# Patient Record
Sex: Female | Born: 1965 | Race: Black or African American | Hispanic: No | Marital: Single | Smoking: Former smoker
Health system: Southern US, Community
[De-identification: ages and names within clinical notes are randomized; demographics above are authoritative.]

## PROBLEM LIST (undated history)

## (undated) ENCOUNTER — Emergency Department (HOSPITAL_BASED_OUTPATIENT_CLINIC_OR_DEPARTMENT_OTHER): Admission: EM | Payer: Medicaid Other | Source: Home / Self Care

## (undated) DIAGNOSIS — K5909 Other constipation: Secondary | ICD-10-CM

## (undated) DIAGNOSIS — G5603 Carpal tunnel syndrome, bilateral upper limbs: Secondary | ICD-10-CM

## (undated) DIAGNOSIS — I10 Essential (primary) hypertension: Secondary | ICD-10-CM

## (undated) DIAGNOSIS — Z87891 Personal history of nicotine dependence: Secondary | ICD-10-CM

## (undated) DIAGNOSIS — L709 Acne, unspecified: Secondary | ICD-10-CM

## (undated) DIAGNOSIS — Z7251 High risk heterosexual behavior: Secondary | ICD-10-CM

## (undated) DIAGNOSIS — L0292 Furuncle, unspecified: Secondary | ICD-10-CM

## (undated) DIAGNOSIS — K219 Gastro-esophageal reflux disease without esophagitis: Secondary | ICD-10-CM

## (undated) DIAGNOSIS — N760 Acute vaginitis: Secondary | ICD-10-CM

## (undated) DIAGNOSIS — N390 Urinary tract infection, site not specified: Secondary | ICD-10-CM

## (undated) DIAGNOSIS — L0293 Carbuncle, unspecified: Secondary | ICD-10-CM

## (undated) DIAGNOSIS — F32A Depression, unspecified: Secondary | ICD-10-CM

## (undated) DIAGNOSIS — B9689 Other specified bacterial agents as the cause of diseases classified elsewhere: Secondary | ICD-10-CM

## (undated) DIAGNOSIS — E785 Hyperlipidemia, unspecified: Secondary | ICD-10-CM

## (undated) DIAGNOSIS — K644 Residual hemorrhoidal skin tags: Secondary | ICD-10-CM

## (undated) DIAGNOSIS — F1411 Cocaine abuse, in remission: Secondary | ICD-10-CM

## (undated) HISTORY — PX: TUBAL LIGATION: SHX77

## (undated) HISTORY — DX: Urinary tract infection, site not specified: N39.0

## (undated) HISTORY — DX: Depression, unspecified: F32.A

## (undated) HISTORY — DX: Cocaine abuse, in remission: F14.11

## (undated) HISTORY — DX: Residual hemorrhoidal skin tags: K64.4

## (undated) HISTORY — DX: Furuncle, unspecified: L02.92

## (undated) HISTORY — DX: Hyperlipidemia, unspecified: E78.5

## (undated) HISTORY — DX: Personal history of nicotine dependence: Z87.891

## (undated) HISTORY — DX: Carbuncle, unspecified: L02.93

## (undated) HISTORY — DX: Other specified bacterial agents as the cause of diseases classified elsewhere: B96.89

## (undated) HISTORY — DX: Carpal tunnel syndrome, bilateral upper limbs: G56.03

## (undated) HISTORY — PX: KNEE ARTHROSCOPY: SHX127

## (undated) HISTORY — PX: COLONOSCOPY: SHX174

## (undated) HISTORY — DX: Other specified bacterial agents as the cause of diseases classified elsewhere: N76.0

## (undated) HISTORY — DX: Other constipation: K59.09

## (undated) HISTORY — DX: High risk heterosexual behavior: Z72.51

## (undated) HISTORY — DX: Essential (primary) hypertension: I10

## (undated) HISTORY — DX: Acne, unspecified: L70.9

---

## 1990-01-15 DIAGNOSIS — F1411 Cocaine abuse, in remission: Secondary | ICD-10-CM

## 1990-01-15 HISTORY — DX: Cocaine abuse, in remission: F14.11

## 1997-01-15 DIAGNOSIS — Z87891 Personal history of nicotine dependence: Secondary | ICD-10-CM

## 1997-01-15 HISTORY — DX: Personal history of nicotine dependence: Z87.891

## 1997-06-01 ENCOUNTER — Ambulatory Visit (HOSPITAL_COMMUNITY): Admission: RE | Admit: 1997-06-01 | Discharge: 1997-06-01 | Payer: Self-pay | Admitting: Family Medicine

## 1997-09-06 ENCOUNTER — Ambulatory Visit (HOSPITAL_COMMUNITY): Admission: RE | Admit: 1997-09-06 | Discharge: 1997-09-06 | Payer: Self-pay | Admitting: Gynecology

## 1997-09-17 ENCOUNTER — Ambulatory Visit (HOSPITAL_COMMUNITY): Admission: RE | Admit: 1997-09-17 | Discharge: 1997-09-17 | Payer: Self-pay | Admitting: Gynecology

## 1997-10-14 ENCOUNTER — Ambulatory Visit (HOSPITAL_COMMUNITY): Admission: RE | Admit: 1997-10-14 | Discharge: 1997-10-14 | Payer: Self-pay | Admitting: Family Medicine

## 1998-01-05 ENCOUNTER — Ambulatory Visit (HOSPITAL_COMMUNITY): Admission: RE | Admit: 1998-01-05 | Discharge: 1998-01-05 | Payer: Self-pay | Admitting: Family Medicine

## 1998-01-10 ENCOUNTER — Emergency Department (HOSPITAL_COMMUNITY): Admission: EM | Admit: 1998-01-10 | Discharge: 1998-01-10 | Payer: Self-pay | Admitting: Emergency Medicine

## 1998-02-10 ENCOUNTER — Encounter: Payer: Self-pay | Admitting: Family Medicine

## 1998-02-10 ENCOUNTER — Ambulatory Visit (HOSPITAL_COMMUNITY): Admission: RE | Admit: 1998-02-10 | Discharge: 1998-02-10 | Payer: Self-pay | Admitting: Family Medicine

## 1998-02-24 ENCOUNTER — Ambulatory Visit (HOSPITAL_BASED_OUTPATIENT_CLINIC_OR_DEPARTMENT_OTHER): Admission: RE | Admit: 1998-02-24 | Discharge: 1998-02-24 | Payer: Self-pay | Admitting: *Deleted

## 1999-01-25 ENCOUNTER — Encounter: Payer: Self-pay | Admitting: Emergency Medicine

## 1999-01-25 ENCOUNTER — Emergency Department (HOSPITAL_COMMUNITY): Admission: EM | Admit: 1999-01-25 | Discharge: 1999-01-25 | Payer: Self-pay | Admitting: Emergency Medicine

## 1999-05-15 ENCOUNTER — Other Ambulatory Visit: Admission: RE | Admit: 1999-05-15 | Discharge: 1999-05-15 | Payer: Self-pay | Admitting: Family Medicine

## 2001-04-07 ENCOUNTER — Emergency Department (HOSPITAL_COMMUNITY): Admission: EM | Admit: 2001-04-07 | Discharge: 2001-04-08 | Payer: Self-pay | Admitting: Emergency Medicine

## 2001-04-08 ENCOUNTER — Emergency Department (HOSPITAL_COMMUNITY): Admission: EM | Admit: 2001-04-08 | Discharge: 2001-04-09 | Payer: Self-pay | Admitting: *Deleted

## 2001-07-14 ENCOUNTER — Other Ambulatory Visit: Admission: RE | Admit: 2001-07-14 | Discharge: 2001-07-14 | Payer: Self-pay | Admitting: Family Medicine

## 2002-02-21 ENCOUNTER — Emergency Department (HOSPITAL_COMMUNITY): Admission: EM | Admit: 2002-02-21 | Discharge: 2002-02-21 | Payer: Self-pay | Admitting: Emergency Medicine

## 2002-02-22 ENCOUNTER — Emergency Department (HOSPITAL_COMMUNITY): Admission: EM | Admit: 2002-02-22 | Discharge: 2002-02-22 | Payer: Self-pay | Admitting: Emergency Medicine

## 2003-02-24 ENCOUNTER — Ambulatory Visit (HOSPITAL_COMMUNITY): Admission: RE | Admit: 2003-02-24 | Discharge: 2003-02-24 | Payer: Self-pay | Admitting: Family Medicine

## 2003-10-13 ENCOUNTER — Ambulatory Visit: Payer: Self-pay | Admitting: Family Medicine

## 2003-10-20 ENCOUNTER — Ambulatory Visit (HOSPITAL_COMMUNITY): Admission: RE | Admit: 2003-10-20 | Discharge: 2003-10-20 | Payer: Self-pay | Admitting: Family Medicine

## 2003-11-01 ENCOUNTER — Ambulatory Visit: Payer: Self-pay | Admitting: Family Medicine

## 2004-01-19 ENCOUNTER — Ambulatory Visit: Payer: Self-pay | Admitting: Internal Medicine

## 2004-01-21 ENCOUNTER — Ambulatory Visit: Payer: Self-pay | Admitting: Internal Medicine

## 2004-01-29 ENCOUNTER — Emergency Department (HOSPITAL_COMMUNITY): Admission: EM | Admit: 2004-01-29 | Discharge: 2004-01-29 | Payer: Self-pay | Admitting: Emergency Medicine

## 2004-02-16 ENCOUNTER — Emergency Department (HOSPITAL_COMMUNITY): Admission: EM | Admit: 2004-02-16 | Discharge: 2004-02-16 | Payer: Self-pay | Admitting: Family Medicine

## 2004-03-30 ENCOUNTER — Emergency Department (HOSPITAL_COMMUNITY): Admission: EM | Admit: 2004-03-30 | Discharge: 2004-03-30 | Payer: Self-pay | Admitting: Family Medicine

## 2004-04-11 ENCOUNTER — Ambulatory Visit: Payer: Self-pay | Admitting: Internal Medicine

## 2004-05-05 ENCOUNTER — Emergency Department (HOSPITAL_COMMUNITY): Admission: EM | Admit: 2004-05-05 | Discharge: 2004-05-05 | Payer: Self-pay | Admitting: Family Medicine

## 2004-06-06 ENCOUNTER — Ambulatory Visit: Payer: Self-pay | Admitting: Internal Medicine

## 2004-07-03 ENCOUNTER — Ambulatory Visit: Payer: Self-pay | Admitting: Internal Medicine

## 2004-09-25 ENCOUNTER — Ambulatory Visit: Payer: Self-pay | Admitting: Internal Medicine

## 2004-10-02 ENCOUNTER — Ambulatory Visit: Payer: Self-pay | Admitting: Internal Medicine

## 2004-12-18 ENCOUNTER — Ambulatory Visit: Payer: Self-pay | Admitting: Internal Medicine

## 2005-02-15 ENCOUNTER — Emergency Department (HOSPITAL_COMMUNITY): Admission: EM | Admit: 2005-02-15 | Discharge: 2005-02-15 | Payer: Self-pay | Admitting: Family Medicine

## 2005-02-26 ENCOUNTER — Ambulatory Visit: Payer: Self-pay | Admitting: Internal Medicine

## 2005-03-06 ENCOUNTER — Ambulatory Visit: Payer: Self-pay | Admitting: Internal Medicine

## 2005-03-19 ENCOUNTER — Ambulatory Visit: Payer: Self-pay | Admitting: Internal Medicine

## 2005-03-27 ENCOUNTER — Ambulatory Visit (HOSPITAL_COMMUNITY): Admission: RE | Admit: 2005-03-27 | Discharge: 2005-03-27 | Payer: Self-pay | Admitting: Internal Medicine

## 2005-04-04 ENCOUNTER — Ambulatory Visit: Payer: Self-pay | Admitting: Hospitalist

## 2005-05-16 ENCOUNTER — Encounter: Payer: Self-pay | Admitting: Gynecology

## 2005-05-17 ENCOUNTER — Ambulatory Visit: Payer: Self-pay | Admitting: Internal Medicine

## 2005-06-19 ENCOUNTER — Ambulatory Visit: Payer: Self-pay | Admitting: Internal Medicine

## 2005-07-16 ENCOUNTER — Ambulatory Visit: Payer: Self-pay | Admitting: Internal Medicine

## 2005-07-27 ENCOUNTER — Ambulatory Visit: Payer: Self-pay | Admitting: Hospitalist

## 2005-07-27 ENCOUNTER — Ambulatory Visit (HOSPITAL_COMMUNITY): Admission: RE | Admit: 2005-07-27 | Discharge: 2005-07-27 | Payer: Self-pay | Admitting: Hospitalist

## 2005-07-30 ENCOUNTER — Ambulatory Visit: Payer: Self-pay

## 2005-08-06 ENCOUNTER — Ambulatory Visit: Payer: Self-pay | Admitting: Internal Medicine

## 2005-08-17 ENCOUNTER — Ambulatory Visit: Payer: Self-pay | Admitting: Internal Medicine

## 2005-08-31 ENCOUNTER — Ambulatory Visit: Payer: Self-pay | Admitting: Internal Medicine

## 2005-09-13 ENCOUNTER — Ambulatory Visit: Payer: Self-pay | Admitting: Internal Medicine

## 2005-10-18 ENCOUNTER — Ambulatory Visit: Payer: Self-pay | Admitting: Internal Medicine

## 2005-12-18 ENCOUNTER — Ambulatory Visit: Payer: Self-pay | Admitting: Internal Medicine

## 2005-12-26 ENCOUNTER — Ambulatory Visit: Payer: Self-pay | Admitting: *Deleted

## 2006-01-02 DIAGNOSIS — N390 Urinary tract infection, site not specified: Secondary | ICD-10-CM | POA: Insufficient documentation

## 2006-01-02 DIAGNOSIS — J309 Allergic rhinitis, unspecified: Secondary | ICD-10-CM | POA: Insufficient documentation

## 2006-01-02 DIAGNOSIS — Z8619 Personal history of other infectious and parasitic diseases: Secondary | ICD-10-CM

## 2006-01-02 HISTORY — DX: Allergic rhinitis, unspecified: J30.9

## 2006-01-02 HISTORY — DX: Personal history of other infectious and parasitic diseases: Z86.19

## 2006-01-11 ENCOUNTER — Ambulatory Visit: Payer: Self-pay | Admitting: Specialist

## 2006-01-16 ENCOUNTER — Encounter (INDEPENDENT_AMBULATORY_CARE_PROVIDER_SITE_OTHER): Payer: Self-pay | Admitting: *Deleted

## 2006-03-27 ENCOUNTER — Ambulatory Visit: Payer: Self-pay | Admitting: *Deleted

## 2006-03-27 ENCOUNTER — Encounter (INDEPENDENT_AMBULATORY_CARE_PROVIDER_SITE_OTHER): Payer: Self-pay | Admitting: *Deleted

## 2006-04-10 ENCOUNTER — Ambulatory Visit: Payer: Self-pay | Admitting: Internal Medicine

## 2006-04-10 ENCOUNTER — Encounter (INDEPENDENT_AMBULATORY_CARE_PROVIDER_SITE_OTHER): Payer: Self-pay | Admitting: *Deleted

## 2006-04-10 LAB — CONVERTED CEMR LAB: Total CHOL/HDL Ratio: 3.2

## 2006-04-23 ENCOUNTER — Ambulatory Visit (HOSPITAL_COMMUNITY): Admission: RE | Admit: 2006-04-23 | Discharge: 2006-04-23 | Payer: Self-pay | Admitting: Internal Medicine

## 2006-05-08 ENCOUNTER — Encounter (INDEPENDENT_AMBULATORY_CARE_PROVIDER_SITE_OTHER): Payer: Self-pay | Admitting: *Deleted

## 2006-05-08 ENCOUNTER — Ambulatory Visit: Payer: Self-pay | Admitting: Internal Medicine

## 2006-05-08 LAB — CONVERTED CEMR LAB
Chlamydia, DNA Probe: NEGATIVE
GC Probe Amp, Genital: NEGATIVE

## 2006-05-09 ENCOUNTER — Encounter (INDEPENDENT_AMBULATORY_CARE_PROVIDER_SITE_OTHER): Payer: Self-pay | Admitting: *Deleted

## 2006-05-10 LAB — CONVERTED CEMR LAB
Candida species: POSITIVE — AB
Gardnerella vaginalis: NEGATIVE

## 2006-05-15 ENCOUNTER — Encounter (INDEPENDENT_AMBULATORY_CARE_PROVIDER_SITE_OTHER): Payer: Self-pay | Admitting: Internal Medicine

## 2006-05-15 ENCOUNTER — Ambulatory Visit: Payer: Self-pay | Admitting: *Deleted

## 2006-05-15 LAB — CONVERTED CEMR LAB
AST: 16 units/L (ref 0–37)
Albumin: 4.1 g/dL (ref 3.5–5.2)
Alkaline Phosphatase: 65 units/L (ref 39–117)
Total Protein: 7.1 g/dL (ref 6.0–8.3)

## 2006-06-06 ENCOUNTER — Telehealth (INDEPENDENT_AMBULATORY_CARE_PROVIDER_SITE_OTHER): Payer: Self-pay | Admitting: *Deleted

## 2006-06-21 ENCOUNTER — Encounter (INDEPENDENT_AMBULATORY_CARE_PROVIDER_SITE_OTHER): Payer: Self-pay | Admitting: *Deleted

## 2006-06-21 ENCOUNTER — Ambulatory Visit: Payer: Self-pay | Admitting: Internal Medicine

## 2006-07-10 ENCOUNTER — Encounter (INDEPENDENT_AMBULATORY_CARE_PROVIDER_SITE_OTHER): Payer: Self-pay | Admitting: *Deleted

## 2006-07-10 ENCOUNTER — Ambulatory Visit: Payer: Self-pay | Admitting: Internal Medicine

## 2006-07-11 LAB — CONVERTED CEMR LAB
Ketones, ur: NEGATIVE mg/dL
Nitrite: NEGATIVE
Specific Gravity, Urine: 1.017 (ref 1.005–1.03)
Urobilinogen, UA: 0.2 (ref 0.0–1.0)

## 2006-09-10 ENCOUNTER — Ambulatory Visit: Payer: Self-pay | Admitting: Internal Medicine

## 2006-09-10 LAB — CONVERTED CEMR LAB
GC Probe Amp, Genital: NEGATIVE
Gardnerella vaginalis: NEGATIVE
Trichomonal Vaginitis: POSITIVE — AB

## 2006-09-24 ENCOUNTER — Ambulatory Visit: Payer: Self-pay | Admitting: Internal Medicine

## 2006-09-24 ENCOUNTER — Encounter (INDEPENDENT_AMBULATORY_CARE_PROVIDER_SITE_OTHER): Payer: Self-pay | Admitting: *Deleted

## 2006-09-24 LAB — CONVERTED CEMR LAB: Candida species: NEGATIVE

## 2006-12-03 ENCOUNTER — Encounter (INDEPENDENT_AMBULATORY_CARE_PROVIDER_SITE_OTHER): Payer: Self-pay | Admitting: Internal Medicine

## 2006-12-03 ENCOUNTER — Ambulatory Visit: Payer: Self-pay | Admitting: Hospitalist

## 2006-12-03 LAB — CONVERTED CEMR LAB
Blood in Urine, dipstick: NEGATIVE
Chlamydia, Swab/Urine, PCR: NEGATIVE
GC Probe Amp, Urine: NEGATIVE
Ketones, urine, test strip: NEGATIVE
Nitrite: NEGATIVE
Urobilinogen, UA: 0.2

## 2006-12-30 ENCOUNTER — Ambulatory Visit: Payer: Self-pay | Admitting: Internal Medicine

## 2006-12-30 ENCOUNTER — Encounter (INDEPENDENT_AMBULATORY_CARE_PROVIDER_SITE_OTHER): Payer: Self-pay | Admitting: *Deleted

## 2006-12-31 ENCOUNTER — Encounter: Payer: Self-pay | Admitting: Internal Medicine

## 2006-12-31 ENCOUNTER — Encounter: Payer: Self-pay | Admitting: Infectious Diseases

## 2006-12-31 ENCOUNTER — Encounter (INDEPENDENT_AMBULATORY_CARE_PROVIDER_SITE_OTHER): Payer: Self-pay | Admitting: *Deleted

## 2006-12-31 LAB — CONVERTED CEMR LAB
ALT: <8 units/L (ref 0–35)
CO2: 22 meq/L (ref 19–32)
Creatinine, Ser: 0.63 mg/dL (ref 0.40–1.20)
GC Probe Amp, Genital: NEGATIVE
Gardnerella vaginalis: NEGATIVE
HCV Ab: NEGATIVE
Hep B S Ab: NEGATIVE
Hepatitis B Surface Ag: NEGATIVE
Total Bilirubin: 0.3 mg/dL (ref 0.3–1.2)
Trichomonal Vaginitis: NEGATIVE

## 2007-01-03 ENCOUNTER — Ambulatory Visit: Payer: Self-pay | Admitting: Internal Medicine

## 2007-01-13 ENCOUNTER — Ambulatory Visit: Payer: Self-pay | Admitting: Internal Medicine

## 2007-01-13 DIAGNOSIS — I10 Essential (primary) hypertension: Secondary | ICD-10-CM | POA: Insufficient documentation

## 2007-01-21 ENCOUNTER — Ambulatory Visit: Payer: Self-pay | Admitting: Internal Medicine

## 2007-01-21 ENCOUNTER — Encounter (INDEPENDENT_AMBULATORY_CARE_PROVIDER_SITE_OTHER): Payer: Self-pay | Admitting: *Deleted

## 2007-01-21 DIAGNOSIS — K5909 Other constipation: Secondary | ICD-10-CM | POA: Insufficient documentation

## 2007-01-21 LAB — CONVERTED CEMR LAB
BUN: 14 mg/dL (ref 6–23)
CO2: 21 meq/L (ref 19–32)
Calcium: 9.9 mg/dL (ref 8.4–10.5)
Creatinine, Ser: 0.73 mg/dL (ref 0.40–1.20)
Glucose, Bld: 100 mg/dL — ABNORMAL HIGH (ref 70–99)

## 2007-02-28 ENCOUNTER — Ambulatory Visit: Payer: Self-pay | Admitting: Internal Medicine

## 2007-02-28 ENCOUNTER — Encounter (INDEPENDENT_AMBULATORY_CARE_PROVIDER_SITE_OTHER): Payer: Self-pay | Admitting: *Deleted

## 2007-02-28 DIAGNOSIS — L253 Unspecified contact dermatitis due to other chemical products: Secondary | ICD-10-CM | POA: Insufficient documentation

## 2007-03-01 ENCOUNTER — Encounter (INDEPENDENT_AMBULATORY_CARE_PROVIDER_SITE_OTHER): Payer: Self-pay | Admitting: *Deleted

## 2007-03-11 ENCOUNTER — Telehealth (INDEPENDENT_AMBULATORY_CARE_PROVIDER_SITE_OTHER): Payer: Self-pay | Admitting: *Deleted

## 2007-03-14 ENCOUNTER — Ambulatory Visit: Payer: Self-pay | Admitting: Internal Medicine

## 2007-03-14 ENCOUNTER — Encounter (INDEPENDENT_AMBULATORY_CARE_PROVIDER_SITE_OTHER): Payer: Self-pay | Admitting: *Deleted

## 2007-03-14 LAB — CONVERTED CEMR LAB
Bilirubin Urine: NEGATIVE
Glucose, Urine, Semiquant: NEGATIVE
Specific Gravity, Urine: 1.015
Urobilinogen, UA: 0.2
pH: 5.5

## 2007-03-31 ENCOUNTER — Encounter (INDEPENDENT_AMBULATORY_CARE_PROVIDER_SITE_OTHER): Payer: Self-pay | Admitting: *Deleted

## 2007-03-31 ENCOUNTER — Ambulatory Visit: Payer: Self-pay | Admitting: Hospitalist

## 2007-04-01 ENCOUNTER — Encounter (INDEPENDENT_AMBULATORY_CARE_PROVIDER_SITE_OTHER): Payer: Self-pay | Admitting: *Deleted

## 2007-04-01 LAB — CONVERTED CEMR LAB
Bilirubin Urine: NEGATIVE
Hemoglobin, Urine: NEGATIVE
Ketones, ur: NEGATIVE mg/dL
Leukocytes, UA: NEGATIVE
Nitrite: NEGATIVE
Protein, ur: NEGATIVE mg/dL
Urine Glucose: NEGATIVE mg/dL

## 2007-04-02 ENCOUNTER — Encounter (INDEPENDENT_AMBULATORY_CARE_PROVIDER_SITE_OTHER): Payer: Self-pay | Admitting: *Deleted

## 2007-04-07 ENCOUNTER — Encounter: Admission: RE | Admit: 2007-04-07 | Discharge: 2007-04-07 | Payer: Self-pay | Admitting: Gastroenterology

## 2007-05-07 ENCOUNTER — Ambulatory Visit: Payer: Self-pay | Admitting: Unknown Physician Specialty

## 2007-05-19 ENCOUNTER — Ambulatory Visit: Payer: Self-pay | Admitting: Internal Medicine

## 2007-05-19 ENCOUNTER — Encounter (INDEPENDENT_AMBULATORY_CARE_PROVIDER_SITE_OTHER): Payer: Self-pay | Admitting: Internal Medicine

## 2007-05-19 LAB — CONVERTED CEMR LAB
Ketones, ur: NEGATIVE mg/dL
Leukocytes, UA: NEGATIVE
Nitrite: POSITIVE — AB
RBC / HPF: NONE SEEN (ref <3)
Specific Gravity, Urine: 1.026 (ref 1.005–1.03)
Urobilinogen, UA: 1 (ref 0.0–1.0)
WBC, UA: NONE SEEN cells/hpf (ref <3)

## 2007-05-29 ENCOUNTER — Ambulatory Visit (HOSPITAL_COMMUNITY): Admission: RE | Admit: 2007-05-29 | Discharge: 2007-05-29 | Payer: Self-pay | Admitting: *Deleted

## 2007-05-29 ENCOUNTER — Ambulatory Visit: Payer: Self-pay | Admitting: Vascular Surgery

## 2007-05-29 ENCOUNTER — Encounter (INDEPENDENT_AMBULATORY_CARE_PROVIDER_SITE_OTHER): Payer: Self-pay | Admitting: *Deleted

## 2007-05-29 ENCOUNTER — Encounter (INDEPENDENT_AMBULATORY_CARE_PROVIDER_SITE_OTHER): Payer: Self-pay | Admitting: Internal Medicine

## 2007-05-29 ENCOUNTER — Ambulatory Visit: Payer: Self-pay | Admitting: *Deleted

## 2007-05-29 LAB — CONVERTED CEMR LAB
BUN: 15 mg/dL (ref 6–23)
Chloride: 105 meq/L (ref 96–112)
Creatinine, Ser: 0.74 mg/dL (ref 0.40–1.20)

## 2007-06-24 ENCOUNTER — Encounter (INDEPENDENT_AMBULATORY_CARE_PROVIDER_SITE_OTHER): Payer: Self-pay | Admitting: *Deleted

## 2007-06-24 ENCOUNTER — Ambulatory Visit: Payer: Self-pay | Admitting: *Deleted

## 2007-06-24 LAB — CONVERTED CEMR LAB
AST: 17 units/L (ref 0–37)
Albumin: 4.1 g/dL (ref 3.5–5.2)
Alkaline Phosphatase: 56 units/L (ref 39–117)
BUN: 13 mg/dL (ref 6–23)
Creatinine, Ser: 0.6 mg/dL (ref 0.40–1.20)
Potassium: 3.4 meq/L — ABNORMAL LOW (ref 3.5–5.3)

## 2007-06-25 ENCOUNTER — Encounter (INDEPENDENT_AMBULATORY_CARE_PROVIDER_SITE_OTHER): Payer: Self-pay | Admitting: *Deleted

## 2007-07-01 ENCOUNTER — Telehealth: Payer: Self-pay | Admitting: *Deleted

## 2007-07-01 ENCOUNTER — Emergency Department (HOSPITAL_COMMUNITY): Admission: EM | Admit: 2007-07-01 | Discharge: 2007-07-01 | Payer: Self-pay | Admitting: Emergency Medicine

## 2007-07-01 ENCOUNTER — Encounter (INDEPENDENT_AMBULATORY_CARE_PROVIDER_SITE_OTHER): Payer: Self-pay | Admitting: *Deleted

## 2007-07-30 ENCOUNTER — Telehealth: Payer: Self-pay | Admitting: *Deleted

## 2007-07-30 ENCOUNTER — Ambulatory Visit: Payer: Self-pay | Admitting: Infectious Diseases

## 2007-07-30 ENCOUNTER — Encounter (INDEPENDENT_AMBULATORY_CARE_PROVIDER_SITE_OTHER): Payer: Self-pay | Admitting: Internal Medicine

## 2007-07-30 LAB — CONVERTED CEMR LAB
Beta hcg, urine, semiquantitative: NEGATIVE
Bilirubin Urine: NEGATIVE
Blood in Urine, dipstick: NEGATIVE
GC Probe Amp, Genital: NEGATIVE
Gardnerella vaginalis: NEGATIVE
Glucose, Urine, Semiquant: NEGATIVE
Specific Gravity, Urine: 1.015
Trichomonal Vaginitis: NEGATIVE

## 2007-09-08 ENCOUNTER — Ambulatory Visit: Payer: Self-pay | Admitting: Internal Medicine

## 2007-09-08 ENCOUNTER — Encounter: Payer: Self-pay | Admitting: Internal Medicine

## 2007-09-08 ENCOUNTER — Encounter: Payer: Self-pay | Admitting: *Deleted

## 2007-09-08 LAB — CONVERTED CEMR LAB
Hemoglobin, Urine: NEGATIVE
Ketones, ur: NEGATIVE mg/dL
Nitrite: POSITIVE — AB
Urobilinogen, UA: 1 (ref 0.0–1.0)

## 2007-10-03 ENCOUNTER — Telehealth (INDEPENDENT_AMBULATORY_CARE_PROVIDER_SITE_OTHER): Payer: Self-pay | Admitting: Internal Medicine

## 2007-10-04 ENCOUNTER — Inpatient Hospital Stay (HOSPITAL_COMMUNITY): Admission: AD | Admit: 2007-10-04 | Discharge: 2007-10-04 | Payer: Self-pay | Admitting: Obstetrics & Gynecology

## 2007-10-16 ENCOUNTER — Telehealth: Payer: Self-pay | Admitting: *Deleted

## 2007-10-23 ENCOUNTER — Ambulatory Visit: Payer: Self-pay | Admitting: Infectious Disease

## 2007-10-23 DIAGNOSIS — K644 Residual hemorrhoidal skin tags: Secondary | ICD-10-CM | POA: Insufficient documentation

## 2007-10-24 ENCOUNTER — Ambulatory Visit (HOSPITAL_COMMUNITY): Admission: RE | Admit: 2007-10-24 | Discharge: 2007-10-24 | Payer: Self-pay | Admitting: Internal Medicine

## 2007-10-24 ENCOUNTER — Ambulatory Visit: Payer: Self-pay | Admitting: Vascular Surgery

## 2007-11-06 ENCOUNTER — Ambulatory Visit: Payer: Self-pay | Admitting: Internal Medicine

## 2007-11-06 ENCOUNTER — Encounter: Payer: Self-pay | Admitting: Internal Medicine

## 2007-11-07 LAB — CONVERTED CEMR LAB
Gardnerella vaginalis: NEGATIVE
Trichomonal Vaginitis: NEGATIVE

## 2007-11-09 LAB — CONVERTED CEMR LAB
Ketones, ur: NEGATIVE mg/dL
Nitrite: POSITIVE — AB
Specific Gravity, Urine: 1.025 (ref 1.005–1.03)
Urobilinogen, UA: 1 (ref 0.0–1.0)

## 2007-12-09 ENCOUNTER — Ambulatory Visit: Payer: Self-pay | Admitting: *Deleted

## 2007-12-10 LAB — CONVERTED CEMR LAB
BUN: 11 mg/dL (ref 6–23)
CO2: 24 meq/L (ref 19–32)
Cholesterol: 230 mg/dL — ABNORMAL HIGH (ref 0–200)
Glucose, Bld: 84 mg/dL (ref 70–99)
Potassium: 3.5 meq/L (ref 3.5–5.3)
VLDL: 28 mg/dL (ref 0–40)

## 2008-01-07 ENCOUNTER — Telehealth: Payer: Self-pay | Admitting: Infectious Diseases

## 2008-01-13 ENCOUNTER — Ambulatory Visit (HOSPITAL_COMMUNITY): Admission: RE | Admit: 2008-01-13 | Discharge: 2008-01-13 | Payer: Self-pay | Admitting: Internal Medicine

## 2008-01-21 ENCOUNTER — Ambulatory Visit: Payer: Self-pay | Admitting: Internal Medicine

## 2008-01-21 ENCOUNTER — Encounter: Payer: Self-pay | Admitting: Internal Medicine

## 2008-01-21 LAB — CONVERTED CEMR LAB
BUN: 12 mg/dL (ref 6–23)
Chloride: 100 meq/L (ref 96–112)
Potassium: 3.6 meq/L (ref 3.5–5.3)
Sodium: 138 meq/L (ref 135–145)

## 2008-02-04 ENCOUNTER — Ambulatory Visit: Payer: Self-pay | Admitting: Internal Medicine

## 2008-03-09 ENCOUNTER — Telehealth: Payer: Self-pay | Admitting: *Deleted

## 2008-04-13 ENCOUNTER — Encounter (INDEPENDENT_AMBULATORY_CARE_PROVIDER_SITE_OTHER): Payer: Self-pay | Admitting: Internal Medicine

## 2008-04-13 ENCOUNTER — Ambulatory Visit: Payer: Self-pay | Admitting: Internal Medicine

## 2008-04-13 ENCOUNTER — Encounter (INDEPENDENT_AMBULATORY_CARE_PROVIDER_SITE_OTHER): Payer: Self-pay | Admitting: *Deleted

## 2008-04-13 DIAGNOSIS — L658 Other specified nonscarring hair loss: Secondary | ICD-10-CM | POA: Insufficient documentation

## 2008-04-13 LAB — CONVERTED CEMR LAB
Albumin: 4.7 g/dL (ref 3.5–5.2)
BUN: 14 mg/dL (ref 6–23)
Basophils Absolute: 0 10*3/uL (ref 0.0–0.1)
CO2: 24 meq/L (ref 19–32)
Calcium: 10 mg/dL (ref 8.4–10.5)
Chloride: 102 meq/L (ref 96–112)
Free T4: 0.84 ng/dL — ABNORMAL LOW (ref 0.89–1.80)
GFR calc Af Amer: >60 mL/min (ref >60)
GFR calc non Af Amer: >60 mL/min (ref >60)
Glucose, Bld: 78 mg/dL (ref 70–99)
Lymphocytes Relative: 27 % (ref 12–46)
Lymphs Abs: 1.8 10*3/uL (ref 0.7–4.0)
Neutrophils Relative %: 67 % (ref 43–77)
Platelets: 259 10*3/uL (ref 150–400)
Potassium: 4 meq/L (ref 3.5–5.3)
RDW: 12.9 % (ref 11.5–15.5)
TSH: 0.553 microintl units/mL (ref 0.350–4.500)
Total Protein: 7.8 g/dL (ref 6.0–8.3)
WBC: 6.8 10*3/uL (ref 4.0–10.5)

## 2008-05-16 DIAGNOSIS — L0292 Furuncle, unspecified: Secondary | ICD-10-CM | POA: Insufficient documentation

## 2008-05-16 DIAGNOSIS — L0293 Carbuncle, unspecified: Secondary | ICD-10-CM

## 2008-05-20 ENCOUNTER — Encounter (INDEPENDENT_AMBULATORY_CARE_PROVIDER_SITE_OTHER): Payer: Self-pay | Admitting: *Deleted

## 2008-05-20 ENCOUNTER — Ambulatory Visit: Payer: Self-pay | Admitting: Internal Medicine

## 2008-05-21 ENCOUNTER — Encounter: Payer: Self-pay | Admitting: Internal Medicine

## 2008-06-02 ENCOUNTER — Ambulatory Visit: Payer: Self-pay | Admitting: Infectious Disease

## 2008-06-07 ENCOUNTER — Telehealth: Payer: Self-pay | Admitting: Internal Medicine

## 2008-06-11 ENCOUNTER — Ambulatory Visit: Payer: Self-pay | Admitting: *Deleted

## 2008-06-15 ENCOUNTER — Telehealth (INDEPENDENT_AMBULATORY_CARE_PROVIDER_SITE_OTHER): Payer: Self-pay | Admitting: *Deleted

## 2008-06-18 ENCOUNTER — Ambulatory Visit: Payer: Self-pay | Admitting: Internal Medicine

## 2008-07-20 ENCOUNTER — Ambulatory Visit: Payer: Self-pay | Admitting: Infectious Diseases

## 2008-07-20 ENCOUNTER — Encounter: Payer: Self-pay | Admitting: Internal Medicine

## 2008-07-20 LAB — CONVERTED CEMR LAB
Albumin: 4.2 g/dL (ref 3.5–5.2)
Alkaline Phosphatase: 65 units/L (ref 39–117)
BUN: 12 mg/dL (ref 6–23)
CO2: 23 meq/L (ref 19–32)
Glucose, Bld: 99 mg/dL (ref 70–99)
Potassium: 4.4 meq/L (ref 3.5–5.3)
Sodium: 138 meq/L (ref 135–145)
Total Bilirubin: 0.3 mg/dL (ref 0.3–1.2)
Total Protein: 7.2 g/dL (ref 6.0–8.3)

## 2008-08-05 ENCOUNTER — Ambulatory Visit: Payer: Self-pay | Admitting: Infectious Diseases

## 2008-08-30 ENCOUNTER — Telehealth (INDEPENDENT_AMBULATORY_CARE_PROVIDER_SITE_OTHER): Payer: Self-pay | Admitting: Internal Medicine

## 2008-08-31 ENCOUNTER — Ambulatory Visit: Payer: Self-pay | Admitting: Internal Medicine

## 2008-09-09 ENCOUNTER — Telehealth: Payer: Self-pay | Admitting: Internal Medicine

## 2008-09-10 ENCOUNTER — Ambulatory Visit: Payer: Self-pay | Admitting: Internal Medicine

## 2008-09-10 LAB — CONVERTED CEMR LAB
Bilirubin Urine: NEGATIVE
Leukocytes, UA: NEGATIVE
Protein, ur: NEGATIVE mg/dL
Specific Gravity, Urine: 1.021 (ref 1.005–1.0)
Urobilinogen, UA: 1 (ref 0.0–1.0)

## 2008-09-21 ENCOUNTER — Telehealth: Payer: Self-pay | Admitting: Internal Medicine

## 2008-10-07 ENCOUNTER — Telehealth: Payer: Self-pay | Admitting: Internal Medicine

## 2008-10-18 ENCOUNTER — Telehealth: Payer: Self-pay | Admitting: *Deleted

## 2008-10-18 ENCOUNTER — Telehealth: Payer: Self-pay | Admitting: Internal Medicine

## 2008-10-20 ENCOUNTER — Ambulatory Visit (HOSPITAL_COMMUNITY): Admission: RE | Admit: 2008-10-20 | Discharge: 2008-10-20 | Payer: Self-pay | Admitting: Internal Medicine

## 2008-10-20 ENCOUNTER — Ambulatory Visit: Payer: Self-pay | Admitting: Internal Medicine

## 2008-10-20 ENCOUNTER — Encounter (INDEPENDENT_AMBULATORY_CARE_PROVIDER_SITE_OTHER): Payer: Self-pay | Admitting: Internal Medicine

## 2008-10-21 ENCOUNTER — Telehealth (INDEPENDENT_AMBULATORY_CARE_PROVIDER_SITE_OTHER): Payer: Self-pay | Admitting: Internal Medicine

## 2008-10-21 LAB — CONVERTED CEMR LAB
Chlamydia, DNA Probe: NEGATIVE
GC Probe Amp, Genital: NEGATIVE

## 2008-11-08 ENCOUNTER — Ambulatory Visit: Payer: Self-pay | Admitting: Internal Medicine

## 2008-12-28 ENCOUNTER — Telehealth: Payer: Self-pay | Admitting: Internal Medicine

## 2009-01-18 ENCOUNTER — Ambulatory Visit: Payer: Self-pay | Admitting: Internal Medicine

## 2009-01-18 LAB — CONVERTED CEMR LAB: Gardnerella vaginalis: POSITIVE — AB

## 2009-02-10 ENCOUNTER — Ambulatory Visit: Payer: Self-pay

## 2009-02-10 ENCOUNTER — Telehealth: Payer: Self-pay | Admitting: Internal Medicine

## 2009-02-10 LAB — CONVERTED CEMR LAB
Bilirubin Urine: NEGATIVE
Blood in Urine, dipstick: NEGATIVE
Glucose, Urine, Semiquant: NEGATIVE
Ketones, urine, test strip: NEGATIVE
Protein, U semiquant: NEGATIVE
pH: 6

## 2009-02-11 ENCOUNTER — Ambulatory Visit (HOSPITAL_COMMUNITY): Admission: RE | Admit: 2009-02-11 | Discharge: 2009-02-11 | Payer: Self-pay | Admitting: Internal Medicine

## 2009-02-11 LAB — HM MAMMOGRAPHY: HM Mammogram: NEGATIVE

## 2009-02-22 ENCOUNTER — Ambulatory Visit: Payer: Self-pay | Admitting: Internal Medicine

## 2009-02-22 LAB — CONVERTED CEMR LAB
Bilirubin Urine: NEGATIVE
Glucose, Urine, Semiquant: NEGATIVE
Ketones, urine, test strip: NEGATIVE
Protein, U semiquant: NEGATIVE
Specific Gravity, Urine: 1.02

## 2009-02-23 LAB — CONVERTED CEMR LAB: Candida species: NEGATIVE

## 2009-03-29 ENCOUNTER — Telehealth: Payer: Self-pay | Admitting: Internal Medicine

## 2009-04-06 ENCOUNTER — Encounter: Payer: Self-pay | Admitting: Internal Medicine

## 2009-04-06 ENCOUNTER — Ambulatory Visit: Payer: Self-pay | Admitting: Internal Medicine

## 2009-04-11 ENCOUNTER — Telehealth (INDEPENDENT_AMBULATORY_CARE_PROVIDER_SITE_OTHER): Payer: Self-pay | Admitting: Internal Medicine

## 2009-04-29 ENCOUNTER — Telehealth: Payer: Self-pay | Admitting: Internal Medicine

## 2009-05-15 ENCOUNTER — Emergency Department (HOSPITAL_COMMUNITY): Admission: EM | Admit: 2009-05-15 | Discharge: 2009-05-15 | Payer: Self-pay | Admitting: Emergency Medicine

## 2009-05-16 ENCOUNTER — Telehealth (INDEPENDENT_AMBULATORY_CARE_PROVIDER_SITE_OTHER): Payer: Self-pay | Admitting: *Deleted

## 2009-05-16 ENCOUNTER — Ambulatory Visit: Payer: Self-pay | Admitting: Internal Medicine

## 2009-05-26 ENCOUNTER — Telehealth: Payer: Self-pay | Admitting: Internal Medicine

## 2009-07-21 ENCOUNTER — Emergency Department (HOSPITAL_COMMUNITY): Admission: EM | Admit: 2009-07-21 | Discharge: 2009-07-21 | Payer: Self-pay | Admitting: Family Medicine

## 2009-07-23 ENCOUNTER — Ambulatory Visit: Payer: Self-pay | Admitting: Nurse Practitioner

## 2009-07-23 ENCOUNTER — Inpatient Hospital Stay (HOSPITAL_COMMUNITY): Admission: AD | Admit: 2009-07-23 | Discharge: 2009-07-23 | Payer: Self-pay | Admitting: Obstetrics and Gynecology

## 2009-07-29 ENCOUNTER — Ambulatory Visit: Payer: Self-pay | Admitting: Internal Medicine

## 2009-08-08 ENCOUNTER — Telehealth: Payer: Self-pay | Admitting: *Deleted

## 2009-08-09 ENCOUNTER — Emergency Department (HOSPITAL_COMMUNITY): Admission: EM | Admit: 2009-08-09 | Discharge: 2009-08-09 | Payer: Self-pay | Admitting: Emergency Medicine

## 2009-08-18 ENCOUNTER — Ambulatory Visit: Payer: Self-pay | Admitting: Internal Medicine

## 2009-08-18 LAB — CONVERTED CEMR LAB: Microalb, Ur: 0.89 mg/dL (ref 0.00–1.89)

## 2009-09-14 ENCOUNTER — Telehealth: Payer: Self-pay | Admitting: Internal Medicine

## 2009-09-16 ENCOUNTER — Ambulatory Visit: Payer: Self-pay | Admitting: Internal Medicine

## 2009-09-16 ENCOUNTER — Encounter: Payer: Self-pay | Admitting: Internal Medicine

## 2009-09-16 ENCOUNTER — Telehealth: Payer: Self-pay | Admitting: Internal Medicine

## 2009-09-16 LAB — CONVERTED CEMR LAB
BUN: 8 mg/dL (ref 6–23)
Creatinine, Ser: 0.73 mg/dL (ref 0.40–1.20)
Glucose, Bld: 86 mg/dL (ref 70–99)
Potassium: 3.5 meq/L (ref 3.5–5.3)

## 2009-09-20 ENCOUNTER — Telehealth: Payer: Self-pay | Admitting: Internal Medicine

## 2009-10-06 ENCOUNTER — Ambulatory Visit: Payer: Self-pay | Admitting: Internal Medicine

## 2009-10-06 LAB — CONVERTED CEMR LAB
Bilirubin Urine: NEGATIVE
Bilirubin Urine: NEGATIVE
Blood in Urine, dipstick: NEGATIVE
Glucose, Urine, Semiquant: NEGATIVE
Ketones, ur: NEGATIVE mg/dL
Ketones, urine, test strip: NEGATIVE
Nitrite: NEGATIVE
Protein, U semiquant: NEGATIVE
Specific Gravity, Urine: 1.015
Specific Gravity, Urine: 1.015 (ref 1.005–1.0)
Urine Glucose: NEGATIVE mg/dL
Urobilinogen, UA: 0.2
WBC Urine, dipstick: NEGATIVE
pH: 7

## 2009-10-13 ENCOUNTER — Encounter: Payer: Self-pay | Admitting: *Deleted

## 2009-11-22 ENCOUNTER — Telehealth: Payer: Self-pay | Admitting: Internal Medicine

## 2009-12-23 ENCOUNTER — Telehealth: Payer: Self-pay | Admitting: Internal Medicine

## 2009-12-23 ENCOUNTER — Ambulatory Visit: Payer: Self-pay

## 2009-12-23 ENCOUNTER — Encounter: Payer: Self-pay | Admitting: Internal Medicine

## 2009-12-23 ENCOUNTER — Ambulatory Visit: Payer: Self-pay | Admitting: Internal Medicine

## 2009-12-23 LAB — CONVERTED CEMR LAB
Glucose, Urine, Semiquant: NEGATIVE
Ketones, urine, test strip: NEGATIVE
Nitrite: POSITIVE
Urobilinogen, UA: 0.2
WBC Urine, dipstick: NEGATIVE
pH: 7

## 2009-12-24 LAB — CONVERTED CEMR LAB
Candida species: NEGATIVE
Gardnerella vaginalis: NEGATIVE

## 2010-02-05 ENCOUNTER — Encounter: Payer: Self-pay | Admitting: *Deleted

## 2010-02-06 ENCOUNTER — Encounter: Payer: Self-pay | Admitting: *Deleted

## 2010-02-16 NOTE — Progress Notes (Signed)
Summary: Refill/gh  Phone Note Refill Request Message from:  Patient on April 29, 2009 10:44 AM  Refills Requested: Medication #1:  FUROSEMIDE 20 MG TABS Take 1 tablet by mouth once a day   Dosage confirmed as above?Dosage Confirmed Initial call taken by: Angelina Ok RN,  April 29, 2009 10:44 AM  Follow-up for Phone Call        Rx completed in Dr. Tiajuana Amass Follow-up by: Jackson Latino MD,  April 29, 2009 1:38 PM    Prescriptions: FUROSEMIDE 20 MG TABS (FUROSEMIDE) Take 1 tablet by mouth once a day  #31 x 12   Entered and Authorized by:   Jackson Latino MD   Signed by:   Jackson Latino MD on 04/29/2009   Method used:   Electronically to        [facility] (retail)       [address]       [place], [place]  [address]       Ph: [phone]       Fax: [phone]   RxID:   [id number]

## 2010-02-16 NOTE — Assessment & Plan Note (Signed)
Summary: ACUTE-FOUL SMELLING DISCHARGE/CFB(YANG)   Vital Signs:  Patient profile:   45 year old female Height:      66.0 inches (167.64 cm) Weight:      192.3 pounds (87.41 kg) BMI:     31.15 Temp:     96.4 degrees F (35.78 degrees C) oral Pulse rate:   84 / minute BP sitting:   118 / 79  (right arm)  Vitals Entered By: Stanton Kidney Ditzler RN (January 18, 2009 9:58 AM) Is Patient Diabetic? No Pain Assessment Patient in pain? no      Nutritional Status BMI of > 30 = obese Nutritional Status Detail appetite good  Have you ever been in a relationship where you felt threatened, hurt or afraid?denies   Does patient need assistance? Functional Status Self care Ambulation Normal Comments Problems again with vag discharge with odor.   Primary Care Provider:  Manning Charity MD   History of Present Illness: This is a  year old woman with past medical history outlined in chart.  She is here today with complaint of vaginal discharge and odor.  She has had this complaint multiple times in the past.  She says that symptoms occur at least every other month.  She has been taking one tablet of bactrim every time she has sex, this has given her frequent yeast infections so she also takes diflucan weekly.  She has one partner and he his not circumcized, his doctor has recently prescribed him a wash to use under his foreskin to help prevent her infections.  She does not douch, uses only unscented tampons and pads, uses only unsented soap- no body washes etc, wears cotton underwear and no underwear at No dysuria, no abd pain, no fevers/chills, no n/v/d.  Depression History:      The patient denies a depressed mood most of the day and a diminished interest in her usual daily activities.         Preventive Screening-Counseling & Management  Alcohol-Tobacco     Alcohol drinks/day: 0     Smoking Status: quit     Year Quit: 1999  Caffeine-Diet-Exercise     Does Patient Exercise: no  Medications  Prior to Update: 1)  Albuterol 90 Mcg/act Aers (Albuterol) .... Inhale 2 Puffs Every 4 - 6 Hours As Needed Shortness of Breatlh 2)  Flexeril 5 Mg Tabs (Cyclobenzaprine Hcl) .... Take One Tablet By Mouth Every 12 Hours As Needed For Pain and Muscle Spasm 3)  Furosemide 20 Mg Tabs (Furosemide) .... Take 1 Tablet By Mouth Once A Day 4)  Klor-Con 20 Meq Pack (Potassium Chloride) .... Take 1 Tablet By Mouth Once A Day 5)  Bacitracin 500 Unit/gm Oint (Bacitracin) .... Apply Three Times Per Day To Your Left Armpit.  Do Not Wash Off. 6)  Diclofenac Sodium 75 Mg Tbec (Diclofenac Sodium) .... Take 1 Tablet By Mouth Twice A Day 7)  Acyclovir 200 Mg Caps (Acyclovir) .... Take 1 Tablet By Mouth Two Times A Day 8)  Fluconazole 150 Mg Tabs (Fluconazole) .... Take 1 Tablet By Mouth Once A Day 9)  Bactrim Ds 800-160 Mg Tabs (Sulfamethoxazole-Trimethoprim) .... One Tab With Sex  Allergies: 1)  ! Pcn 2)  ! Epinephrine 3)  ! * Doxycline 4)  ! * Shellfish  Review of Systems       per hpi  Physical Exam  General:  alert and well-developed.   Genitalia:  normal introitus and no external lesions.  thin white (milky) vaginal discharge.  no cervial lesions.  No pain with exam. Psych:  Oriented X3, memory intact for recent and remote, normally interactive, good eye contact, and not anxious appearing.     Impression & Recommendations:  Problem # 1:  VAGINITIS, BACTERIAL, RECURRENT (ICD-616.10) Most likely BV again.  Will treat with oral flagyl for 10 days and also with flagyl gel before intercourse each time.  Also rec that she and her partner pay attention to hygeine before and after sex.  She will STOP taking bactrim with sex and will use diflucan only when having symptoms of yeast.  The following medications were removed from the medication list:    Bactrim Ds 800-160 Mg Tabs (Sulfamethoxazole-trimethoprim) ..... One tab with sex Her updated medication list for this problem includes:    Metronidazole 500  Mg Tabs (Metronidazole) ..... One tablet two times a day for 10 days.  Orders: T-Wet Prep by [facility] [id number]) T-Chlamydia & GC Probe, Genital ([id number])  Complete Medication List: 1)  Albuterol 90 Mcg/act Aers (Albuterol) .... Inhale 2 puffs every 4 - 6 hours as needed shortness of breatlh 2)  Flexeril 5 Mg Tabs (Cyclobenzaprine hcl) .... Take one tablet by mouth every 12 hours as needed for pain and muscle spasm 3)  Furosemide 20 Mg Tabs (Furosemide) .... Take 1 tablet by mouth once a day 4)  Klor-con 20 Meq Pack (Potassium chloride) .... Take 1 tablet by mouth once a day 5)  Acyclovir 200 Mg Caps (Acyclovir) .... Take 1 tablet by mouth two times a day 6)  Fluconazole 150 Mg Tabs (Fluconazole) .... Take 1 tablet by mouth once a day 7)  Metronidazole 0.75 % Gel (Metronidazole) .... Use one applicator full (37.5mg ) every time you have sex. 8)  Metronidazole 500 Mg Tabs (Metronidazole) .... One tablet two times a day for 10 days.  Patient Instructions: 1)  You had lab work today, we will call you if there is anything that needs to be addressed. 2)  You have a new prescription for metronidazole tablets to take two times a day for 10 days and also a cream to apply before intercourse. 3)  Stop taking bactrim. Prescriptions: METRONIDAZOLE 500 MG TABS (METRONIDAZOLE) One tablet two times a day for 10 days.  #20 x 0   Entered and Authorized by:   Elby Showers MD   Signed by:   Elby Showers MD on 01/18/2009   Method used:   Electronically to        [facility] (retail)       [address]       [place], [place]  [address]       Ph: [phone]       Fax: [phone]   RxID:   [id number] METRONIDAZOLE 0.75 % GEL (METRONIDAZOLE) Use one applicator full (37.5mg ) every time you have sex.  #45g x 3   Entered and Authorized by:   Elby Showers MD   Signed by:   Elby Showers MD on 01/18/2009   Method used:   Electronically to         [facility] (retail)       [address]       [place], [place]  [address]       Ph: [phone]       Fax: [phone]   RxID:   [id number]  Process Orders Check Orders Results:  [facility]: ABN not required for this insurance Tests Sent for requisitioning (January 18, 2009 11:05 AM):     01/18/2009: [facility] -- T-Wet Prep by [facility] [phone] (signed)     01/18/2009: [facility] -- T-Chlamydia & GC Probe, Genital [[id number]] (signed)    Prevention & Chronic Care Immunizations   Influenza vaccine: Pt. refused  (10/20/2008)   Influenza vaccine deferral: Deferred  (07/20/2008)   Influenza vaccine due: 09/15/2008    Tetanus booster: Not documented   Td booster deferral: Deferred  (07/20/2008)    Pneumococcal vaccine: Not documented   Pneumococcal vaccine deferral: Deferred  (07/20/2008)  Other Screening   Pap smear: NEGATIVE FOR INTRAEPITHELIAL LESIONS OR MALIGNANCY.  (10/20/2008)   Pap smear action/deferral: Ordered  (10/20/2008)   Pap smear due: 10/20/2009    Mammogram: ASSESSMENT: Negative - BI-RADS 1^MS DIGITAL SCREENING  (01/13/2008)   Mammogram due: 01/12/2009   Smoking status: quit  (01/18/2009)  Lipids   Total Cholesterol: 230  (12/09/2007)   Lipid panel action/deferral: Deferred   LDL: 142  (12/09/2007)   LDL Direct: Not documented   HDL: 60  (12/09/2007)   Triglycerides: 139  (12/09/2007)  Hypertension   Last Blood Pressure: 118 / 79  (01/18/2009)   Serum creatinine: 0.78  (07/20/2008)   BMP action: Ordered   Serum potassium 4.4  (07/20/2008)  Self-Management Support :   Personal Goals (by the next clinic visit) :      Personal blood pressure goal: 140/90  (10/20/2008)   Patient will work on the following items until the next clinic visit to reach self-care goals:     Medications and monitoring: take my medicines every day, check my blood  pressure, weigh myself weekly  (01/18/2009)     Eating: drink diet soda or water instead of juice or soda, eat more vegetables, use fresh or frozen vegetables, eat baked foods instead of fried foods, eat fruit for snacks and desserts, limit or avoid alcohol  (01/18/2009)     Activity: take a 30 minute walk every day  (01/18/2009)    Hypertension self-management support: Written self-care plan  (10/20/2008)

## 2010-02-16 NOTE — Miscellaneous (Signed)
Summary: med, uti/ hla  Clinical Lists Changes  Medications: Added new medication of NITROFURANTOIN MONOHYD MACRO 100 MG CAPS (NITROFURANTOIN MONOHYD MACRO) take one two times a day for 7 days - Signed Rx of NITROFURANTOIN MONOHYD MACRO 100 MG CAPS (NITROFURANTOIN MONOHYD MACRO) take one two times a day for 7 days;  #14 x 0;  Signed;  Entered by: Zoila Shutter MD;  Authorized by: Zoila Shutter MD;  Method used: Electronically to [facility] [address], [place], [place], [place]  [address], Ph: [phone], Fax: [phone]    Prescriptions: NITROFURANTOIN MONOHYD MACRO 100 MG CAPS (NITROFURANTOIN MONOHYD MACRO) take one two times a day for 7 days  #14 x 0   Entered and Authorized by:   Zoila Shutter MD   Signed by:   Zoila Shutter MD on 10/13/2009   Method used:   Electronically to        [facility] (retail)       [address]       [place], [place]  [address]       Ph: [phone]       Fax: [phone]   RxID:   [id number]  pt informed.Marin Roberts RN  October 13, 2009 5:28 PM

## 2010-02-16 NOTE — Progress Notes (Signed)
Summary: miscarriage/ hla  Phone Note Call from Patient   Caller: Patient Summary of Call: pt calls and ask signs of miscarriage. heavy bldg, cramping, instructed to go to [facility] if this happening, pt verb understanding Initial call taken by: Marin Roberts RN,  August 12, 2009 6:10 PM

## 2010-02-16 NOTE — Progress Notes (Signed)
  Phone Note Outgoing Call   Summary of Call: Called pt to f/u on her boil.  She feels like it is getting some better.  Hasn't gone away yet and still hurts.  Recommended she start the warm compresses b/c she hadn't done that yet.  Will f/u if it hasn't gotton markedly better in 1 week or if it gets worse. Initial call taken by: Joaquin Courts  MD,  April 11, 2009 8:57 AM

## 2010-02-16 NOTE — Assessment & Plan Note (Signed)
Summary: ACUTE-FOUL SMELLING URINE(YANG)/CFB   Vital Signs:  Patient profile:   45 year old female Height:      66.0 inches (167.64 cm) Weight:      189 pounds (85.91 kg) BMI:     30.62 Temp:     97.4 degrees F (36.33 degrees C) oral Pulse rate:   85 / minute BP sitting:   116 / 78  (left arm)  Vitals Entered By: Theotis Barrio NT II (July 29, 2009 11:36 AM) CC: FOLLOW UP FROM LAST OFFICE VISIT( FOWL SMELLING URINE) PATIET STATES SHE HAS TAKEN LAST PILL TODAY/ URINE FINE AT THIS TIME Is Patient Diabetic? No Pain Assessment Patient in pain? no      Nutritional Status BMI of > 30 = obese  Have you ever been in a relationship where you felt threatened, hurt or afraid?No   Does patient need assistance? Functional Status Self care Ambulation Normal Comments PATEINT STATES SHE REALLY DID NOT NEED TO SEE THE DOCTOR TODAY/ WANTS TO SCHEDULE WITH DR Threasa Beards.   Primary Care Provider:  Jackson Latino MD  CC:  FOLLOW UP FROM LAST OFFICE VISIT( FOWL SMELLING URINE) PATIET STATES SHE HAS TAKEN LAST PILL TODAY/ URINE FINE AT THIS TIME.  History of Present Illness: Patient did not want to see a doctor today and said that she will schedule a visit with Dr Threasa Beards if needed. Took her antibiotics and completed her regimen. No new complaints as per nurse.  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 Pcn 2)  ! Epinephrine 3)  ! * Doxycline 4)  ! * Shellfish  Social History: Does Patient Exercise:  yes   Complete Medication List: 1)  Albuterol 90 Mcg/act Aers (Albuterol) .... Inhale 2 puffs every 4 - 6 hours as needed shortness of breatlh 2)  Furosemide 20 Mg Tabs (Furosemide) .... Take 1 tablet by mouth once a day 3)  Klor-con 20 Meq Pack (Potassium chloride) .... Take 1 tablet by  mouth once a day 4)  Acyclovir 200 Mg Caps (Acyclovir) .... Take 1 tablet by mouth two times a day 5)  Fluconazole 150 Mg Tabs (Fluconazole) .... Take 1 tablet by mouth once a day 6)  Cipro 250 Mg Tabs (Ciprofloxacin hcl) .... Take 1/2 tab by  mouth within an hour after sexual relations.   Prevention & Chronic Care Immunizations   Influenza vaccine: Pt. refused  (10/20/2008)   Influenza vaccine deferral: Deferred  (07/20/2008)   Influenza vaccine due: 09/15/2008    Tetanus booster: Not documented   Td booster deferral: Deferred  (07/20/2008)    Pneumococcal vaccine: Not documented   Pneumococcal vaccine deferral: Deferred  (07/20/2008)  Other Screening   Pap smear: NEGATIVE FOR INTRAEPITHELIAL LESIONS OR MALIGNANCY.  (10/20/2008)   Pap smear action/deferral: Ordered  (10/20/2008)   Pap smear due: 10/20/2009    Mammogram: ASSESSMENT: Negative - BI-RADS 1^MS DIGITAL SCREENING  (02/11/2009)   Mammogram due: 01/12/2009   Smoking status: quit  (07/29/2009)  Lipids   Total Cholesterol: 230  (12/09/2007)   Lipid panel action/deferral: Deferred   LDL: 142  (12/09/2007)   LDL Direct: Not documented   HDL: 60  (12/09/2007)   Triglycerides: 139  (12/09/2007)  Hypertension   Last Blood Pressure:  116 / 78  (07/29/2009)   Serum creatinine: 0.78  (07/20/2008)   BMP action: Ordered   Serum potassium 4.4  (07/20/2008)  Self-Management Support :   Personal Goals (by the next clinic visit) :      Personal blood pressure goal: 140/90  (02/22/2009)   Patient will work on the following items until the next clinic visit to reach self-care goals:     Medications and monitoring: take my medicines every day, bring all of my medications to every visit  (07/29/2009)     Eating: drink diet soda or water instead of juice or soda, eat more vegetables, use fresh or frozen vegetables, eat foods that are low in salt, eat baked foods instead of fried foods, eat fruit for snacks and desserts, limit or  avoid alcohol  (07/29/2009)     Activity: take a 30 minute walk every day  (07/29/2009)    Hypertension self-management support: Resources for patients handout  (07/29/2009)      Resource handout printed.

## 2010-02-16 NOTE — Progress Notes (Signed)
Summary: med refill/gp  Phone Note Refill Request Message from:  Fax from Pharmacy on November 22, 2009 11:54 AM  Refills Requested: Medication #1:  CIPRO 250 MG TABS Take 1/2 tab by  mouth within an hour after sexual relations.   Last Refilled: 10/28/2009  Pharmacy requested Cipro 500mg   1 tablet not 250mg  1/2 tablet Thanks   Method Requested: Electronic Initial call taken by: Chinita Pester RN,  November 22, 2009 11:54 AM  Follow-up for Phone Call        Rx changed to 250 mg tabs; 1 full tab.  New rx submitted to pharmacy Follow-up by: Nelda Bucks DO,  November 23, 2009 3:49 PM    New/Updated Medications: CIPRO 250 MG TABS (CIPROFLOXACIN HCL) Take 1 tab by  mouth within an hour after sexual relations. Prescriptions: CIPRO 250 MG TABS (CIPROFLOXACIN HCL) Take 1 tab by  mouth within an hour after sexual relations.  #20 x 0   Entered and Authorized by:   Nelda Bucks DO   Signed by:   Nelda Bucks DO on 11/23/2009   Method used:   Electronically to        [facility] (retail)       [address]       [place], [place]  [address]       Ph: [phone]       Fax: [phone]   RxID:   [id number]

## 2010-02-16 NOTE — Medication Information (Signed)
Summary: NITROFRANTOIN MONOHYD MACRO  NITROFRANTOIN MONOHYD MACRO   Imported By: Margie Billet 12/27/2009 14:40:53  _____________________________________________________________________  External Attachment:    Type:   Image     Comment:   External Document

## 2010-02-16 NOTE — Progress Notes (Signed)
Summary: refill/gg  Phone Note Refill Request  on September 14, 2009 11:43 AM  Refills Requested: Medication #1:  KLOR-CON 20 MEQ PACK Take 1 tablet by mouth once a day   Last Refilled: 08/09/2009 last labs done    7/10   Method Requested: Electronic Initial call taken by: Merrie Roof RN,  September 14, 2009 11:44 AM  Follow-up for Phone Call        Need BMET before refill. I have put  the order of BMET. Follow-up by: Jackson Latino MD,  September 16, 2009 7:11 AM

## 2010-02-16 NOTE — Progress Notes (Signed)
Summary: UTI  Phone Note Call from Patient   Caller: Patient Call For: Jackson Latino MD Summary of Call: Call from pt says that she has foul smelling urine and vaginal discharge.   Pt given an appointment for this am.Gladys Herbin RN  February 10, 2009 9:39 AM   Initial call taken by: Angelina Ok RN,  February 10, 2009 9:39 AM  Follow-up for Phone Call        Agree with appt. Follow-up by: Blanch Media MD,  February 10, 2009 9:47 AM

## 2010-02-16 NOTE — Assessment & Plan Note (Signed)
Summary: acute-boil under arm again(yang)/cfb   Vital Signs:  Patient profile:   45 year old female Height:      66.0 inches (167.64 cm) Weight:      193.5 pounds (87.95 kg) BMI:     31.34 Temp:     97.3 degrees F (36.28 degrees C) oral Pulse rate:   85 / minute BP sitting:   113 / 84  (right arm)  Vitals Entered By: Stanton Kidney Ditzler RN (April 06, 2009 9:42 AM) Is Patient Diabetic? No Pain Assessment Patient in pain? yes     Location: left axillary Intensity: 10+ Type: sharp Onset of pain  past 5 days Nutritional Status BMI of > 30 = obese Nutritional Status Detail appetite good  Have you ever been in a relationship where you felt threatened, hurt or afraid?denies   Does patient need assistance? Functional Status Self care Ambulation Normal Comments Past 5 days - has boil left axillary area.   Primary Care Provider:  Jackson Latino MD   History of Present Illness: Pt is a 45 yo female w/ past med hx below here for boil under her L axilla.  She noteiced about 5 days ago and it has been getting worse.  She notes she shaved and she shouldn't have b/c this often happens after she shaves her underarm.  No fevers/chill, nausea, diarrhea, but the area is quite painful.  No drainage from it despite trying compresses and other OTC agents.    Depression History:      The patient denies a depressed mood most of the day and a diminished interest in her usual daily activities.         Preventive Screening-Counseling & Management  Alcohol-Tobacco     Alcohol drinks/day: 0     Smoking Status: quit     Year Quit: 1999  Caffeine-Diet-Exercise     Does Patient Exercise: no  Current Medications (verified): 1)  Albuterol 90 Mcg/act Aers (Albuterol) .... Inhale 2 Puffs Every 4 - 6 Hours As Needed Shortness of Breatlh 2)  Furosemide 20 Mg Tabs (Furosemide) .... Take 1 Tablet By Mouth Once A Day 3)  Klor-Con 20 Meq Pack (Potassium Chloride) .... Take 1 Tablet By Mouth Once A Day 4)   Acyclovir 200 Mg Caps (Acyclovir) .... Take 1 Tablet By Mouth Two Times A Day 5)  Fluconazole 150 Mg Tabs (Fluconazole) .... Take 1 Tablet By Mouth Once A Day 6)  Ciprofloxacin Hcl 250 Mg Tabs (Ciprofloxacin Hcl) .... Take 1 Tablet By Mouth Two Times A Day For 7 Days.  Allergies: 1)  ! Pcn 2)  ! Epinephrine 3)  ! * Doxycline 4)  ! * Shellfish  Past History:  Past Medical History: Last updated: 11/06/2007 Recurrent/Chronic Cystitis ---->Primary pathogen E. Coli with changing sensitivities- last Cx was 8/09=Ecol:sensitive to Nitrofurantoin, Cefazolin, Ceftriaxone, Gentamycin,Tobramycin only ------> CT Abd/pelvis(7/07):neg -------> Hx of W/U @[facility],[place]:Dr. Reuel Boom Allergic rhinitis Hyperlipidemia High Blood pressure:occasionally                                  Not on any meds Genital herpes:Restarted Valtrex 10/09 Acne H/o Bilateral carpal tunnel syndrome 1/06 Hx of tobacco abuse:Quit 1999 H/o Cocaine abuse: Quit 1992  Social History: Last updated: 12/03/2006 Occupation: has daycare center Single Former Smoker Alcohol use-no Drug use-no  Social History: Reviewed history from 12/03/2006 and no changes required. Occupation: has daycare center Single Former Smoker Alcohol use-no Drug use-no  Review of Systems       as per HPI.  Physical Exam  General:  alert, oriented, well groomed, no distress. Eyes:  anicteric. Lungs:  Normal respiratory effort, chest expands symmetrically. Lungs are clear to auscultation, no crackles or wheezes. Heart:  Normal rate and regular rhythm. S1 and S2 normal without gallop, murmur, click, rub or other extra sounds. Abdomen:  +BS's, soft, NT and ND. Extremities:  no edema. Skin:  2-3 cm abscess in L axilla w/ hair follile at the center that is red and warm and exquisitely TTP.   Cervical Nodes:  No lymphadenopathy noted Axillary Nodes:  No palpable lymphadenopathy Psych:  mood somewhat anxious appearing.     Impression & Recommendations:  Problem # 1:  BOILS, RECURRENT (ICD-680.9) Did I and D today in the office, time out performed, prepped w/ betadine x3, injected 2 cc of lidocaine, then used 11 blade and made 2-3 mm incision w/ drainage of frank purulent material and blood.  Total of about 1/2 cc purulent material expressed.  She tolerated the procedure well.  Cx sent.  Hx of prior MSSA in the past but will cover for MRSA w/ bactrim x 10 days.  Advised warm compresses and avoid shaving the area.  Orders: T-Culture, Wound ([id number]) I&D Abscess, Simple / Single ([id number])  Problem # 2:  UTI'S, RECURRENT (ICD-599.0) Has been doing well w/ cipro prophylaxis so will refill today.       Cipro 250 Mg Tabs (Ciprofloxacin hcl) .Marland Kitchen... Take 1/2 tab by  mouth within an hour after sexual relations.  Complete Medication List: 1)  Albuterol 90 Mcg/act Aers (Albuterol) .... Inhale 2 puffs every 4 - 6 hours as needed shortness of breatlh 2)  Furosemide 20 Mg Tabs (Furosemide) .... Take 1 tablet by mouth once a day 3)  Klor-con 20 Meq Pack (Potassium chloride) .... Take 1 tablet by mouth once a day 4)  Acyclovir 200 Mg Caps (Acyclovir) .... Take 1 tablet by mouth two times a day 5)  Fluconazole 150 Mg Tabs (Fluconazole) .... Take 1 tablet by mouth once a day 6)  Ciprofloxacin Hcl 250 Mg Tabs (Ciprofloxacin hcl) .... Take 1 tablet by mouth two times a day for 7 days. 7)  Bactrim Ds 800-160 Mg Tabs (Sulfamethoxazole-trimethoprim) .... Take two tabs by mouth twice a day until gone. 8)  Cipro 250 Mg Tabs (Ciprofloxacin hcl) .... Take 1/2 tab by  mouth within an hour after sexual relations.  Patient Instructions: 1)  Please make a followup appointment in 3 months. 2)  Call sooner if your boil does not go away. 3)  You can take tylenol for pain. 4)  Please apply warm compresses to the area 3 times a day for 1-2 weeks to help it drain.  Prescriptions: CIPRO 250 MG TABS (CIPROFLOXACIN HCL) Take 1/2 tab  by  mouth within an hour after sexual relations.  #10 x 3   Entered and Authorized by:   Joaquin Courts  MD   Signed by:   Joaquin Courts  MD on 04/06/2009   Method used:   Print then Give to Patient   RxID:   [id number] BACTRIM DS 800-160 MG TABS (SULFAMETHOXAZOLE-TRIMETHOPRIM) TAke two tabs by mouth twice a day until gone.  #40 x 0   Entered and Authorized by:   Joaquin Courts  MD   Signed by:   Joaquin Courts  MD on 04/06/2009   Method used:   Print then Give to Patient   RxID:  [id number]  Process Orders Check Orders Results:     [facility]: ABN not required for this insurance Tests Sent for requisitioning (April 06, 2009 2:21 PM):     04/06/2009: [facility] -- T-Culture, Wound [[id number]] (signed)    Prevention & Chronic Care Immunizations   Influenza vaccine: Pt. refused  (10/20/2008)   Influenza vaccine deferral: Deferred  (07/20/2008)   Influenza vaccine due: 09/15/2008    Tetanus booster: Not documented   Td booster deferral: Deferred  (07/20/2008)    Pneumococcal vaccine: Not documented   Pneumococcal vaccine deferral: Deferred  (07/20/2008)  Other Screening   Pap smear: NEGATIVE FOR INTRAEPITHELIAL LESIONS OR MALIGNANCY.  (10/20/2008)   Pap smear action/deferral: Ordered  (10/20/2008)   Pap smear due: 10/20/2009    Mammogram: ASSESSMENT: Negative - BI-RADS 1^MS DIGITAL SCREENING  (02/11/2009)   Mammogram due: 01/12/2009   Smoking status: quit  (04/06/2009)  Lipids   Total Cholesterol: 230  (12/09/2007)   Lipid panel action/deferral: Deferred   LDL: 142  (12/09/2007)   LDL Direct: Not documented   HDL: 60  (12/09/2007)   Triglycerides: 139  (12/09/2007)  Hypertension   Last Blood Pressure: 113 / 84  (04/06/2009)   Serum creatinine: 0.78  (07/20/2008)   BMP action: Ordered   Serum potassium 4.4  (07/20/2008)    Hypertension flowsheet reviewed?: Yes   Progress toward BP goal: At  goal  Self-Management Support :   Personal Goals (by the next clinic visit) :      Personal blood pressure goal: 140/90  (02/22/2009)   Patient will work on the following items until the next clinic visit to reach self-care goals:     Medications and monitoring: take my medicines every day, check my blood pressure, bring all of my medications to every visit, weigh myself weekly  (04/06/2009)     Eating: drink diet soda or water instead of juice or soda, eat more vegetables, use fresh or frozen vegetables, eat foods that are low in salt, eat fruit for snacks and desserts, limit or avoid alcohol  (04/06/2009)     Activity: take a 30 minute walk every day, park at the far end of the parking lot  (04/06/2009)    Hypertension self-management support: Written self-care plan, Education handout, Resources for patients handout  (04/06/2009)   Hypertension self-care plan printed.   Hypertension education handout printed      Resource handout printed.

## 2010-02-16 NOTE — Progress Notes (Signed)
Summary: jphone/gg  Phone Note Call from Patient   Caller: Patient Summary of Call: Pt called and was seen in Ed at [facility] for food poisioning.  She was told to f/u with PCP today for evaluation of gall Bladder and abd pain.  She has diarrhea, nausea and pain.  Rates pain 10/10. She was given Rx for pain med but it has not been filled. Pt # [id number]  appointment given for today Initial call taken by: Merrie Roof RN,  May 16, 2009 9:08 AM  Follow-up for Phone Call        Noted Follow-up by: Zoila Shutter MD,  May 16, 2009 9:16 AM

## 2010-02-16 NOTE — Progress Notes (Signed)
Summary: Refill/gh  Phone Note Refill Request Message from:  Fax from Pharmacy on May 26, 2009 10:51 AM  Refills Requested: Medication #1:  CIPRO 250 MG TABS Take 1/2 tab by  mouth within an hour after sexual relations..   Dosage confirmed as above?Dosage Confirmed   Last Refilled: 03/29/2009  Method Requested: Electronic Initial call taken by: Angelina Ok RN,  May 26, 2009 10:51 AM    Prescriptions: CIPRO 250 MG TABS (CIPROFLOXACIN HCL) Take 1/2 tab by  mouth within an hour after sexual relations.  #10 x 6   Entered and Authorized by:   Jackson Latino MD   Signed by:   Jackson Latino MD on 05/26/2009   Method used:   Electronically to        [facility] (retail)       [address]       [place], [place]  [address]       Ph: [phone]       Fax: [phone]   RxID:   [id number]

## 2010-02-16 NOTE — Progress Notes (Signed)
Summary: phone/gg  Phone Note From Pharmacy   Caller: [facility] Summary of Call: Pharmacy called and wants to change med from macrobid 100 mg take 1/2 tab ( only comes in capsules ) to macrodantin 50 mg tab. to use after sexual intercourse.   Pharmacy # [id number]  Initial call taken by: Merrie Roof RN,  December 23, 2009 2:49 PM  Follow-up for Phone Call        electronically sent. Follow-up by: Deatra Robinson MD,  December 23, 2009 3:43 PM  Additional Follow-up for Phone Call Additional follow up Details #1::        Phone call completed Additional Follow-up by: Merrie Roof RN,  December 27, 2009 4:31 PM    New/Updated Medications: MACRODANTIN 50 MG CAPS (NITROFURANTOIN MACROCRYSTAL) Take one tablet by mouth daily or as instructed after sexual activity Prescriptions: MACRODANTIN 50 MG CAPS (NITROFURANTOIN MACROCRYSTAL) Take one tablet by mouth daily or as instructed after sexual activity  #30 x 11   Entered and Authorized by:   Deatra Robinson MD   Signed by:   Deatra Robinson MD on 12/28/2009   Method used:   Electronically to        [facility] (retail)       [address]       [place], [place]  [address]       Ph: [phone]       Fax: [phone]   RxID:   [id number]

## 2010-02-16 NOTE — Assessment & Plan Note (Signed)
Summary: VAGINAL ORDOR/SB.   Vital Signs:  Patient profile:   45 year old female Height:      66.0 inches (167.64 cm) Weight:      191.0 pounds (86.82 kg) BMI:     30.94 Temp:     97.5 degrees F (36.39 degrees C) oral Pulse rate:   73 / minute BP sitting:   118 / 74  (right arm)  Vitals Entered By: Stanton Kidney Ditzler RN (October 06, 2009 11:46 AM) Is Patient Diabetic? No Pain Assessment Patient in pain? no      Nutritional Status BMI of > 30 = obese Nutritional Status Detail appetite good  Have you ever been in a relationship where you felt threatened, hurt or afraid?denies   Does patient need assistance? Functional Status Self care Ambulation Normal Comments Past week - urine has odor.   Primary Care Provider:  Jackson Latino MD   History of Present Illness: Pt is a 45 y/o F with PMH outined in the EMR presenting today for acute c/o urinary odor for the past 7 days.  See first noticed this after cessation of her period.  She reports a hx of recurrent BV and UTIs.  Denies fevers ,chills, flank pain..  She reports some dysuria and vaginal itching -  she feels her symptoms are more c/w UTI, but her BV has presented this way in the past.  She takes post-coital Ciprp prescribed by her gynecologist to prevent UTIs.  denies any abnormal vaginal discharge,abnormal vaginal bleeding, abdominal/pelvic pain or abnormal genital lesions.  She has a hx of genital HSV and take daily suppressicve acyclovir; she has not had an outbreak in years.  She has no other concerns or complaints.  States she was tested for HIV at the health dept a few months ago and reports the test was negative.    She has no other concerns/complaints today.  Depression History:      The patient denies a depressed mood most of the day and a diminished interest in her usual daily activities.         Preventive Screening-Counseling & Management  Alcohol-Tobacco     Alcohol drinks/day: 0     Smoking Status: quit > 6  months     Year Quit: 1999  Caffeine-Diet-Exercise     Caffeine use/day: 0     Does Patient Exercise: yes     Type of exercise: walking     Exercise (avg: min/session): <30     Times/week: 3  Current Medications (verified): 1)  Albuterol 90 Mcg/act Aers (Albuterol) .... Inhale 2 Puffs Every 4 - 6 Hours As Needed Shortness of Breatlh 2)  Furosemide 20 Mg Tabs (Furosemide) .... Take 1 Tablet By Mouth Once A Day 3)  Klor-Con 20 Meq Pack (Potassium Chloride) .... Take 1 Tablet By Mouth Once A Day 4)  Acyclovir 200 Mg Caps (Acyclovir) .... Take 1 Tablet By Mouth Two Times A Day 5)  Fluconazole 150 Mg Tabs (Fluconazole) .... Take 1 Tablet By Mouth Once A Day 6)  Cipro 250 Mg Tabs (Ciprofloxacin Hcl) .... Take 1/2 Tab By  Mouth Within An Hour After Sexual Relations. 7)  Metronidazole 500 Mg Tabs (Metronidazole) .... Take 1 Tablet By Mouth Two Times A Day  Allergies (verified): 1)  ! Pcn 2)  ! Epinephrine 3)  ! * Doxycline 4)  ! * Shellfish  Past History:  Past medical, surgical, family and social histories (including risk factors) reviewed for relevance to current acute and chronic  problems.  Past Medical History: Reviewed history from 11/06/2007 and no changes required. Recurrent/Chronic Cystitis ---->Primary pathogen E. Coli with changing sensitivities- last Cx was 8/09=Ecol:sensitive to Nitrofurantoin, Cefazolin, Ceftriaxone, Gentamycin,Tobramycin only ------> CT Abd/pelvis(7/07):neg -------> Hx of W/U @[facility]  center,[place]:Dr. Reuel Boom Allergic rhinitis Hyperlipidemia High Blood pressure:occasionally                                  Not on any meds Genital herpes:Restarted Valtrex 10/09 Acne H/o Bilateral carpal tunnel syndrome 1/06 Hx of tobacco abuse:Quit 1999 H/o Cocaine abuse: Quit 1992  Family History: Reviewed history from 08/18/2009 and no changes required. mother: lung Ca (smoker) and DM  Social History: Reviewed history from 08/18/2009 and no changes  required. 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  Physical Exam  General:  alert, oriented, , no distress. well-nourished and well-hydrated.   Head:  normocephalic and atraumatic.   Eyes:  anicteric vision grossly intact.  PERRLA.EOMI. Mouth:  MMM, appropriate dentition.  Abdomen:  Bowel sounds positive,abdomen soft and non-tender without masses, organomegaly or hernias noted.  No CVA tenderness. Extremities:  no peripheral edema.  Neurologic:  alert & oriented X3, no focal deficit. Skin:  turgor normal, color normal, and no rashes.   Psych:  Cognition and judgment appear intact. Alert and cooperative with normal attention span and concentration. No apparent delusions, illusions, hallucinations   Impression & Recommendations:  Problem # 1:  DYSURIA (ICD-788.1) Will check U/A and cx to eval for UTI.  WIll also check urine for GC/Chlamydia.  Will defer abx for empiric tx of UTI until results are back.  SHe is without hx,symptoms,or exam findings to suggest pyelonephritis or systemic infection.   Her updated medication list for this problem includes:    Cipro 250 Mg Tabs (Ciprofloxacin hcl) .Marland Kitchen... Take 1/2 tab by  mouth within an hour after sexual relations.    Metronidazole 500 Mg Tabs (Metronidazole) .Marland Kitchen... Take 1 tablet by mouth two times a day  Orders: T-Urinalysis ([id number]) T-Culture, Urine ([id number]) T-Chlamydia & GC Probe, Urine ([id number])  Problem # 2:  VAGINITIS, BACTERIAL, RECURRENT (ICD-616.10) Pt symptoms are c/w her typical presentation of BV.  WIll refill her rx for Flagyl.  Pt is due for annual pelvic exam and pap smear; will have her schedule this in the next few weeks.   Her updated medication list for this problem  includes:    Cipro 250 Mg Tabs (Ciprofloxacin hcl) .Marland Kitchen... Take 1/2 tab by  mouth within an hour after sexual relations.    Metronidazole 500 Mg Tabs (Metronidazole) .Marland Kitchen... Take 1 tablet by mouth two times a day  Problem # 3:  HERPES, GENITAL NOS (ICD-054.10) Pt not currently experiencing HSV outbreak.  WIll refill her rx for suppressive acyclovir.   Complete Medication List: 1)  Albuterol 90 Mcg/act Aers (Albuterol) .... Inhale 2 puffs every 4 - 6 hours as needed shortness of breatlh 2)  Furosemide 20 Mg Tabs (Furosemide) .... Take 1 tablet by mouth once a day 3)  Klor-con 20 Meq Pack (Potassium chloride) .... Take 1 tablet by mouth once a day 4)  Acyclovir 200 Mg Caps (Acyclovir) .... Take 1 tablet by mouth two times a day 5)  Fluconazole 150 Mg Tabs (Fluconazole) .... Take 1 tablet by  mouth once a day 6)  Cipro 250 Mg Tabs (Ciprofloxacin hcl) .... Take 1/2 tab by  mouth within an hour after sexual relations. 7)  Metronidazole 500 Mg Tabs (Metronidazole) .... Take 1 tablet by mouth two times a day  Patient Instructions: 1)  Please schedule a follow-up appointment on Tues.  Oct 18 at 4:00pm with Dr Denton Meek for your annual pelvic exam and pap smear. 2)  Try taking a probiotic supplement available at most pharmacies,or eating yogurt every day to help prevent yeast infections. 3)  If you develop fever, chills, abdominal pain, abnormal vaginal discharge, or abnormal vaginal lesions, call the clinic to schedule and appt or go to the emergency room. Prescriptions: ACYCLOVIR 200 MG CAPS (ACYCLOVIR) Take 1 tablet by mouth two times a day  #30 x 11   Entered and Authorized by:   Nelda Bucks DO   Signed by:   Nelda Bucks DO on 10/06/2009   Method used:   Print then Give to Patient   RxID:   [id number] FLUCONAZOLE 150 MG TABS (FLUCONAZOLE) Take 1 tablet by mouth once a day  #2 Tablet x 11   Entered and Authorized by:   Nelda Bucks DO   Signed by:   Nelda Bucks DO on 10/06/2009   Method  used:   Print then Give to Patient   RxID:   [id number] METRONIDAZOLE 500 MG TABS (METRONIDAZOLE) Take 1 tablet by mouth two times a day  #7 x 6   Entered and Authorized by:   Nelda Bucks DO   Signed by:   Nelda Bucks DO on 10/06/2009   Method used:   Print then Give to Patient   RxID:   [id number]  Process Orders Check Orders Results:     [facility]: ABN not required for this insurance Tests Sent for requisitioning (October 06, 2009 12:12 PM):     10/06/2009: [facility] -- T-Urinalysis [[id number]] (signed)     10/06/2009: [facility] -- T-Culture, Urine [[id number]] (signed)     10/06/2009: [facility] -- T-Chlamydia & GC Probe, Urine [[id number]] (signed)     Prevention & Chronic Care Immunizations   Influenza vaccine: Pt. refused  (10/20/2008)   Influenza vaccine deferral: Refused  (10/06/2009)   Influenza vaccine due: 09/15/2009    Tetanus booster: 08/18/2009: 08/2003   Td booster deferral: Deferred  (07/20/2008)    Pneumococcal vaccine: Not documented   Pneumococcal vaccine deferral: Deferred  (07/20/2008)  Other Screening   Pap smear: NEGATIVE FOR INTRAEPITHELIAL LESIONS OR MALIGNANCY.  (10/20/2008)   Pap smear action/deferral: Ordered  (10/20/2008)   Pap smear due: 08/19/2010    Mammogram: ASSESSMENT: Negative - BI-RADS 1^MS DIGITAL SCREENING  (02/11/2009)   Mammogram action/deferral: Deferred  (08/18/2009)   Mammogram due: 08/11/2009   Smoking status: quit > 6 months  (10/06/2009)  Lipids   Total Cholesterol: 230  (12/09/2007)   Lipid panel action/deferral: Lipid Panel ordered   LDL: 142  (12/09/2007)   LDL Direct: Not documented   HDL: 60  (12/09/2007)   Triglycerides: 139  (12/09/2007)   Lipid panel due: 01/08/2008  Hypertension   Last Blood Pressure: 118 / 74  (10/06/2009)   Serum creatinine: 0.73  (09/16/2009)   BMP action: Ordered   Serum potassium 3.5   (09/16/2009)   Basic metabolic panel due: 08/19/2010  Self-Management Support :   Personal Goals (by the next clinic visit) :      Personal blood pressure goal: 140/90  (02/22/2009)  Patient will work on the following items until the next clinic visit to reach self-care goals:     Medications and monitoring: take my medicines every day, bring all of my medications to every visit  (10/06/2009)     Eating: drink diet soda or water instead of juice or soda, eat more vegetables, use fresh or frozen vegetables, eat fruit for snacks and desserts, limit or avoid alcohol  (10/06/2009)     Activity: take a 30 minute walk every day, take the stairs instead of the elevator, park at the far end of the parking lot  (10/06/2009)    Hypertension self-management support: Written self-care plan, Education handout, Resources for patients handout  (10/06/2009)   Hypertension self-care plan printed.   Hypertension education handout printed      Resource handout printed.  Laboratory Results   Urine Tests  Date/Time Received: 10/06/09  10:52AM Date/Time Reported: same  Routine Urinalysis   Color: lt. yellow Appearance: Clear Glucose: negative   (Normal Range: Negative) Bilirubin: negative   (Normal Range: Negative) Ketone: negative   (Normal Range: Negative) Spec. Gravity: 1.015   (Normal Range: 1.003-1.035) Blood: negative   (Normal Range: Negative) pH: 7.0   (Normal Range: 5.0-8.0) Protein: negative   (Normal Range: Negative) Urobilinogen: 0.2   (Normal Range: 0-1) Nitrite: negative   (Normal Range: Negative) Leukocyte Esterace: negative   (Normal Range: Negative)

## 2010-02-16 NOTE — Progress Notes (Signed)
Summary: refill/gg  Phone Note Refill Request  on September 16, 2009 10:08 AM  Refills Requested: Medication #1:  KLOR-CON 20 MEQ PACK Take 1 tablet by mouth once a day Pt uses tablets not pack   Method Requested: Electronic Initial call taken by: Merrie Roof RN,  September 16, 2009 10:08 AM  Follow-up for Phone Call       Follow-up by: Blanch Media MD,  September 16, 2009 11:05 AM    Prescriptions: KLOR-CON 20 MEQ PACK (POTASSIUM CHLORIDE) Take 1 tablet by mouth once a day  #31 x 4   Entered and Authorized by:   Blanch Media MD   Signed by:   Blanch Media MD on 09/16/2009   Method used:   Electronically to        [facility] (retail)       [address]       [place], [place]  [address]       Ph: [phone]       Fax: [phone]   RxID:   [id number]

## 2010-02-16 NOTE — Miscellaneous (Signed)
Summary: Medical Surgical Procedures  Medical Surgical Procedures   Imported By: Florinda Marker 04/06/2009 15:33:12  _____________________________________________________________________  External Attachment:    Type:   Image     Comment:   External Document

## 2010-02-16 NOTE — Assessment & Plan Note (Signed)
Summary: acute-f/u with urine odor/cfb   Vital Signs:  Patient profile:   45 year old female Height:      66.0 inches (167.64 cm) Weight:      186.6 pounds (84.82 kg) BMI:     30.23 Temp:     98.3 degrees F (36.83 degrees C) oral Pulse rate:   77 / minute BP sitting:   121 / 82  (left arm) Cuff size:   large  Vitals Entered By: Cynda Familia Duncan Dull) (August 18, 2009 10:26 AM) CC: vaginal itching Is Patient Diabetic? No Pain Assessment Patient in pain? no      Nutritional Status BMI of 25 - 29 = overweight  Have you ever been in a relationship where you felt threatened, hurt or afraid?No   Does patient need assistance? Functional Status Self care Ambulation Normal   Primary Care Provider:  Jackson Latino MD  CC:  vaginal itching.  History of Present Illness: 1. c/o vaginal itching for 4 days; no discharge; dysuria, no hematuria, no fever, chills, or LBP. 2. Patient has a hx of chronic UTI (postcoital).  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  (ICD-692.4) 8)  Constipation, Chronic  (ICD-564.09) 9)  Hypertension  (ICD-401.9) 10)  High-risk Sexual Behavior  (ICD-V69.2) 11)  Uti's, Recurrent  (ICD-599.0) 12)  Allergic Rhinitis  (ICD-477.9) 13)  Herpes, Genital Nos  (ICD-054.10)  Current Problems (verified): 1)  Gastroenteritis Without Dehydration   (ICD-558.9) 2)  Boils, Recurrent  (ICD-680.9) 3)  Screening For Thyroid Disorder  (ICD-V77.0) 4)  Other Alopecia  (ICD-704.09) 5)  Hemorrhoids, External, With Complication  (ICD-455.5) 6)  Vaginitis, Bacterial, Recurrent  (ICD-616.10) 7)  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 Inhale 2 Puffs Every 4 - 6 Hours As Needed Shortness of Breatlh 2)  Furosemide 20 Mg Tabs (Furosemide) .... Take 1 Tablet By Mouth Once A Day 3)  Klor-Con 20 Meq Pack (Potassium Chloride) .... Take 1 Tablet By Mouth Once A Day 4)  Acyclovir 200 Mg Caps (Acyclovir) .... Take 1 Tablet By Mouth Two Times A Day 5)  Fluconazole 150 Mg Tabs (Fluconazole) .... Take 1 Tablet By Mouth Once A Day 6)  Cipro 250 Mg Tabs (Ciprofloxacin Hcl) .... Take 1/2 Tab By  Mouth Within An Hour After Sexual Relations.  Current Medications (verified): 1)  Albuterol 90 Mcg/act Aers (Albuterol) .... Inhale 2 Puffs Every 4 - 6 Hours As Needed Shortness of Breatlh 2)  Furosemide 20 Mg Tabs (Furosemide) .... Take 1 Tablet By Mouth Once A Day 3)  Klor-Con  20 Meq Pack (Potassium Chloride) .... Take 1 Tablet By Mouth Once A Day 4)  Acyclovir 200 Mg Caps (Acyclovir) .... Take 1 Tablet By Mouth Two Times A Day 5)  Fluconazole 150 Mg Tabs (Fluconazole) .... Take 1 Tablet By Mouth Once A Day 6)  Cipro 250 Mg Tabs (Ciprofloxacin Hcl) .... Take 1/2 Tab By  Mouth Within An Hour After Sexual Relations.  Allergies: 1)  ! Pcn 2)  ! Epinephrine 3)  ! * Doxycline 4)  ! * Shellfish  Directives (verified): 1)  Full Code   Past History:  Past Medical History: Last updated: 11/06/2007 Recurrent/Chronic Cystitis ---->Primary  pathogen E. Coli with changing sensitivities- last Cx was 8/09=[place]:sensitive to Nitrofurantoin, Cefazolin, Ceftriaxone, Gentamycin,Tobramycin only ------> CT Abd/pelvis(7/07):neg -------> Hx of W/U @[facility],[place]:Dr. Reuel Boom Allergic rhinitis Hyperlipidemia High Blood pressure:occasionally                                  Not on any meds Genital herpes:Restarted Valtrex 10/09 Acne H/o Bilateral carpal tunnel syndrome 1/06 Hx of tobacco abuse:Quit 1999 H/o Cocaine abuse: Quit 1992  Family History: Last updated: 08/18/2009 mother: lung Ca (smoker) and DM  Social History: Last updated: 08/18/2009 Occupation: has daycare center completed high school Single Former Smoker Alcohol use-no Drug use-no  Risk Factors: Alcohol Use: 0 (08/18/2009) Caffeine Use: 0 (08/18/2009) Exercise: yes (08/18/2009)  Family History: mother: lung Ca (smoker) and DM  Social History: Reviewed history from 12/03/2006 and no changes required. 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  Head:  normocephalic and atraumatic.   Eyes:  anicteric Ears:  pierced. Nose:  no external deformity.   Mouth:  MMM, appropriate dentition.  Neck:  No deformities, masses, or tenderness noted. Chest Wall:  No deformities, masses, or tenderness noted. Lungs:  Normal respiratory effort, chest expands symmetrically. Lungs are clear to auscultation, no crackles or wheezes. Heart:  Normal rate and regular rhythm. S1 and S2 normal without gallop, murmur, click, rub or other extra sounds. Abdomen:  Bowel sounds positive,abdomen soft and non-tender without masses, organomegaly or hernias noted. Genitalia:  no CMT or adnexal TTP. Whiff test positive. Msk:  very mild tenderness on  left flank below the CVA area that radiates toward the left hip Pulses:  R radial normal and L radial normal.   Extremities:  no peripheral edema.  Neurologic:  alert & oriented X3, cranial nerves II-XII intact, strength normal in all extremities, and sensation intact to light touch.   Skin:  Intact without suspicious lesions or rashes Cervical Nodes:  No lymphadenopathy noted Axillary Nodes:  No palpable lymphadenopathy Inguinal Nodes:  No significant adenopathy Psych:  Cognition and judgment appear intact. Alert and cooperative with normal attention span and concentration. No apparent delusions, illusions, hallucinations   Impression & Recommendations:  Problem # 1:  HYPERTENSION (ICD-401.9)  Her updated medication list for this problem includes:    Furosemide 20 Mg Tabs (Furosemide) .Marland Kitchen... Take 1 tablet by mouth once a day  Orders: T-Basic Metabolic Panel ([id number] Orders: T-Urine Microalbumin w/creat. ratio [id number]) ... 08/19/2009  BP today: 121/82 Prior BP: 116/78 (07/29/2009)  Labs Reviewed: K+: 4.4 (07/20/2008) Creat: : 0.78 (07/20/2008)   Chol: 230 (  12/09/2007)   HDL: 60 (12/09/2007)   LDL: 142 (12/09/2007)   TG: 139 (12/09/2007)  Problem # 2:  VAGINITIS, BACTERIAL, RECURRENT (ICD-616.10)  Her updated medication list for this problem includes:    Cipro 250 Mg Tabs (Ciprofloxacin hcl) .Marland Kitchen... Take 1/2 tab by  mouth within an hour after sexual relations.    Metronidazole 500 Mg Tabs (Metronidazole) .Marland Kitchen... Take 1 tablet by mouth two times a day  Discussed symptomatic relief and treatment options.   Problem # 3:  UTI'S, RECURRENT (ICD-599.0)  Her updated medication list for this problem includes:    Cipro 250 Mg Tabs (Ciprofloxacin hcl) .Marland Kitchen... Take 1/2 tab by  mouth within an hour after sexual relations.    Metronidazole 500 Mg Tabs (Metronidazole) .Marland Kitchen... Take 1 tablet by mouth two times a day  Encouraged to push clear liquids, get enough rest, and take  acetaminophen as needed. To be seen in 10 days if no improvement, sooner if worse.  Complete Medication List: 1)  Albuterol 90 Mcg/act Aers (Albuterol) .... Inhale 2 puffs every 4 - 6 hours as needed shortness of breatlh 2)  Furosemide 20 Mg Tabs (Furosemide) .... Take 1 tablet by mouth once a day 3)  Klor-con 20 Meq Pack (Potassium chloride) .... Take 1 tablet by mouth once a day 4)  Acyclovir 200 Mg Caps (Acyclovir) .... Take 1 tablet by mouth two times a day 5)  Fluconazole 150 Mg Tabs (Fluconazole) .... Take 1 tablet by mouth once a day 6)  Cipro 250 Mg Tabs (Ciprofloxacin hcl) .... Take 1/2 tab by  mouth within an hour after sexual relations. 7)  Metronidazole 500 Mg Tabs (Metronidazole) .... Take 1 tablet by mouth two times a day  Other Orders: T-Lipid Profile ([id number])  Patient Instructions: 1)  Please, return to clinic on an empty stomach for a blood work. 2)  Call with any questions. 3)  Follow up 6 months Prescriptions: METRONIDAZOLE 500 MG TABS (METRONIDAZOLE) Take 1 tablet by mouth two times a day  #7 x 6   Entered and Authorized by:   Deatra Robinson MD   Signed by:   Deatra Robinson MD on 08/18/2009   Method used:   Electronically to        [facility] (retail)       [address]       [place], [place]  [address]       Ph: [phone]       Fax: [phone]   RxID:   [id number] ALBUTEROL 90 MCG/ACT AERS (ALBUTEROL) Inhale 2 puffs every 4 - 6 hours as needed shortness of breatlh  #1 x 11   Entered and Authorized by:   Deatra Robinson MD   Signed by:   Deatra Robinson MD on 08/18/2009   Method used:   Electronically to        [facility] (retail)       [address]       [place], [place]  [address]       Ph: [phone]       Fax: [phone]   RxID:   [id number]  Process Orders Check Orders Results:     [facility]: ABN not required for this insurance Tests Sent for  requisitioning (August 18, 2009 11:36 AM):     08/18/2009: [facility] -- T-Lipid Profile [phone] (signed)  08/18/2009: [facility] -- T-Basic Metabolic Panel [id number] (signed)     08/19/2009: [facility] -- T-Urine Microalbumin w/creat. ratio [[id number]] (signed)     Prevention & Chronic Care Immunizations   Influenza vaccine: Pt. refused  (10/20/2008)   Influenza vaccine deferral: Deferred  (07/20/2008)   Influenza vaccine due: 09/15/2009    Tetanus booster: 08/18/2009: 08/2003   Td booster deferral: Deferred  (07/20/2008)    Pneumococcal vaccine: Not documented   Pneumococcal vaccine deferral: Deferred  (07/20/2008)  Other Screening   Pap smear: NEGATIVE FOR INTRAEPITHELIAL LESIONS OR MALIGNANCY.  (10/20/2008)   Pap smear action/deferral: Ordered  (10/20/2008)   Pap smear due: 08/19/2010    Mammogram: ASSESSMENT: Negative - BI-RADS 1^MS DIGITAL SCREENING  (02/11/2009)   Mammogram action/deferral: Deferred  (08/18/2009)   Mammogram due: 08/11/2009   Smoking status: quit > 6 months  (08/18/2009)  Lipids   Total Cholesterol: 230  (12/09/2007)   Lipid panel action/deferral: Lipid Panel ordered   LDL: 142  (12/09/2007)   LDL Direct: Not documented   HDL: 60  (12/09/2007)   Triglycerides: 139  (12/09/2007)   Lipid panel due: 01/08/2008  Hypertension   Last Blood Pressure: 121 / 82  (08/18/2009)   Serum creatinine: 0.78  (07/20/2008)   BMP action: Ordered   Serum potassium 4.4  (07/20/2008)   Basic metabolic panel due: 08/19/2010    Hypertension flowsheet reviewed?: Yes   Progress toward BP goal: At goal  Self-Management Support :   Personal Goals (by the next clinic visit) :      Personal blood pressure goal: 140/90  (02/22/2009)   Patient will work on the following items until the next clinic visit to reach self-care goals:     Medications and monitoring: take my medicines every day   (08/18/2009)     Eating: drink diet soda or water instead of juice or soda, eat more vegetables, use fresh or frozen vegetables, eat foods that are low in salt, eat baked foods instead of fried foods  (08/18/2009)     Activity: take a 30 minute walk every day  (08/18/2009)    Hypertension self-management support: Pre-printed educational material, Written self-care plan, Resources for patients handout  (08/18/2009)   Hypertension self-care plan printed.      Resource handout printed.    Immunization History:  Tetanus/Td Immunization History:    Tetanus/Td:  08/2003 (08/18/2009)  Influenza Immunization History:    Influenza:  Pt. refused (10/20/2008)  Appended Document: acute-f/u with urine odor/cfb correction on directions of Metronidazole 500 mg 1 tab two times a day for seven days give # 14 with 6 refills.  per Dr Denton Meek

## 2010-02-16 NOTE — Progress Notes (Signed)
Summary: refill/gg  Phone Note Refill Request  on September 20, 2009 11:03 AM  Refills Requested: Medication #1:  ACYCLOVIR 200 MG CAPS Take 1 tablet by mouth two times a day   Dosage confirmed as above?Dosage Confirmed  Follow-up for Phone Call        Rx completed in Dr. Tiajuana Amass Follow-up by: Jackson Latino MD,  September 20, 2009 12:02 PM    Prescriptions: ACYCLOVIR 200 MG CAPS (ACYCLOVIR) Take 1 tablet by mouth two times a day  #30 x 3   Entered and Authorized by:   Jackson Latino MD   Signed by:   Jackson Latino MD on 09/20/2009   Method used:   Electronically to        [facility] (retail)       [address]       [place], [place]  [address]       Ph: [phone]       Fax: [phone]   RxID:   [id number]

## 2010-02-16 NOTE — Assessment & Plan Note (Signed)
Summary: ACUTE-RECHECK - (YANG)/NO BETTER/CFB   Vital Signs:  Patient profile:   45 year old female Height:      66.0 inches Weight:      189.0 pounds BMI:     30.62 Temp:     98.6 degrees F oral Pulse rate:   90 / minute BP sitting:   119 / 78  (right arm)  Vitals Entered By: Filomena Jungling NT II (February 22, 2009 9:12 AM) CC:  DICHARGE AND ITCHING AND SOME BURNING UPON URINATION Is Patient Diabetic? No Pain Assessment Patient in pain? no       Does patient need assistance? Functional Status Self care Ambulation Normal   Primary Care Provider:  Jackson Latino MD  CC:   DICHARGE AND ITCHING AND SOME BURNING UPON URINATION.  History of Present Illness: This is a  45 year old woman with PMH significant for chronic UTIs, HLD, and Herpes who presents for   recurrent bacterial vaginosis urinary complaints.  Pt most recently seen 02-10-2009 by Dr. Otilio Carpen for vaginal fishy odor and discharge. Was treated with oral flagyl for 7 days and also given flagyl gel to use before intercourse each time, however patient cannot afford gel.  GC/Chlamydia/Candida/Trichomonas negative, positive for Gardnerella.Her partner is using a cleaning solution for his foreskin.Has not been using latex condoms due to allergy.   Today, pt is having fishy odor, itching and slight discharge.Also having back pain in her left flank X3 days. No fevers, shaking chills. No nausea, vomiting, diarrhea. Also having urinary complaints X3 days. She says that her urine stinks like ammonia. Dysuria, increased frequency.       Preventive Screening-Counseling & Management  Alcohol-Tobacco     Alcohol drinks/day: 0     Smoking Status: quit     Year Quit: 1999  Caffeine-Diet-Exercise     Does Patient Exercise: no  Current Medications (verified): 1)  Albuterol 90 Mcg/act Aers (Albuterol) .... Inhale 2 Puffs Every 4 - 6 Hours As Needed Shortness of Breatlh 2)  Flexeril 5 Mg Tabs (Cyclobenzaprine Hcl) .... Take One  Tablet By Mouth Every 12 Hours As Needed For Pain and Muscle Spasm 3)  Furosemide 20 Mg Tabs (Furosemide) .... Take 1 Tablet By Mouth Once A Day 4)  Klor-Con 20 Meq Pack (Potassium Chloride) .... Take 1 Tablet By Mouth Once A Day 5)  Acyclovir 200 Mg Caps (Acyclovir) .... Take 1 Tablet By Mouth Two Times A Day 6)  Fluconazole 150 Mg Tabs (Fluconazole) .... Take 1 Tablet By Mouth Once A Day 7)  Metronidazole 0.75 % Gel (Metronidazole) .... Use One Applicator Full (37.5mg ) Twice Weekly. 8)  Metronidazole 500 Mg Tabs (Metronidazole) .... One Tablet Two Times A Day For 7 Days. 9)  Ciprofloxacin Hcl 250 Mg Tabs (Ciprofloxacin Hcl) .... Take 1 Tablet By Mouth Two Times A Day For 7 Days. 10)  Cipro 250 Mg Tabs (Ciprofloxacin Hcl) .... Take 1 Tab After Intercourse.  Allergies (verified): 1)  ! Pcn 2)  ! Epinephrine 3)  ! * Doxycline 4)  ! * Shellfish  Review of Systems General:  Denies chills, loss of appetite, and weakness. GI:  Denies abdominal pain, constipation, diarrhea, nausea, and vomiting. GU:  Complains of discharge, dysuria, hematuria, nocturia, urinary frequency, and urinary hesitancy.  Physical Exam  General:  alert and well-developed.   Head:  normocephalic and atraumatic.   Eyes:  vision grossly intact, pupils equal, pupils round, and pupils reactive to light.   Neck:  supple and full  ROM.   Lungs:  normal respiratory effort and normal breath sounds.   Heart:  normal rate and regular rhythm.   Abdomen:  soft, non-tender, normal bowel sounds, no distention, and no masses.   Genitalia:  normal introitus, no external lesions, mucosa pink and moist, no vaginal or cervical lesions, no vaginal atrophy, no friaility or hemorrhage, and vaginal discharge.     Impression & Recommendations:  Problem # 1:  VAGINITIS, BACTERIAL, RECURRENT (ICD-616.10) Assessment Unchanged It is unclear the exact etiology for patient's recurrent BV. Pt is not diabetic and is not immunocompromised. There  is a correlation however between Bactrim therapy and onset of BV. Bactrim was recently stopped in setting of recurrent BV. On examination today, pt does have a slight odor with white creamy discharge. Pt was previously recommended to use metronidazole gel, however patient cannot afford medication just yet. As advised, patient is practicing good sexual hygene.  Plan: -One more course of Metronidazole  -GYN Referral   Her updated medication list for this problem includes:    Metronidazole 500 Mg Tabs (Metronidazole) ..... One tablet two times a day for 7 days.    Ciprofloxacin Hcl 250 Mg Tabs (Ciprofloxacin hcl) .Marland Kitchen... Take 1 tablet by mouth two times a day for 7 days.    Cipro 250 Mg Tabs (Ciprofloxacin hcl) .Marland Kitchen... Take 1 tab after intercourse.  Orders: Gynecologic Referral (Gyn) T-Wet Prep by [facility] [phone])  Problem # 2:  UTI'S, RECURRENT (ICD-599.0) Assessment: Comment Only Pt has had numerous UTIs and had a urology work-up in Jan. 2008 by Dr. Reuel Boom in [place]. The cystoscopy and CT abd/pelvis were unrevealing at the time and patient was instructed to take Ciprofloxacin after intercourse. Pt had been doing so accordingly, until she developed tenodonitis of thumb in 07/2008. 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 She was given anti-inflammatories and tendonitis had resolved. However, given her tendonitis patient's Cipro was d/ced as Cipro does have adverse effects of worsening tendonitis in 07/2008 and was switched to Bactrim and has been continued on Bactrim ever since. According to last urine culture, pt is not resistant to any antibiotic. UA today is positive for blood, leukocytes, and nitrates and patient has urinary symptoms consistent with UTI. Plan: -Cipro for 7 days for UTI  -Restart cipro to be taken after intercourse -Urine culture as last culture 2009 with sensitivities  -Urology f/u  with Dr. Reuel Boom   Her updated medication list for this problem includes:    Metronidazole 500 Mg Tabs (Metronidazole) ..... One tablet two times a day for 7 days.    Ciprofloxacin Hcl 250 Mg Tabs (Ciprofloxacin hcl) .Marland Kitchen... Take 1 tablet by mouth two times a day for 7 days.    Cipro 250 Mg Tabs (Ciprofloxacin hcl) .Marland Kitchen... Take 1 tab after intercourse.  Orders: Gynecologic Referral (Gyn) T-Culture, Urine [id number]) Urology Referral (Urology) T-Urinalysis Dipstick only ([id number])  Complete Medication List: 1)  Albuterol 90 Mcg/act Aers (Albuterol) .... Inhale 2 puffs every 4 - 6 hours as needed shortness of breatlh 2)  Flexeril 5 Mg Tabs (Cyclobenzaprine hcl) .... Take one tablet by mouth every 12 hours as needed for pain and muscle spasm 3)  Furosemide 20 Mg Tabs (Furosemide) .... Take 1 tablet by mouth once a day 4)  Klor-con 20 Meq Pack (Potassium chloride) .... Take 1 tablet by mouth once a day 5)  Acyclovir 200 Mg Caps (Acyclovir) .... Take  1 tablet by mouth two times a day 6)  Fluconazole 150 Mg Tabs (Fluconazole) .... Take 1 tablet by mouth once a day 7)  Metronidazole 0.75 % Gel (Metronidazole) .... Use one applicator full (37.5mg ) twice weekly. 8)  Metronidazole 500 Mg Tabs (Metronidazole) .... One tablet two times a day for 7 days. 9)  Ciprofloxacin Hcl 250 Mg Tabs (Ciprofloxacin hcl) .... Take 1 tablet by mouth two times a day for 7 days. 10)  Cipro 250 Mg Tabs (Ciprofloxacin hcl) .... Take 1 tab after intercourse.  Patient Instructions: 1)  Please take Cipro and Metronidazole as prescribed.  2)  Please follow up with GYN and Urology as scheduled. 3)  Please take Cipro after intercourse as previously instructed. 4)  Please contact clinic if symptoms persist or worsen.  Prescriptions: CIPRO 250 MG TABS (CIPROFLOXACIN HCL) Take 1 tab after intercourse.  #12 x 2   Entered and Authorized by:   Melida Quitter MD   Signed by:   Melida Quitter MD on 02/22/2009   Method used:   Print  then Give to Patient   RxID:   [id number] CIPROFLOXACIN HCL 250 MG TABS (CIPROFLOXACIN HCL) Take 1 tablet by mouth two times a day for 7 days.  #14 x 0   Entered and Authorized by:   Melida Quitter MD   Signed by:   Melida Quitter MD on 02/22/2009   Method used:   Print then Give to Patient   RxID:   [id number] METRONIDAZOLE 500 MG TABS (METRONIDAZOLE) One tablet two times a day for 7 days.  #14 x 0   Entered and Authorized by:   Melida Quitter MD   Signed by:   Melida Quitter MD on 02/22/2009   Method used:   Print then Give to Patient   RxID:   [id number] FUROSEMIDE 20 MG TABS (FUROSEMIDE) Take 1 tablet by mouth once a day  #31 x 6   Entered and Authorized by:   Melida Quitter MD   Signed by:   Melida Quitter MD on 02/22/2009   Method used:   Print then Give to Patient   RxID:   [id number]   Process Orders Check Orders Results:     [facility]: ABN not required for this insurance Tests Sent for requisitioning (February 22, 2009 12:23 PM):     02/22/2009: [facility] -- T-Culture, Urine [[id number]] (signed)     02/22/2009: [facility] -- T-Wet Prep by [facility] [phone] (signed)     New Orders: 1)  Gynecologic Referral (Gyn) 2)  T-Culture, Urine ([id number]) 3)  Urology Referral (Urology) 4)  T-Wet Prep by [facility] [phone]) 5)  T-Urinalysis Dipstick only ([id number]) 6)  Est. Patient Level IV (32202)  Laboratory Results   Urine Tests  Date/Time Received: .Krystal Eaton [facility])  February 22, 2009 9:28 AM  Date/Time Reported: .Krystal Eaton [facility])  February 22, 2009 9:29 AM   Routine Urinalysis   Color: yellow Appearance: Hazy Glucose: negative   (Normal Range: Negative) Bilirubin: negative   (Normal Range: Negative) Ketone: negative   (Normal Range: Negative) Spec. Gravity: 1.020   (Normal Range: 1.003-1.035) Blood: trace-intact   (Normal Range: Negative) pH: 5.0    (Normal Range: 5.0-8.0) Protein: negative   (Normal Range: Negative) Urobilinogen: 0.2   (Normal Range: 0-1) Nitrite: positive   (Normal Range: Negative) Leukocyte Esterace: large   (Normal Range: Negative)        Prevention & Chronic Care Immunizations   Influenza  vaccine: Pt. refused  (10/20/2008)   Influenza vaccine deferral: Deferred  (07/20/2008)   Influenza vaccine due: 09/15/2008    Tetanus booster: Not documented   Td booster deferral: Deferred  (07/20/2008)    Pneumococcal vaccine: Not documented   Pneumococcal vaccine deferral: Deferred  (07/20/2008)  Other Screening   Pap smear: NEGATIVE FOR INTRAEPITHELIAL LESIONS OR MALIGNANCY.  (10/20/2008)   Pap smear action/deferral: Ordered  (10/20/2008)   Pap smear due: 10/20/2009    Mammogram: ASSESSMENT: Negative - BI-RADS 1^MS DIGITAL SCREENING  (02/11/2009)   Mammogram due: 01/12/2009   Smoking status: quit  (02/22/2009)  Lipids   Total Cholesterol: 230  (12/09/2007)   Lipid panel action/deferral: Deferred   LDL: 142  (12/09/2007)   LDL Direct: Not documented   HDL: 60  (12/09/2007)   Triglycerides: 139  (12/09/2007)  Hypertension   Last Blood Pressure: 119 / 78  (02/22/2009)   Serum creatinine: 0.78  (07/20/2008)   BMP action: Ordered   Serum potassium 4.4  (07/20/2008)    Hypertension flowsheet reviewed?: Yes   Progress toward BP goal: At goal  Self-Management Support :   Personal Goals (by the next clinic visit) :      Personal blood pressure goal: 140/90  (02/22/2009)   Patient will work on the following items until the next clinic visit to reach self-care goals:     Medications and monitoring: take my medicines every day  (02/22/2009)     Eating: drink diet soda or water instead of juice or soda, eat more vegetables, use fresh or frozen vegetables, eat foods that are low in salt, eat baked foods instead of fried foods, eat fruit for snacks and desserts, limit or avoid alcohol  (02/22/2009)      Activity: take a 30 minute walk every day  (01/18/2009)    Hypertension self-management support: Written self-care plan  (02/22/2009)   Hypertension self-care plan printed.

## 2010-02-16 NOTE — Assessment & Plan Note (Signed)
Summary: abd pain/gg   Vital Signs:  Patient profile:   45 year old female Height:      66.0 inches (167.64 cm) Weight:      189.7 pounds (86.23 kg) BMI:     30.73 Temp:     96.9 degrees F (36.06 degrees C) oral Pulse rate:   78 / minute BP sitting:   116 / 75  (right arm)  Vitals Entered By: Stanton Kidney Ditzler RN (May 16, 2009 1:45 PM) Is Patient Diabetic? No Pain Assessment Patient in pain? yes     Location: abdomen Intensity: 7 Type: pain Onset of pain  past 3 days Nutritional Status BMI of > 30 = obese Nutritional Status Detail appetite down  Have you ever been in a relationship where you felt threatened, hurt or afraid?denies   Does patient need assistance? Functional Status Self care Ambulation Normal Comments FU [facility] 05/15/09 - food poison and refills Cipro.   Primary Care Provider:  Jackson Latino MD   History of Present Illness: Pt is a 45 yo female w/ past med hx below here for ER f/u of abdominal pain.  She was seen in the ER yesterday and dx'd w/ food poisoning.  Neg workup at that time included lipase, CBC, urine pregnancy test, UA, and CMET. 3 days ago she ate [employer] and had a cheeseburger, fries and slaw.  She denied etoh use.  Within a couple of hours she developed abdominal pain and vomiting, which helped the pain.  She has had multiple episodes of diarrhea with this and it has mucos, no blood.  She is getting better.  No fevers, chills, hematemesis, hematochezia.  She does feel weak but no orthostatic symptoms.  She was given reglan by the ER but hasn't started it yet.   She needs refills on her cipro.    Abscess drained at her last visit has resolved.   Depression History:      The patient denies a depressed mood most of the day and a diminished interest in her usual daily activities.         Preventive Screening-Counseling & Management  Alcohol-Tobacco     Alcohol drinks/day: 0     Smoking Status: quit     Year Quit:  1999  Caffeine-Diet-Exercise     Does Patient Exercise: no  Current Medications (verified): 1)  Albuterol 90 Mcg/act Aers (Albuterol) .... Inhale 2 Puffs Every 4 - 6 Hours As Needed Shortness of Breatlh 2)  Furosemide 20 Mg Tabs (Furosemide) .... Take 1 Tablet By Mouth Once A Day 3)  Klor-Con 20 Meq Pack (Potassium Chloride) .... Take 1 Tablet By Mouth Once A Day 4)  Acyclovir 200 Mg Caps (Acyclovir) .... Take 1 Tablet By Mouth Two Times A Day 5)  Fluconazole 150 Mg Tabs (Fluconazole) .... Take 1 Tablet By Mouth Once A Day 6)  Cipro 250 Mg Tabs (Ciprofloxacin Hcl) .... Take 1/2 Tab By  Mouth Within An Hour After Sexual Relations.  Allergies: 1)  ! Pcn 2)  ! Epinephrine 3)  ! * Doxycline 4)  ! * Shellfish  Past History:  Past Medical History: Last updated: 11/06/2007 Recurrent/Chronic Cystitis ---->Primary pathogen E. Coli with changing sensitivities- last Cx was 8/09=[facility]:sensitive to Nitrofurantoin, Cefazolin, Ceftriaxone, Gentamycin,Tobramycin only ------> CT Abd/pelvis(7/07):neg -------> Hx of W/U @Urology  center,[place]:Dr. Reuel Boom Allergic rhinitis Hyperlipidemia High Blood pressure:occasionally  Not on any meds Genital herpes:Restarted Valtrex 10/09 Acne H/o Bilateral carpal tunnel syndrome 1/06 Hx of tobacco abuse:Quit 1999 H/o Cocaine abuse: Quit 1992  Social History: Last updated: 12/03/2006 Occupation: has daycare center Single Former Smoker Alcohol use-no Drug use-no  Risk Factors: Smoking Status: quit (05/16/2009)  Social History: Reviewed history from 12/03/2006 and no changes required. Occupation: has daycare center Single Former Smoker Alcohol use-no Drug use-no  Review of Systems       as per hpi.  Physical Exam  General:  alert, oriented, appropriately groomed, no distress.  Eyes:  anicteric Ears:  pierced. Mouth:  MMM, appropriate dentition.  Lungs:  Normal respiratory effort, chest expands  symmetrically. Lungs are clear to auscultation, no crackles or wheezes. Heart:  Normal rate and regular rhythm. S1 and S2 normal without gallop, murmur, click, rub or other extra sounds. Abdomen:  +BS's, soft, TTP mildly in the epigastrium without rebound or gaurding, no HSM, neg murphy's.  Extremities:  no peripheral edema.  Skin:  L axillary abscess resolved.  Multiple tatoos.  Axillary Nodes:  no LAD. Psych:  mood euthymic.    Impression & Recommendations:  Problem # 1:  GASTROENTERITIS WITHOUT DEHYDRATION (ICD-558.9) Seen in ER yesterday w/ nl CBC, CMET, lipase, urine preg, and UA.  Clinically resolving.  Advised her to hold lasix for the next few days.  Encouraged clear liquids.  Will f/u if symptoms persist.   Problem # 2:  BOILS, RECURRENT (ICD-680.9) Axillary abscess resolved.  Problem # 3:  HYPERTENSION (ICD-401.9) Will hold lasix while she is having diarrhea and vomiting.  All her recent BP's have been good and she is on a low dose of diuretic.  Her updated medication list for this problem includes:    Furosemide 20 Mg Tabs (Furosemide) .Marland Kitchen... Take 1 tablet by mouth once a day  Problem # 4:  UTI'S, RECURRENT (ICD-599.0) On cipro prophylaxis.  Checked bottle and she had refills.  Her updated medication list for this problem includes:    Cipro 250 Mg Tabs (Ciprofloxacin hcl) .Marland Kitchen... Take 1/2 tab by  mouth within an hour after sexual relations.  Complete Medication List: 1)  Albuterol 90 Mcg/act Aers (Albuterol) .... Inhale 2 puffs every 4 - 6 hours as needed shortness of breatlh 2)  Furosemide 20 Mg Tabs (Furosemide) .... Take 1 tablet by mouth once a day 3)  Klor-con 20 Meq Pack (Potassium chloride) .... Take 1 tablet by mouth once a day 4)  Acyclovir 200 Mg Caps (Acyclovir) .... Take 1 tablet by mouth two times a day 5)  Fluconazole 150 Mg Tabs (Fluconazole) .... Take 1 tablet by mouth once a day 6)  Cipro 250 Mg Tabs (Ciprofloxacin hcl) .... Take 1/2 tab by  mouth within an  hour after sexual relations.  Patient Instructions: 1)  Please make a followup appointment in 6 months. 2)  Call sooner if you need anything. 3)  Hold your lasix for the next 4-5 days. 4)  Make sure to drink plenty of clear liquids.  Try to stay away from dairy.   Prevention & Chronic Care Immunizations   Influenza vaccine: Pt. refused  (10/20/2008)   Influenza vaccine deferral: Deferred  (07/20/2008)   Influenza vaccine due: 09/15/2008    Tetanus booster: Not documented   Td booster deferral: Deferred  (07/20/2008)    Pneumococcal vaccine: Not documented   Pneumococcal vaccine deferral: Deferred  (07/20/2008)  Other Screening   Pap smear: NEGATIVE FOR INTRAEPITHELIAL LESIONS OR MALIGNANCY.  (10/20/2008)  Pap smear action/deferral: Ordered  (10/20/2008)   Pap smear due: 10/20/2009    Mammogram: ASSESSMENT: Negative - BI-RADS 1^MS DIGITAL SCREENING  (02/11/2009)   Mammogram due: 01/12/2009   Smoking status: quit  (05/16/2009)  Lipids   Total Cholesterol: 230  (12/09/2007)   Lipid panel action/deferral: Deferred   LDL: 142  (12/09/2007)   LDL Direct: Not documented   HDL: 60  (12/09/2007)   Triglycerides: 139  (12/09/2007)  Hypertension   Last Blood Pressure: 116 / 75  (05/16/2009)   Serum creatinine: 0.78  (07/20/2008)   BMP action: Ordered   Serum potassium 4.4  (07/20/2008)    Hypertension flowsheet reviewed?: Yes   Progress toward BP goal: At goal  Self-Management Support :   Personal Goals (by the next clinic visit) :      Personal blood pressure goal: 140/90  (02/22/2009)   Patient will work on the following items until the next clinic visit to reach self-care goals:     Medications and monitoring: take my medicines every day, check my blood pressure, bring all of my medications to every visit, weigh myself weekly  (05/16/2009)     Eating: eat more vegetables, use fresh or frozen vegetables, eat fruit for snacks and desserts, limit or avoid alcohol   (05/16/2009)     Activity: take a 30 minute walk every day  (05/16/2009)    Hypertension self-management support: Written self-care plan, Education handout, Resources for patients handout  (05/16/2009)   Hypertension self-care plan printed.   Hypertension education handout printed      Resource handout printed.

## 2010-02-16 NOTE — Assessment & Plan Note (Signed)
Summary: acute-foul smelling urine okay to add per gladys/cfb   Vital Signs:  Patient profile:   45 year old female Height:      66.0 inches (167.64 cm) Weight:      189.6 pounds (86.18 kg) BMI:     30.71 Temp:     97.3 degrees F Pulse rate:   78 / minute BP sitting:   125 / 85  (right arm)  Vitals Entered By: Chinita Pester RN (February 10, 2009 10:00 AM) CC:  Foul smelling urine w/ some left side pain. Is Patient Diabetic? No Pain Assessment Patient in pain? no      Nutritional Status BMI of > 30 = obese  Have you ever been in a relationship where you felt threatened, hurt or afraid?No   Does patient need assistance? Functional Status Self care Ambulation Normal   Primary Care Provider:  Jackson Latino MD  CC:   Foul smelling urine w/ some left side pain.Marland Kitchen  History of Present Illness: This is a  year old woman with past medical history outlined in chart.  She has had recurrent bacterial vaginosis in the past, and was most recently seen 01-18-2009 by Dr. Clent Ridges for vaginal fishy odor and discharge.  At last visit labs showed GC/Chlamydia/Candida/Trichomonas negative. She was positive for Gardnerella.  At that visit it was felt that this was most likely BV again. Was treated with oral flagyl for 10 days and also given flagyl gel to use before intercourse each time.  Also rec that she and her partner pay attention to hygeine before and after sex.  She was told to stop taking bactrim with sex and will use diflucan only when having symptoms of yeast.    Took full 10 day course of flagyl. Has not been using gel because it was too expensive.  Her partner is using a cleaning solution for his foreskin. Has not been using condoms because they make her itch and she thinks she is allergic to latex. She was unaware that there are non-latex condoms.   Having fishy odor and slight discharge, but is not as bad as it was on the 01-18-2009 visit.   Also having back pain in her left flank X3  days. No fevers, shaking chills. No nausea, vomiting, diarrhea.  Also having urinary complaints X3 days. She says that her urine stinks like ammonia. No dysuria. Possible increased frequency.     She said she thinks she had had BV every other month for the past year.   The flagyl helped this time for about a week after she finished the course but now  latex allergy?  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 Pcn 2)  ! Epinephrine 3)  ! * Doxycline 4)  ! * Shellfish  Review of Systems  The patient denies anorexia, fever, weight loss, weight gain, vision loss, decreased hearing, hoarseness, chest pain, syncope, dyspnea on exertion, peripheral edema, prolonged cough, headaches, hemoptysis, abdominal pain, melena, hematochezia, severe indigestion/heartburn, hematuria, incontinence, genital sores, muscle weakness, suspicious skin lesions, transient blindness, difficulty walking, depression, unusual weight change, abnormal bleeding, and enlarged lymph nodes.         see HPI regarding vaginal discharge and fishy odor  Physical Exam  General:  alert,  well-developed, well-nourished, and well-hydrated.   Head:  normocephalic and atraumatic.   Eyes:  vision grossly intact, pupils equal, pupils round, and pupils reactive to light.   Ears:  no external deformities.   Nose:  no external deformity.   Lungs:  normal respiratory effort, no accessory muscle use, normal breath sounds, no crackles, and no wheezes.   Heart:  normal rate, regular rhythm, and no murmur.   Abdomen:  soft, non-tender, and normal bowel sounds.    no suprapubic tenderness Msk:  very mild tenderness on left flank below the CVA area that radiates toward the left hip Neurologic:  alert & oriented X3, cranial nerves II-XII intact, strength normal in all extremities, and sensation intact to light touch.   Psych:  Oriented X3, memory intact for recent and remote, normally interactive, good eye contact, not anxious appearing, and not depressed appearing.     Impression & Recommendations:  Problem # 1:  VAGINITIS, BACTERIAL, RECURRENT (ICD-616.10) This patient has recurrent BV based on history. I have elected to do no labs today to confirm this because the patient is so sure this is what is going on. I discussed this case with Dr. Rogelia Boga, and we reviewed the literature for treatment of recurrent BV. Current recommendations are clinda or flagyl followed by 3-6 month use of metronidazole gel. The patient was treated at last visit with flagyl and given Rx for metronidazole gel everytime she has sex. As per HPI, she did not use the metronidazole gel because of expense, but she tells me that she will do her best to fill it soon. The plan discussed today was to try a course of clindamycin instead of metronidazole followed by metronidazole gel use twice weekly for 3-6 months as well as non-latex condom use given the possible latex allergy in this patient. She tells me that the time she took Clindamycin before she had diarrhea and refuses to take it this time. As such, I will use a 7 day course  of oral metronidazole followed by the gel and non-latex condom use.   Her updated medication list for this problem includes:    Metronidazole 500 Mg Tabs (Metronidazole) ..... One tablet two times a day for 7 days.  Her updated medication list for this problem includes:    Metronidazole 500 Mg Tabs (Metronidazole) ..... One tablet two times a day for 10 days.  Problem # 2:  OTHER NONSPECIFIC FINDING EXAMINATION OF URINE (ICD-791.9) As per HPI she stated her urine has smelled funny for the last 3 days. Given her lack of dysuria or other urinary complaints and a completely normal urine dipstick performed in the office today, I am not concerned for UTI. She did have a few days of left flank discomfort, but on exam it is really lower than where the kidneys sit, and it radiated to the left side more toward the hip. This seems much  more consistent with a MSK complaints that can just be monitored. For now will not pursue any further diagnosis or treatment of her urinary complaint, but this can be followed on her next visit.  Orders: T-Urinalysis Dipstick only (13086VH)  Complete Medication List: 1)  Albuterol 90 Mcg/act Aers (Albuterol) .... Inhale 2 puffs every 4 - 6 hours as needed shortness of breatlh 2)  Flexeril 5 Mg Tabs (Cyclobenzaprine hcl) .... Take one tablet by mouth every 12 hours as needed for pain and muscle spasm 3)  Furosemide 20 Mg Tabs (Furosemide) .... Take 1 tablet by mouth once a day 4)  Klor-con 20 Meq Pack (Potassium chloride) .... Take 1 tablet by mouth once a day 5)  Acyclovir 200 Mg Caps (Acyclovir) .... Take 1 tablet by mouth two times a day 6)  Fluconazole 150 Mg Tabs (Fluconazole) .... Take 1 tablet by mouth once a day 7)  Metronidazole 0.75 % Gel (Metronidazole) .... Use one applicator full (37.5mg ) twice weekly. 8)  Metronidazole 500 Mg Tabs (Metronidazole) .... One tablet two times a day for 7 days.  Patient Instructions: 1)  Please take the 7 day course of  Metronidazole and start using the metronidazole gel twice weekly as we discussed. Also, please use Non-Latex condoms with sex.  Prescriptions: METRONIDAZOLE 500 MG TABS (METRONIDAZOLE) One tablet two times a day for 7 days.  #14 x 0   Entered and Authorized by:   Aris Lot MD   Signed by:   Aris Lot MD on 02/10/2009   Method used:   Print then Give to Patient   RxID:   [id number] METRONIDAZOLE 0.75 % GEL (METRONIDAZOLE) Use one applicator full (37.5mg ) twice weekly.  #45g x 3   Entered and Authorized by:   Aris Lot MD   Signed by:   Aris Lot MD on 02/10/2009   Method used:   Print then Give to Patient   RxID:   [id number] METRONIDAZOLE 500 MG TABS (METRONIDAZOLE) One tablet two times a day for 7 days.  #14 x 0   Entered and Authorized by:   Aris Lot MD   Signed by:   Aris Lot MD on 02/10/2009   Method used:   Print then Give to Patient   RxID:   [id number] METRONIDAZOLE 0.75 % GEL (METRONIDAZOLE) Use one applicator full (37.5mg ) every time you have sex.  #45g x 3   Entered and Authorized by:   Aris Lot MD   Signed by:   Aris Lot MD on 02/10/2009   Method used:   Print then Give to Patient   RxID:   [id number]    Prevention & Chronic Care Immunizations   Influenza vaccine: Pt. refused  (10/20/2008)   Influenza vaccine deferral: Deferred  (07/20/2008)   Influenza vaccine due: 09/15/2008    Tetanus booster: Not documented   Td booster deferral: Deferred  (07/20/2008)    Pneumococcal vaccine: Not documented   Pneumococcal vaccine deferral: Deferred  (07/20/2008)  Other Screening   Pap smear: NEGATIVE FOR INTRAEPITHELIAL LESIONS OR MALIGNANCY.  (10/20/2008)   Pap smear action/deferral: Ordered  (10/20/2008)   Pap smear due: 10/20/2009    Mammogram: ASSESSMENT: Negative - BI-RADS 1^MS DIGITAL SCREENING  (01/13/2008)   Mammogram due: 01/12/2009   Smoking status: quit  (02/10/2009)  Lipids    Total Cholesterol: 230  (12/09/2007)   Lipid panel action/deferral: Deferred   LDL: 142  (12/09/2007)   LDL Direct: Not documented   HDL: 60  (12/09/2007)   Triglycerides: 139  (  12/09/2007)  Hypertension   Last Blood Pressure: 125 / 85  (02/10/2009)   Serum creatinine: 0.78  (07/20/2008)   BMP action: Ordered   Serum potassium 4.4  (07/20/2008)  Self-Management Support :   Personal Goals (by the next clinic visit) :      Personal blood pressure goal: 140/90  (10/20/2008)   Hypertension self-management support: Written self-care plan  (10/20/2008)   Laboratory Results   Urine Tests  Date/Time Recieved: 02/10/09  1015am Date/Time Reported: 02/10/09  1015am  Routine Urinalysis   Color: yellow Glucose: negative   (Normal Range: Negative) Bilirubin: negative   (Normal Range: Negative) Ketone: negative   (Normal Range: Negative) Spec. Gravity: 1.015   (Normal Range: 1.003-1.035) Blood: negative   (Normal Range: Negative) pH: 6.0   (Normal Range: 5.0-8.0) Protein: negative   (Normal Range: Negative) Urobilinogen: 0.2   (Normal Range: 0-1) Nitrite: negative   (Normal Range: Negative) Leukocyte Esterace: negative   (Normal Range: Negative)

## 2010-02-16 NOTE — Progress Notes (Signed)
Summary: refill/ hla  Phone Note Refill Request Message from:  Fax from Pharmacy on March 29, 2009 2:29 PM  Refills Requested: Medication #1:  KLOR-CON 20 MEQ PACK Take 1 tablet by mouth once a day   Dosage confirmed as above?Dosage Confirmed   Last Refilled: 2/14 last visit 2/11, last cmet 07/2008  Initial call taken by: Marin Roberts RN,  March 29, 2009 2:29 PM    Prescriptions: KLOR-CON 20 MEQ PACK (POTASSIUM CHLORIDE) Take 1 tablet by mouth once a day  #31 x 4   Entered and Authorized by:   Jackson Latino MD   Signed by:   Jackson Latino MD on 03/29/2009   Method used:   Electronically to        [facility] (retail)       [address]       [place], [place]  [address]       Ph: [phone]       Fax: [phone]   RxID:   [id number]

## 2010-02-16 NOTE — Assessment & Plan Note (Signed)
Summary: ACUTE-FOUL SMELLING URINE/MILLS/CFB   Vital Signs:  Patient profile:   45 year old female Height:      66.0 inches (167.64 cm) Weight:      192.0 pounds (86.82 kg) BMI:     30.94 Temp:     96.8 degrees F (36 degrees C) oral Pulse rate:   71 / minute BP sitting:   138 / 90  (right arm) Cuff size:   large  Vitals Entered By: Theotis Barrio NT II (December 23, 2009 10:47 AM) CC: FOUL SMELLING URINE  - ON GOING FOR ABOUT 2 WEEKS /  MEDICATION REFILL /    Is Patient Diabetic? No Pain Assessment Patient in pain? no      Nutritional Status BMI of > 30 = obese  Have you ever been in a relationship where you felt threatened, hurt or afraid?No   Does patient need assistance? Functional Status Self care Ambulation Normal   Primary Care Andie Mortimer:  Jackson Latino MD  CC:  FOUL SMELLING URINE  - ON GOING FOR ABOUT 2 WEEKS /  MEDICATION REFILL /   .  History of Present Illness: c/o dysuria and malodorous urine, no dyspareunia, LBP, fever, or chills. Denies any vaginal bleeding. last MP 3 weeks ago. Sexually active with 2 female partners --no contraception. States that "somebody cancelled her PAP," and "that she was not aware of the change." last WWE was done in 2010 and no Hx of abnormal paps. Patient "demands" to have her Pap done today. Overall, patient is hostile, rude to an undersigned phsyician.  Preventive Screening-Counseling & Management  Alcohol-Tobacco     Alcohol drinks/day: 0     Smoking Status: quit > 6 months     Year Quit: 1999  Caffeine-Diet-Exercise     Caffeine use/day: 0     Does Patient Exercise: yes     Type of exercise: walking     Exercise (avg: min/session): <30     Times/week: 3  Current Problems (verified): 1)  High-risk Sexual Behavior  (ICD-V69.2) 2)  Hypertension  (ICD-401.9) 3)  Vaginitis, Bacterial, Recurrent  (ICD-616.10) 4)  Uti's, Recurrent  (ICD-599.0) 5)  Herpes, Genital Nos  (ICD-054.10) 6)  Boils, Recurrent  (ICD-680.9) 7)   Other Alopecia  (ICD-704.09) 8)  Hemorrhoids, External, With Complication  (ICD-455.5) 9)  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 Inhale 2 Puffs Every 4 - 6 Hours As Needed Shortness of Breatlh 2)  Furosemide 20 Mg Tabs (Furosemide) .... Take 1 Tablet By Mouth Once A Day 3)  Klor-Con 20 Meq Pack (Potassium Chloride) .... Take 1 Tablet By Mouth Once A Day 4)  Acyclovir 200 Mg Caps (Acyclovir) .... Take 1 Tablet By Mouth Two Times A Day 5)  Fluconazole 150 Mg Tabs (Fluconazole) .... Take 1 Tablet By Mouth Once A Day 6)  Nitrofurantoin Monohyd Macro 100 Mg Caps (Nitrofurantoin Monohyd Macro) .... Take On Half of A Tablet By Mouth Once After Sexual Intercourse 7)  Metronidazole 500 Mg Tabs (Metronidazole) .... Take 1 Tablet By Mouth Two Times A Day 8)  Ciprofloxacin Hcl 500 Mg Tabs (Ciprofloxacin Hcl) .... Take One Tablet By Mouth Two Times A Day For 7 Days, Then One Tablet As Needed After A Sexual Intercourse  Allergies (verified): 1)  ! Pcn 2)  ! Epinephrine 3)  ! * Doxycline 4)  ! *  Shellfish  Directives: 1)  Full Code   Past History:  Past Medical History: Last updated: 11/06/2007 Recurrent/Chronic Cystitis ---->Primary pathogen E. Coli with changing sensitivities- last Cx was 8/09=[facility]:sensitive to Nitrofurantoin, Cefazolin, Ceftriaxone, Gentamycin,Tobramycin only ------> CT Abd/pelvis(7/07):neg -------> Hx of W/U @[facility]  center,[place]:Dr. Reuel Boom Allergic rhinitis Hyperlipidemia High Blood pressure:occasionally                                  Not on any meds Genital herpes:Restarted Valtrex 10/09 Acne H/o Bilateral carpal tunnel syndrome 1/06 Hx of tobacco abuse:Quit 1999 H/o Cocaine abuse: Quit 1992  Family History: Last updated: 08/18/2009 mother: lung Ca (smoker) and DM  Social  History: Last updated: 08/18/2009 Occupation: has daycare center completed high school Single Former Smoker Alcohol use-no Drug use-no  Risk Factors: Alcohol Use: 0 (12/23/2009) Caffeine Use: 0 (12/23/2009) Exercise: yes (12/23/2009)  Risk Factors: Smoking Status: quit > 6 months (12/23/2009)  Physical Exam  General:  alert, oriented, , no distress. well-nourished and well-hydrated.   Eyes:  anicteric vision grossly intact.  PERRLA.EOMI. Ears:  pierced. Mouth:  MMM, appropriate dentition.  Neck:  No deformities, masses, or tenderness noted. Lungs:  Normal respiratory effort, chest expands symmetrically. Lungs are clear to auscultation, no crackles or wheezes. Heart:  Normal rate and regular rhythm. S1 and S2 normal without gallop, murmur, click, rub or other extra sounds. Abdomen:  Bowel sounds positive,abdomen soft and non-tender without masses, organomegaly or hernias noted.  No CVA tenderness. Rectal:  no suspicious lesions. Genitalia:  Nabothian cyst at 11 o'clock position noted.normal introitus, no external lesions, no vaginal discharge, mucosa pink and moist, no vaginal atrophy, no friaility or hemorrhage, normal uterus size and position, and no adnexal masses or tenderness.   Msk:  very mild tenderness on left flank below the CVA area that radiates toward the left hip Pulses:  R radial normal and L radial normal.   Extremities:  no peripheral edema.  Neurologic:  alert & oriented X3, no focal deficit. Skin:  turgor normal, color normal, and no rashes.   Psych:  Cognition and judgment appear intact. Alert and cooperative with normal attention span and concentration. No apparent delusions, illusions, hallucinations   Impression & Recommendations:  Problem # 1:  HYPERTENSION (ICD-401.9) Assessment Unchanged Low salt diet, weight managment discussed. Her updated medication list for this problem includes:    Furosemide 20 Mg Tabs (Furosemide) .Marland Kitchen... Take 1 tablet by  mouth once a day  BP today: 138/90 Prior BP: 118/74 (10/06/2009)  Labs Reviewed: K+: 3.5 (09/16/2009) Creat: : 0.73 (09/16/2009)   Chol: 230 (12/09/2007)   HDL: 60 (12/09/2007)   LDL: 142 (12/09/2007)   TG: 139 (12/09/2007)  Problem # 2:  UTI'S, RECURRENT (ICD-599.0) Assessment: Unchanged  patient has a Hx of chronic UTI and BV. Urine dipstick positive for nitrities; neg leukocytes. However, will treat since patient is symptomatic and states that her signs are similar to previous UTI that she had.  whiff test Positinve. patient is sexually active with 2 partners --does not use protection "b/c  condom latex irritates her skin." Declined  HIV test. Declined contraception counselling "because it is none of your bussiness." patient would like to try use of macrobid and if not effective -->will change back to Ciprofoxacin. Her updated medication list for this problem includes:    Nitrofurantoin Monohyd Macro 100 Mg Caps (Nitrofurantoin monohyd macro) .Marland Kitchen... Take on half of a tablet by mouth once after  sexual intercourse    Metronidazole 500 Mg Tabs (Metronidazole) .Marland Kitchen... Take 1 tablet by mouth two times a day    Ciprofloxacin Hcl 500 Mg Tabs (Ciprofloxacin hcl) .Marland Kitchen... Take one tablet by mouth two times a day for 7 days, then one tablet as needed after a sexual intercourse  Encouraged to push clear liquids, get enough rest, and take acetaminophen as needed. To be seen in 10 days if no improvement, sooner if worse.  Complete Medication List: 1)  Albuterol 90 Mcg/act Aers (Albuterol) .... Inhale 2 puffs every 4 - 6 hours as needed shortness of breatlh 2)  Furosemide 20 Mg Tabs (Furosemide) .... Take 1 tablet by mouth once a day 3)  Klor-con 20 Meq Pack (Potassium chloride) .... Take 1 tablet by mouth once a day 4)  Acyclovir 200 Mg Caps (Acyclovir) .... Take 1 tablet by mouth two times a day 5)  Fluconazole 150 Mg Tabs (Fluconazole) .... Take 1 tablet by mouth once a day 6)  Nitrofurantoin Monohyd  Macro 100 Mg Caps (Nitrofurantoin monohyd macro) .... Take on half of a tablet by mouth once after sexual intercourse 7)  Metronidazole 500 Mg Tabs (Metronidazole) .... Take 1 tablet by mouth two times a day 8)  Ciprofloxacin Hcl 500 Mg Tabs (Ciprofloxacin hcl) .... Take one tablet by mouth two times a day for 7 days, then one tablet as needed after a sexual intercourse  Other Orders: T-Lipid Profile ([id number]) T-Urine Pregnancy (in -house) [phone]) T-Wet Prep by [facility] [phone]) T-PAP [facility]) [phone])  Patient Instructions: 1)  PLEASE, RETURN TO CLINIC FASTING FOR A BLOOD WORK. 2)  pLEASE, CALL WITH ANY QUESTIONS Prescriptions: CIPROFLOXACIN HCL 500 MG TABS (CIPROFLOXACIN HCL) Take one tablet by mouth two times a day for 7 days, then one tablet as needed after a sexual intercourse  #30 x 6   Entered and Authorized by:   Deatra Robinson MD   Signed by:   Deatra Robinson MD on 12/23/2009   Method used:   Print then Give to Patient   RxID:   [id number] METRONIDAZOLE 500 MG TABS (METRONIDAZOLE) Take 1 tablet by mouth two times a day  #7 x 0   Entered and Authorized by:   Deatra Robinson MD   Signed by:   Deatra Robinson MD on 12/23/2009   Method used:   Print then Give to Patient   RxID:   [id number] KLOR-CON 20 MEQ PACK (POTASSIUM CHLORIDE) Take 1 tablet by mouth once a day  #31 x 11   Entered and Authorized by:   Deatra Robinson MD   Signed by:   Deatra Robinson MD on 12/23/2009   Method used:   Print then Give to Patient   RxID:   [id number] NITROFURANTOIN MONOHYD MACRO 100 MG CAPS (NITROFURANTOIN MONOHYD MACRO) Take On half of a tablet by mouth once after sexual intercourse  #30 x 11   Entered and Authorized by:   Deatra Robinson MD   Signed by:   Deatra Robinson MD on 12/23/2009   Method used:   Print then Give to Patient   RxID:   [id number] SULFAMETHOXAZOLE-TMP DS 800-160 MG TABS (SULFAMETHOXAZOLE-TRIMETHOPRIM) Take one tablet by mouth  after sexual intercourse  #30 x 11   Entered and Authorized by:   Deatra Robinson MD   Signed by:   Deatra Robinson MD on 12/23/2009   Method used:   Electronically to        [facility] (retail)  [address]       [place], [place]  [address]       Ph: [phone]       Fax: [phone]   RxID:   [id number] KLOR-CON 20 MEQ PACK (POTASSIUM CHLORIDE) Take 1 tablet by mouth once a day  #31 x 11   Entered and Authorized by:   Deatra Robinson MD   Signed by:   Deatra Robinson MD on 12/23/2009   Method used:   Electronically to        [facility] (retail)       [address]       [place], [place]  [address]       Ph: [phone]       Fax: [phone]   RxID:   [id number]    Orders Added: 1)  T-Lipid Profile [phone] 2)  Est. Patient Level III [32355] 3)  Est. Patient Level III [73220] 4)  T-Urine Pregnancy (in -house) [81025] 5)  T-Wet Prep by [facility] [[id number]] 6)  T-PAP [facility]) [[id number]]   Process Orders Check Orders Results:     [facility]: ABN not required for this insurance Tests Sent for requisitioning (December 23, 2009 12:37 PM):     12/23/2009: [facility] -- T-Lipid Profile [phone] (signed)     12/23/2009: [facility] -- T-Wet Prep by [facility] [phone] (signed)     Prevention & Chronic Care Immunizations   Influenza vaccine: Pt. refused  (10/20/2008)   Influenza vaccine deferral: Refused  (10/06/2009)   Influenza vaccine due: 09/15/2009    Tetanus booster: 08/18/2009: 08/2003   Td booster deferral: Not indicated  (12/23/2009)    Pneumococcal vaccine: Not documented   Pneumococcal vaccine deferral: Deferred  (07/20/2008)  Other Screening   Pap smear: NEGATIVE FOR INTRAEPITHELIAL LESIONS OR MALIGNANCY.  (10/20/2008)   Pap smear action/deferral: Ordered  (10/20/2008)   Pap smear due:  10/21/2011    Mammogram: ASSESSMENT: Negative - BI-RADS 1^MS DIGITAL SCREENING  (02/11/2009)   Mammogram action/deferral: Deferred  (08/18/2009)   Mammogram due: 08/11/2009   Smoking status: quit > 6 months  (12/23/2009)  Lipids   Total Cholesterol: 230  (12/09/2007)   Lipid panel action/deferral: Lipid Panel ordered   LDL: 142  (12/09/2007)   LDL Direct: Not documented   HDL: 60  (12/09/2007)   Triglycerides: 139  (12/09/2007)   Lipid panel due: 12/24/2010  Hypertension   Last Blood Pressure: 138 / 90  (12/23/2009)   Serum creatinine: 0.73  (09/16/2009)   BMP action: Ordered   Serum potassium 3.5  (09/16/2009)   Basic metabolic panel due: 06/24/2010    Hypertension flowsheet reviewed?: Yes   Progress toward BP goal: Unchanged    Stage of readiness to change (hypertension management): Maintenance  Self-Management Support :   Personal Goals (by the next clinic visit) :      Personal blood pressure goal: 140/90  (02/22/2009)   Patient will work on the following items until the next clinic visit to reach self-care goals:     Medications and monitoring: take my medicines every day, bring all of my medications to every visit  (12/23/2009)     Eating: drink diet soda or water instead of juice or soda, eat more vegetables, use fresh or frozen vegetables, eat foods that are low in salt, eat baked foods instead of fried foods, eat fruit  for snacks and desserts, limit or avoid alcohol  (12/23/2009)     Activity: take a 30 minute walk every day  (12/23/2009)    Hypertension self-management support: Resources for patients handout  (12/23/2009)      Resource handout printed.   Nursing Instructions: Schedule screening mammogram (see order)    Laboratory Results   Urine Tests  Date/Time Received: December 23, 2009 11:26 AM Date/Time Reported: Burke Keels  December 23, 2009 11:26 AM LELA STURDIVANT NTII  Routine Urinalysis   Color: yellow Appearance: Hazy Glucose:  negative   (Normal Range: Negative) Bilirubin: negative   (Normal Range: Negative) Ketone: negative   (Normal Range: Negative) Spec. Gravity: 1.015   (Normal Range: 1.003-1.035) Blood: negative   (Normal Range: Negative) pH: 7.0   (Normal Range: 5.0-8.0) Protein: negative   (Normal Range: Negative) Urobilinogen: 0.2   (Normal Range: 0-1) Nitrite: positive   (Normal Range: Negative) Leukocyte Esterace: negative   (Normal Range: Negative)    Urine HCG: negative

## 2010-02-23 ENCOUNTER — Ambulatory Visit (INDEPENDENT_AMBULATORY_CARE_PROVIDER_SITE_OTHER): Payer: BC Managed Care – PPO | Admitting: Ophthalmology

## 2010-02-23 ENCOUNTER — Encounter: Payer: Self-pay | Admitting: Ophthalmology

## 2010-02-23 VITALS — BP 134/88 | HR 75 | Temp 97.2°F | Ht 66.0 in | Wt 186.7 lb

## 2010-02-23 DIAGNOSIS — L253 Unspecified contact dermatitis due to other chemical products: Secondary | ICD-10-CM

## 2010-02-23 DIAGNOSIS — J45909 Unspecified asthma, uncomplicated: Secondary | ICD-10-CM

## 2010-02-23 DIAGNOSIS — L0292 Furuncle, unspecified: Secondary | ICD-10-CM

## 2010-02-23 DIAGNOSIS — Z0000 Encounter for general adult medical examination without abnormal findings: Secondary | ICD-10-CM | POA: Insufficient documentation

## 2010-02-23 DIAGNOSIS — M542 Cervicalgia: Secondary | ICD-10-CM

## 2010-02-23 DIAGNOSIS — N760 Acute vaginitis: Secondary | ICD-10-CM

## 2010-02-23 DIAGNOSIS — I10 Essential (primary) hypertension: Secondary | ICD-10-CM

## 2010-02-23 DIAGNOSIS — K644 Residual hemorrhoidal skin tags: Secondary | ICD-10-CM

## 2010-02-23 DIAGNOSIS — IMO0002 Reserved for concepts with insufficient information to code with codable children: Secondary | ICD-10-CM | POA: Insufficient documentation

## 2010-02-23 DIAGNOSIS — A6000 Herpesviral infection of urogenital system, unspecified: Secondary | ICD-10-CM

## 2010-02-23 DIAGNOSIS — J309 Allergic rhinitis, unspecified: Secondary | ICD-10-CM

## 2010-02-23 DIAGNOSIS — K5909 Other constipation: Secondary | ICD-10-CM

## 2010-02-23 DIAGNOSIS — L658 Other specified nonscarring hair loss: Secondary | ICD-10-CM

## 2010-02-23 DIAGNOSIS — L0293 Carbuncle, unspecified: Secondary | ICD-10-CM

## 2010-02-23 DIAGNOSIS — Z113 Encounter for screening for infections with a predominantly sexual mode of transmission: Secondary | ICD-10-CM | POA: Insufficient documentation

## 2010-02-23 DIAGNOSIS — N390 Urinary tract infection, site not specified: Secondary | ICD-10-CM

## 2010-02-23 MED ORDER — ALBUTEROL 90 MCG/ACT IN AERS: 2.0000 | INHALATION_SPRAY | Freq: Four times a day (QID) | RESPIRATORY_TRACT | Status: DC | PRN

## 2010-02-23 MED ORDER — CYCLOBENZAPRINE HCL 5 MG PO TABS: 5.0000 mg | ORAL_TABLET | Freq: Three times a day (TID) | ORAL | Status: DC | PRN

## 2010-02-23 MED ORDER — FLUCONAZOLE 150 MG PO TABS: 150.0000 mg | ORAL_TABLET | Freq: Once | ORAL | Status: AC

## 2010-02-23 NOTE — Assessment & Plan Note (Signed)
The patients blood pressure was within reasonable control today (BP: 134/88 mmHg ) and I will not make any adjustments to the patients anti-hypertensive regimen. I will continue to monitor and titrate the patients medications as needed at future visits.

## 2010-02-23 NOTE — Patient Instructions (Signed)
You may take flexeril for you pain for the short term.  If you continue to have pain or any other neurologic symptoms such as weakness or numbness, please call the clinic or go to the ED.  If you are still in pain in 3 weeks call and I will get you an appointment with sports medicine.

## 2010-02-23 NOTE — Progress Notes (Signed)
Subjective:    Patient ID: Carrie Franco, female    DOB: [birth year], 45 y.o.   MRN: [id number]  HPI  this is a 45 year old with a past medical history significant for hypertension  and recurrent UTIs,  Who presents because of a concern that she has a pinched nerve in her back.  This has been ongoing for about 2 weeks now, and the patient feels that it's because of increased  Working out that she has been doing lately. The patient had blood work done on January [facility] at the [facility] on [address]. At that time a fasting lipid panel was performed which demonstrated a total cholesterol of 231 and an LDL of 155. The patient was not started on a statin but was instructed to increase the amount of exercise and dieting she's been doing. Patient has taken this to heart , and has lost 8 pounds through diet and exercise. The patient states about 2 weeks ago she woke up in the morning and felt like she had a crick in her neck, this was on the left side. After several days, the pain moved over to the right side. The patient has had no radiation into her arms and the pain is only exacerbated by movement. The patient denies any red flags such as fevers or chills, and has had no difficulty  manipulating small objects.  Yesterday, the patient took a friend's Flexeril which brought the pain down from 10 out of 10-7/10. The patient's only other concern today, it is that she needs medication refill. Currently, the patient denies any symptoms of UTI, or vaginitis. Review of Systems  Constitutional: Negative for fever and chills.  Respiratory: Negative for cough and shortness of breath.   Cardiovascular: Negative for chest pain and palpitations.  Gastrointestinal: Negative for vomiting, diarrhea and constipation.       Objective:   Physical Exam  Constitutional: She appears well-developed and well-nourished.  HENT:  Head: Normocephalic and atraumatic.  Eyes: Pupils are equal, round, and reactive to  light.  Cardiovascular: Normal rate, regular rhythm and intact distal pulses.  Exam reveals no gallop and no friction rub.   No murmur heard. Pulmonary/Chest: Effort normal and breath sounds normal. She has no wheezes. She has no rales.  Abdominal: Soft. Bowel sounds are normal. She exhibits no distension. There is no tenderness.  Musculoskeletal:        The patient complains of pain with neck rotation, but has full range of movement.  The patient has diffuse tenderness to palpation over the paraspinal muscles , trapezius, and Muscles overlying the scapulae.  The patient has no bony point tenderness. Sensation is normal, muscle strength in the upper extremities is normal. The patient has normal rapid alternating movements. Reflexes are 2+ and equal.  Neurological: She is alert. No cranial nerve deficit.  Skin: No rash noted.    Current outpatient prescriptions:acyclovir (ZOVIRAX) 200 MG capsule, Take 200 mg by mouth 2 (two) times daily.  , Disp: , Rfl: ;  ciprofloxacin (CIPRO) 500 MG tablet, Take 500 mg by mouth once. Take within 2 hours of sex. , Disp: , Rfl: ;  cyclobenzaprine (FLEXERIL) 5 MG tablet, Take 1 tablet (5 mg total) by mouth 3 (three) times daily as needed., Disp: 90 tablet, Rfl: 0 fluconazole (DIFLUCAN) 150 MG tablet, Take 1 tablet (150 mg total) by mouth once. Take 1 tab once weekly., Disp: 5 tablet, Rfl: 5;  furosemide (LASIX) 20 MG tablet, Take 20 mg by mouth  daily.  , Disp: , Rfl: ;  potassium chloride (KLOR-CON) 20 MEQ packet, Take 20 mEq by mouth daily.  , Disp: , Rfl: ;  DISCONTD: cyclobenzaprine (FLEXERIL) 5 MG tablet, Take 5 mg by mouth 3 (three) times daily as needed.  , Disp: , Rfl:  DISCONTD: fluconazole (DIFLUCAN) 150 MG tablet, Take 150 mg by mouth once. Take 1 tab once weekly. , Disp: , Rfl: ;  albuterol (PROVENTIL,VENTOLIN) 90 MCG/ACT inhaler, Inhale 2 puffs into the lungs every 6 (six) hours as needed for Wheezing., Disp: 1 each, Rfl: 6;  metroNIDAZOLE (FLAGYL) 500 MG  tablet, Take 500 mg by mouth 2 (two) times daily.  , Disp: , Rfl:  DISCONTD: albuterol (PROVENTIL,VENTOLIN) 90 MCG/ACT inhaler, Inhale 2 puffs into the lungs every 6 (six) hours as needed for Wheezing., Disp: 17 g, Rfl: 12  Past Medical History  Diagnosis Date  . High risk sexual behavior   . Hypertension   . Bacterial vaginitis     recurrent  . Recurrent UTI   . Genital herpes   . Recurrent boils   . External hemorrhoids with complication   . Allergic rhinitis   . Chronic constipation     Assessment & Plan:

## 2010-02-23 NOTE — Assessment & Plan Note (Signed)
The patient currently denies any new lesions, and is taking her acyclovir regularly.

## 2010-02-23 NOTE — Assessment & Plan Note (Signed)
This is stable and does not appear to be worsening but has remained I do not feel that this is secondary to a radiculopathy, and given her diffuse muscle tenderness feel that is likely secondary 2 musculoskeletal pain. This is made more likley by the fact that the patient has had a recent drastic increase in her exercise regemin.  I would recommend conservative measures at this time , but given her improvement with Flexeril, I prescribed her 2 weeks of this medication. I instructed the patient that she should take it easy with regards to upper extremity exercise over the next few weeks, but I do recommend that she continue her cardiovascular exercise. I instructed the patient to call back she does not see any improvement in her symptoms over the next few weeks and I would recommend a sports medicine referral at that time if that is the case. There were no warning symptoms at this time as mentioned in the history of present illness.

## 2010-02-23 NOTE — Assessment & Plan Note (Signed)
The patient reports that she has asthma and this is the reason for her being on albuterol. The patient denies any significant recent shortness of breath but continues to use her albuterol on occasion and requires a refill today. Asthma was not noted in her past medical history however she was indeed on albuterol. I would recommend consideration of pulmonary function tests in the future for further evidence of her pulmonary status.

## 2010-02-23 NOTE — Assessment & Plan Note (Addendum)
This is stable, the patient currently denies any dysuria. The patient does take ciprofloxacin within 2 hours of sexual intercourse to avoid recurrent UTIs. The patient also requires weekly treatment with Diflucan in order to avoid yeast infections. I will continue both of these medications. The patient has  finish her last course of nitrofurantoin.

## 2010-03-10 ENCOUNTER — Inpatient Hospital Stay (INDEPENDENT_AMBULATORY_CARE_PROVIDER_SITE_OTHER)
Admission: RE | Admit: 2010-03-10 | Discharge: 2010-03-10 | Disposition: A | Discharge status: Home / Self Care | Payer: BC Managed Care – PPO | Source: Ambulatory Visit | Attending: Emergency Medicine | Admitting: Emergency Medicine

## 2010-03-10 ENCOUNTER — Other Ambulatory Visit: Payer: Self-pay | Admitting: Family Medicine

## 2010-03-10 DIAGNOSIS — N760 Acute vaginitis: Secondary | ICD-10-CM

## 2010-03-10 DIAGNOSIS — B373 Candidiasis of vulva and vagina: Secondary | ICD-10-CM

## 2010-03-10 DIAGNOSIS — Z1231 Encounter for screening mammogram for malignant neoplasm of breast: Secondary | ICD-10-CM

## 2010-03-10 DIAGNOSIS — A499 Bacterial infection, unspecified: Secondary | ICD-10-CM

## 2010-03-10 LAB — WET PREP, GENITAL
Trich, Wet Prep: NONE SEEN
Yeast Wet Prep HPF POC: NONE SEEN

## 2010-03-10 LAB — POCT URINALYSIS DIPSTICK
Specific Gravity, Urine: 1.025 (ref 1.005–1.030)
Urine Glucose, Fasting: NEGATIVE mg/dL
Urobilinogen, UA: 0.2 mg/dL (ref 0.0–1.0)
pH: 5 (ref 5.0–8.0)

## 2010-03-11 LAB — GC/CHLAMYDIA PROBE AMP, GENITAL: GC Probe Amp, Genital: NEGATIVE

## 2010-03-15 ENCOUNTER — Ambulatory Visit (HOSPITAL_COMMUNITY): Payer: BC Managed Care – PPO | Attending: Family Medicine

## 2010-03-16 HISTORY — PX: OTHER SURGICAL HISTORY: SHX169

## 2010-03-21 ENCOUNTER — Inpatient Hospital Stay (INDEPENDENT_AMBULATORY_CARE_PROVIDER_SITE_OTHER)
Admission: RE | Admit: 2010-03-21 | Discharge: 2010-03-21 | Disposition: A | Discharge status: Home / Self Care | Payer: BC Managed Care – PPO | Source: Ambulatory Visit | Attending: Family Medicine | Admitting: Family Medicine

## 2010-03-21 DIAGNOSIS — N760 Acute vaginitis: Secondary | ICD-10-CM

## 2010-03-21 DIAGNOSIS — N390 Urinary tract infection, site not specified: Secondary | ICD-10-CM

## 2010-03-21 LAB — POCT URINALYSIS DIPSTICK
Nitrite: POSITIVE — AB
Protein, ur: NEGATIVE mg/dL
pH: 7 (ref 5.0–8.0)

## 2010-04-01 LAB — POCT I-STAT, CHEM 8
BUN: 12 mg/dL (ref 6–23)
Chloride: 106 mEq/L (ref 96–112)
Creatinine, Ser: 0.9 mg/dL (ref 0.4–1.2)
Potassium: 3.9 mEq/L (ref 3.5–5.1)
Sodium: 137 mEq/L (ref 135–145)

## 2010-04-02 LAB — URINALYSIS, ROUTINE W REFLEX MICROSCOPIC
Ketones, ur: NEGATIVE mg/dL
Nitrite: NEGATIVE
Specific Gravity, Urine: 1.025 (ref 1.005–1.030)
pH: 7.5 (ref 5.0–8.0)

## 2010-04-02 LAB — POCT PREGNANCY, URINE
Preg Test, Ur: POSITIVE
Preg Test, Ur: NEGATIVE

## 2010-04-02 LAB — URINE MICROSCOPIC-ADD ON

## 2010-04-02 LAB — WET PREP, GENITAL

## 2010-04-04 LAB — DIFFERENTIAL
Lymphocytes Relative: 10 % — ABNORMAL LOW (ref 12–46)
Monocytes Absolute: 0.4 10*3/uL (ref 0.1–1.0)
Monocytes Relative: 7 % (ref 3–12)
Neutro Abs: 5.1 10*3/uL (ref 1.7–7.7)
Neutrophils Relative %: 82 % — ABNORMAL HIGH (ref 43–77)

## 2010-04-04 LAB — URINALYSIS, ROUTINE W REFLEX MICROSCOPIC
Ketones, ur: NEGATIVE mg/dL
Leukocytes, UA: NEGATIVE
Nitrite: NEGATIVE
Urobilinogen, UA: 1 mg/dL (ref 0.0–1.0)
pH: 7.5 (ref 5.0–8.0)

## 2010-04-04 LAB — CBC
HCT: 36.2 % (ref 36.0–46.0)
MCHC: 33.3 g/dL (ref 30.0–36.0)
MCV: 91.4 fL (ref 78.0–100.0)
Platelets: 235 10*3/uL (ref 150–400)
RDW: 13.2 % (ref 11.5–15.5)

## 2010-04-04 LAB — COMPREHENSIVE METABOLIC PANEL
Albumin: 4.1 g/dL (ref 3.5–5.2)
BUN: 15 mg/dL (ref 6–23)
Calcium: 8.8 mg/dL (ref 8.4–10.5)
Creatinine, Ser: 0.69 mg/dL (ref 0.4–1.2)
Potassium: 4 mEq/L (ref 3.5–5.1)
Total Protein: 7.3 g/dL (ref 6.0–8.3)

## 2010-04-04 LAB — URINE MICROSCOPIC-ADD ON

## 2010-04-04 LAB — POCT PREGNANCY, URINE: Preg Test, Ur: NEGATIVE

## 2010-04-26 ENCOUNTER — Ambulatory Visit: Payer: BC Managed Care – PPO | Admitting: Internal Medicine

## 2010-04-26 ENCOUNTER — Encounter: Payer: Self-pay | Admitting: Internal Medicine

## 2010-04-26 ENCOUNTER — Ambulatory Visit (INDEPENDENT_AMBULATORY_CARE_PROVIDER_SITE_OTHER): Payer: BC Managed Care – PPO | Admitting: Internal Medicine

## 2010-04-26 VITALS — BP 110/74 | HR 90 | Temp 97.7°F | Ht 63.0 in | Wt 178.8 lb

## 2010-04-26 DIAGNOSIS — N390 Urinary tract infection, site not specified: Secondary | ICD-10-CM

## 2010-04-26 MED ORDER — FLUCONAZOLE 150 MG PO TABS: 150.0000 mg | ORAL_TABLET | Freq: Once | ORAL | Status: AC

## 2010-04-26 MED ORDER — CIPROFLOXACIN HCL 100 MG PO TABS: ORAL_TABLET | ORAL | Status: DC

## 2010-04-26 NOTE — Progress Notes (Signed)
  Subjective:    Patient ID: Carrie Franco, female    DOB: [birth year], 45 y.o.   MRN: [id number]  HPI Patient is a 45 year old female with a past medical history of multiple UTIs, she was placed on suppressive therapy with ciprofloxacin 500 mg post intercourse, and with that she is still getting UTIs today she complains of dysuria. She denies any flank pain. Fever or chills or any other complaints.   Review of Systems  Genitourinary: Positive for dysuria and frequency.  [all other systems reviewed and are negative       Objective:   Physical Exam  [vitalsreviewed. Constitutional: She is oriented to person, place, and time. She appears well-developed and well-nourished.  HENT:  Head: Normocephalic and atraumatic.  Eyes: Pupils are equal, round, and reactive to light.  Neck: Normal range of motion. Neck supple. No JVD present. No thyromegaly present.  Cardiovascular: Normal rate, regular rhythm and normal heart sounds.   No murmur heard. Pulmonary/Chest: Effort normal and breath sounds normal. She has no wheezes. She has no rales.  Abdominal: Soft. Bowel sounds are normal.  Genitourinary: Vaginal discharge found.  Musculoskeletal: Normal range of motion. She exhibits no edema.  Neurological: She is alert and oriented to person, place, and time.  Skin: Skin is warm and dry.          Assessment & Plan:

## 2010-04-26 NOTE — Patient Instructions (Signed)
Urinary Tract Infection (UTI) Infections of the urinary tract can start in several places. A bladder infection (cystitis), a kidney infection (pyelonephritis), and a prostate infection (prostatitis) are different types of urinary tract infections. They usually get better if treated with medicines (antibiotics) that kill germs. Take all the medicine until it is gone. You or your child may feel better in a few days, but TAKE ALL MEDICINE or the infection may not respond and may become more difficult to treat. HOME CARE INSTRUCTIONS  Drink enough water and fluids to keep the urine clear or pale yellow. Cranberry juice is especially recommended, in addition to large amounts of water.   Avoid caffeine, tea, and carbonated beverages. They tend to irritate the bladder.   Alcohol may irritate the prostate.   Only take over-the-counter or prescription medicines for pain, discomfort, or fever as directed by your caregiver.  FINDING OUT THE RESULTS OF YOUR TEST Not all test results are available during your visit. If your or your child's test results are not back during the visit, make an appointment with your caregiver to find out the results. Do not assume everything is normal if you have not heard from your caregiver or the medical facility. It is important for you to follow up on all test results. TO PREVENT FURTHER INFECTIONS:  Empty the bladder often. Avoid holding urine for long periods of time.   After a bowel movement, women should cleanse from front to back. Use each tissue only once.   Empty the bladder before and after sexual intercourse.  SEEK MEDICAL CARE IF:  There is back pain.   You or your child has an oral temperature above 100.4.   Your baby is older than 3 months with a rectal temperature of 100.5 F (38.1 C) or higher for more than 1 day.   Your or your child's problems (symptoms) are no better in 3 days. Return sooner if you or your child is getting worse.  SEEK IMMEDIATE  MEDICAL CARE IF:  There is severe back pain or lower abdominal pain.   You or your child develops chills.   You or your child has an oral temperature above 101, not controlled by medicine.   Your baby is older than 3 months with a rectal temperature of 102 F (38.9 C) or higher.   Your baby is 35 months old or younger with a rectal temperature of 100.4 F (38 C) or higher.   There is nausea or vomiting.   There is continued burning or discomfort with urination.  MAKE SURE YOU:  Understand these instructions.   Will watch this condition.   Will get help right away if you or your child is not doing well or gets worse.  Document Released: 10/11/2004 Document Re-Released: 03/28/2009 [facility] Patient Information 2011 [place], [place].

## 2010-04-26 NOTE — Assessment & Plan Note (Addendum)
Inpatient history is of recurrent UTIs I will start her on suppressive dose of ciprofloxacin 100 mg for at least 6 months, and order a urinalysis and urine culture today. If patient has a UTI while on the Cipro we may need to change to nitrofurantoin 50-100 mg daily.   Addendum pharmacy did not have Cipro therefore I have given a prescription for nitrofurantoin 100 mg daily.

## 2010-04-27 ENCOUNTER — Telehealth: Payer: Self-pay | Admitting: *Deleted

## 2010-04-27 ENCOUNTER — Other Ambulatory Visit: Payer: Self-pay | Admitting: Internal Medicine

## 2010-04-27 DIAGNOSIS — N390 Urinary tract infection, site not specified: Secondary | ICD-10-CM

## 2010-04-27 LAB — URINALYSIS, ROUTINE W REFLEX MICROSCOPIC
Bilirubin Urine: NEGATIVE
Glucose, UA: NEGATIVE mg/dL
Leukocytes, UA: NEGATIVE
Specific Gravity, Urine: 1.024 (ref 1.005–1.030)
Urobilinogen, UA: 1 mg/dL (ref 0.0–1.0)

## 2010-04-27 LAB — URINALYSIS, MICROSCOPIC ONLY

## 2010-04-27 MED ORDER — NITROFURANTOIN MONOHYD MACRO 100 MG PO CAPS: 100.0000 mg | ORAL_CAPSULE | Freq: Every day | ORAL | Status: DC

## 2010-04-27 NOTE — Progress Notes (Signed)
Addended by: Darnelle Maffucci on: 04/27/2010 12:21 PM   Modules accepted: Orders

## 2010-04-27 NOTE — Telephone Encounter (Signed)
I spoke with the patient regarding this I have switched her medications nitrofurantoin 100 mg daily prescriptions already been sent

## 2010-04-27 NOTE — Telephone Encounter (Signed)
Pt calls and states the pharmacy cannot fill her cipro, it should be 250mg  per pharmacist. Could you please review and i will call in for you to [facility]

## 2010-04-29 LAB — URINE CULTURE: Colony Count: >=100000

## 2010-05-01 ENCOUNTER — Telehealth: Payer: Self-pay | Admitting: Internal Medicine

## 2010-05-01 DIAGNOSIS — N390 Urinary tract infection, site not specified: Secondary | ICD-10-CM

## 2010-05-01 MED ORDER — SULFAMETHOXAZOLE-TRIMETHOPRIM 400-80 MG PO TABS: ORAL_TABLET | ORAL | Status: DC

## 2010-05-01 MED ORDER — SULFAMETHOXAZOLE-TRIMETHOPRIM 400-80 MG PO TABS: 1.0000 | ORAL_TABLET | ORAL | Status: DC

## 2010-05-01 NOTE — Telephone Encounter (Signed)
Patient will be treated for 6 months with prophylactic treatment for recurrent UTIs, patient was initially given ciprofloxacin 100 mg daily for the pharmacy did not carry that dose, therefore she was prescribed nitrofurantoin 100 mg daily, today patient's urine culture came back and showed resistance to Cipro and nitrofurantoin and sensitivity to the TMP-SMX. I have called patient and informed her of this and sent a prescription for TMP-SMX 40/200 mg 3 times a week for 6 months. I informed patient of risk of getting pregnant on Bactrim. Patient voices understanding. 

## 2010-05-01 NOTE — Assessment & Plan Note (Addendum)
Patient will be treated for 6 months with prophylactic treatment for recurrent UTIs, patient was initially given ciprofloxacin 100 mg daily for the pharmacy did not carry that dose, therefore she was prescribed nitrofurantoin 100 mg daily, today patient's urine culture came back and showed resistance to Cipro and nitrofurantoin and sensitivity to the TMP-SMX. I have called patient and informed her of this and sent a prescription for TMP-SMX 40/200 mg 3 times a week for 6 months. I informed patient of risk of getting pregnant on Bactrim. Patient voices understanding.

## 2010-05-22 ENCOUNTER — Other Ambulatory Visit: Payer: Self-pay | Admitting: *Deleted

## 2010-05-22 DIAGNOSIS — I10 Essential (primary) hypertension: Secondary | ICD-10-CM

## 2010-05-22 MED ORDER — FUROSEMIDE 20 MG PO TABS: 20.0000 mg | ORAL_TABLET | Freq: Every day | ORAL | Status: DC

## 2010-06-09 ENCOUNTER — Other Ambulatory Visit: Payer: Self-pay | Admitting: Internal Medicine

## 2010-06-09 NOTE — Telephone Encounter (Signed)
I do not believe Carrie Franco needs to continue tx with Lasix.  WIll not submit a refill.

## 2010-06-13 ENCOUNTER — Inpatient Hospital Stay (INDEPENDENT_AMBULATORY_CARE_PROVIDER_SITE_OTHER)
Admission: RE | Admit: 2010-06-13 | Discharge: 2010-06-13 | Disposition: A | Discharge status: Home / Self Care | Payer: Self-pay | Source: Ambulatory Visit | Attending: Family Medicine | Admitting: Family Medicine

## 2010-06-13 DIAGNOSIS — N760 Acute vaginitis: Secondary | ICD-10-CM

## 2010-06-13 DIAGNOSIS — L259 Unspecified contact dermatitis, unspecified cause: Secondary | ICD-10-CM

## 2010-06-14 LAB — GC/CHLAMYDIA PROBE AMP, GENITAL: Chlamydia, DNA Probe: NEGATIVE

## 2010-06-20 ENCOUNTER — Encounter: Payer: Self-pay | Admitting: Internal Medicine

## 2010-06-22 ENCOUNTER — Other Ambulatory Visit: Payer: Self-pay | Admitting: Internal Medicine

## 2010-07-14 ENCOUNTER — Ambulatory Visit (HOSPITAL_COMMUNITY)
Admission: RE | Admit: 2010-07-14 | Discharge: 2010-07-14 | Disposition: A | Discharge status: Home / Self Care | Payer: Self-pay | Source: Ambulatory Visit | Attending: Family Medicine | Admitting: Family Medicine

## 2010-07-14 DIAGNOSIS — Z1231 Encounter for screening mammogram for malignant neoplasm of breast: Secondary | ICD-10-CM

## 2010-07-27 ENCOUNTER — Other Ambulatory Visit: Payer: Self-pay | Admitting: Internal Medicine

## 2010-09-14 ENCOUNTER — Inpatient Hospital Stay (INDEPENDENT_AMBULATORY_CARE_PROVIDER_SITE_OTHER)
Admission: RE | Admit: 2010-09-14 | Discharge: 2010-09-14 | Disposition: A | Discharge status: Home / Self Care | Payer: Self-pay | Source: Ambulatory Visit | Attending: Emergency Medicine | Admitting: Emergency Medicine

## 2010-09-14 DIAGNOSIS — N760 Acute vaginitis: Secondary | ICD-10-CM

## 2010-09-14 LAB — WET PREP, GENITAL
Clue Cells Wet Prep HPF POC: NONE SEEN
Trich, Wet Prep: NONE SEEN

## 2010-09-15 LAB — GC/CHLAMYDIA PROBE AMP, GENITAL: Chlamydia, DNA Probe: NEGATIVE

## 2010-09-21 ENCOUNTER — Encounter: Payer: Self-pay | Admitting: Internal Medicine

## 2010-09-21 ENCOUNTER — Ambulatory Visit (INDEPENDENT_AMBULATORY_CARE_PROVIDER_SITE_OTHER): Payer: Self-pay | Admitting: Internal Medicine

## 2010-09-21 DIAGNOSIS — A6000 Herpesviral infection of urogenital system, unspecified: Secondary | ICD-10-CM

## 2010-09-21 DIAGNOSIS — J069 Acute upper respiratory infection, unspecified: Secondary | ICD-10-CM

## 2010-09-21 DIAGNOSIS — I10 Essential (primary) hypertension: Secondary | ICD-10-CM

## 2010-09-21 DIAGNOSIS — N760 Acute vaginitis: Secondary | ICD-10-CM

## 2010-09-21 MED ORDER — ACYCLOVIR 200 MG PO CAPS: 200.0000 mg | ORAL_CAPSULE | Freq: Two times a day (BID) | ORAL | Status: DC

## 2010-09-21 MED ORDER — DESONIDE 0.05 % EX CREA: TOPICAL_CREAM | Freq: Two times a day (BID) | CUTANEOUS | Status: DC

## 2010-09-21 NOTE — Progress Notes (Signed)
Subjective:   Patient ID: Carrie Franco female   DOB: Mar 05, 1965 45 y.o.   MRN: [id number]  HPI: [name] is a 45 y.o. woman who presents to clinic today to follow up her urgent care visit and because she has been having pressure and drainage from her sinus since yesterday.    She states that when she made the appointment she was having vaginal discharge with itching for a few days prior.  She went to urgent care and they diagnosed her with recurrent BV and gave her doxycycline and metronidazole.  Since starting the medication she has been doing well.   She noted some sinus problems started the day prior to this visit and then this morning woke up with nasal congestion.  She has some clear drainage from the nose as well as down the back of the throat.  She has some pain over her cheeks and bridge of her nose today as well.  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   Past Medical History  Diagnosis Date  . High risk sexual behavior   . Hypertension   . Bacterial vaginitis     recurrent  . Recurrent UTI   . Genital herpes   . Recurrent boils   . External hemorrhoids with complication   . Allergic rhinitis   . Chronic constipation   . History of cocaine abuse 1992  . History of tobacco abuse 1999  . Bilateral carpal tunnel syndrome   . Acne   . Hyperlipidemia    Current Outpatient Prescriptions  Medication Sig Dispense Refill  . acyclovir (ZOVIRAX) 200 MG capsule Take by mouth 2 (two) times daily.        Marland Kitchen albuterol (PROVENTIL,VENTOLIN) 90 MCG/ACT inhaler Inhale 2 puffs into the lungs every 6 (six) hours as needed for Wheezing.  1 each  6  . ciprofloxacin (CIPRO) 500 MG tablet Take 500 mg by mouth 2 (two) times daily. Take one tablet by mouth times times a day for 7 days, then one tablet as needed after sexual intercourse       . fluconazole (DIFLUCAN) 150 MG tablet Take 150 mg by mouth daily.        . furosemide (LASIX) 20 MG tablet  TAKE ONE TABLET BY MOUTH EVERY DAY  90 tablet  2  . metroNIDAZOLE (FLAGYL) 500 MG tablet Take 500 mg by mouth 2 (two) times daily.        . nitrofurantoin (MACRODANTIN) 50 MG capsule Take 50 mg by mouth. Take one tablet by mouth daily or as instructed after sexual activity       . potassium chloride (KLOR-CON) 20 MEQ packet Take 20 mEq by mouth daily.        Marland Kitchen sulfamethoxazole-trimethoprim (BACTRIM,SEPTRA) 400-80 MG per tablet Take one tablet by mouth three times a week, on Monday, Wednesday and Friday for 6 months.  12 tablet  6   Family History  Problem Relation Age of Onset  . Cancer Mother   . Diabetes Mother    History   Social History  . Marital Status: Single    Spouse Name: N/A    Number of Children: N/A  . Years of Education: N/A   Social History Main Topics  . Smoking status: Former Smoker    Types: Cigarettes  . Smokeless tobacco: None   Comment: quit 58yrs ago  . Alcohol Use: No  . Drug Use: No  . Sexually Active:  None   Other Topics Concern  . None   Social History Narrative    The patient works at a daycare center, she completed high school, is single   Review of Systems: Constitutional: Denies fever, chills, diaphoresis, appetite change and fatigue.  HEENT: Positive for congestion, sore throat, rhinorrhea, sneezing Denies photophobia, eye pain, redness, hearing loss, ear pain,  mouth sores, trouble swallowing, neck pain, neck stiffness and tinnitus.   Respiratory: Denies SOB, DOE, cough, chest tightness,  and wheezing.   Cardiovascular: Denies chest pain, palpitations and leg swelling.  Gastrointestinal: Denies nausea, vomiting, abdominal pain, diarrhea, constipation, blood in stool and abdominal distention.  Genitourinary: Denies dysuria, urgency, frequency, hematuria, flank pain and difficulty urinating.  Musculoskeletal: Denies myalgias, back pain, joint swelling, arthralgias and gait problem.  Skin: Denies pallor, rash and wound.  Neurological: Denies  dizziness, seizures, syncope, weakness, light-headedness, numbness and headaches.  Hematological: Denies adenopathy. Easy bruising, personal or family bleeding history  Psychiatric/Behavioral: Denies suicidal ideation, mood changes, confusion, nervousness, sleep disturbance and agitation  Objective:  Physical Exam: Filed Vitals:   09/21/10 0828  BP: 149/88  Pulse: 80  Temp: 97.4 F (36.3 C)  TempSrc: Oral  Height: 5\' 3"  (1.6 m)  Weight: 172 lb 8 oz (78.245 kg)  SpO2: 100%   Constitutional: Vital signs reviewed.  Patient is a well-developed and well-nourished woman in no acute distress and cooperative with exam. Alert and oriented x3.  Head: Normocephalic and atraumatic Ear: TM are nonerythematous with mild swelling noted.  Mouth: There is mild posterior pharynx erythema but no cobble stoning or exudates, MMM Eyes: PERRL, EOMI, conjunctivae normal, No scleral icterus.  Neck: Supple, Trachea midline normal ROM, No JVD, mass, thyromegaly, or carotid bruit present.  Cardiovascular: RRR, S1 normal, S2 normal, no MRG, pulses symmetric and intact bilaterally Pulmonary/Chest: CTAB, no wheezes, rales, or rhonchi Abdominal: Soft. Non-tender, non-distended, bowel sounds are normal, no masses, organomegaly, or guarding present.  GU: no CVA tenderness Musculoskeletal: No joint deformities, erythema, or stiffness, ROM full and no nontender Hematology: no cervical, inginal, or axillary adenopathy.   Assessment & Plan:

## 2010-09-21 NOTE — Patient Instructions (Signed)
1. For the congestion you can use Mucinex-D (generic name is Guaifenesin).  Take it as the instructions on the back.  2. Continue all your other medications as prescribed.  3. Follow up in 6 months.

## 2010-09-25 NOTE — Assessment & Plan Note (Signed)
Her symptoms are consistent with a viral URI.  We will treat symptomatically with decongestant etc.

## 2010-09-25 NOTE — Assessment & Plan Note (Signed)
She has a history of recurrent BV.  She is responding well to treatment.  We will continue to monitor and support her.

## 2010-09-25 NOTE — Assessment & Plan Note (Signed)
She has approximately 1-2 genital outbreaks per year.  We will give her a refill for her.

## 2010-09-25 NOTE — Assessment & Plan Note (Signed)
BP Readings from Last 3 Encounters:  09/21/10 149/88  04/26/10 110/74  02/23/10 134/88   Her blood pressure is mildly increased today.  She states that she has been taking her medications .  She has been better controlled in the past and this likely indicates her acute illness.  We will continue to follow.

## 2010-10-12 ENCOUNTER — Other Ambulatory Visit: Payer: Self-pay | Admitting: *Deleted

## 2010-10-12 DIAGNOSIS — L309 Dermatitis, unspecified: Secondary | ICD-10-CM

## 2010-10-12 MED ORDER — DESONIDE 0.05 % EX OINT: TOPICAL_OINTMENT | Freq: Two times a day (BID) | CUTANEOUS | Status: DC

## 2010-10-12 NOTE — Telephone Encounter (Signed)
Done and sent  

## 2010-10-12 NOTE — Telephone Encounter (Signed)
*   Pt does not want the "Cream", she wants the ointment. Also wants Fluocinolone body oil 0.01%, she used to get from dermatologist but he is no longer in practice.

## 2010-10-12 NOTE — Telephone Encounter (Signed)
Dr Arvilla Market, [facility] came by clinic and is asking for a refill of Fluocinolone body oil 0.01%, she used to get from dermatologist but he is no longer in practice.    Do you want to put in for a dermatology referral so pt can get this oil or would you want to refill?

## 2010-10-15 MED ORDER — DESONIDE 0.05 % EX OINT: TOPICAL_OINTMENT | Freq: Two times a day (BID) | CUTANEOUS | Status: DC

## 2010-10-15 NOTE — Telephone Encounter (Signed)
Hey!  I sent in a script for one tube and also placed a derm referral so she can continue to follow with a dermatologist.  Please let her know the script was sent and we will get her in with a new derm doc.  Thanks!

## 2010-10-16 LAB — WET PREP, GENITAL: Yeast Wet Prep HPF POC: NONE SEEN

## 2010-10-16 LAB — URINALYSIS, ROUTINE W REFLEX MICROSCOPIC
Bilirubin Urine: NEGATIVE
Glucose, UA: NEGATIVE
Hgb urine dipstick: NEGATIVE
Ketones, ur: NEGATIVE
Nitrite: NEGATIVE
Specific Gravity, Urine: 1.01
pH: 6.5

## 2010-10-16 LAB — GC/CHLAMYDIA PROBE AMP, GENITAL
Chlamydia, DNA Probe: NEGATIVE
GC Probe Amp, Genital: NEGATIVE

## 2010-10-19 ENCOUNTER — Telehealth: Payer: Self-pay | Admitting: *Deleted

## 2010-10-19 NOTE — Telephone Encounter (Signed)
I called the pt to tell her the Rx you wrote was sent to pharmacy.  Dr Tonny Branch had already filled the desowen, she also wanted Fluocinolone body oil 0.01%, will you also fill this? [employer].

## 2010-10-20 NOTE — Telephone Encounter (Signed)
Pt informed and will wait for the dermatology referral for the oil

## 2010-10-20 NOTE — Telephone Encounter (Signed)
I don't see the body oil in her current or previous med list.  I'd prefer she just use the cream until she sees a dermatologist; she shouldn't need to use two different formulations for her dermatitis.  Thanks!

## 2010-10-27 ENCOUNTER — Inpatient Hospital Stay (INDEPENDENT_AMBULATORY_CARE_PROVIDER_SITE_OTHER)
Admission: RE | Admit: 2010-10-27 | Discharge: 2010-10-27 | Disposition: A | Discharge status: Home / Self Care | Payer: Self-pay | Source: Ambulatory Visit | Attending: Family Medicine | Admitting: Family Medicine

## 2010-10-27 DIAGNOSIS — K649 Unspecified hemorrhoids: Secondary | ICD-10-CM

## 2010-10-31 ENCOUNTER — Ambulatory Visit (INDEPENDENT_AMBULATORY_CARE_PROVIDER_SITE_OTHER): Payer: Self-pay | Admitting: Internal Medicine

## 2010-10-31 ENCOUNTER — Encounter: Payer: Self-pay | Admitting: Internal Medicine

## 2010-10-31 VITALS — BP 122/88 | HR 72 | Temp 97.7°F | Ht 66.0 in | Wt 178.0 lb

## 2010-10-31 DIAGNOSIS — D126 Benign neoplasm of colon, unspecified: Secondary | ICD-10-CM

## 2010-10-31 DIAGNOSIS — K635 Polyp of colon: Secondary | ICD-10-CM

## 2010-10-31 DIAGNOSIS — K5900 Constipation, unspecified: Secondary | ICD-10-CM

## 2010-10-31 DIAGNOSIS — K5909 Other constipation: Secondary | ICD-10-CM

## 2010-10-31 MED ORDER — DOCUSATE SODIUM 100 MG PO CAPS: 100.0000 mg | ORAL_CAPSULE | Freq: Every day | ORAL | Status: DC | PRN

## 2010-10-31 NOTE — Patient Instructions (Signed)
Please, call with any questions and follow up on as needed basis. Please, follow up with a colonoscopy.

## 2010-10-31 NOTE — Progress Notes (Signed)
  Subjective:    Patient ID: Carrie Franco, female    DOB: [birth year], 45 y.o.   MRN: [id number]  HPI    Review of Systems     Objective:   Physical Exam        Assessment & Plan:

## 2010-10-31 NOTE — Progress Notes (Signed)
Subjective:  Patient ID: Carrie Franco female DOB: [birth year] 45 y.o. MRN: [id number]  HPI: [name] is a 45 y.o. woman is here for a follow up from ED for an external thrombosed hemorrhoid status post thrombectomy. Patient reports feeling better however, still c/o itching. she denies any fever, chills.     Past Medical History  Diagnosis Date  . High risk sexual behavior   . Hypertension   . Bacterial vaginitis     recurrent  . Recurrent UTI   . Genital herpes   . Recurrent boils   . External hemorrhoids with complication   . Allergic rhinitis   . Chronic constipation   . History of cocaine abuse 1992  . History of tobacco abuse 1999  . Bilateral carpal tunnel syndrome   . Acne   . Hyperlipidemia    Current Outpatient Prescriptions  Medication Sig Dispense Refill  . acyclovir (ZOVIRAX) 200 MG capsule Take 1 capsule (200 mg total) by mouth 2 (two) times daily.  30 capsule  4  . albuterol (PROVENTIL,VENTOLIN) 90 MCG/ACT inhaler Inhale 2 puffs into the lungs every 6 (six) hours as needed for Wheezing.  1 each  6  . desonide (DESOWEN) 0.05 % ointment Apply topically 2 (two) times daily. To affected area  60 g  0  . furosemide (LASIX) 20 MG tablet TAKE ONE TABLET BY MOUTH EVERY DAY  90 tablet  2  . metroNIDAZOLE (FLAGYL) 500 MG tablet Take 500 mg by mouth 2 (two) times daily.        . potassium chloride (KLOR-CON) 20 MEQ packet Take 20 mEq by mouth daily.         Family History  Problem Relation Age of Onset  . Cancer Mother   . Diabetes Mother    History   Social History  . Marital Status: Single    Spouse Name: N/A    Number of Children: N/A  . Years of Education: N/A   Social History Main Topics  . Smoking status: Former Smoker    Types: Cigarettes  . Smokeless tobacco: None   Comment: quit 27yrs ago  . Alcohol Use: No  . Drug Use: No  . Sexually Active: None   Other Topics Concern  . None   Social History Narrative    The patient works at a daycare  center, she completed high school, is single    Review of Systems: Constitutional: Denies fever, chills, diaphoresis, appetite change and fatigue.  HEENT: Denies photophobia, eye pain, redness, hearing loss, ear pain, congestion, sore throat, rhinorrhea, sneezing, mouth sores, trouble swallowing, neck pain, neck stiffness and tinnitus.  Respiratory: Denies SOB, DOE, cough, chest tightness, and wheezing.  Cardiovascular: Denies chest pain, palpitations and leg swelling.  Gastrointestinal: Denies nausea, vomiting, abdominal pain, diarrhea, constipation, blood in stool and abdominal distention.  Genitourinary: Denies dysuria, urgency, frequency, hematuria, flank pain and difficulty urinating.  Musculoskeletal: Denies myalgias, back pain, joint swelling, arthralgias and gait problem.  Skin: Denies pallor, rash and wound.  Neurological: Denies dizziness, seizures, syncope, weakness, light-headedness, numbness and headaches.  Hematological: Denies adenopathy. Easy bruising, personal or family bleeding history  Psychiatric/Behavioral: Denies suicidal ideation, mood changes, confusion, nervousness, sleep disturbance and agitation   Vitals: reviewed General: alert, well-developed, and cooperative to examination.  Head: normocephalic and atraumatic.  Eyes: vision grossly intact, pupils equal, pupils round, pupils reactive to light, no injection and anicteric.  Mouth: pharynx pink and moist, no erythema, and no exudates.  Neck: supple, full ROM,  no thyromegaly, no JVD, and no carotid bruits.  Lungs: normal respiratory effort, no accessory muscle use, normal breath sounds, no crackles, and no wheezes. Heart: normal rate, regular rhythm, no murmur, no gallop, and no rub.  Abdomen: soft, non-tender, normal bowel sounds, no distention, no guarding, no rebound tenderness, no hepatomegaly, and no splenomegaly.  Msk: no joint swelling, no joint warmth, and no redness over joints.  Pulses: 2+ DP/PT pulses  bilaterally Extremities: No cyanosis, clubbing, edema Neurologic: alert & oriented X3, cranial nerves II-XII intact, strength normal in all extremities, sensation intact to light touch, and gait normal.  Skin: turgor normal and no rashes.  Psych: Oriented X3, memory intact for recent and remote, normally interactive, good eye contact, not anxious appearing, and not depressed appearing.    Assessment & Plan: 1. External Hemorrhoids sp thrombolectomy in [facility] ED on 10/27/10. -Continue with Sitz bath on PRN basis -continue with annusol PRN -diet high and fiber and fluids - colace PO PRN for constipation.  2. Hx of colon polyps and sp polypectomy in 2009 -refer for a repeat colonoscopy  3. Refused Flu shot.

## 2010-11-06 NOTE — Telephone Encounter (Signed)
Addended by: Neomia Dear on: 11/06/2010 07:07 PM   Modules accepted: Orders

## 2010-11-30 NOTE — Progress Notes (Signed)
Addended by: Neomia Dear on: 11/30/2010 06:29 PM   Modules accepted: Orders

## 2011-01-15 ENCOUNTER — Other Ambulatory Visit: Payer: Self-pay | Admitting: Internal Medicine

## 2011-02-26 ENCOUNTER — Encounter: Payer: Self-pay | Admitting: Internal Medicine

## 2011-03-01 ENCOUNTER — Ambulatory Visit (INDEPENDENT_AMBULATORY_CARE_PROVIDER_SITE_OTHER): Payer: Self-pay | Admitting: Internal Medicine

## 2011-03-01 ENCOUNTER — Encounter: Payer: Self-pay | Admitting: Internal Medicine

## 2011-03-01 VITALS — BP 142/88 | HR 82 | Temp 97.7°F | Wt 178.0 lb

## 2011-03-01 DIAGNOSIS — M549 Dorsalgia, unspecified: Secondary | ICD-10-CM

## 2011-03-01 DIAGNOSIS — M542 Cervicalgia: Secondary | ICD-10-CM

## 2011-03-01 MED ORDER — CYCLOBENZAPRINE HCL 5 MG PO TABS: 5.0000 mg | ORAL_TABLET | Freq: Three times a day (TID) | ORAL | Status: AC | PRN

## 2011-03-01 NOTE — Patient Instructions (Signed)
Follow up with pcp as needed  Back Pain, Adult Back pain is very common. The pain often gets better over time. The cause of back pain is usually not dangerous. Most people can learn to manage their back pain on their own.  HOME CARE   Stay active. Start with short walks on flat ground if you can. Try to walk farther each day.   Do not sit, drive, or stand in one place for more than 30 minutes. Do not stay in bed.   Do not avoid exercise or work. Activity can help your back heal faster.   Be careful when you bend or lift an object. Bend at your knees, keep the object close to you, and do not twist.   Sleep on a firm mattress. Lie on your side, and bend your knees. If you lie on your back, put a pillow under your knees.   Only take medicines as told by your doctor.   Put ice on the injured area.   Put ice in a plastic bag.   Place a towel between your skin and the bag.   Leave the ice on for 15 to 20 minutes, 3 to 4 times a day for the first 2 to 3 days. After that, you can switch between ice and heat packs.   Ask your doctor about back exercises or massage.   Avoid feeling anxious or stressed. Find good ways to deal with stress, such as exercise.  GET HELP RIGHT AWAY IF:   Your pain does not go away with rest or medicine.   Your pain does not go away in 1 week.   You have new problems.   You do not feel well.   The pain spreads into your legs.   You cannot control when you poop (bowel movement) or pee (urinate).   Your arms or legs feel weak or lose feeling (numbness).   You feel sick to your stomach (nauseous) or throw up (vomit).   You have belly (abdominal) pain.   You feel like you may pass out (faint).  MAKE SURE YOU:   Understand these instructions.   Will watch your condition.   Will get help right away if you are not doing well or get worse.  Document Released: 06/20/2007 Document Revised: 09/13/2010 Document Reviewed: 05/22/2010 [facility] Patient  Information 2012 [place], [place].

## 2011-03-01 NOTE — Progress Notes (Signed)
Patient ID: Carrie Franco, female   DOB: December 22, 1965, 46 y.o.   MRN: [id number]  46 y/o q with pmh of htn comes for back pain Going on for 1 week Right sided, under the scapula. Started while she was working. She works as Conservation officer, nature and feels like her working position in uncomfortable and put stress on that side of her body. She had similar complaints in past. She took one flexeril from her brother that helped her pain. She was prescribed flexeril  The last time she had this problems. She denies any radiating pain in her arms, muscles weakness  Or any other symptoms.   Physical exam   General Appearance:     Filed Vitals:   03/01/11 1454  BP: 142/88  Pulse: 82  Temp: 97.7 F (36.5 C)  TempSrc: Oral  Weight: 178 lb (80.74 kg)     Alert, cooperative, no distress, appears stated age  Head:    Normocephalic, without obvious abnormality, atraumatic  Eyes:    PERRL, conjunctiva/corneas clear, EOM's intact, fundi    benign, both eyes       Neck:   Supple, symmetrical, trachea midline, no adenopathy;       thyroid:  No enlargement/tenderness/nodules; no carotid   bruit or JVD  Lungs:     Clear to auscultation bilaterally, respirations unlabored  Chest wall:    No tenderness or deformity  Heart:    Regular rate and rhythm, S1 and S2 normal, no murmur, rub   or gallop  Abdomen:     Soft, non-tender, bowel sounds active all four quadrants,    no masses, no organomegaly  Extremities:   Extremities normal, atraumatic, no cyanosis or edema  Pulses:   2+ and symmetric all extremities  Skin:   Skin color, texture, turgor normal, no rashes or lesions  Neurologic:  nonfocal grossly   ROS  Constitutional: Denies fever, chills, diaphoresis, appetite change and fatigue.  Respiratory: Denies SOB, DOE, cough, chest tightness,  and wheezing.   Cardiovascular: Denies chest pain, palpitations and leg swelling.  Gastrointestinal: Denies nausea, vomiting, abdominal pain, diarrhea, constipation, blood in  stool and abdominal distention.  Skin: Denies pallor, rash and wound.  Neurological: Denies dizziness, light-headedness, numbness and headaches.

## 2011-03-01 NOTE — Assessment & Plan Note (Signed)
Under right scapular region.  Muscle sprain with spasm ibuprofen +flexeril for 1 week. Combine with heating pads.  Follow up in 1 week if not better.

## 2011-03-12 ENCOUNTER — Telehealth: Payer: Self-pay | Admitting: *Deleted

## 2011-03-12 NOTE — Telephone Encounter (Signed)
Pt calls and states she had not had an asthma attack in 5 yrs but Friday she had one and since then has chills, no additional breathing problems, she is using her inhalers as directed, appt thurs 2/28 at 0815, she is instructed to go to [facility] care or ED if she has immediate need of care, she is agreeable. appt per sharonb.

## 2011-03-15 ENCOUNTER — Encounter: Payer: Self-pay | Admitting: Internal Medicine

## 2011-03-16 ENCOUNTER — Encounter: Payer: Self-pay | Admitting: Internal Medicine

## 2011-03-18 ENCOUNTER — Other Ambulatory Visit: Payer: Self-pay

## 2011-03-18 ENCOUNTER — Emergency Department (HOSPITAL_COMMUNITY)
Admission: EM | Admit: 2011-03-18 | Discharge: 2011-03-18 | Disposition: A | Discharge status: Home Health | Payer: Self-pay | Attending: Emergency Medicine | Admitting: Emergency Medicine

## 2011-03-18 ENCOUNTER — Emergency Department (HOSPITAL_COMMUNITY): Payer: Self-pay

## 2011-03-18 ENCOUNTER — Encounter (HOSPITAL_COMMUNITY): Payer: Self-pay | Admitting: *Deleted

## 2011-03-18 DIAGNOSIS — Z79899 Other long term (current) drug therapy: Secondary | ICD-10-CM | POA: Insufficient documentation

## 2011-03-18 DIAGNOSIS — J3489 Other specified disorders of nose and nasal sinuses: Secondary | ICD-10-CM | POA: Insufficient documentation

## 2011-03-18 DIAGNOSIS — R079 Chest pain, unspecified: Secondary | ICD-10-CM | POA: Insufficient documentation

## 2011-03-18 DIAGNOSIS — R0602 Shortness of breath: Secondary | ICD-10-CM | POA: Insufficient documentation

## 2011-03-18 DIAGNOSIS — R070 Pain in throat: Secondary | ICD-10-CM | POA: Insufficient documentation

## 2011-03-18 DIAGNOSIS — E785 Hyperlipidemia, unspecified: Secondary | ICD-10-CM | POA: Insufficient documentation

## 2011-03-18 DIAGNOSIS — R05 Cough: Secondary | ICD-10-CM | POA: Insufficient documentation

## 2011-03-18 DIAGNOSIS — J45909 Unspecified asthma, uncomplicated: Secondary | ICD-10-CM | POA: Insufficient documentation

## 2011-03-18 DIAGNOSIS — R059 Cough, unspecified: Secondary | ICD-10-CM | POA: Insufficient documentation

## 2011-03-18 DIAGNOSIS — I10 Essential (primary) hypertension: Secondary | ICD-10-CM | POA: Insufficient documentation

## 2011-03-18 DIAGNOSIS — J069 Acute upper respiratory infection, unspecified: Secondary | ICD-10-CM

## 2011-03-18 DIAGNOSIS — M549 Dorsalgia, unspecified: Secondary | ICD-10-CM | POA: Insufficient documentation

## 2011-03-18 MED ORDER — PREDNISONE 10 MG PO TABS: ORAL_TABLET | ORAL | Status: DC

## 2011-03-18 MED ORDER — IPRATROPIUM BROMIDE 0.02 % IN SOLN
0.5000 mg | Freq: Once | RESPIRATORY_TRACT | Status: AC
  Administered 2011-03-18: 0.5 mg via RESPIRATORY_TRACT
  Filled 2011-03-18: qty 2.5

## 2011-03-18 MED ORDER — ALBUTEROL SULFATE (5 MG/ML) 0.5% IN NEBU
5.0000 mg | INHALATION_SOLUTION | Freq: Once | RESPIRATORY_TRACT | Status: AC
  Administered 2011-03-18: 5 mg via RESPIRATORY_TRACT
  Filled 2011-03-18: qty 1

## 2011-03-18 MED ORDER — MAGIC MOUTHWASH W/LIDOCAINE: 5.0000 mL | Freq: Four times a day (QID) | ORAL | Status: DC | PRN

## 2011-03-18 NOTE — ED Notes (Signed)
Patient is wanting to leave; patient wanting to know how long it will be until she is discharged.  Family know at bedside.  Will continue to monitor.

## 2011-03-18 NOTE — ED Notes (Signed)
Respiratory called to administer nebulizer treatment; respiratory tech states that she is unable to come at this time (with another patient); RN to administer.

## 2011-03-18 NOTE — ED Notes (Signed)
Patient given discharge paperwork; went over discharge instructions with patient.  Patient instructed to take prednisone and magic mouthwash as directed, to follow up with her primary care physician this week, to drink plenty of fluids/get plenty of rest, and to return to the ED for new, worsening, or concerning symptoms.

## 2011-03-18 NOTE — ED Notes (Signed)
She has had chest pain for 3 days that started off as an asthma attack.  Now she has a sorethroat and she has some difficulty breathing

## 2011-03-18 NOTE — ED Notes (Signed)
Patient complaining of an asthma exacerbation that started four days ago; patient reports back pain, chest tightness (especially on inspiration), shortness of breath, and a sore throat.  Patient rates pain 9/10 on the numerical pain scale (in back and chest).  Patient has a history of asthma since she was a kid.  Upon arrival to room, patient changed into gown.  Patient alert and oriented x4; PERRL present.  Will continue to monitor.

## 2011-03-18 NOTE — ED Provider Notes (Signed)
History     CSN: [id number]  Arrival date & time 03/18/11  2039   First MD Initiated Contact with Patient 03/18/11 2113      Chief Complaint  Patient presents with  . Chest Pain    HPI  History provided by the patient. Patient is a 46 year old African American female with history of hypertension, hyperlipidemia, asthma who presents with complaints of asthma symptoms, sore throat for the past 4-5 days. Symptoms began acutely with asthma attack. Patient states she ran out of her albuterol inhaler but was given a new prescription by PCP. She has had episodic asthma attacks with shortness of breath and wheezing since that time. Patient also reports persistent cough with sore throat symptoms and nasal congestion. Over the past 2-3 days patient reports having associated chest and back pains made worse with coughing and deep breathing. Patient denies any fever, sweats, nausea or vomiting. Patient has been using new albuterol inhaler with some improvement of wheezing symptoms. Patient denies any other aggravating or alleviating factors. Symptoms are described as moderate to severe at times.    Past Medical History  Diagnosis Date  . High risk sexual behavior   . Hypertension   . Bacterial vaginitis     recurrent  . Recurrent UTI   . Genital herpes   . Recurrent boils   . External hemorrhoids with complication   . Allergic rhinitis   . Chronic constipation   . History of cocaine abuse 1992  . History of tobacco abuse 1999  . Bilateral carpal tunnel syndrome   . Acne   . Hyperlipidemia   . Asthma     History reviewed. No pertinent past surgical history.  Family History  Problem Relation Age of Onset  . Cancer Mother   . Diabetes Mother     History  Substance Use Topics  . Smoking status: Former Smoker    Types: Cigarettes  . Smokeless tobacco: Not on file   Comment: quit 70yrs ago  . Alcohol Use: No    OB History    Grav Para Term Preterm Abortions TAB SAB Ect Mult  Living                  Review of Systems  Constitutional: Positive for chills. Negative for fever.  HENT: Positive for congestion, sore throat and voice change. Negative for rhinorrhea.   Respiratory: Positive for cough, shortness of breath and wheezing.   Cardiovascular: Positive for chest pain.  Gastrointestinal: Negative for nausea, vomiting, abdominal pain, diarrhea and constipation.  Musculoskeletal: Positive for back pain.  All other systems reviewed and are negative.    Allergies  Epinephrine and Penicillins  Home Medications   Current Outpatient Rx  Name Route Sig Dispense Refill  . DESONIDE 0.05 % EX OINT Topical Apply topically 2 (two) times daily. To affected area 60 g 0  . DOCUSATE SODIUM 100 MG PO CAPS Oral Take 1 capsule (100 mg total) by mouth daily as needed for constipation. 30 capsule 3  . FUROSEMIDE 20 MG PO TABS Oral Take 20 mg by mouth 2 (two) times daily.    Marland Kitchen POTASSIUM CHLORIDE 20 MEQ PO PACK Oral Take 20 mEq by mouth daily.        BP 122/81  Pulse 79  Temp(Src) 98.3 F (36.8 C) (Oral)  Resp 18  SpO2 100%  Physical Exam  Nursing note and vitals reviewed. Constitutional: She is oriented to person, place, and time. She appears well-developed and well-nourished. No distress.  HENT:  Head: Normocephalic and atraumatic.  Right Ear: External ear normal.  Left Ear: Tympanic membrane and external ear normal.       Hoarse voice.  Neck: Normal range of motion. Neck supple. No tracheal deviation present.  Cardiovascular: Normal rate and regular rhythm.   Pulmonary/Chest: Effort normal. No stridor. No respiratory distress. She has wheezes. She has no rales. She exhibits tenderness.  Abdominal: Soft. She exhibits no distension. There is no tenderness. There is no rebound.  Lymphadenopathy:    She has no cervical adenopathy.  Neurological: She is alert and oriented to person, place, and time.  Skin: Skin is warm and dry. No rash noted.  Psychiatric: She  has a normal mood and affect. Her behavior is normal.    ED Course  Procedures   Dg Chest 2 View  03/18/2011  *RADIOLOGY REPORT*  Clinical Data: Asthma attack, chest pain, sore throat  CHEST - 2 VIEW  Comparison: None  Findings: Normal heart size, mediastinal contours, and pulmonary vascularity. Mild peribronchial thickening. Minimal hyperaeration. No pulmonary infiltrate, pleural effusion, or pneumothorax. Bones unremarkable.  IMPRESSION: Mild peribronchial thickening, which could be related to bronchitis or asthma. No acute infiltrate.  Original Report Authenticated By: Lollie Marrow, M.D.     1. Asthma   2. URI (upper respiratory infection)       MDM  9:10 PM patient seen and evaluated. Patient in no acute distress.   Patient feeling much better after treatments. X-rays unremarkable. Patient with stable normal vital signs and afebrile. This time plan to discharge home albuterol inhaler. Patient was discussed with attending physician and he agrees with plan.     Angus Seller, [place] 03/19/11 [phone]

## 2011-03-19 NOTE — ED Provider Notes (Signed)
Medical screening examination/treatment/procedure(s) were performed by non-physician practitioner and as supervising physician I was immediately available for consultation/collaboration.   Glynn Octave, MD 03/19/11 1105

## 2011-03-30 ENCOUNTER — Ambulatory Visit (INDEPENDENT_AMBULATORY_CARE_PROVIDER_SITE_OTHER): Payer: Self-pay | Admitting: Internal Medicine

## 2011-03-30 ENCOUNTER — Encounter: Payer: Self-pay | Admitting: Internal Medicine

## 2011-03-30 VITALS — BP 134/88 | HR 106 | Temp 97.1°F | Ht 66.0 in | Wt 179.8 lb

## 2011-03-30 DIAGNOSIS — K5900 Constipation, unspecified: Secondary | ICD-10-CM

## 2011-03-30 DIAGNOSIS — N898 Other specified noninflammatory disorders of vagina: Secondary | ICD-10-CM

## 2011-03-30 DIAGNOSIS — K5909 Other constipation: Secondary | ICD-10-CM

## 2011-03-30 DIAGNOSIS — M545 Low back pain, unspecified: Secondary | ICD-10-CM

## 2011-03-30 MED ORDER — DOCUSATE SODIUM 100 MG PO CAPS: 100.0000 mg | ORAL_CAPSULE | Freq: Every day | ORAL | Status: DC | PRN

## 2011-03-30 MED ORDER — LORATADINE 10 MG PO CAPS: 1.0000 | ORAL_CAPSULE | Freq: Every day | ORAL | Status: DC

## 2011-03-30 MED ORDER — ALBUTEROL SULFATE HFA 108 (90 BASE) MCG/ACT IN AERS: 2.0000 | INHALATION_SPRAY | Freq: Four times a day (QID) | RESPIRATORY_TRACT | Status: DC | PRN

## 2011-03-30 MED ORDER — FLUCONAZOLE 150 MG PO TABS: 150.0000 mg | ORAL_TABLET | Freq: Once | ORAL | Status: AC

## 2011-03-30 MED ORDER — MELOXICAM 7.5 MG PO TABS: 7.5000 mg | ORAL_TABLET | Freq: Every day | ORAL | Status: DC

## 2011-03-30 NOTE — Progress Notes (Signed)
Subjective:     Patient ID: Carrie Franco, female   DOB: March 01, 1965, 46 y.o.   MRN: [id number]  HPI  Pt here for f/u on ER visit where she was seen for asthma exacerbation  1. Asthma exacerbation: pt has completed her course of prednisone with resolution of all of her symptoms.  2. Back pain: pt reports pain not relieved with flexeril.  Patient has had a good response previously to the "blue pill" of flexeril.  She took one pill at night for three days and then stopped because she did not appreciate significant improvement of her symptms. She describes her pain as a "muscle pain."  She denies fever, chills,   3. Vaginal discharge: she describes the discharge as white.  She denies a very strong odor.  She reports mild vaginal itching.  She has been with the same partner for 34mo.  Dens dysuria, hematuria, or hesitancy.  Admits so mild increased frequency.  She denies any abnormal vaginal bleeding, vaginal lesions, or pelvic pain.   Review of Systems  Constitutional: Negative for fever, chills, diaphoresis, activity change, appetite change, fatigue and unexpected weight change.  HENT: Negative for hearing loss, congestion and neck stiffness.   Eyes: Negative for photophobia, pain and visual disturbance.  Respiratory: Negative for cough, chest tightness, shortness of breath and wheezing.   Cardiovascular: Negative for chest pain and palpitations.  Gastrointestinal: Negative for abdominal pain, blood in stool and anal bleeding.  Genitourinary: Negative for dysuria, hematuria and difficulty urinating.  Musculoskeletal: Negative for joint swelling.  Neurological: Negative for dizziness, syncope, speech difficulty, weakness, numbness and headaches.      Objective:   Physical Exam Vitals reviewed GEN: No apparent distress.  Alert and oriented x 3.  Pleasant, conversant, and cooperative to exam. HEENT: head is autraumatic and normocephalic.  Neck is supple without palpable masses or  lymphadenopathy.  No JVD or carotid bruits.  Vision intact.  EOMI.  PERRLA.  Sclerae anicteric.  Conjunctivae without pallor or injection. Mucous membranes are moist.  Oropharynx is without erythema, exudates, or other abnormal lesions.  Dentition is poor with numerous teeth missing. RESP:  Lungs are clear to ascultation bilaterally with good air movement.  No wheezes, ronchi, or rubs. CARDIOVASCULAR: regular rate, normal rhythm.  Clear S1, S2, no murmurs, gallops, or rubs. ABDOMEN: soft, non-tender, non-distended.  Bowels sounds present in all quadrants and normoactive.  No palpable masses. EXT: warm and dry.  Peripheral pulses equal, intact, and +2 globally.  No clubbing or cyanosis.  Trace edema in bilateral lower extremities. SKIN: warm and dry with normal turgor.  No rashes or abnormal lesions observed. NEURO: CN II-XII grossly intact.  Muscle strength +5/5 in bilateral upper and lower extremities.  Sensation is grossly intact.  No focal deficit.     Assessment/Plan:

## 2011-03-30 NOTE — Patient Instructions (Signed)
Schedule a follow up appointment with Dr. Arvilla Market in May for a routine checkup, sooner if needed. Try over-the-counter saline nose spray for relief of your congestion. Mobic is a long acting pain medicine.  Take one pill every day with food for relief of pain. Start taking your Flexeril three times a day; take two tablets at night, one tablet in the morning, and one tablet at lunch. Start taking loratadine every day.  This will also help your congestion and allergy symptoms. Fluconazole is a medicine for yeast infection.  Take one pill as directed.  If your symptoms continue beyond 2 days take a second pill. Your medicines were electronically sent to your [facility] pharmacy. I will call you if the results of your lab work are abnormal. Have a good weekend!

## 2011-03-31 LAB — URINALYSIS, ROUTINE W REFLEX MICROSCOPIC
Bilirubin Urine: NEGATIVE
Glucose, UA: NEGATIVE mg/dL
Hgb urine dipstick: NEGATIVE
Ketones, ur: NEGATIVE mg/dL
Nitrite: NEGATIVE
Specific Gravity, Urine: 1.021 (ref 1.005–1.030)
Urobilinogen, UA: 1 mg/dL (ref 0.0–1.0)

## 2011-03-31 LAB — URINALYSIS, MICROSCOPIC ONLY
Casts: NONE SEEN
Crystals: NONE SEEN

## 2011-04-02 DIAGNOSIS — M545 Low back pain, unspecified: Secondary | ICD-10-CM | POA: Insufficient documentation

## 2011-04-02 DIAGNOSIS — K5909 Other constipation: Secondary | ICD-10-CM | POA: Insufficient documentation

## 2011-04-02 DIAGNOSIS — N898 Other specified noninflammatory disorders of vagina: Secondary | ICD-10-CM | POA: Insufficient documentation

## 2011-04-02 HISTORY — DX: Other specified noninflammatory disorders of vagina: N89.8

## 2011-04-02 NOTE — Assessment & Plan Note (Signed)
Patient's symptoms are most consistent with vaginal candidiasis.  Will treat empirically with a one-time dose of fluconazole. Will also obtain urine and sent for gonorrhea and chlamydia. Patient is advised to return to the clinic if her symptoms are not resolved after treatment with fluconazole or she develops pain, worsening vaginal discharge, abnormal vaginal bleeding, or vaginal lesions.

## 2011-04-02 NOTE — Assessment & Plan Note (Signed)
Patient's symptoms of low back pain are consistent with a musculoskeletal strain. She has not had any "red flags" to suggest cauda equina syndrome, epidural abscess, or other concerning etiology. She has not been using Flexeril appropriately; advised her to try taking 2 tablets at night before bed and then one tablet in the morning and one in the afternoon as 3 times a day dosing is required to achieve therapeutic levels. Will also prescribe her Mobic to be used for additional relief of pain. She is advised to return to clinic if she develops fever, chills, radicular pain, tingling, weakness, numbness, bowel/urinary incontinence or bowel/urinary retention, or if her symptoms are not resolved in 2 weeks.

## 2011-04-02 NOTE — Assessment & Plan Note (Signed)
Patient requests refill of Colace for symptomatic relief of constipation. Will provide a refill today. She denies bright red blood per, dark tarry stools, abdominal pain, nausea, vomiting, or diarrhea.

## 2011-04-06 ENCOUNTER — Other Ambulatory Visit: Payer: Self-pay | Admitting: Internal Medicine

## 2011-04-06 ENCOUNTER — Other Ambulatory Visit: Payer: Self-pay | Admitting: *Deleted

## 2011-04-06 DIAGNOSIS — K5909 Other constipation: Secondary | ICD-10-CM

## 2011-04-06 MED ORDER — DOCUSATE SODIUM 100 MG PO CAPS: 100.0000 mg | ORAL_CAPSULE | Freq: Every day | ORAL | Status: DC | PRN

## 2011-04-06 NOTE — Telephone Encounter (Signed)
Rx phoned into pharmacy.

## 2011-04-09 ENCOUNTER — Ambulatory Visit (INDEPENDENT_AMBULATORY_CARE_PROVIDER_SITE_OTHER): Payer: No Typology Code available for payment source | Admitting: Internal Medicine

## 2011-04-09 ENCOUNTER — Encounter: Payer: Self-pay | Admitting: Internal Medicine

## 2011-04-09 VITALS — BP 126/81 | HR 96 | Temp 97.0°F | Ht 65.0 in | Wt 182.4 lb

## 2011-04-09 DIAGNOSIS — M79609 Pain in unspecified limb: Secondary | ICD-10-CM

## 2011-04-09 DIAGNOSIS — K5909 Other constipation: Secondary | ICD-10-CM

## 2011-04-09 DIAGNOSIS — M79646 Pain in unspecified finger(s): Secondary | ICD-10-CM

## 2011-04-09 DIAGNOSIS — M654 Radial styloid tenosynovitis [de Quervain]: Secondary | ICD-10-CM

## 2011-04-09 DIAGNOSIS — L259 Unspecified contact dermatitis, unspecified cause: Secondary | ICD-10-CM

## 2011-04-09 DIAGNOSIS — M25569 Pain in unspecified knee: Secondary | ICD-10-CM

## 2011-04-09 DIAGNOSIS — I10 Essential (primary) hypertension: Secondary | ICD-10-CM

## 2011-04-09 DIAGNOSIS — L309 Dermatitis, unspecified: Secondary | ICD-10-CM

## 2011-04-09 DIAGNOSIS — M7989 Other specified soft tissue disorders: Secondary | ICD-10-CM

## 2011-04-09 HISTORY — DX: Radial styloid tenosynovitis (de quervain): M65.4

## 2011-04-09 LAB — COMPREHENSIVE METABOLIC PANEL
ALT: 13 U/L (ref 0–35)
AST: 25 U/L (ref 0–37)
Chloride: 106 mEq/L (ref 96–112)
Creat: 0.75 mg/dL (ref 0.50–1.10)
Sodium: 140 mEq/L (ref 135–145)
Total Bilirubin: 0.3 mg/dL (ref 0.3–1.2)
Total Protein: 6.9 g/dL (ref 6.0–8.3)

## 2011-04-09 MED ORDER — MELOXICAM 7.5 MG PO TABS: 7.5000 mg | ORAL_TABLET | Freq: Every day | ORAL | Status: DC

## 2011-04-09 MED ORDER — DOCUSATE SODIUM 100 MG PO CAPS: 100.0000 mg | ORAL_CAPSULE | Freq: Every day | ORAL | Status: DC | PRN

## 2011-04-09 NOTE — Assessment & Plan Note (Addendum)
As per patient request referred to Dermatology.

## 2011-04-09 NOTE — Patient Instructions (Addendum)
1. If the pain is not completely controlled you can take Tylenol 325 mg every 6 hours as needed 2. Please get a splint which can help with your thumb.  3.   Avoid for 2 weeks any kind of straining  exercise including sqats . After that start gradually  preferable swimming. If your symptoms does not improve significantly please you need to reevaluted.

## 2011-04-09 NOTE — Progress Notes (Signed)
Subjective:   Patient ID: Carrie Franco female   DOB: [birth year] 46 y.o.   MRN: [id number]  HPI: Carrie Franco is a 46 y.o. female with past medical history significant as outlined below who presented to the clinic with right thumb pain and left knee pain.  1. knee pain: Patient reports a month ago she was doing squats which she has been using on a regular basis and since then she has been experiencing left knee pain especially when she bends it. She further noted  mild swelling but no redness. She denies any giving out feeling, numbness or tingling of her leg. Denies any trauma. 2. right thumb: She has been experiencing since 2 weeks swelling and pain in the right thumb. Denies any locking.  Denies any trauma but uses her thumb to use her phone on a regular basis: She sent a lot of messages. She is not able to pick up or grab things without pain.  Patient noted that she has been taking 6 aleves for the last 3 days no significant improvement. Patient continues to take Flexeril which she was prescribed on March 15.     Past Medical History  Diagnosis Date  . High risk sexual behavior   . Hypertension   . Bacterial vaginitis     recurrent  . Recurrent UTI   . Genital herpes   . Recurrent boils   . External hemorrhoids with complication   . Allergic rhinitis   . Chronic constipation   . History of cocaine abuse 1992  . History of tobacco abuse 1999  . Bilateral carpal tunnel syndrome   . Acne   . Hyperlipidemia   . Asthma    Current Outpatient Prescriptions  Medication Sig Dispense Refill  . albuterol (PROVENTIL HFA;VENTOLIN HFA) 108 (90 BASE) MCG/ACT inhaler Inhale 2 puffs into the lungs every 6 (six) hours as needed for wheezing.  1 Inhaler  11  . Alum & Mag Hydroxide-Simeth (MAGIC MOUTHWASH W/LIDOCAINE) SOLN Take 5 mLs by mouth 4 (four) times daily as needed.  100 mL  0  . desonide (DESOWEN) 0.05 % ointment Apply topically 2 (two) times daily. To affected area  60 g  0  .  docusate sodium (COLACE) 100 MG capsule Take 1 capsule (100 mg total) by mouth daily as needed for constipation.  30 capsule  11  . furosemide (LASIX) 20 MG tablet Take 20 mg by mouth 2 (two) times daily.      . Loratadine 10 MG CAPS Take 1 capsule (10 mg total) by mouth daily.  30 each  6  . meloxicam (MOBIC) 7.5 MG tablet Take 1 tablet (7.5 mg total) by mouth daily. Take with food  30 tablet  0  . potassium chloride (KLOR-CON) 20 MEQ packet Take 20 mEq by mouth daily.        . predniSONE (DELTASONE) 10 MG tablet Take 6 tablets on day one, take 5 tablets on day 2, take 4 tablets on day 3, take 3 tablets on day 4, take 2 tabs on day 5, take 1 tablet a 6  21 tablet  0   Family History  Problem Relation Age of Onset  . Cancer Mother   . Diabetes Mother    History   Social History  . Marital Status: Single    Spouse Name: N/A    Number of Children: N/A  . Years of Education: N/A   Social History Main Topics  . Smoking status: Former Smoker  Types: Cigarettes  . Smokeless tobacco: None   Comment: quit 57yrs ago  . Alcohol Use: No  . Drug Use: No  . Sexually Active: None   Other Topics Concern  . None   Social History Narrative    The patient works at a daycare center, she completed high school, is single   Review of Systems: Constitutional: Denies fever, chills, diaphoresis, appetite change and fatigue.  Respiratory: Denies SOB, DOE, cough, chest tightness,  and wheezing.   Cardiovascular: Denies chest pain, palpitations and leg swelling.  Skin: Denies pallor, rash and wound.   Objective:  Physical Exam: Filed Vitals:   04/09/11 1555  BP: 126/81  Pulse: 96  Temp: 97 F (36.1 C)  TempSrc: Oral  Height: 5\' 5"  (1.651 m)  Weight: 182 lb 6.4 oz (82.736 kg)  SpO2: 100%   Constitutional: Vital signs reviewed.  Patient is a well-developed and well-nourished  in no acute distress and cooperative with exam. Alert and oriented x3.  Cardiovascular: RRR, S1 normal, S2 normal,    Pulmonary/Chest: CTAB, no wheezes, rales, or rhonchi Abdominal: Soft. Non-tender, non-distended, bowel sounds are normal Musculoskeletal: Right thumb: Swelling as well as tenderness to palpation along the tenden as well as in the metacarpal and MCP joint area. Positive Finkelstein test. No joint deformities, erythema, or stiffness, ROM decreased due to pain.  Left Knee: mild swelling noted  No joint deformities, erythema, or stiffness, ROM WNL. Mild crepitus noted. Pain aggravated with abduction and adduction as well as flexion and extension.  Neurological: A&O x3, Strenght is normal and symmetric bilaterally, sensory intact to light touch bilaterally.  Skin: Warm, dry and intact. No rash, cyanosis, or clubbing.

## 2011-04-09 NOTE — Assessment & Plan Note (Signed)
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 Positive Finkelstein test. Advised to use a splint and started the patient on meloxicam 7.5 mg daily which she can increase to twice a day if needed. Recommended if her pain has not improved significantly to be reevaluated in the clinic. Unlikely fracture with no  history of trauma or fall as well unlikely arthritis due to acute onset.

## 2011-04-09 NOTE — Assessment & Plan Note (Signed)
Likely mild arthritis. Other DD include meniscal or ligament injury after exercise. Prescribed Mobic 7.5 mg daily, if needed twice a day. Recommended for further pain management Tylenol. Furthermore advised to use cold compresses on the knee and hold off for 2 weeks for any kind of straining  Exercise including sqats . After that start gradually  preferable swimming. Advised the patient if her symptoms does not improve significantly she needs to be reevaluated. May consider at that time imaging studies.

## 2011-04-23 ENCOUNTER — Other Ambulatory Visit: Payer: Self-pay | Admitting: Internal Medicine

## 2011-05-22 ENCOUNTER — Other Ambulatory Visit: Payer: Self-pay | Admitting: *Deleted

## 2011-05-22 DIAGNOSIS — I10 Essential (primary) hypertension: Secondary | ICD-10-CM

## 2011-05-22 NOTE — Telephone Encounter (Signed)
Has appt  05/24/11 and will discuss blood sugars with doctor. Stanton Kidney Marguerita Stapp RN 05/22/11 4:10PM

## 2011-05-23 NOTE — Telephone Encounter (Signed)
Patient does not have dx of hypokalemia and is not on any current medication per chart revie that would cause low potassium. Additionally her last metabolic panel revealed a potassium of 3.7. As she has an upcoming appointment or not, we'll not refill her potassium pills at this time and defer this discussion to her office visit

## 2011-05-23 NOTE — Telephone Encounter (Signed)
Pt has appt sch 05/24/11.

## 2011-05-23 NOTE — Telephone Encounter (Signed)
[facility] pharmacy made awared of denial. 

## 2011-05-24 ENCOUNTER — Encounter: Payer: Self-pay | Admitting: Internal Medicine

## 2011-05-25 ENCOUNTER — Encounter: Payer: Self-pay | Admitting: Internal Medicine

## 2011-05-29 ENCOUNTER — Ambulatory Visit (HOSPITAL_COMMUNITY)
Admission: RE | Admit: 2011-05-29 | Discharge: 2011-05-29 | Disposition: A | Discharge status: Home / Self Care | Payer: Self-pay | Source: Ambulatory Visit | Attending: Internal Medicine | Admitting: Internal Medicine

## 2011-05-29 ENCOUNTER — Ambulatory Visit (INDEPENDENT_AMBULATORY_CARE_PROVIDER_SITE_OTHER): Payer: Self-pay | Admitting: Internal Medicine

## 2011-05-29 VITALS — BP 119/82 | HR 90 | Temp 97.0°F | Ht 66.0 in | Wt 182.7 lb

## 2011-05-29 DIAGNOSIS — M79646 Pain in unspecified finger(s): Secondary | ICD-10-CM

## 2011-05-29 DIAGNOSIS — R5383 Other fatigue: Secondary | ICD-10-CM | POA: Insufficient documentation

## 2011-05-29 DIAGNOSIS — M79609 Pain in unspecified limb: Secondary | ICD-10-CM | POA: Insufficient documentation

## 2011-05-29 DIAGNOSIS — Z79899 Other long term (current) drug therapy: Secondary | ICD-10-CM

## 2011-05-29 DIAGNOSIS — M25569 Pain in unspecified knee: Secondary | ICD-10-CM

## 2011-05-29 DIAGNOSIS — R42 Dizziness and giddiness: Secondary | ICD-10-CM

## 2011-05-29 DIAGNOSIS — R110 Nausea: Secondary | ICD-10-CM

## 2011-05-29 LAB — COMPREHENSIVE METABOLIC PANEL
CO2: 24 mEq/L (ref 19–32)
Creat: 0.81 mg/dL (ref 0.50–1.10)
Glucose, Bld: 90 mg/dL (ref 70–99)
Total Bilirubin: 0.4 mg/dL (ref 0.3–1.2)

## 2011-05-29 MED ORDER — MELOXICAM 7.5 MG PO TABS: 7.5000 mg | ORAL_TABLET | Freq: Every day | ORAL | Status: DC

## 2011-05-29 NOTE — Patient Instructions (Signed)
Schedule a follow up appointment in 1-56months, sooner if needed. We will get an x-ray of your right hand and refer you to sports medicine for further evaluation and treatment of your right thumb pain. I will call you if the any of the results from your blood work are abnormal. Mobic is a medicine for pain. Use as directed. Take with food. Avoid any ibuprofen, Motrin, Aleve, Naprosyn, naproxen, and all other nonsteroidal anti-inflammatory drugs while you take Mobic. You can use over-the-counter acetaminophen (Tylenol) if needed for additional pain relief. Start taking over-the-counter omeprazole (prilosec) to treat any underlying reflux that may be contributing to your nausea. Keep taking your medications as directed. Call the clinic at [phone] with any concerns or questions.

## 2011-05-29 NOTE — Assessment & Plan Note (Signed)
I believe her thumb pain is the result of tendinitis 2/2 overuse.  Will obtain xray today to r/o fracture.  Will refer pt to sports medicine for further evaluation and treatment; she may benefit from targeted injection therapy.  Will refill Mobic for pain relief.  Patient is advised to take Mobic with food and to avoid all other nonsteroidal anti-inflammatory agents will take Mobic.

## 2011-05-29 NOTE — Assessment & Plan Note (Signed)
Am unsure of the etiology behind patient's fatigue, nausea, and intermittent lightheadedness.  She denies feeling depressed but notes increased stress over her son's upcoming graduation from high school and plans cor military vs college.  It is possible she may be experiencing a mild viral illness. She should not be hypokalemic if she is not taking lasix.  Hypoglycemia also seems unlikely; I do not think she has diabetes as review of prior labs does not reveal any hyperglycemia.  Will check a hemoglobin A1c today, TSH, and comprehensive metabolic panel for further workup.

## 2011-05-29 NOTE — Progress Notes (Signed)
Patient ID: Carrie Franco, female   DOB: 23-Sep-1965, 46 y.o.   MRN: [id number] Subjective:     HPI: Patient is a 46 y/o F who presents today with complaint of right thumb pain as well as nausea, fatigue, and dizziness.  Right thumb pain: patient reports persistent pain in her right thumb present for > 27month.  She has difficulty holding onto things using her right hand as a result of this pain.  She denies trauma/  She is right handed and frequently texts using her thumbs to type.  She reports the pain was alleviated with mobic, but patient is now out of this medication.  She states the thumb pain is worse with use of splints prescribed at her last OV.    Fatigue, nausea, dizziness: patient reports 2 weeks of these symptoms.  She describes her dizziness as an intermittent sensation of feeling lightheaded; denies syncope.  She reports nausea that improves after eating; denies emesis, abdominal pain, diarrhea, BRBPR, or dark, tarry stool.  She denies any recent changes to her medicine but notes she has not been taking KCl/Lasix and thinks her symptoms may be the result of low potassium.  She also is concerned about the possibility of DM and low blood sugars; she denies polydipsia and polyuria.   Review of Systems Constitutional: Negative for fever, chills, diaphoresis, activity change, appetite change, fatigue and unexpected weight change.  HENT: Negative for hearing loss, congestion and neck stiffness.   Eyes: Negative for photophobia, pain and visual disturbance.  Respiratory: Negative for cough, chest tightness, shortness of breath and wheezing.   Cardiovascular: Negative for chest pain and palpitations.  Gastrointestinal: Negative for abdominal pain, blood in stool and anal bleeding.  Genitourinary: Negative for dysuria, hematuria and difficulty urinating.  Musculoskeletal: Negative for joint swelling.  Neurological: Negative for syncope, speech difficulty, weakness, numbness and headaches.     Objective:   Physical Exam VItal signs reviewed and stable. GEN: No apparent distress.  Alert and oriented x 3.  Pleasant, conversant, and cooperative to exam. HEENT: head is autraumatic and normocephalic.  Neck is supple without palpable masses or lymphadenopathy.  No JVD or carotid bruits.  Vision intact.  EOMI.  PERRLA.  Sclerae anicteric.  Conjunctivae without pallor or injection. Mucous membranes are moist.  Oropharynx is without erythema, exudates, or other abnormal lesions.   RESP:  Lungs are clear to ascultation bilaterally with good air movement.  No wheezes, ronchi, or rubs. CARDIOVASCULAR: regular rate, normal rhythm.  Clear S1, S2, no murmurs, gallops, or rubs. ABDOMEN: soft, non-tender, non-distended.  Bowels sounds present in all quadrants and normoactive.  No palpable masses. EXT: warm and dry.  Peripheral pulses equal, intact, and +2 globally.  No clubbing or cyanosis.  No edema in bilateral lower extremities.  TTP of posterior thumb extending from wrist to DIP.  Finger strength and ROM fully intact; grip slightly diminished as a result of pain. SKIN: warm and dry with normal turgor.  No rashes or abnormal lesions observed. NEURO: CN II-XII grossly intact.  Muscle strength +5/5 in bilateral upper and lower extremities.  Sensation is grossly intact.  No focal deficit.     Assessment/Plan:

## 2011-05-30 ENCOUNTER — Encounter: Payer: Self-pay | Admitting: Internal Medicine

## 2011-06-04 ENCOUNTER — Emergency Department (HOSPITAL_COMMUNITY)
Admission: EM | Admit: 2011-06-04 | Discharge: 2011-06-04 | Disposition: A | Discharge status: Home / Self Care | Payer: Self-pay | Source: Home / Self Care | Attending: Emergency Medicine | Admitting: Emergency Medicine

## 2011-06-04 ENCOUNTER — Encounter (HOSPITAL_COMMUNITY): Payer: Self-pay | Admitting: Cardiology

## 2011-06-04 DIAGNOSIS — B9689 Other specified bacterial agents as the cause of diseases classified elsewhere: Secondary | ICD-10-CM

## 2011-06-04 DIAGNOSIS — A499 Bacterial infection, unspecified: Secondary | ICD-10-CM

## 2011-06-04 DIAGNOSIS — N760 Acute vaginitis: Secondary | ICD-10-CM

## 2011-06-04 LAB — WET PREP, GENITAL: Yeast Wet Prep HPF POC: NONE SEEN

## 2011-06-04 LAB — POCT URINALYSIS DIP (DEVICE)
Bilirubin Urine: NEGATIVE
Glucose, UA: NEGATIVE mg/dL
Hgb urine dipstick: NEGATIVE
Ketones, ur: NEGATIVE mg/dL
Nitrite: NEGATIVE

## 2011-06-04 MED ORDER — METRONIDAZOLE 500 MG PO TABS: 500.0000 mg | ORAL_TABLET | Freq: Two times a day (BID) | ORAL | Status: AC

## 2011-06-04 NOTE — Discharge Instructions (Signed)
We will contact you if abnormal test results will require further treatment.    Bacterial Vaginosis Bacterial vaginosis is an infection of the vagina. A healthy vagina has many kinds of good germs (bacteria). Sometimes the number of good germs can change. This allows bad germs to move in and cause an infection. You may be given medicine (antibiotics) to treat the infection. Or, you may not need treatment at all. HOME CARE  Take your medicine as told. Finish them even if you start to feel better.   Do not have sex until you finish your medicine.   Do not douche.   Practice safe sex.   Tell your sex partner that you have an infection. They should see their doctor for treatment if they have problems.  GET HELP RIGHT AWAY IF:  You do not get better after 3 days of treatment.   You have grey fluid (discharge) coming from your vagina.   You have pain.   You have a temperature of 102 F (38.9 C) or higher.  MAKE SURE YOU:   Understand these instructions.   Will watch your condition.   Will get help right away if you are not doing well or get worse.  Document Released: 10/11/2007 Document Revised: 12/21/2010 Document Reviewed: 10/11/2007 [facility] Patient Information 2012 [place], [place].

## 2011-06-04 NOTE — ED Provider Notes (Signed)
History     CSN: [id number]  Arrival date & time 06/04/11  1153   First MD Initiated Contact with Patient 06/04/11 1347      Chief Complaint  Patient presents with  . Vaginal Discharge  . Urinary Frequency    (Consider location/radiation/quality/duration/timing/severity/associated sxs/prior treatment) HPI Comments: Patient with vaginal discharge for 2 weeks and urinating very frequently for about 2 weeks. She says the discharge is abundant thick and does not itch so far. Patient denies any pelvic pain, fevers, nausea vomiting or diarrhea as. Has not tried any over-the-counter medicines.  Patient is a 46 y.o. female presenting with vaginal discharge and frequency. The history is provided by the patient.  Vaginal Discharge This is a new problem. The current episode started more than 1 week ago. The problem occurs constantly. The problem has been gradually worsening. Pertinent negatives include no chest pain, no abdominal pain, no headaches and no shortness of breath. The symptoms are relieved by nothing. She has tried nothing for the symptoms.  Urinary Frequency Pertinent negatives include no chest pain, no abdominal pain, no headaches and no shortness of breath.    Past Medical History  Diagnosis Date  . High risk sexual behavior   . Hypertension   . Bacterial vaginitis     recurrent  . Recurrent UTI   . Genital herpes   . Recurrent boils   . External hemorrhoids with complication   . Allergic rhinitis   . Chronic constipation   . History of cocaine abuse 1992  . History of tobacco abuse 1999  . Bilateral carpal tunnel syndrome   . Acne   . Hyperlipidemia   . Asthma     Past Surgical History  Procedure Date  . Tuumy tuck 03/2010    Family History  Problem Relation Age of Onset  . Cancer Mother   . Diabetes Mother     History  Substance Use Topics  . Smoking status: Former Smoker    Types: Cigarettes  . Smokeless tobacco: Not on file   Comment: quit 58yrs ago   . Alcohol Use: No    OB History    Grav Para Term Preterm Abortions TAB SAB Ect Mult Living                  Review of Systems  Constitutional: Negative for fever, activity change, appetite change and fatigue.  Respiratory: Negative for shortness of breath.   Cardiovascular: Negative for chest pain.  Gastrointestinal: Negative for abdominal pain.  Genitourinary: Positive for frequency and vaginal discharge. Negative for dysuria, urgency and pelvic pain.  Neurological: Negative for headaches.    Allergies  Penicillins  Home Medications   Current Outpatient Rx  Name Route Sig Dispense Refill  . ALBUTEROL SULFATE HFA 108 (90 BASE) MCG/ACT IN AERS Inhalation Inhale 2 puffs into the lungs every 6 (six) hours as needed for wheezing. 1 Inhaler 11  . DESONIDE 0.05 % EX OINT Topical Apply topically 2 (two) times daily. To affected area 60 g 0  . DOCUSATE SODIUM 100 MG PO CAPS Oral Take 1 capsule (100 mg total) by mouth daily as needed for constipation. 30 capsule 11  . FUROSEMIDE 20 MG PO TABS Oral Take 20 mg by mouth 2 (two) times daily.    . FUROSEMIDE 20 MG PO TABS  TAKE ONE TABLET BY MOUTH EVERY DAY 60 tablet 0  . LORATADINE 10 MG PO CAPS Oral Take 1 capsule (10 mg total) by mouth daily. 30 each 6  .  MELOXICAM 7.5 MG PO TABS Oral Take 1 tablet (7.5 mg total) by mouth daily. 60 tablet 0    You can take up to 2 tablets a day if needed.  Marland Kitchen MAGIC MOUTHWASH W/LIDOCAINE Oral Take 5 mLs by mouth 4 (four) times daily as needed. 100 mL 0  . METRONIDAZOLE 500 MG PO TABS Oral Take 1 tablet (500 mg total) by mouth 2 (two) times daily. 14 tablet 0  . POTASSIUM CHLORIDE 20 MEQ PO PACK Oral Take 20 mEq by mouth daily.        BP 105/70  Pulse 88  Temp(Src) 98 F (36.7 C) (Oral)  Resp 16  SpO2 98%  LMP 05/03/2011  Physical Exam  Nursing note and vitals reviewed. Constitutional: She appears well-developed and well-nourished.  HENT:  Head: Normocephalic.  Eyes: Conjunctivae are normal.    Neck: Neck supple.  Abdominal: Soft. She exhibits no distension. There is no tenderness.  Genitourinary: There is no rash or tenderness on the right labia. There is no rash or tenderness on the left labia. There is tenderness around the vagina. No erythema or bleeding around the vagina. No foreign body around the vagina. No signs of injury around the vagina. Vaginal discharge found.    ED Course  Procedures (including critical care time)  Labs Reviewed  POCT URINALYSIS DIP (DEVICE) - Abnormal; Notable for the following:    Leukocytes, UA MODERATE (*) Biochemical Testing Only. Please order routine urinalysis from main lab if confirmatory testing is needed.   All other components within normal limits  WET PREP, GENITAL  GC/CHLAMYDIA PROBE AMP, GENITAL   No results found.   1. Bacterial vaginosis       MDM  Patient with increased urinary frequency along with a vaginal discharge for 2 weeks. Patient denies any pelvic pain or fevers. She describes she gets recurrence bacterial vaginosis infections.        Jimmie Molly, MD 06/04/11 [phone]

## 2011-06-04 NOTE — ED Notes (Signed)
Pt states she has urinary frequency for [redacted] weeks along with vaginal discharge that is thick and white with no odor. Denies fever.

## 2011-06-05 ENCOUNTER — Telehealth (HOSPITAL_COMMUNITY): Payer: Self-pay | Admitting: *Deleted

## 2011-06-05 NOTE — ED Notes (Signed)
GC/Chlamydia neg., Wet prep: few trich, few clue cells, WBC's TNTC.  Pt. adequately treated with Flagyl. I called pt.  Pt. verified x 2 and given results.  Pt. told she was adequately treated with Flagyl.  Pt. instructed to notify her partner to be treated with Flagyl, no sex until you have finished your medication and your partner has been treated and to practice safe sex. You can get HIV testing at the [facility].  Pt. voiced understanding and had no questions. Carrie Franco 06/05/2011

## 2011-06-06 ENCOUNTER — Ambulatory Visit (INDEPENDENT_AMBULATORY_CARE_PROVIDER_SITE_OTHER): Payer: Self-pay | Admitting: Sports Medicine

## 2011-06-06 VITALS — BP 118/78 | Ht 62.0 in | Wt 170.0 lb

## 2011-06-06 DIAGNOSIS — M654 Radial styloid tenosynovitis [de Quervain]: Secondary | ICD-10-CM

## 2011-06-06 NOTE — Progress Notes (Signed)
  Subjective:    Patient ID: Carrie Franco, female    DOB: Jan 17, 1965, 46 y.o.   MRN: [id number]  HPI Carrie Franco comes in for right hand pain for approximately 2 months.  She localizes the pain over the first extensor compartment, and notes it radiates up the forearm, and down the thumb. It's worse with any type of thumb extension. She has been doing ibuprofen, and Aleve for 2 months, that helps only minimally. She's never had an injection.  Past medical history, surgical history, family history, social history, allergies, and medications reviewed from the medical record and no changes needed.   Review of Systems    No fevers, chills, night sweats, weight loss, chest pain, or shortness of breath.  Social History: Non-smoker. Objective:   Physical Exam General:  Well developed, well nourished, and in no acute distress. Neuro:  Alert and oriented x3, extra-ocular muscles intact. Skin: Warm and dry, no rashes noted. Respiratory:  Not using accessory muscles, speaking in full sentences. Musculoskeletal: Tender to palpation over first extensor compartment on the right wrist, positive Finkelstein test. Neurovascularly intact. No tenderness to palpation over the first carpometacarpal joint.  Real-time Ultrasound Guided Injection of: right first extensor compartment. Ultrasound guided injection is preferred based studies that show increased duration, increased effect, greater accuracy, decreased procedural pain, increased response rate, and decreased cost with ultrasound guided versus blind injection. Verbal informed consent obtained. Time-out conducted. Noted no overlying erythema, induration, or other signs of local infection. Skin prepped in a sterile fashion. Local anesthesia: Topical Ethyl chloride. With sterile technique and under real time ultrasound guidance: needle advanced into the first extensor compartment, 1 cc Depo-Medrol, 2 cc lidocaine injected, tendon sheath seen  distending. Completed without difficulty Pain immediately resolved suggesting accurate placement of the medication. Advised to call if fevers/chills, erythema, induration, drainage, or persistent bleeding. Images saved.     Assessment & Plan:

## 2011-06-06 NOTE — Assessment & Plan Note (Addendum)
US guided injection as above. Cont Aleve 2 tabs BID. Wrap for 24h. Sports medicine advisor rehabilitation exercises given. RTC 3-4 weeks, prn.

## 2011-06-12 ENCOUNTER — Other Ambulatory Visit: Payer: Self-pay | Admitting: Internal Medicine

## 2011-06-22 ENCOUNTER — Encounter: Payer: Self-pay | Admitting: Internal Medicine

## 2011-06-22 ENCOUNTER — Ambulatory Visit (INDEPENDENT_AMBULATORY_CARE_PROVIDER_SITE_OTHER): Payer: Self-pay | Admitting: Internal Medicine

## 2011-06-22 VITALS — BP 131/85 | HR 73 | Temp 96.9°F | Ht 66.0 in | Wt 182.3 lb

## 2011-06-22 DIAGNOSIS — N898 Other specified noninflammatory disorders of vagina: Secondary | ICD-10-CM | POA: Insufficient documentation

## 2011-06-22 DIAGNOSIS — M654 Radial styloid tenosynovitis [de Quervain]: Secondary | ICD-10-CM

## 2011-06-22 MED ORDER — CYCLOBENZAPRINE HCL 5 MG PO TABS: 5.0000 mg | ORAL_TABLET | Freq: Three times a day (TID) | ORAL | Status: DC | PRN

## 2011-06-22 MED ORDER — FUROSEMIDE 20 MG PO TABS: 20.0000 mg | ORAL_TABLET | Freq: Every day | ORAL | Status: DC

## 2011-06-22 MED ORDER — METRONIDAZOLE 500 MG PO TABS: 2000.0000 mg | ORAL_TABLET | Freq: Once | ORAL | Status: AC

## 2011-06-22 NOTE — Assessment & Plan Note (Signed)
Patient states she plans on continuing to follow with sports medicine.

## 2011-06-22 NOTE — Patient Instructions (Signed)
Schedule a followup appointment with your primary care doctor in 3 months or sooner if needed. I will call you to discuss your results.

## 2011-06-22 NOTE — Progress Notes (Signed)
Patient ID: NOHEMI NICKLAUS, female   DOB: 09-15-1965, 46 y.o.   MRN: [id number] Subjective:     HPI: Patient is a 46 y/o F who presents today for routine follow up.     Right thumb pain: patient is currently following with sports medicine for treatment of DeQuervain's tenosynovitis.  At her last office visit with sports medicine she received an ultrasound-guided steroid injection, was advised to continue taking 2 Aleve tablets twice a day, and was provided with specific exercises to help her symptoms.  Vaginal discharge: patient states she went to [facility] for evaluation of vaginal discharge.  She states she was diagnosed with trichomonas and she received treatment with metronidazole. She states her partner was also treated. Unfortunately, she states that they had sexual intercourse before weaning the prescribed 7-10 days. She is concerned that her infection was not fully treated or may have recurred. She denies any current pelvic pain or abnormal discharge.   Review of Systems Constitutional: Negative for fever, chills, diaphoresis, activity change, appetite change, fatigue and unexpected weight change.  HENT: Negative for hearing loss, congestion and neck stiffness.   Eyes: Negative for photophobia, pain and visual disturbance.  Respiratory: Negative for cough, chest tightness, shortness of breath and wheezing.   Cardiovascular: Negative for chest pain and palpitations.  Gastrointestinal: Negative for abdominal pain, blood in stool and anal bleeding.  Genitourinary: Negative for dysuria, hematuria and difficulty urinating.  Musculoskeletal: Negative for joint swelling.  Neurological: Negative for syncope, speech difficulty, weakness, numbness and headaches.      Objective:   Physical Exam VItal signs reviewed and stable. GEN: No apparent distress.  Alert and oriented x 3.  Pleasant, conversant, and cooperative to exam. HEENT: head is autraumatic and normocephalic.  Neck is supple without palpable  masses or lymphadenopathy.  No JVD or carotid bruits.  Vision intact.  EOMI.  PERRLA.  Sclerae anicteric.  Conjunctivae without pallor or injection. Mucous membranes are moist.  Oropharynx is without erythema, exudates, or other abnormal lesions.   RESP:  Lungs are clear to ascultation bilaterally with good air movement.  No wheezes, ronchi, or rubs. CARDIOVASCULAR: regular rate, normal rhythm.  Clear S1, S2, no murmurs, gallops, or rubs. ABDOMEN: soft, non-tender, non-distended.  Bowels sounds present in all quadrants and normoactive.  No palpable masses. EXT: warm and dry.  Peripheral pulses equal, intact, and +2 globally.  No clubbing or cyanosis.  No edema in bilateral lower extremities.  TTP of posterior thumb extending from wrist to DIP.  Finger strength and ROM fully intact; grip slightly diminished as a result of pain. SKIN: warm and dry with normal turgor.  No rashes or abnormal lesions observed. NEURO: CN II-XII grossly intact.  Muscle strength +5/5 in bilateral upper and lower extremities.  Sensation is grossly intact.  No focal deficit.     Assessment/Plan:

## 2011-06-22 NOTE — Assessment & Plan Note (Addendum)
Patient reports recent diagnosis of trichomonas. She engaged in sexual intercourse with her infected partner before weaning the prescribed 7 days after treatment with metronidazole. Normally, would not repeat testing to establish care however in this setting where she may have been reinfected, will assess with a urine analysis. I've also provided patient a prescription for 2000 mg of Flagyl for both her and her partner. I will contact her once the results return to advise her whether she needs to take flagyl or not. Patient is advised to return to the clinic or go to urgent care/ER should she develop worsening pelvic pain, fever, nausea vomiting diarrhea, abnormal vaginal discharge, or abnormal vaginal lesions.  Patient states she was tested for HIV in urgent care. Advised patient she will need to undergo repeat testing in 6 months to ensure she has not been affected. Patient expresses understanding of this.

## 2011-06-23 LAB — URINALYSIS, ROUTINE W REFLEX MICROSCOPIC
Bilirubin Urine: NEGATIVE
Glucose, UA: NEGATIVE mg/dL
Hgb urine dipstick: NEGATIVE
Ketones, ur: NEGATIVE mg/dL
Leukocytes, UA: NEGATIVE
pH: 7 (ref 5.0–8.0)

## 2011-07-03 ENCOUNTER — Ambulatory Visit: Payer: Self-pay | Admitting: Sports Medicine

## 2011-07-09 ENCOUNTER — Emergency Department (HOSPITAL_BASED_OUTPATIENT_CLINIC_OR_DEPARTMENT_OTHER): Payer: No Typology Code available for payment source

## 2011-07-09 ENCOUNTER — Emergency Department (HOSPITAL_BASED_OUTPATIENT_CLINIC_OR_DEPARTMENT_OTHER)
Admission: EM | Admit: 2011-07-09 | Discharge: 2011-07-09 | Disposition: A | Discharge status: Home / Self Care | Payer: No Typology Code available for payment source | Attending: Emergency Medicine | Admitting: Emergency Medicine

## 2011-07-09 ENCOUNTER — Encounter (HOSPITAL_BASED_OUTPATIENT_CLINIC_OR_DEPARTMENT_OTHER): Payer: Self-pay | Admitting: Family Medicine

## 2011-07-09 DIAGNOSIS — I10 Essential (primary) hypertension: Secondary | ICD-10-CM | POA: Insufficient documentation

## 2011-07-09 DIAGNOSIS — Z87891 Personal history of nicotine dependence: Secondary | ICD-10-CM | POA: Insufficient documentation

## 2011-07-09 DIAGNOSIS — E785 Hyperlipidemia, unspecified: Secondary | ICD-10-CM | POA: Insufficient documentation

## 2011-07-09 DIAGNOSIS — M542 Cervicalgia: Secondary | ICD-10-CM | POA: Insufficient documentation

## 2011-07-09 DIAGNOSIS — Z880 Allergy status to penicillin: Secondary | ICD-10-CM | POA: Insufficient documentation

## 2011-07-09 DIAGNOSIS — M25569 Pain in unspecified knee: Secondary | ICD-10-CM | POA: Insufficient documentation

## 2011-07-09 DIAGNOSIS — S40011A Contusion of right shoulder, initial encounter: Secondary | ICD-10-CM

## 2011-07-09 DIAGNOSIS — M25519 Pain in unspecified shoulder: Secondary | ICD-10-CM | POA: Insufficient documentation

## 2011-07-09 DIAGNOSIS — J45909 Unspecified asthma, uncomplicated: Secondary | ICD-10-CM | POA: Insufficient documentation

## 2011-07-09 MED ORDER — IBUPROFEN 800 MG PO TABS: 800.0000 mg | ORAL_TABLET | Freq: Three times a day (TID) | ORAL | Status: DC

## 2011-07-09 MED ORDER — HYDROCODONE-ACETAMINOPHEN 5-325 MG PO TABS: 2.0000 | ORAL_TABLET | ORAL | Status: DC | PRN

## 2011-07-09 NOTE — ED Provider Notes (Signed)
Medical screening examination/treatment/procedure(s) were performed by non-physician practitioner and as supervising physician I was immediately available for consultation/collaboration.   Hailei Besser, MD 07/09/11 2353 
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
## 2011-07-12 ENCOUNTER — Encounter (HOSPITAL_COMMUNITY): Payer: Self-pay | Admitting: *Deleted

## 2011-07-12 ENCOUNTER — Emergency Department (HOSPITAL_COMMUNITY)
Admission: EM | Admit: 2011-07-12 | Discharge: 2011-07-13 | Disposition: A | Discharge status: Home / Self Care | Payer: No Typology Code available for payment source | Attending: Emergency Medicine | Admitting: Emergency Medicine

## 2011-07-12 DIAGNOSIS — M25519 Pain in unspecified shoulder: Secondary | ICD-10-CM | POA: Insufficient documentation

## 2011-07-12 DIAGNOSIS — Z880 Allergy status to penicillin: Secondary | ICD-10-CM | POA: Insufficient documentation

## 2011-07-12 DIAGNOSIS — I10 Essential (primary) hypertension: Secondary | ICD-10-CM | POA: Insufficient documentation

## 2011-07-12 DIAGNOSIS — T148XXA Other injury of unspecified body region, initial encounter: Secondary | ICD-10-CM

## 2011-07-12 DIAGNOSIS — M542 Cervicalgia: Secondary | ICD-10-CM | POA: Insufficient documentation

## 2011-07-12 DIAGNOSIS — Z87828 Personal history of other (healed) physical injury and trauma: Secondary | ICD-10-CM | POA: Insufficient documentation

## 2011-07-12 DIAGNOSIS — K5909 Other constipation: Secondary | ICD-10-CM | POA: Insufficient documentation

## 2011-07-12 DIAGNOSIS — E785 Hyperlipidemia, unspecified: Secondary | ICD-10-CM | POA: Insufficient documentation

## 2011-07-12 DIAGNOSIS — Z87891 Personal history of nicotine dependence: Secondary | ICD-10-CM | POA: Insufficient documentation

## 2011-07-12 DIAGNOSIS — J45909 Unspecified asthma, uncomplicated: Secondary | ICD-10-CM | POA: Insufficient documentation

## 2011-07-12 DIAGNOSIS — Z91018 Allergy to other foods: Secondary | ICD-10-CM | POA: Insufficient documentation
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
## 2011-07-13 MED ORDER — CYCLOBENZAPRINE HCL 10 MG PO TABS: 10.0000 mg | ORAL_TABLET | Freq: Three times a day (TID) | ORAL | Status: AC | PRN
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
## 2011-07-14 NOTE — ED Provider Notes (Signed)
Medical screening examination/treatment/procedure(s) were performed by non-physician practitioner and as supervising physician I was immediately available for consultation/collaboration.   Tabor Denham E Cynia Abruzzo, MD 07/14/11 1629 

## 2011-07-17 ENCOUNTER — Telehealth: Payer: Self-pay | Admitting: *Deleted

## 2011-07-17 NOTE — Telephone Encounter (Signed)
Agree with plan 
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
## 2011-07-25 ENCOUNTER — Other Ambulatory Visit: Payer: Self-pay | Admitting: Internal Medicine

## 2011-09-27 ENCOUNTER — Encounter (HOSPITAL_COMMUNITY): Payer: Self-pay | Admitting: Emergency Medicine

## 2011-09-27 ENCOUNTER — Emergency Department (HOSPITAL_COMMUNITY)
Admission: EM | Admit: 2011-09-27 | Discharge: 2011-09-27 | Disposition: A | Discharge status: Home / Self Care | Payer: Self-pay | Attending: Emergency Medicine | Admitting: Emergency Medicine

## 2011-09-27 DIAGNOSIS — Z880 Allergy status to penicillin: Secondary | ICD-10-CM | POA: Insufficient documentation

## 2011-09-27 DIAGNOSIS — Z87891 Personal history of nicotine dependence: Secondary | ICD-10-CM | POA: Insufficient documentation

## 2011-09-27 DIAGNOSIS — J45909 Unspecified asthma, uncomplicated: Secondary | ICD-10-CM | POA: Insufficient documentation

## 2011-09-27 DIAGNOSIS — T2000XA Burn of unspecified degree of head, face, and neck, unspecified site, initial encounter: Secondary | ICD-10-CM

## 2011-09-27 DIAGNOSIS — I10 Essential (primary) hypertension: Secondary | ICD-10-CM | POA: Insufficient documentation

## 2011-09-27 DIAGNOSIS — X038XXA Other exposure to controlled fire, not in building or structure, initial encounter: Secondary | ICD-10-CM | POA: Insufficient documentation

## 2011-09-27 DIAGNOSIS — T2019XA Burn of first degree of multiple sites of head, face, and neck, initial encounter: Secondary | ICD-10-CM | POA: Insufficient documentation

## 2011-09-27 DIAGNOSIS — E785 Hyperlipidemia, unspecified: Secondary | ICD-10-CM | POA: Insufficient documentation

## 2011-09-27 DIAGNOSIS — Z23 Encounter for immunization: Secondary | ICD-10-CM | POA: Insufficient documentation

## 2011-09-27 MED ORDER — TETANUS-DIPHTH-ACELL PERTUSSIS 5-2.5-18.5 LF-MCG/0.5 IM SUSP
0.5000 mL | Freq: Once | INTRAMUSCULAR | Status: AC
  Administered 2011-09-27: 0.5 mL via INTRAMUSCULAR
  Filled 2011-09-27: qty 0.5

## 2011-09-27 MED ORDER — FLUORESCEIN SODIUM 1 MG OP STRP
ORAL_STRIP | OPHTHALMIC | Status: AC
  Administered 2011-09-27: 13:00:00
  Filled 2011-09-27: qty 1

## 2011-09-27 MED ORDER — FENTANYL CITRATE 0.05 MG/ML IJ SOLN
INTRAMUSCULAR | Status: AC
  Filled 2011-09-27: qty 2

## 2011-09-27 MED ORDER — OXYCODONE-ACETAMINOPHEN 5-325 MG PO TABS: 2.0000 | ORAL_TABLET | ORAL | Status: AC | PRN

## 2011-09-27 MED ORDER — HYDROMORPHONE HCL PF 2 MG/ML IJ SOLN
2.0000 mg | Freq: Once | INTRAMUSCULAR | Status: AC
  Administered 2011-09-27: 2 mg via INTRAMUSCULAR
  Filled 2011-09-27: qty 1

## 2011-09-27 MED ORDER — ONDANSETRON HCL 4 MG/2ML IJ SOLN
INTRAMUSCULAR | Status: AC
  Administered 2011-09-27: 4 mg via INTRAVENOUS
  Filled 2011-09-27: qty 2

## 2011-09-27 MED ORDER — ONDANSETRON HCL 4 MG/2ML IJ SOLN
4.0000 mg | Freq: Once | INTRAMUSCULAR | Status: AC
  Administered 2011-09-27: 4 mg via INTRAVENOUS

## 2011-09-27 MED ORDER — TETRACAINE HCL 0.5 % OP SOLN
2.0000 [drp] | Freq: Once | OPHTHALMIC | Status: AC
  Administered 2011-09-27: 2 [drp] via OPHTHALMIC
  Filled 2011-09-27: qty 2

## 2011-09-27 MED ORDER — FENTANYL CITRATE 0.05 MG/ML IJ SOLN
50.0000 ug | Freq: Once | INTRAMUSCULAR | Status: AC
  Administered 2011-09-27: 50 ug via INTRAVENOUS

## 2011-09-27 MED ORDER — BACITRACIN ZINC 500 UNIT/GM EX OINT: TOPICAL_OINTMENT | Freq: Two times a day (BID) | CUTANEOUS | Status: AC

## 2011-09-27 NOTE — ED Notes (Signed)
MD at bedside. 

## 2011-09-27 NOTE — ED Notes (Signed)
Has some blsitering on jaw line
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
## 2011-09-28 ENCOUNTER — Ambulatory Visit: Payer: Self-pay | Admitting: Internal Medicine

## 2011-10-02 ENCOUNTER — Ambulatory Visit: Payer: Self-pay | Admitting: Internal Medicine

## 2011-10-02 ENCOUNTER — Telehealth: Payer: Self-pay | Admitting: *Deleted
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
## 2011-10-03 ENCOUNTER — Other Ambulatory Visit: Payer: Self-pay | Admitting: Internal Medicine

## 2011-10-03 DIAGNOSIS — Z1231 Encounter for screening mammogram for malignant neoplasm of breast: Secondary | ICD-10-CM

## 2011-10-04 ENCOUNTER — Ambulatory Visit (INDEPENDENT_AMBULATORY_CARE_PROVIDER_SITE_OTHER): Payer: Self-pay | Admitting: Internal Medicine

## 2011-10-04 VITALS — BP 153/91 | HR 82 | Temp 97.7°F | Wt 184.3 lb

## 2011-10-04 DIAGNOSIS — R252 Cramp and spasm: Secondary | ICD-10-CM

## 2011-10-04 DIAGNOSIS — G4762 Sleep related leg cramps: Secondary | ICD-10-CM

## 2011-10-04 DIAGNOSIS — T2000XA Burn of unspecified degree of head, face, and neck, unspecified site, initial encounter: Secondary | ICD-10-CM

## 2011-10-04 HISTORY — DX: Sleep related leg cramps: G47.62

## 2011-10-04 MED ORDER — POTASSIUM CHLORIDE ER 10 MEQ PO TBCR: 20.0000 meq | EXTENDED_RELEASE_TABLET | Freq: Every day | ORAL | Status: DC

## 2011-10-04 NOTE — Patient Instructions (Signed)
1.  Continue putting the anti-bacterial cream on the areas where there are skin break down to prevent infection.  2.  Start the Potassium 10 mEq tablets.  Take 2 tablets daily for your potassium  3.  Stop in the lab to have your blood drawn.  I will call you about the potassium if we need to adjust it.  4.  Follow up with your primary care doctor in about 1-2 months.
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
## 2011-10-05 LAB — BASIC METABOLIC PANEL
CO2: 23 mEq/L (ref 19–32)
Chloride: 104 mEq/L (ref 96–112)
Creat: 0.79 mg/dL (ref 0.50–1.10)
Glucose, Bld: 116 mg/dL — ABNORMAL HIGH (ref 70–99)
Sodium: 138 mEq/L (ref 135–145)

## 2011-10-15 ENCOUNTER — Ambulatory Visit (HOSPITAL_COMMUNITY)
Admission: RE | Admit: 2011-10-15 | Discharge: 2011-10-15 | Disposition: A | Discharge status: Home / Self Care | Payer: No Typology Code available for payment source | Source: Ambulatory Visit | Attending: Internal Medicine | Admitting: Internal Medicine

## 2011-10-15 DIAGNOSIS — Z1231 Encounter for screening mammogram for malignant neoplasm of breast: Secondary | ICD-10-CM

## 2011-10-26 ENCOUNTER — Ambulatory Visit (INDEPENDENT_AMBULATORY_CARE_PROVIDER_SITE_OTHER): Payer: Self-pay | Admitting: Internal Medicine

## 2011-10-26 VITALS — BP 145/94 | HR 100 | Wt 184.3 lb

## 2011-10-26 DIAGNOSIS — G5600 Carpal tunnel syndrome, unspecified upper limb: Secondary | ICD-10-CM

## 2011-10-26 DIAGNOSIS — G5603 Carpal tunnel syndrome, bilateral upper limbs: Secondary | ICD-10-CM

## 2011-10-26 HISTORY — DX: Carpal tunnel syndrome, unspecified upper limb: G56.00

## 2011-10-26 MED ORDER — MELOXICAM 7.5 MG PO TABS: 7.5000 mg | ORAL_TABLET | Freq: Every day | ORAL | Status: DC

## 2011-10-26 NOTE — Assessment & Plan Note (Signed)
Patient is having bilateral hand pain which is concerning for carpal tunnel syndrome TSH was normal 3 months ago. HBa1C was 4.9. Patient does have normal CBC 2 years ago (Vit B12) Advised weight loss. Wrist splint both sides at noght time. Mobic for pain control. Follow up as needed.

## 2011-10-26 NOTE — Progress Notes (Signed)
  Subjective:    Patient ID: Carrie Franco, female    DOB: 05-21-1965, 46 y.o.   MRN: [id number]  HPI Patient has pain in both hands which is worse at night time. Right is worse than left. Pain is associated with tingling and numbness in fingertips. 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 She did take percocet from one of her friends which helped with the pain. She is having trouble sleeping at night time.   No other complaints at this time.   Review of Systems  All other systems reviewed and are negative.       Objective:   Physical Exam  Constitutional: She is oriented to person, place, and time. She appears well-developed and well-nourished.  HENT:  Head: Normocephalic and atraumatic.  Eyes: Conjunctivae normal and EOM are normal. Pupils are equal, round, and reactive to light. No scleral icterus.  Neck: Normal range of motion. Neck supple. No JVD present. No thyromegaly present.  Cardiovascular: Normal rate, regular rhythm, normal heart sounds and intact distal pulses.  Exam reveals no gallop and no friction rub.   No murmur heard. Pulmonary/Chest: Effort normal and breath sounds normal. No respiratory distress. She has no wheezes. She has no rales.  Abdominal: Soft. Bowel sounds are normal. She exhibits no distension and no mass. There is no tenderness. There is no rebound and no guarding.  Musculoskeletal: Normal range of motion. She exhibits no edema and no tenderness.       phelen's sign negative Tinel sign produced tingling over right index fingertips.  Lymphadenopathy:    She has no cervical adenopathy.  Neurological: She is alert and oriented to person, place, and time.  Psychiatric: She has a normal mood and affect. Her behavior is normal.          Assessment & Plan:

## 2011-10-26 NOTE — Patient Instructions (Signed)
You can pick up wrist splints at [facility]. Wear them every night for your hand numbness and pain. Take Mobic twice daily for pain. Call or return to clinic if your symptoms don't improve.

## 2011-10-26 NOTE — Progress Notes (Signed)
Subjective:     Patient ID: Carrie Franco, female   DOB: 09-11-1965, 46 y.o.   MRN: [id number]  HPI Ms. Lucchese presents today with a chief complaint of bilateral hand pain and numbness, worse in her right hand than her left. Her symptoms began approximately two weeks ago, with greater frequency in the last week. Her pain, numbness, and tingling come and go, and are worst at night. She is frequently woken up by pain in the middle of the night, worse in her right hand. She describes putting her hands under cold water to "get the feeling back". The pain is keeping her from sleeping well, and she feels she is dropping things more frequently. There is no numbness or tingling above her wrists and no shooting radicular pain. She denies numbness or tingling elsewhere. No weakness, heat/cold intolerance, heart palpitations, skin/hair changes. No history of anemia. Recent TSH, CBC, and Hb A1c labs did not suggest an etiology of peripheral neuropathy. 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  Review of Systems  Constitutional: Negative for fever, chills and activity change.  HENT: Negative for neck pain.   Respiratory: Negative for shortness of breath.   Cardiovascular: Negative for chest pain.  Gastrointestinal: Positive for constipation. Negative for nausea, vomiting, abdominal pain and diarrhea.  Genitourinary: Negative for dysuria and difficulty urinating.  Musculoskeletal: Negative for back pain.  Skin: Negative for color change and rash.  Neurological: Positive for numbness. Negative for dizziness, speech difficulty, weakness and light-headedness.       Objective:   Physical Exam  Constitutional: She is oriented to person, place, and time. She appears well-developed and well-nourished. No distress.  HENT:  Head: Normocephalic and atraumatic.  Eyes: EOM are normal. Pupils are equal, round, and reactive to light.  Neck: No thyromegaly present.    Cardiovascular: Normal rate, regular rhythm and normal heart sounds.   Pulmonary/Chest: Effort normal and breath sounds normal.  Musculoskeletal:       Right wrist: She exhibits tenderness. She exhibits normal range of motion and no swelling.       Left wrist: She exhibits normal range of motion, no tenderness and no swelling.  Neurological: She is alert and oriented to person, place, and time. She has normal strength. She displays no atrophy. A sensory deficit is present.  Reflex Scores:      Bicep reflexes are 2+ on the right side and 2+ on the left side.      Brachioradialis reflexes are 2+ on the right side and 2+ on the left side.      Patellar reflexes are 2+ on the right side and 2+ on the left side.      Positive Tinel's sign on the R wrist. Decreased sensation to pain, temperature, and light touch in the R hand; worst over the thumb and first two digits, put present over the whole hand.  Skin: Skin is warm and dry. No rash noted.       Assessment:     1. Hand pain, numbness, tingling, and sensory defect - Symptoms and exam findings are most consistent with carpal tunnel syndrome, particularly given her description of waking up with hand pain, numbness, and tingling. Her sensory loss is not limited to the median nerve distribution, however. The differential diagnosis includes other causes of peripheral neuropathy, such as B12 deficiency, thyroid disease, or diabetic neuropathy. Her recent labs make these less likely.    Plan:  1. Carpel tunnel syndrome - The patient was instructed to use wrist splints while sleeping and to avoid excessive use of her hands. She will take Mobic twice daily for pain and wrist inflammation. She should call if her symptoms do not improve, for likely referral to a neurologist.

## 2011-12-18 ENCOUNTER — Other Ambulatory Visit: Payer: Self-pay | Admitting: *Deleted

## 2011-12-18 DIAGNOSIS — R252 Cramp and spasm: Secondary | ICD-10-CM

## 2011-12-19 MED ORDER — POTASSIUM CHLORIDE ER 10 MEQ PO TBCR: 20.0000 meq | EXTENDED_RELEASE_TABLET | Freq: Every day | ORAL | Status: DC

## 2011-12-19 NOTE — Addendum Note (Signed)
Addended by: Margarito Liner on: 12/19/2011 03:48 PM   Modules accepted: Orders

## 2012-03-08 ENCOUNTER — Other Ambulatory Visit: Payer: Self-pay | Admitting: Internal Medicine

## 2012-03-11 ENCOUNTER — Encounter: Payer: Self-pay | Admitting: Internal Medicine

## 2012-03-11 ENCOUNTER — Ambulatory Visit (INDEPENDENT_AMBULATORY_CARE_PROVIDER_SITE_OTHER): Payer: Self-pay | Admitting: Internal Medicine

## 2012-03-11 VITALS — BP 138/90 | HR 91 | Temp 96.6°F | Ht 66.0 in | Wt 199.4 lb

## 2012-03-11 DIAGNOSIS — M25512 Pain in left shoulder: Secondary | ICD-10-CM

## 2012-03-11 DIAGNOSIS — M25519 Pain in unspecified shoulder: Secondary | ICD-10-CM

## 2012-03-11 DIAGNOSIS — M654 Radial styloid tenosynovitis [de Quervain]: Secondary | ICD-10-CM

## 2012-03-11 DIAGNOSIS — K5909 Other constipation: Secondary | ICD-10-CM

## 2012-03-11 DIAGNOSIS — I10 Essential (primary) hypertension: Secondary | ICD-10-CM

## 2012-03-11 LAB — COMPLETE METABOLIC PANEL WITH GFR
AST: 16 U/L (ref 0–37)
BUN: 11 mg/dL (ref 6–23)
CO2: 22 mEq/L (ref 19–32)
Calcium: 9.2 mg/dL (ref 8.4–10.5)
Chloride: 110 mEq/L (ref 96–112)
Creat: 0.76 mg/dL (ref 0.50–1.10)
GFR, Est African American: >89 mL/min
Total Bilirubin: 0.3 mg/dL (ref 0.3–1.2)

## 2012-03-11 LAB — CBC
Platelets: 232 10*3/uL (ref 150–400)
RBC: 4.39 MIL/uL (ref 3.87–5.11)
WBC: 5.1 10*3/uL (ref 4.0–10.5)

## 2012-03-11 LAB — LIPID PANEL
Cholesterol: 183 mg/dL (ref 0–200)
HDL: 54 mg/dL (ref >39)
Total CHOL/HDL Ratio: 3.4 Ratio

## 2012-03-11 MED ORDER — IBUPROFEN 800 MG PO TABS: 800.0000 mg | ORAL_TABLET | Freq: Three times a day (TID) | ORAL | Status: DC | PRN

## 2012-03-11 MED ORDER — DOCUSATE SODIUM 100 MG PO CAPS: 100.0000 mg | ORAL_CAPSULE | Freq: Every day | ORAL | Status: DC

## 2012-03-11 NOTE — Assessment & Plan Note (Addendum)
Patient is currently on a medication which we don't have on file. She will call back to to inform. In out records she is currently only on Lasix 20 mg daily. I will obtain basic metabolic panel today.  Patient called and informed that she is taking Ziac 5-6.25 mg bid

## 2012-03-11 NOTE — Patient Instructions (Addendum)
Call if your pain is not better

## 2012-03-11 NOTE — Assessment & Plan Note (Signed)
Concerning for rotator cuff tendinopathy/ radiculopathy. Prescribed ibuprofen 800 mg every 6 hours for the next 2 weeks. I'll obtain basic metabolic panel to evaluate renal function. Patient has currently no insurance but she would benefit from physical therapy.

## 2012-03-11 NOTE — Progress Notes (Signed)
Subjective:   Patient ID: Carrie Franco female   DOB: 1965/04/12 47 y.o.   MRN: [id number]  HPI: [name] is a 47 y.o. female with past medical history significant as outlined below who presented with right hand  And left upper arm pain.  Right thumb: Patient reports she has been experiencing right thumb pain for some time but recently it got worse patient had been diagnosed in the past with de Quervain's tenosynovitis.  Patient reports she is currently wearing a splint on a regular basis in the setting of carpal tunnel syndrome which has significantly improved. Due to the pain in the time she occasionally has trouble to pick up things. Denies any numbness or tingling.  Left shoulder pain; Patient reports about left arm pain. She noted it started couple of weeks ago without any significant weakness, numbness or tingling. It is just an achy pain which comes and goes. She reports that she has been doing exercises but no heavy lifting. Denies any trauma.     Past Medical History  Diagnosis Date  . High risk sexual behavior   . Hypertension   . Bacterial vaginitis     recurrent  . Recurrent UTI   . Genital herpes   . Recurrent boils   . External hemorrhoids with complication   . Allergic rhinitis   . Chronic constipation   . History of cocaine abuse 1992  . History of tobacco abuse 1999  . Bilateral carpal tunnel syndrome   . Acne   . Hyperlipidemia   . Asthma    Current Outpatient Prescriptions  Medication Sig Dispense Refill  . albuterol (PROVENTIL HFA;VENTOLIN HFA) 108 (90 BASE) MCG/ACT inhaler Inhale 2 puffs into the lungs every 4 (four) hours as needed. For shortness of breath      . docusate sodium (COLACE) 100 MG capsule Take 100 mg by mouth daily. For constipation      . furosemide (LASIX) 20 MG tablet Take 1 tablet (20 mg total) by mouth daily.  30 tablet  6  . KLOR-CON M10 10 MEQ tablet TAKE 2 TABLETS BYMOUTH ONCE DAILY  60 tablet  1  . loratadine (CLARITIN) 10 MG  tablet Take 10 mg by mouth daily as needed. For allergies      . meloxicam (MOBIC) 7.5 MG tablet Take 1 tablet (7.5 mg total) by mouth daily.  30 tablet  2   No current facility-administered medications for this visit.   Family History  Problem Relation Age of Onset  . Cancer Mother   . Diabetes Other    History   Social History  . Marital Status: Single    Spouse Name: N/A    Number of Children: N/A  . Years of Education: N/A   Social History Main Topics  . Smoking status: Former Smoker    Types: Cigarettes  . Smokeless tobacco: None     Comment: quit 43yrs ago  . Alcohol Use: No  . Drug Use: No  . Sexually Active: Yes   Other Topics Concern  . None   Social History Narrative    The patient works at a daycare center, she completed high school, is single   Review of Systems: Constitutional: Denies fever, chills, diaphoresis, appetite change and fatigue.  Respiratory: Denies SOB, DOE, cough, chest tightness,  and wheezing.   Cardiovascular: Denies chest pain, palpitations and leg swelling.  Skin: Denies pallor, rash and wound.   Objective:  Physical Exam: Filed Vitals:   03/11/12 1051 03/11/12  1125  BP: 135/93 138/90  Pulse: 91   Temp: 96.6 F (35.9 C)   TempSrc: Oral   Height: 5\' 6"  (1.676 m)   Weight: 199 lb 6.4 oz (90.447 kg)   SpO2: 99%    Constitutional: Vital signs reviewed.  Patient is a well-developed and well-nourished female in no acute distress and cooperative with exam. Alert and oriented x3.  Neck: Supple,  Cardiovascular: RRR, S1 normal, S2 normal, no MRG, pulses symmetric and intact bilaterally Pulmonary/Chest: CTAB, no wheezes, rales, or rhonchi Abdominal: Soft. Non-tender, non-distended, bowel sounds are normal,  Musculoskeletal: Left shoulder: tender to palpation in the anterior area, pain with elevation above 120 degrees, pain with abduction but not adduction, pain with elevation . Her pain is located in the left shoulder area radiating to her  arm to the elbow.  Right thumb: Tenderness to palpation at the base of the right thumb with mild swelling present. No joint deformities, erythema, or stiffness. Range of motion is normal.  Neurological: A&O x3, Strength is normal and symmetric bilaterally, csensory intact to light touch bilaterally.

## 2012-03-12 ENCOUNTER — Other Ambulatory Visit: Payer: Self-pay | Admitting: Internal Medicine

## 2012-03-12 DIAGNOSIS — I10 Essential (primary) hypertension: Secondary | ICD-10-CM

## 2012-03-12 MED ORDER — BISOPROLOL-HYDROCHLOROTHIAZIDE 5-6.25 MG PO TABS: 1.0000 | ORAL_TABLET | Freq: Every day | ORAL | Status: DC

## 2012-03-12 NOTE — Assessment & Plan Note (Signed)
Patient reports that she has been taking Ziac bid for 6 days and one day she would only take lasix 20 mg. She told me today that she has been not taking Ziac for a few days.  I will d/c lasix. Recommended to take Ziac every day.

## 2012-03-25 ENCOUNTER — Ambulatory Visit: Payer: Self-pay | Admitting: Internal Medicine

## 2012-04-17 ENCOUNTER — Encounter: Payer: Self-pay | Admitting: Internal Medicine

## 2012-04-29 ENCOUNTER — Other Ambulatory Visit: Payer: Self-pay | Admitting: Internal Medicine

## 2012-05-07 ENCOUNTER — Encounter: Payer: Self-pay | Admitting: Internal Medicine

## 2012-05-07 ENCOUNTER — Ambulatory Visit (INDEPENDENT_AMBULATORY_CARE_PROVIDER_SITE_OTHER): Payer: Self-pay | Admitting: Internal Medicine

## 2012-05-07 VITALS — BP 132/83 | HR 69 | Temp 97.5°F | Ht 66.0 in | Wt 198.2 lb

## 2012-05-07 DIAGNOSIS — H6120 Impacted cerumen, unspecified ear: Secondary | ICD-10-CM

## 2012-05-07 DIAGNOSIS — H9209 Otalgia, unspecified ear: Secondary | ICD-10-CM

## 2012-05-07 DIAGNOSIS — H6121 Impacted cerumen, right ear: Secondary | ICD-10-CM | POA: Insufficient documentation

## 2012-05-07 DIAGNOSIS — H9202 Otalgia, left ear: Secondary | ICD-10-CM

## 2012-05-07 DIAGNOSIS — M654 Radial styloid tenosynovitis [de Quervain]: Secondary | ICD-10-CM

## 2012-05-07 MED ORDER — IBUPROFEN 800 MG PO TABS: 800.0000 mg | ORAL_TABLET | Freq: Three times a day (TID) | ORAL | Status: DC | PRN

## 2012-05-07 NOTE — Assessment & Plan Note (Signed)
Assessment: Right ear with soft cerumen buildup. Pt requesting irrigation.  Plan:      Purnell Shoemaker to perform ear irrigation to attempt cerumen removal.

## 2012-05-07 NOTE — Patient Instructions (Signed)
General Instructions:  Please follow-up at the clinic in 1 month, at which time we will reevaluate your blood pressure, ear pain - OR, please follow-up in the clinic sooner if needed.  There have not been changes in your medications: I refilled your ibuprofen  If symptoms worsen, or new symptoms arise, please call the clinic or go to the ER.  PLEASE BRING ALL OF YOUR MEDICATIONS  IN A BAG TO YOUR NEXT APPOINTMENT   Treatment Goals:  Goals (1 Years of Data) as of 05/07/12         As of Today 03/11/12 03/11/12 10/26/11 10/04/11     Blood Pressure    . Blood Pressure < 140/90  132/83 138/90 135/93 145/94 153/91      Progress Toward Treatment Goals:    Self Care Goals & Plans:  Self Care Goal 03/11/2012  Manage my medications take my medicines as prescribed  Be physically active take a walk every day

## 2012-05-07 NOTE — Progress Notes (Signed)
   Patient: Carrie Franco   MRN: [id number]  DOB: Aug 17, 1965  PCP: Larey Seat, MD   Subjective:    CC: Otalgia, Hand Pain and Medication Refill   HPI: Carrie Franco is a 47 y.o. female with a PMHx as outlined below, who presented to clinic today for the following:  1) Left ear pain - pt describes a 2 weeks history of left ear pain. Denies associated no fever, no pressure and fullness, no drainage, hearing loss, itching. States that she felt there to be a small boil in her ear, which she is picked at 1 week ago with scab removed, it resolved for 2 days, then it returned. Denies fevers, chills, nasal congestion, itchy watery eyes, vomiting/diarrhea.   Review of Systems: Per HPI.    Current Outpatient Medications: Medication Sig  . bisoprolol-hydrochlorothiazide (ZIAC) 5-6.25 MG per tablet TAKE ONE TABLET BY MOUTH ONCE DAILY  . docusate sodium (COLACE) 100 MG capsule Take 1 capsule (100 mg total) by mouth daily. For constipation  . loratadine (CLARITIN) 10 MG tablet Take 10 mg by mouth daily as needed. For allergies  . albuterol (PROVENTIL HFA;VENTOLIN HFA) 108 (90 BASE) MCG/ACT inhaler Inhale 2 puffs into the lungs every 4 (four) hours as needed. For shortness of breath  . ibuprofen (ADVIL,MOTRIN) 800 MG tablet Take 1 tablet (800 mg total) by mouth every 8 (eight) hours as needed for pain.    Allergies  Allergen Reactions  . Orange Fruit (Citrus) Hives    Pt describes reaction as big bumps  . Penicillins Hives    Past Medical History  Diagnosis Date  . High risk sexual behavior   . Hypertension   . Bacterial vaginitis     recurrent  . Recurrent UTI   . Genital herpes   . Recurrent boils   . External hemorrhoids with complication   . Allergic rhinitis   . Chronic constipation   . History of cocaine abuse 1992  . History of tobacco abuse 1999  . Bilateral carpal tunnel syndrome   . Acne   . Hyperlipidemia   . Asthma     Objective:    Physical Exam: Filed  Vitals:   05/07/12 1322  BP: 132/83  Pulse: 69  Temp: 97.5 F (36.4 C)    General: Vital signs reviewed and noted. Well-developed, well-nourished, in no acute distress; alert, appropriate and cooperative throughout examination.  Head: Normocephalic, atraumatic.  Eyes: conjunctivae/corneas clear. PERRL, EOM's intact.   Ears: TM nonerythematous, not bulging, good light reflex bilaterally.  Right EAC - soft cerumen buildup, no hardened cerumen. No erythema, inflammation of EAC, pinna, or external ear. Left EAC - small, subcentimeter healed hypopigmented lesion, painful lesion at exterior of EAC without scaling, erythema, blistering. No erythema, inflammation of EAC, pinna, or external ear.  Nose: Mucous membranes moist, not inflammed, nonerythematous.  Throat: Oropharynx nonerythematous, no exudate appreciated.   Lungs:  Normal respiratory effort. Clear to auscultation BL without crackles or wheezes.  Heart: RRR. S1 and S2 normal without gallop, rubs. No murmur.  Abdomen:  BS normoactive. Soft, Nondistended, non-tender.  No masses or organomegaly.  Extremities: No pretibial edema.    Assessment/ Plan:   The patient's case and plan of care was discussed with attending physician, Dr. Doneen Poisson.

## 2012-05-07 NOTE — Assessment & Plan Note (Signed)
Assessment: Likely secondary to acne-type lesion in her ear, that has been unable to heal due to patient repeatedly picking at the lesion. There is no erythema, scaling to indicate eczematous lesion. No erythema, warmth, swelling of external ear or EAC to suggest cellulitis. No erythema, retraction, or bulging of TM to suggest OM.   Plan:      Keep ears clean and dry.  Instructed pt to avoid manual manipulation of the lesion as able. The patient was informed of the red flag symptoms that should prompt her immediate return for reevaluation - such as, but not limited to the following: Fevers, chills, worsening redness, swelling, pain of her ear. She verbally expresses understanding of the information provided.

## 2012-05-10 NOTE — Progress Notes (Signed)
Case discussed with Dr. Kalia-Reynolds at the time of the visit.  We reviewed the resident's history and exam and pertinent patient test results.  I agree with the assessment, diagnosis and plan of care documented in the resident's note. 

## 2012-05-12 ENCOUNTER — Telehealth: Payer: Self-pay | Admitting: *Deleted

## 2012-05-12 DIAGNOSIS — M25569 Pain in unspecified knee: Secondary | ICD-10-CM

## 2012-05-12 DIAGNOSIS — I10 Essential (primary) hypertension: Secondary | ICD-10-CM

## 2012-05-12 NOTE — Telephone Encounter (Addendum)
Pt calls and states she is having cramps in limbs and needs some K+. States it always happens when she comes off her K+. Has a mid may appt w/ pcp. Please advise, do you want pt to come in for labs?

## 2012-05-13 NOTE — Telephone Encounter (Signed)
Yes please have patient come in for labs to be drawn. Will put order for BMP.  Thanks!

## 2012-05-15 ENCOUNTER — Other Ambulatory Visit (INDEPENDENT_AMBULATORY_CARE_PROVIDER_SITE_OTHER): Payer: Self-pay

## 2012-05-15 DIAGNOSIS — I10 Essential (primary) hypertension: Secondary | ICD-10-CM

## 2012-05-15 LAB — BASIC METABOLIC PANEL WITH GFR
CO2: 22 mEq/L (ref 19–32)
Calcium: 9.8 mg/dL (ref 8.4–10.5)
Creat: 0.76 mg/dL (ref 0.50–1.10)
GFR, Est African American: >89 mL/min

## 2012-05-29 ENCOUNTER — Ambulatory Visit (INDEPENDENT_AMBULATORY_CARE_PROVIDER_SITE_OTHER): Payer: Self-pay | Admitting: Internal Medicine

## 2012-05-29 ENCOUNTER — Encounter: Payer: Self-pay | Admitting: Internal Medicine

## 2012-05-29 VITALS — BP 142/87 | HR 63 | Temp 98.7°F | Wt 200.6 lb

## 2012-05-29 DIAGNOSIS — K5909 Other constipation: Secondary | ICD-10-CM

## 2012-05-29 DIAGNOSIS — G5600 Carpal tunnel syndrome, unspecified upper limb: Secondary | ICD-10-CM

## 2012-05-29 DIAGNOSIS — L259 Unspecified contact dermatitis, unspecified cause: Secondary | ICD-10-CM

## 2012-05-29 DIAGNOSIS — G5603 Carpal tunnel syndrome, bilateral upper limbs: Secondary | ICD-10-CM

## 2012-05-29 DIAGNOSIS — L309 Dermatitis, unspecified: Secondary | ICD-10-CM

## 2012-05-29 DIAGNOSIS — I10 Essential (primary) hypertension: Secondary | ICD-10-CM

## 2012-05-29 DIAGNOSIS — A6000 Herpesviral infection of urogenital system, unspecified: Secondary | ICD-10-CM

## 2012-05-29 MED ORDER — DOCUSATE SODIUM 100 MG PO CAPS: 100.0000 mg | ORAL_CAPSULE | Freq: Every day | ORAL | Status: DC

## 2012-05-29 MED ORDER — BISOPROLOL-HYDROCHLOROTHIAZIDE 5-6.25 MG PO TABS: ORAL_TABLET | ORAL | Status: DC

## 2012-05-29 MED ORDER — DESONIDE 0.05 % EX OINT: 1.0000 "application " | TOPICAL_OINTMENT | Freq: Two times a day (BID) | CUTANEOUS | Status: AC | PRN

## 2012-05-29 MED ORDER — FLUOCINOLONE ACETONIDE 0.01 % EX OIL: TOPICAL_OIL | Freq: Three times a day (TID) | CUTANEOUS | Status: DC

## 2012-05-29 MED ORDER — IBUPROFEN 800 MG PO TABS: 800.0000 mg | ORAL_TABLET | Freq: Three times a day (TID) | ORAL | Status: AC | PRN

## 2012-05-29 MED ORDER — ACYCLOVIR 200 MG PO CAPS: 200.0000 mg | ORAL_CAPSULE | Freq: Two times a day (BID) | ORAL | Status: AC

## 2012-05-29 NOTE — Progress Notes (Signed)
Patient ID: Carrie Franco, female   DOB: 1965-03-19, 47 y.o.   MRN: [id number] Internal Medicine Clinic Visit    HPI:  Carrie Franco is a very pleasant 47 y.o. year old female with a history of chronic wrist pain, genital herpes, hypertension, who presents for followup and medication refills.  Patient states that she has been doing very well. She does still have some chronic bilateral wrist pain. She states that she uses a splint at night and when she takes ibuprofen the pain goes away completely for a day or 2. She has not taken ibuprofen that often, maybe once or twice a month, but she asks for refill today.  Patient would also like refills on her blood pressure medicine, stool softener, and eczema medications. She also states that she occasionally gets genital herpes outbreaks about twice per year. She takes zovirax on and off, mostly when she has an [place].  Denies any fever, chills, shortness of breath, cough, chest pain, vaginal discharge, dysuria.  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 She does not smoke, drink alcohol, or use any drugs currently.   Past Medical History  Diagnosis Date  . High risk sexual behavior   . Hypertension   . Bacterial vaginitis     recurrent  . Recurrent UTI   . Genital herpes   . Recurrent boils   . External hemorrhoids with complication   . Allergic rhinitis   . Chronic constipation   . History of cocaine abuse 1992  . History of tobacco abuse 1999  . Bilateral carpal tunnel syndrome   . Acne   . Hyperlipidemia   . Asthma     Past Surgical History  Procedure Laterality Date  . Tummy tuck  03/2010     ROS:  A complete review of systems was otherwise negative, except as noted in the HPI.  Allergies: Orange fruit and Penicillins  Medications: Current Outpatient Prescriptions  Medication Sig Dispense Refill  . albuterol (PROVENTIL HFA;VENTOLIN HFA) 108 (90 BASE) MCG/ACT inhaler Inhale 2 puffs into the lungs every 4 (four)  hours as needed. For shortness of breath      . bisoprolol-hydrochlorothiazide (ZIAC) 5-6.25 MG per tablet TAKE ONE TABLET BY MOUTH ONCE DAILY  30 tablet  5  . docusate sodium (COLACE) 100 MG capsule Take 1 capsule (100 mg total) by mouth daily. For constipation  30 capsule  3  . ibuprofen (ADVIL,MOTRIN) 800 MG tablet Take 1 tablet (800 mg total) by mouth every 8 (eight) hours as needed for pain.  30 tablet  0  . loratadine (CLARITIN) 10 MG tablet Take 10 mg by mouth daily as needed. For allergies       No current facility-administered medications for this visit.    History   Social History  . Marital Status: Single    Spouse Name: N/A    Number of Children: N/A  . Years of Education: N/A   Occupational History  . Not on file.   Social History Main Topics  . Smoking status: Former Smoker    Types: Cigarettes  . Smokeless tobacco: Not on file     Comment: quit 24yrs ago  . Alcohol Use: No  . Drug Use: No  . Sexually Active: Yes   Other Topics Concern  . Not on file   Social History Narrative    The patient works at a daycare center, she completed high school, is single    family history includes Cancer in her  mother and Diabetes in her other.  Physical Exam Blood pressure 142/87, pulse 63, temperature 98.7 F (37.1 C), temperature source Oral, weight 200 lb 9.6 oz (90.992 kg), SpO2 100.00%. General:  No acute distress, alert and oriented x 3, well-appearing AAF HEENT:  PERRL, EOMI, no lymphadenopathy, moist mucous membranes Cardiovascular:  Regular rate and rhythm, no murmurs, rubs or gallops Respiratory:  Clear to auscultation bilaterally, no wheezes, rales, or rhonchi Abdomen:  Soft, nondistended, nontender, positive bowel sounds Extremities:  Warm and well-perfused, no clubbing, cyanosis, or edema.  Skin: Warm, dry, no rashes, tattoos noted Neuro: Not anxious appearing, no depressed mood, normal affect  Labs: Lab Results  Component Value Date   CREATININE 0.76  05/15/2012   BUN 12 05/15/2012   NA 141 05/15/2012   K 3.7 05/15/2012   CL 103 05/15/2012   CO2 22 05/15/2012   Lab Results  Component Value Date   WBC 5.1 03/11/2012   HGB 13.0 03/11/2012   HCT 38.8 03/11/2012   MCV 88.4 03/11/2012   PLT 232 03/11/2012      Assessment and Plan:    FOLLOWUP: Carrie Franco will follow back up in our clinic in approximately 2-3 months. Carrie Franco knows to call out clinic in the meantime with any questions or new issues.

## 2012-05-29 NOTE — Patient Instructions (Signed)
Please return to clinic in 3 months for blood pressure check

## 2012-05-29 NOTE — Assessment & Plan Note (Signed)
Refill Colace.

## 2012-05-29 NOTE — Assessment & Plan Note (Addendum)
Chronic, stable, mild eczema  -Refilled topical medicines

## 2012-05-29 NOTE — Assessment & Plan Note (Signed)
BP Readings from Last 3 Encounters:  05/29/12 142/87  05/07/12 132/83  03/11/12 138/90    Lab Results  Component Value Date   NA 141 05/15/2012   K 3.7 05/15/2012   CREATININE 0.76 05/15/2012    Assessment: Blood pressure control: mildly elevated Progress toward BP goal:  unchanged Comments:   Plan: Medications:  continue current medications Educational resources provided:   Self management tools provided:   Other plans:   Recheck BP in 2 months

## 2012-05-29 NOTE — Assessment & Plan Note (Addendum)
1-2 outbreaks per year, patient would like to continue oral suppressive therapy. She states that her outbreaks are minor and she may consider stopping daily suppressive therapy in the future.  -Refills for Zovirax given

## 2012-05-29 NOTE — Assessment & Plan Note (Signed)
Patient states that ibuprofen helps with her chronic wrist pain. She does continue to wear splints at night which I told her she can continue. 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  -Refilled ibuprofen

## 2012-05-30 ENCOUNTER — Telehealth: Payer: Self-pay | Admitting: *Deleted

## 2012-05-30 MED ORDER — TRIAMCINOLONE ACETONIDE 0.1 % EX CREA: TOPICAL_CREAM | Freq: Two times a day (BID) | CUTANEOUS | Status: DC

## 2012-05-30 NOTE — Telephone Encounter (Signed)
Pt calls and states the cream you prescribed yesterday at appt is $193.00, she states she cannot afford this and would like triamcinolone 1% as it has seemed to work well in the past, you may call her at the ph# on the chart, [phone]

## 2012-06-02 NOTE — Progress Notes (Signed)
INTERNAL MEDICINE TEACHING ATTENDING ADDENDUM: I discussed this case with Dr. Kesty soon after the patient visit. I have read the documentation and I agree with the plan of care. Please see the resident note for details of management. 

## 2012-06-30 ENCOUNTER — Encounter: Payer: Self-pay | Admitting: Internal Medicine

## 2012-06-30 NOTE — Assessment & Plan Note (Signed)
Her leg cramps are not correlated with activity and are sporadic throughout the day.  As the start of the workup we will check a Bmet today to check for electrolyte abnormalities.  If they are normal we will have to investigate other possible causes.
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
## 2012-07-16 ENCOUNTER — Ambulatory Visit (INDEPENDENT_AMBULATORY_CARE_PROVIDER_SITE_OTHER): Payer: Self-pay | Admitting: Internal Medicine

## 2012-07-16 ENCOUNTER — Encounter: Payer: Self-pay | Admitting: Internal Medicine

## 2012-07-16 ENCOUNTER — Ambulatory Visit: Payer: Self-pay | Admitting: Internal Medicine

## 2012-07-16 VITALS — BP 129/83 | HR 67 | Temp 97.2°F | Ht 66.0 in | Wt 196.1 lb

## 2012-07-16 DIAGNOSIS — M79609 Pain in unspecified limb: Secondary | ICD-10-CM

## 2012-07-16 DIAGNOSIS — M79604 Pain in right leg: Secondary | ICD-10-CM

## 2012-07-16 DIAGNOSIS — N926 Irregular menstruation, unspecified: Secondary | ICD-10-CM

## 2012-07-16 DIAGNOSIS — G5600 Carpal tunnel syndrome, unspecified upper limb: Secondary | ICD-10-CM

## 2012-07-16 DIAGNOSIS — I10 Essential (primary) hypertension: Secondary | ICD-10-CM

## 2012-07-16 DIAGNOSIS — G5603 Carpal tunnel syndrome, bilateral upper limbs: Secondary | ICD-10-CM

## 2012-07-16 NOTE — Patient Instructions (Addendum)
General Instructions: Please rest your right leg for AT LEAST ONE WEEK, apply ice to the area up to 4 times a day and you can continue with your ibuprofen as needed and prescribed for pain relief. Start light stretches after 1 week rest.    If you notice worsening of swelling or pain, call the clinic and let us know.  Follow up will be made for 1 week but if improved, can cancel.      Treatment Goals:  Goals (1 Years of Data) as of 07/16/12         As of Today 05/29/12 05/07/12 03/11/12 03/11/12     Blood Pressure    . Blood Pressure < 140/90  129/83 142/87 132/83 138/90 135/93      Progress Toward Treatment Goals:  Treatment Goal 07/16/2012  Blood pressure at goal    Self Care Goals & Plans:  Self Care Goal 05/29/2012  Manage my medications -  Eat healthy foods drink diet soda or water instead of juice or soda; eat more vegetables  Be physically active -   Care Management & Community Referrals:    Medial Head Gastrocnemius Tear (Tennis Leg)  with Rehab Medial head gastrocnemius tear, also called tennis leg, is a tear (strain) in a muscle or tendon of the inner portion (medial head) of one of the calf muscles (gastrocnemius). The inner portion of the calf muscle attaches to the thigh bone (femur) and is responsible for bending the knee and straightening the foot (standing on "tippy toes"). Strains are classified into three categories. Grade 1 strains cause pain, but the tendon is not lengthened. Grade 2 strains include a lengthened ligament, due to the ligament being stretched or partially ruptured. With grade 2 strains there is still function, although function may be decreased. Grade 3 strains involve a complete tear of the tendon or muscle, and function is usually impaired. SYMPTOMS   Sudden "pop" or tear felt at the time of injury.  Pain, tenderness, swelling, warmth, or redness over the middle inner calf.  Pain and weakness with ankle motion, especially flexing the ankle against  resistance, as well as pain with lifting the foot up (extending the ankle).  Bruising (contusion) of the calf, heel and, sometimes, foot within 48 hours of injury.  Muscle spasm in the calf. CAUSES  Muscle and ligament strains occur when a force is placed on the muscle or ligament that is greater than it can handle. Common causes of injury include:  Direct hit (trauma) to the calf.  Sudden forceful pushing off or landing on the foot (jumping, landing, serving a tennis ball, lunging). RISK INCREASES WITH:  Sports that require sudden, explosive calf muscle contraction, such as those involving jumping (basketball), hill running, quick starts (running), or lunging (racquetball, tennis).  Contact sports (football, soccer, hockey).  Poor strength and flexibility.  Previous lower limb injury. PREVENTION  Warm up and stretch properly before activity.  Allow for adequate recovery between workouts.  Maintain physical fitness:  Strength, flexibility, and endurance.  Cardiovascular fitness.  Learn and use proper exercise technique.  Complete rehabilitation after lower limb injury, before returning to competition or practice. PROGNOSIS  If treated properly, tennis leg usually heals within 6 weeks of non-surgical treatment.  RELATED COMPLICATIONS   Longer healing time, if not properly treated or if not given enough time to heal.  Recurring symptoms and injury, if activity is resumed too soon, with overuse, with a direct blow, or with poor technique.  If untreated,  may progress to a complete tear (rare) or other injury, due to limping and favoring of the injured leg.  Persistent limping, due to scarring and shortening of the calf muscles, as a result of inadequate rehabilitation.  Prolonged disability. TREATMENT  Treatment first involves the use of ice and medication to help reduce pain and inflammation. The use of strengthening and stretching exercises may help reduce pain with  activity. These exercises may be performed at home or with a therapist. For severe injuries, referral to a therapist may be needed for further evaluation and treatment. Your caregiver may advise that you wear a brace to help healing. Sometimes, crutches are needed until you can walk without limping. Rarely, surgery is needed.  MEDICATION   If pain medicine is needed, nonsteroidal anti-inflammatory medicines (aspirin and ibuprofen), or other minor pain relievers (acetaminophen), are often advised.  Do not take pain medicine for 7 days before surgery.  Prescription pain relievers may be given, if your caregiver thinks they are needed. Use only as directed and only as much as you need. HEAT AND COLD  Cold treatment (icing) should be applied for 10 to 15 minutes every 2 to 3 hours for inflammation and pain, and immediately after activity that aggravates your symptoms. Use ice packs or an ice massage.  Heat treatment may be used before performing stretching and strengthening activities prescribed by your caregiver, physical therapist, or athletic trainer. Use a heat pack or a warm water soak. SEEK MEDICAL CARE IF:   Symptoms get worse or do not improve in 2 weeks, despite treatment.  Numbness or tingling develops.  New, unexplained symptoms develop. (Drugs used in treatment may produce side effects.) EXERCISES  RANGE OF MOTION (ROM) AND STRETCHING EXERCISES - Medial Head Gastrocnemius Tear (Tennis Leg) These exercises may help you when beginning to rehabilitate your injury. Your symptoms may resolve with or without further involvement from your physician, physical therapist or athletic trainer. While completing these exercises, remember:   Restoring tissue flexibility helps normal motion to return to the joints. This allows healthier, less painful movement and activity.  An effective stretch should be held for at least 30 seconds.  A stretch should never be painful. You should only feel a  gentle lengthening or release in the stretched tissue. STRETCH - Gastrocsoleus  Sit with your right / left leg extended. Holding onto both ends of a belt or towel, loop it around the ball of your foot.  Keeping your right / left ankle and foot relaxed and your knee straight, pull your foot and ankle toward you using the belt.  You should feel a gentle stretch behind your calf or knee. Hold this position for __________ seconds. Repeat __________ times. Complete this stretch __________ times per day.  RANGE OF MOTION - Ankle Dorsiflexion, Active Assisted   Remove your shoes and sit on a chair, preferably not on a carpeted surface.  Place your right / left foot directly under the knee. Extend your opposite leg for support.  Keeping your heel down, slide your right / left foot back toward the chair, until you feel a stretch at your ankle or calf. If you do not feel a stretch, slide your bottom forward to the edge of the chair, while still keeping your heel down.  Hold this stretch for __________ seconds. Repeat __________ times. Complete this stretch __________ times per day.  STRETCH  Gastroc, Standing   Place your hands on a wall.  Extend your right / left leg behind  you, keeping the front knee somewhat bent.  Slightly point your toes inward on your back foot.  Keeping your right / left heel on the floor and your knee straight, shift your weight toward the wall, not allowing your back to arch.  You should feel a gentle stretch in the right / left calf. Hold this position for __________ seconds. Repeat __________ times. Complete this stretch __________ times per day. STRETCH  Soleus, Standing   Place your hands on a wall.  Extend your right / left leg behind you, keeping the other knee somewhat bent.  Slightly point your toes inward on your back foot.  Keep your right / left heel on the floor, bend your back knee, and slightly shift your weight over the back leg so that you feel a  gentle stretch deep in your back calf.  Hold this position for __________ seconds. Repeat __________ times. Complete this stretch __________ times per day. STRETCH  Gastrocsoleus, Standing Note: This exercise can place a lot of stress on your foot and ankle. Please complete this exercise only if specifically instructed by your caregiver.   Place the ball of your right / left foot on a step, keeping your other foot firmly on the same step.  Hold on to the wall or a rail for balance.  Slowly lift your other foot, allowing your body weight to press your heel down over the edge of the step.  You should feel a stretch in your right / left calf.  Hold this position for __________ seconds.  Repeat this exercise with a slight bend in your right / left knee. Repeat __________ times. Complete this stretch __________ times per day.  STRENGTHENING EXERCISES - Medial Head Gastrocnemius Tear (Tennis Leg) These exercises may help you when beginning to rehabilitate your injury. They may resolve your symptoms with or without further involvement from your physician, physical therapist or athletic trainer. While completing these exercises, remember:   Muscles can gain both the endurance and the strength needed for everyday activities through controlled exercises.  Complete these exercises as instructed by your physician, physical therapist or athletic trainer. Increase the resistance and repetitions only as guided by your caregiver. STRENGTH - Plantar-flexors  Sit with your right / left leg extended. Holding onto both ends of a rubber exercise band or tubing, loop it around the ball of your foot. Keep a slight tension in the band.  Slowly push your toes away from you, pointing them downward.  Hold this position for __________ seconds. Return slowly, controlling the tension in the band. Repeat __________ times. Complete this exercise __________ times per day.  STRENGTH - Plantar-flexors  Stand with  your feet shoulder width apart. Steady yourself with a wall or table, using as little support as needed.  Keeping your weight evenly spread over the width of your feet, rise up on your toes.*  Hold this position for __________ seconds. Repeat __________ times. Complete this exercise __________ times per day.  *If this is too easy, shift your weight toward your right / left leg until you feel challenged. Ultimately, you may be asked to do this exercise while standing on your right / left foot only. STRENGTH  Plantar-flexors, Eccentric Note: This exercise can place a lot of stress on your foot and ankle. Please complete this exercise only if specifically instructed by your caregiver.   Place the balls of your feet on a step. With your hands, use only enough support from a wall or rail to  keep your balance.  Keep your knees straight and rise up on your toes.  Slowly shift your weight entirely to your right / left toes and pick up your opposite foot. Gently and with controlled movement, lower your weight through your right / left foot so that your heel drops below the level of the step. You will feel a slight stretch in the back of your right / left calf.  Use the healthy leg to help rise up onto the balls of both feet, then lower weight only onto the right / left leg again. Build up to 15 repetitions. Then progress to 3 sets of 15 repetitions.*  After completing the above exercise, complete the same exercise with a slight knee bend (about 30 degrees). Again, build up to 15 repetitions. Then progress to 3 sets of 15 repetitions.* Perform this exercise __________ times per day.  *When you easily complete 3 sets of 15, your physician, physical therapist or athletic trainer may advise you to add resistance, by wearing a backpack filled with additional weight. Document Released: 01/01/2005 Document Revised: 03/26/2011 Document Reviewed: 04/15/2008 [facility] Patient Information 2014 [place],  [place].

## 2012-07-17 DIAGNOSIS — M79651 Pain in right thigh: Secondary | ICD-10-CM | POA: Insufficient documentation

## 2012-07-17 DIAGNOSIS — M79604 Pain in right leg: Secondary | ICD-10-CM | POA: Insufficient documentation

## 2012-07-17 NOTE — Assessment & Plan Note (Signed)
Needs new wrist splints.  Gave name of company that we use in clinic since she liked those ones and will try outside facility.  -ibuprofen prn, has refills already at pharmacy

## 2012-07-17 NOTE — Assessment & Plan Note (Signed)
Likely secondary to gastrocnemius sprain or tear while exercising.  Recommend supportive therapy at this time.  -rest x1 week -ice to affected area -topical muscle rub lotions -return in one week if no improvement

## 2012-07-17 NOTE — Assessment & Plan Note (Signed)
BP Readings from Last 3 Encounters:  07/16/12 129/83  05/29/12 142/87  05/07/12 132/83   Lab Results  Component Value Date   NA 141 05/15/2012   K 3.7 05/15/2012   CREATININE 0.76 05/15/2012    Assessment: Blood pressure control: controlled Progress toward BP goal:  at goal  Plan: Medications:  continue current medications Ziac

## 2012-07-17 NOTE — Progress Notes (Signed)
Subjective:   Patient ID: Carrie Franco female   DOB: 13-May-1965 47 y.o.   MRN: [id number]  HPI: Carrie Franco is a 47 y.o. African American female with PMH of general herpes, asthma, and carpal tunnel syndrome presenting to clinic today for acute right lower leg pain.  She claims she was doing squats 3 or 4 days ago while exercising and when she came up from a squat she had severe right posterior leg pain in her calf.  She has had similar pain before and claims she has pulled a muscle but the pain has not improved much over the past few days.  She has been trying to rest the leg as much as possible and is still able to walk but does have discomfort due to pain at times with walking.  She denies hearing a "pop" or any other trauma to the area along with no visible swelling, redness or warmth.  She has tried otc muscle lotion with minimal relief.  She is asking if a muscle relaxant may help?  She also endorses needing a refill for her ibuprofen for her carpal tunnel and that she lost the wrist splints that we gave in her clinic last time that fit perfectly and helped her tremendously.  In the interim she has purchased new ones from [facility] but they are not as good.  I informed her that she does have refills already called in for the ibuprofen and that we unfortunately do not have any more splints for her wrist of her size but i did give her the name of the company for her to try to get from outside facility.   Past Medical History  Diagnosis Date  . High risk sexual behavior   . Hypertension   . Bacterial vaginitis     recurrent  . Recurrent UTI   . Genital herpes   . Recurrent boils   . External hemorrhoids with complication   . Allergic rhinitis   . Chronic constipation   . History of cocaine abuse 1992  . History of tobacco abuse 1999  . Bilateral carpal tunnel syndrome   . Acne   . Hyperlipidemia   . Asthma    Current Outpatient Prescriptions  Medication Sig Dispense Refill  .  bisoprolol-hydrochlorothiazide (ZIAC) 5-6.25 MG per tablet TAKE ONE TABLET BY MOUTH ONCE DAILY  30 tablet  11  . desonide (DESOWEN) 0.05 % ointment Apply 1 application topically 2 (two) times daily as needed. To affected area for eczema  60 g  11  . docusate sodium (COLACE) 100 MG capsule Take 1 capsule (100 mg total) by mouth daily. For constipation  30 capsule  11  . fluocinolone (DERMA-SMOOTHE/FS SCALP) 0.01 % external oil Apply topically 3 (three) times daily.  120 mL  11  . ibuprofen (ADVIL,MOTRIN) 800 MG tablet Take 800 mg by mouth every 8 (eight) hours as needed for pain.      Marland Kitchen loratadine (CLARITIN) 10 MG tablet Take 10 mg by mouth daily as needed. For allergies      . triamcinolone cream (KENALOG) 0.1 % Apply topically 2 (two) times daily.  45 g  11  . albuterol (PROVENTIL HFA;VENTOLIN HFA) 108 (90 BASE) MCG/ACT inhaler Inhale 2 puffs into the lungs every 4 (four) hours as needed. For shortness of breath       No current facility-administered medications for this visit.   Family History  Problem Relation Age of Onset  . Cancer Mother   . Diabetes  Other    History   Social History  . Marital Status: Single    Spouse Name: N/A    Number of Children: N/A  . Years of Education: N/A   Social History Main Topics  . Smoking status: Former Smoker    Types: Cigarettes  . Smokeless tobacco: None     Comment: quit 20yrs ago  . Alcohol Use: No  . Drug Use: No  . Sexually Active: Yes    Birth Control/ Protection: None   Other Topics Concern  . None   Social History Narrative    The patient works at a daycare center, she completed high school, is single   Review of Systems:  Constitutional:  Denies fever, chills, diaphoresis, appetite change and fatigue.   HEENT:  Denies congestion, sore throat, rhinorrhea, sneezing, mouth sores, trouble swallowing, neck pain   Respiratory:  Denies SOB, DOE, cough, and wheezing.   Cardiovascular:  Denies palpitations and leg swelling.     Gastrointestinal:  Denies nausea, vomiting, abdominal pain, diarrhea, constipation, blood in stool and abdominal distention.   Genitourinary:  Denies dysuria, urgency, frequency, hematuria, flank pain and difficulty urinating.   Musculoskeletal:  R leg pain and R wrist pain.  Denies myalgias, back pain, joint swelling, arthralgias and gait problem.   Skin:  Denies pallor, rash and wound.   Neurological:  Denies dizziness, seizures, syncope, weakness, light-headedness, numbness and headaches.    Objective:  Physical Exam: Filed Vitals:   07/16/12 0949  BP: 129/83  Pulse: 67  Temp: 97.2 F (36.2 C)  TempSrc: Oral  Height: 5\' 6"  (1.676 m)  Weight: 196 lb 1.6 oz (88.95 kg)  SpO2: 99%   Vitals reviewed. General: sitting in chair, NAD HEENT: PERRL, EOMI, no scleral icterus Cardiac: RRR, no rubs, murmurs or gallops Pulm: clear to auscultation bilaterally, no wheezes, rales, or rhonchi Abd: soft, nontender, nondistended, BS present Ext: warm and well perfused, no pedal edema, +2dp b/l, tenderness to deep palpation posterior calf.  R calf 15.5 L calf 15, no warmth to touch, visible erythema or swelling.  B/l wrist pain.  Neuro: alert and oriented X3, cranial nerves II-XII grossly intact, strength and sensation to light touch equal in bilateral upper and lower extremities  Assessment & Plan:  Discussed with Dr. Criselda Peaches -supportive measures for now: refilled ibuprofen, rest, ice

## 2012-08-05 ENCOUNTER — Encounter: Payer: Self-pay | Admitting: Internal Medicine

## 2012-08-05 ENCOUNTER — Ambulatory Visit (INDEPENDENT_AMBULATORY_CARE_PROVIDER_SITE_OTHER): Payer: No Typology Code available for payment source | Admitting: Internal Medicine

## 2012-08-05 VITALS — BP 140/88 | HR 78 | Temp 96.7°F | Ht 62.0 in | Wt 199.4 lb

## 2012-08-05 DIAGNOSIS — I10 Essential (primary) hypertension: Secondary | ICD-10-CM

## 2012-08-05 DIAGNOSIS — E66811 Obesity, class 1: Secondary | ICD-10-CM | POA: Insufficient documentation

## 2012-08-05 DIAGNOSIS — Z0000 Encounter for general adult medical examination without abnormal findings: Secondary | ICD-10-CM

## 2012-08-05 DIAGNOSIS — J3489 Other specified disorders of nose and nasal sinuses: Secondary | ICD-10-CM

## 2012-08-05 DIAGNOSIS — E669 Obesity, unspecified: Secondary | ICD-10-CM

## 2012-08-05 DIAGNOSIS — H938X3 Other specified disorders of ear, bilateral: Secondary | ICD-10-CM | POA: Insufficient documentation

## 2012-08-05 MED ORDER — NEOMYCIN-POLYMYXIN-HC 1 % OT SOLN: 3.0000 [drp] | Freq: Four times a day (QID) | OTIC | Status: DC

## 2012-08-05 MED ORDER — CARBAMIDE PEROXIDE 6.5 % OT SOLN: 5.0000 [drp] | Freq: Two times a day (BID) | OTIC | Status: DC

## 2012-08-05 NOTE — Assessment & Plan Note (Signed)
Patient going to weight loss specialist who wants copy of CMET, tsh Gave patient letter with copy of CMET but will check tsh today

## 2012-08-05 NOTE — Assessment & Plan Note (Signed)
Ears right >left have increased cerumen which could be cause.  No infectious etiology at this time otitis externa or media Will RX Debrox drops to be continued in addition to polymyxin otic drops, warm compress, drink plenty of water, try OTC Benadryl, steam inhalations  Return if symptoms not improving after 1-2 weeks.

## 2012-08-05 NOTE — Assessment & Plan Note (Signed)
BP Readings from Last 3 Encounters:  08/05/12 140/88  07/16/12 129/83  05/29/12 142/87    Lab Results  Component Value Date   NA 141 05/15/2012   K 3.7 05/15/2012   CREATININE 0.76 05/15/2012    Assessment: Blood pressure control: controlled Progress toward BP goal:  at goal Comments: none  Plan: Medications:  continue current medications Educational resources provided: brochure;handout;video Self management tools provided:  (HTN handout) Other plans: none

## 2012-08-05 NOTE — Patient Instructions (Addendum)
General Instructions: Try warm compresses to your ears  Try Debrox and antibiotic ear drops as directed  Drink more water Try benadryl  Try steam inhalation   All these may help with your ear fullness if not return to clinic in 1-2 weeks otherwise 9 or 10/2012 for pap smear and mammogram referral.    Treatment Goals:  Goals (1 Years of Data) as of 08/05/12         As of Today 07/16/12 05/29/12 05/07/12 03/11/12     Blood Pressure    . Blood Pressure < 140/90  140/88 129/83 142/87 132/83 138/90      Progress Toward Treatment Goals:  Treatment Goal 08/05/2012  Blood pressure at goal    Self Care Goals & Plans:  Self Care Goal 08/05/2012  Manage my medications take my medicines as prescribed; bring my medications to every visit; refill my medications on time; follow the sick day instructions if I am sick  Monitor my health keep track of my blood pressure; keep track of my weight  Eat healthy foods eat more vegetables; eat fruit for snacks and desserts; eat smaller portions; eat baked foods instead of fried foods; drink diet soda or water instead of juice or soda; eat foods that are low in salt  Be physically active take a walk every day; find an activity I enjoy  Meeting treatment goals maintain the current self-care plan       Care Management & Community Referrals:  Referral 08/05/2012  Referrals made for care management support none needed  Referrals made to community resources none        Hydrocortisone; Neomycin; Polymyxin B ear suspension What is this medicine? HYDROCORTISONE; NEOMYCIN; and POLYMYXIN B (hye droe KOR ti sone; nee oh MYE sin; pol i MIX in B) is used to treat ear infections. This medicine may be used for other purposes; ask your health care provider or pharmacist if you have questions. What should I tell my health care provider before I take this medicine? They need to know if you have any of these conditions: -any other active infections -chronic ear  infections or fluid in the ear -perforated ear drum -an unusual or allergic reaction to hydrocortisone, neomycin, polymyxin B, sulfites, other medicines, foods, dyes, or preservatives -pregnant or trying to get pregnant -breast-feeding How should I use this medicine? This medicine is only for use in the ears. Wash your hands with soap and water. Clean your ear of any fluid that can be easily removed. Do not insert any object or swab into the ear canal. Gently warm the bottle by holding it in the hand for 1 to 2 minutes. Lie down on your side with the infected ear up. Try not to touch the tip of the dropper to your ear, fingertips, or other surface. Shake the bottle immediately before using. Squeeze the bottle gently to put the prescribed number of drops in the ear canal. Stay in this position for 30 to 60 seconds to help the drops soak into the ear. Repeat the steps for the other ear if both ears are infected. Do not use your medicine more often than directed. Finish the full course of medicine prescribed by your doctor or health care professional even if you think your condition is better. Talk to your pediatrician regarding the use of this medicine in children. While this drug may be prescribed for selected conditions, precautions do apply. Overdosage: If you think you have taken too much of this medicine contact  a poison control center or emergency room at once. NOTE: This medicine is only for you. Do not share this medicine with others. What if I miss a dose? If you miss a dose, use it as soon as you can. If it is almost time for your next dose, use only that dose. Do not take double or extra doses. What may interact with this medicine? Interactions are not expected. Do not use other ear products without talking to your doctor or health care professional. This list may not describe all possible interactions. Give your health care provider a list of all the medicines, herbs, non-prescription drugs,  or dietary supplements you use. Also tell them if you smoke, drink alcohol, or use illegal drugs. Some items may interact with your medicine. What should I watch for while using this medicine? Tell your doctor or health care professional if your ear infection does not get better in a few days. Do not use longer than 10 days unless instructed by your doctor or health care professional. If rash or allergic reaction occurs, stop the product immediately and contact your physician. It is important that you keep the infected ear(s) clean and dry. When bathing, try not to get the infected ear(s) wet. Do not go swimming unless your doctor or health care professional has told you otherwise. To prevent the spread of infection, do not share ear products, or share towels and washcloths with anyone else. What side effects may I notice from receiving this medicine? Side effects that you should report to your doctor or health care professional as soon as possible: -rash -red, itchy, dry scaly skin at the affected site -worsening ear pain Side effects that usually do not require medical attention (report to your doctor or health care professional if they continue or are bothersome): -abnormal sensation in the ear -burning or stinging while putting the drops in the ear This list may not describe all possible side effects. Call your doctor for medical advice about side effects. You may report side effects to FDA at 1-800-FDA-1088. Where should I keep my medicine? Keep out of the reach of children. Store at room temperature between 15 and 25 degrees C (59 and 77 degrees F). Do not freeze. Throw away any unused medicine after the expiration date. NOTE: This sheet is a summary. It may not cover all possible information. If you have questions about this medicine, talk to your doctor, pharmacist, or health care provider.  2013, [employer]vier/Gold Standard. (05/16/2007 3:49:00 PM)  Carbamide Peroxide ear solution What is  this medicine? CARBAMIDE PEROXIDE (CAR bah mide per OX ide) is used to soften and help remove ear wax. This medicine may be used for other purposes; ask your health care provider or pharmacist if you have questions. What should I tell my health care provider before I take this medicine? They need to know if you have any of these conditions: -dizziness -ear discharge -ear pain, irritation or rash -infection -perforated eardrum (hole in eardrum) -an unusual or allergic reaction to carbamide peroxide, glycerin, hydrogen peroxide, other medicines, foods, dyes, or preservatives -pregnant or trying to get pregnant -breast-feeding How should I use this medicine? This medicine is only for use in the outer ear canal. Follow the directions carefully. Wash hands before and after use. The solution may be warmed by holding the bottle in the hand for 1 to 2 minutes. Lie with the affected ear facing upward. Place the proper number of drops into the ear canal. After the drops  are instilled, remain lying with the affected ear upward for 5 minutes to help the drops stay in the ear canal. A cotton ball may be gently inserted at the ear opening for no longer than 5 to 10 minutes to ensure retention. Repeat, if necessary, for the opposite ear. Do not touch the tip of the dropper to the ear, fingertips, or other surface. Do not rinse the dropper after use. Keep container tightly closed. Talk to your pediatrician regarding the use of this medicine in children. While this drug may be used in children as young as 12 years for selected conditions, precautions do apply. Overdosage: If you think you have taken too much of this medicine contact a poison control center or emergency room at once. NOTE: This medicine is only for you. Do not share this medicine with others. What if I miss a dose? If you miss a dose, use it as soon as you can. If it is almost time for your next dose, use only that dose. Do not use double or extra  doses. What may interact with this medicine? Interactions are not expected. Do not use any other ear products without asking your doctor or health care professional. This list may not describe all possible interactions. Give your health care provider a list of all the medicines, herbs, non-prescription drugs, or dietary supplements you use. Also tell them if you smoke, drink alcohol, or use illegal drugs. Some items may interact with your medicine. What should I watch for while using this medicine? This medicine is not for long-term use. Do not use for more than 4 days without checking with your health care professional. Contact your doctor or health care professional if your condition does not start to get better within a few days or if you notice burning, redness, itching or swelling. What side effects may I notice from receiving this medicine? Side effects that you should report to your doctor or health care professional as soon as possible: -allergic reactions like skin rash, itching or hives, swelling of the face, lips, or tongue -burning, itching, and redness -worsening ear pain -rash Side effects that usually do not require medical attention (report to your doctor or health care professional if they continue or are bothersome): -abnormal sensation while putting the drops in the ear -temporary reduction in hearing (but not complete loss of hearing) This list may not describe all possible side effects. Call your doctor for medical advice about side effects. You may report side effects to FDA at 1-800-FDA-1088. Where should I keep my medicine? Keep out of the reach of children. Store at room temperature between 15 and 30 degrees C (59 and 86 degrees F) in a tight, light-resistant container. Keep bottle away from excessive heat and direct sunlight. Throw away any unused medicine after the expiration date. NOTE: This sheet is a summary. It may not cover all possible information. If you have  questions about this medicine, talk to your doctor, pharmacist, or health care provider.  2013, Elsevier/Gold Standard. (04/15/2007 2:00:02 PM)   Hypertension As your heart beats, it forces blood through your arteries. This force is your blood pressure. If the pressure is too high, it is called hypertension (HTN) or high blood pressure. HTN is dangerous because you may have it and not know it. High blood pressure may mean that your heart has to work harder to pump blood. Your arteries may be narrow or stiff. The extra work puts you at risk for heart disease, stroke, and  other problems.  Blood pressure consists of two numbers, a higher number over a lower, 110/72, for example. It is stated as "110 over 72." The ideal is below 120 for the top number (systolic) and under 80 for the bottom (diastolic). Write down your blood pressure today. You should pay close attention to your blood pressure if you have certain conditions such as:  Heart failure.  Prior heart attack.  Diabetes  Chronic kidney disease.  Prior stroke.  Multiple risk factors for heart disease. To see if you have HTN, your blood pressure should be measured while you are seated with your arm held at the level of the heart. It should be measured at least twice. A one-time elevated blood pressure reading (especially in the Emergency Department) does not mean that you need treatment. There may be conditions in which the blood pressure is different between your right and left arms. It is important to see your caregiver soon for a recheck. Most people have essential hypertension which means that there is not a specific cause. This type of high blood pressure may be lowered by changing lifestyle factors such as:  Stress.  Smoking.  Lack of exercise.  Excessive weight.  Drug/tobacco/alcohol use.  Eating less salt. Most people do not have symptoms from high blood pressure until it has caused damage to the body. Effective treatment  can often prevent, delay or reduce that damage. TREATMENT  When a cause has been identified, treatment for high blood pressure is directed at the cause. There are a large number of medications to treat HTN. These fall into several categories, and your caregiver will help you select the medicines that are best for you. Medications may have side effects. You should review side effects with your caregiver. If your blood pressure stays high after you have made lifestyle changes or started on medicines,   Your medication(s) may need to be changed.  Other problems may need to be addressed.  Be certain you understand your prescriptions, and know how and when to take your medicine.  Be sure to follow up with your caregiver within the time frame advised (usually within two weeks) to have your blood pressure rechecked and to review your medications.  If you are taking more than one medicine to lower your blood pressure, make sure you know how and at what times they should be taken. Taking two medicines at the same time can result in blood pressure that is too low. SEEK IMMEDIATE MEDICAL CARE IF:  You develop a severe headache, blurred or changing vision, or confusion.  You have unusual weakness or numbness, or a faint feeling.  You have severe chest or abdominal pain, vomiting, or breathing problems. MAKE SURE YOU:   Understand these instructions.  Will watch your condition.  Will get help right away if you are not doing well or get worse. Document Released: 01/01/2005 Document Revised: 03/26/2011 Document Reviewed: 08/22/2007 [facility] Patient Information 2014 [place], [place].

## 2012-08-05 NOTE — Progress Notes (Signed)
  Subjective:    Patient ID: Carrie Franco, female    DOB: Aug 13, 1965, 47 y.o.   MRN: [id number]  HPI Comments: 47 y.o PMH HTN (BP 140/88), asthma, eczema, carpel tunnel and other.  She presents for sensation of ear fullness in both ears.  She feels like water is in both ears and when multiple voices are talking there is mumbling/echo. She has tried to make her ears pop with no relief.  She tried Debrox wax cleaner for the past 2-3 days w/o relief.  She has even taken cold medicine with no relief though she has not had a cough.  She denies ear pain.  She says it is hard to hear multiple voices talking at the same time and sounds like mumbling.  She had ringing in her ears about 1 month ago.  She denies feeling dizzy/lightheaded now but states around the 1st of July she had these symptoms and she also felt dizzy on Saturday temporarily.  She just came back from a cruise to [place] 1-2 months ago but did not get in the water.  She denies getting water in her ears in the shower.  She states that 3-4 weeks ago her boyfriend was sick.  She reports 3-4 months ago she also had one of her ears irrigated in the clinic due to increased wax.    She is seeing a weight loss physician who needs a copy of her labs CMET and tsh.  She does not have a recent tsh so will check that today.  CMET was done 02/2012.   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  She has been on 8 cruises.  ROS per HPI        Review of Systems  Constitutional: Negative for fever and chills.  HENT: Negative for ear pain, sore throat, rhinorrhea, sinus pressure and tinnitus.        Objective:   Physical Exam  Nursing note and vitals reviewed. Constitutional: Vital signs are normal. She appears well-developed and well-nourished. She is cooperative. No distress.  HENT:  Head: Normocephalic and atraumatic.  Right Ear: External ear normal. No drainage.   Left Ear: External ear normal. No drainage.  Nose: Nose normal. Right sinus exhibits no maxillary sinus tenderness and no frontal sinus tenderness. Left sinus exhibits no maxillary sinus tenderness and no frontal sinus tenderness.  Mouth/Throat: Oropharynx is clear and moist and mucous membranes are normal. No oropharyngeal exudate.  Increase cerumen right ear No ethmoid sinuses   Eyes: Conjunctivae are normal.  Neurological: She is alert.  Skin: She is not diaphoretic.          Assessment & Plan:  F/u 1-2 weeks if not better otherwise 9 or 10/2012

## 2012-08-05 NOTE — Assessment & Plan Note (Signed)
Patient due for pap smear and mammogram 9 or 10/2012

## 2012-08-06 ENCOUNTER — Encounter: Payer: Self-pay | Admitting: Internal Medicine

## 2012-08-07 ENCOUNTER — Ambulatory Visit (INDEPENDENT_AMBULATORY_CARE_PROVIDER_SITE_OTHER): Payer: No Typology Code available for payment source | Admitting: Internal Medicine

## 2012-08-07 ENCOUNTER — Encounter: Payer: Self-pay | Admitting: Internal Medicine

## 2012-08-07 VITALS — BP 149/94 | HR 75 | Temp 96.9°F | Ht 63.0 in | Wt 198.1 lb

## 2012-08-07 DIAGNOSIS — H6120 Impacted cerumen, unspecified ear: Secondary | ICD-10-CM

## 2012-08-07 DIAGNOSIS — I10 Essential (primary) hypertension: Secondary | ICD-10-CM

## 2012-08-07 DIAGNOSIS — H6121 Impacted cerumen, right ear: Secondary | ICD-10-CM

## 2012-08-07 HISTORY — DX: Impacted cerumen, unspecified ear: H61.20

## 2012-08-07 NOTE — Progress Notes (Signed)
  Subjective:    Patient ID: Carrie Franco, female    DOB: Jul 02, 1965, 47 y.o.   MRN: [id number]  HPI Comments: 47 y.o PMH HTN (BP ), asthma, eczema, carpel tunnel and other.  She presents follow up for ear problems. She was here 2-3 days ago with ear fullness/congestion.  She states last night she could hear a "shhhhhh" noise in her right hear and she has decreased hearing in the right ear.  She felt lightheaded and dizzy yesterday with movement.  She does not feel like the room is spinning currently. She felt off balance yesterday.  Denies nausea/vomiting, eye movement problems, ringing in her ears and pain.  She still has ear fullness in the right ear.   ROS per HPI       Review of Systems  Neurological: Negative for numbness.       Objective:   Physical Exam  Nursing note and vitals reviewed. Constitutional: She is oriented to person, place, and time. She appears well-developed and well-nourished. She is cooperative. No distress.  HENT:  Head: Normocephalic and atraumatic.  Right Ear: Decreased hearing is noted.  Left Ear: Hearing normal.  Cerumen increased with plug in right ear removed with irrigation. No pain  Min. Cerumen in left ear  Neurological: She is alert and oriented to person, place, and time. Gait normal.  Grossly neurologically intact   Skin: She is not diaphoretic.          Assessment & Plan:  F/u with PCP chronic medical conditions and health maintenance.

## 2012-08-07 NOTE — Patient Instructions (Addendum)
Follow up at your regular scheduled appointment with your Primary care doctor   Cerumen Plug A cerumen plug is having too much wax in your ear canal. The outer ear canal is lined with hairs and glands that secrete wax. This wax is called cerumen. This protects the ear canal. It also helps prevent material from entering the ear. Too much wax can cause a feeling of fullness in the ears, decreased hearing, ringing in the ears, or an earache. Sometimes your caregiver will remove a cerumen plug with an instrument called a curette. Or he/she may flush the ear canal with warm water from a syringe to remove the wax. You may simply be sent home to follow the home care instructions below for wax removal. Generally ear wax does not have to be removed unless it is causing a problem such as one of those listed above. When too much wax is causing a problem, the following are a few home remedies which can be used to help this problem. HOME CARE INSTRUCTIONS   Put a couple drops of glycerin, baby oil, or mineral oil in the ear a couple times of day. Do this every day for several days. After putting the drops in, you will need to lay with the affected ear pointing up for a couple minutes. This allows the drops to remain in the canal and run down to the area of wax blockage. This will soften the wax plug. It may also make your hearing worse as the wax softens and blocks the canal even more.  After a couple days, you may gently flush the ear canal with warm water from a syringe. Do this by pulling your ear up and back with your head tilted slightly forward and towards a pan to catch the water. This is most easily done with a helper. You can also accomplish the same thing by letting the shower beat into your ear canal to wash the wax out. Sometimes this will not be immediately successful. You will have to return to the first step of using the oil to further soften the wax. Then resume washing the ear canal out with a syringe  or shower.  Following removal of the wax, put ten to twenty drops of rubbing alcohol into the outer ears. This will dry the canal and prevent an infection.  Do not irrigate or wash out your ears if you have had a perforated ear drum or mastoid surgery. SEEK IMMEDIATE MEDICAL CARE IF:   You are unsuccessful with the above instructions for home care.  You develop ear pain or drainage from the ear. MAKE SURE YOU:   Understand these instructions.  Will watch your condition.  Will get help right away if you are not doing well or get worse. Document Released: 09/26/2000 Document Revised: 03/26/2011 Document Reviewed: 12/24/2007 [facility] Patient Information 2014 [place], [place].  Cerumen Impaction A cerumen impaction is when the wax in your ear forms a plug. This plug usually causes reduced hearing. Sometimes it also causes an earache or dizziness. Removing a cerumen impaction can be difficult and painful. The wax sticks to the ear canal. The canal is sensitive and bleeds easily. If you try to remove a heavy wax buildup with a cotton tipped swab, you may push it in further. Irrigation with water, suction, and small ear curettes may be used to clear out the wax. If the impaction is fixed to the skin in the ear canal, ear drops may be needed for a few days  to loosen the wax. People who build up a lot of wax frequently can use ear wax removal products available in your local drugstore. SEEK MEDICAL CARE IF:  You develop an earache, increased hearing loss, or marked dizziness. Document Released: 02/09/2004 Document Revised: 03/26/2011 Document Reviewed: 03/31/2009 [facility] Patient Information 2014 [place], [place].

## 2012-08-07 NOTE — Progress Notes (Signed)
Case discussed with Dr. McLean at the time of the visit.  We reviewed the resident's history and exam and pertinent patient test results.  I agree with the assessment, diagnosis, and plan of care documented in the resident's note.     

## 2012-08-07 NOTE — Assessment & Plan Note (Signed)
Right ear with cerumen impaction and plug. Hearing improved.  Irrigated with large plug removed and 2 small plugs Continue Debrox prn

## 2012-08-07 NOTE — Assessment & Plan Note (Signed)
BP Readings from Last 3 Encounters:  08/07/12 149/94  08/05/12 140/88  07/16/12 129/83    Lab Results  Component Value Date   NA 141 05/15/2012   K 3.7 05/15/2012   CREATININE 0.76 05/15/2012    Assessment: Blood pressure control: mildly elevated Progress toward BP goal:  unable to assess Comments: possibly mildly elevated due to patient being distressed about loss of hearing  Plan: Medications:  continue current medications Educational resources provided: brochure Self management tools provided: home blood pressure logbook Other plans: recheck at f/u

## 2012-08-11 NOTE — Progress Notes (Signed)
I saw and evaluated the patient.  I personally confirmed the key portions of the history and exam documented by Dr. McLean and I reviewed pertinent patient test results.  The assessment, diagnosis, and plan were formulated together and I agree with the documentation in the resident's note.    
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
## 2012-10-03 ENCOUNTER — Encounter: Payer: Self-pay | Admitting: Internal Medicine

## 2012-10-03 ENCOUNTER — Ambulatory Visit (INDEPENDENT_AMBULATORY_CARE_PROVIDER_SITE_OTHER): Payer: No Typology Code available for payment source | Admitting: Internal Medicine

## 2012-10-03 VITALS — BP 127/82 | HR 73 | Wt 191.7 lb

## 2012-10-03 DIAGNOSIS — M62838 Other muscle spasm: Secondary | ICD-10-CM

## 2012-10-03 DIAGNOSIS — M25519 Pain in unspecified shoulder: Secondary | ICD-10-CM

## 2012-10-03 DIAGNOSIS — E669 Obesity, unspecified: Secondary | ICD-10-CM

## 2012-10-03 DIAGNOSIS — M25511 Pain in right shoulder: Secondary | ICD-10-CM

## 2012-10-03 DIAGNOSIS — Z0000 Encounter for general adult medical examination without abnormal findings: Secondary | ICD-10-CM

## 2012-10-03 DIAGNOSIS — I10 Essential (primary) hypertension: Secondary | ICD-10-CM

## 2012-10-03 DIAGNOSIS — Z124 Encounter for screening for malignant neoplasm of cervix: Secondary | ICD-10-CM

## 2012-10-03 MED ORDER — CYCLOBENZAPRINE HCL 10 MG PO TABS: 10.0000 mg | ORAL_TABLET | Freq: Two times a day (BID) | ORAL | Status: DC | PRN

## 2012-10-03 NOTE — Patient Instructions (Addendum)
-  Will do pelvic exam today with pap smear and microbial testing  -Please take flexiril 10mg  BID as needed for muscle spasm  -Will check HbA1c today for diabetes screening -You are scheduled to have colonoscopy and mammogram in October  -Will see you back in 6 months  -Nice meeting you!

## 2012-10-03 NOTE — Progress Notes (Signed)
Patient ID: Carrie Franco, female   DOB: 02-29-1965, 47 y.o.   MRN: [id number]    Subjective:   Patient ID: Carrie Franco female   DOB: 01-23-1965 47 y.o.   MRN: [id number]  HPI: Carrie Franco is a 47 y.o. with pmh of HTN, asthma, allergic rhinitis, HL, eczema, carpal tunnel syndrome, genital herpes, UTIs, and vaginitis who presents for routine gynecological exam.   Patient reports that she no longer has had bacterial vaginosis since 2012 which she attributes to changing sexual partners. She reports he was not circumscribed and despite being treated continued to infect her. She denies urinary symptoms, cervical discharge, vaginal bleeding, irritation, pelvic pain, or dyspareunia. She is currently sexually active and reports using barrier protection (she has latex allergy).     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 She has been taking ibuprofen with no relief. She has had similar shoulder pain in the past that was relieved with flexiril.    She reports her asthma is well controlled with no recent exacerbations or need for corticosteroid or antibioitic use. She denies dyspnea, cough, chest tightness, or wheezing. She has not needed to use her inhaler recently. Her triggers are thought to include cigarette smoke.   She does not monitor her blood pressure at home but is compliant with her daily anti-hypertensive medication and does not report symptoms suggestive of high blood pressure.     Past Medical History  Diagnosis Date  . High risk sexual behavior   . Hypertension   . Bacterial vaginitis     recurrent  . Recurrent UTI   . Genital herpes   . Recurrent boils   . External hemorrhoids with complication   . Allergic rhinitis   . Chronic constipation   . History of cocaine abuse 1992  . History of tobacco abuse 1999  . Bilateral carpal  tunnel syndrome   . Acne   . Hyperlipidemia   . Asthma    Current Outpatient Prescriptions  Medication Sig Dispense Refill  . albuterol (PROVENTIL HFA;VENTOLIN HFA) 108 (90 BASE) MCG/ACT inhaler Inhale 2 puffs into the lungs every 4 (four) hours as needed. For shortness of breath      . bisoprolol-hydrochlorothiazide (ZIAC) 5-6.25 MG per tablet TAKE ONE TABLET BY MOUTH ONCE DAILY  30 tablet  11  . carbamide peroxide (DEBROX) 6.5 % otic solution Place 5 drops into both ears 2 (two) times daily.  30 mL  0  . cyclobenzaprine (FLEXERIL) 10 MG tablet Take 1 tablet (10 mg total) by mouth 2 (two) times daily as needed for muscle spasms.  20 tablet  0  . desonide (DESOWEN) 0.05 % ointment Apply 1 application topically 2 (two) times daily as needed. To affected area for eczema  60 g  11  . docusate sodium (COLACE) 100 MG capsule Take 1 capsule (100 mg total) by mouth daily. For constipation  30 capsule  11  . fluocinolone (DERMA-SMOOTHE/FS SCALP) 0.01 % external oil Apply topically 3 (three) times daily.  120 mL  11  . ibuprofen (ADVIL,MOTRIN) 800 MG tablet Take 800 mg by mouth every 8 (eight) hours as needed for pain.      Marland Kitchen loratadine (CLARITIN) 10 MG tablet Take 10 mg by mouth daily as needed. For allergies      . NEOMYCIN-POLYMYXIN-HC, OTIC, (CORTISPORIN) 1 % SOLN  Place 3 drops into both ears every 6 (six) hours.  10 mL  1  . triamcinolone cream (KENALOG) 0.1 % Apply topically 2 (two) times daily.  45 g  11   No current facility-administered medications for this visit.   Family History  Problem Relation Age of Onset  . Cancer Mother   . Diabetes Other    History   Social History  . Marital Status: Single    Spouse Name: N/A    Number of Children: N/A  . Years of Education: N/A   Social History Main Topics  . Smoking status: Former Smoker    Types: Cigarettes  . Smokeless tobacco: None     Comment: quit 11yrs ago  . Alcohol Use: No  . Drug Use: No  . Sexual Activity: Yes    Birth  Control/ Protection: None   Other Topics Concern  . None   Social History Narrative    The patient works at a daycare center, she completed high school, is single   Review of Systems: Review of Systems  Constitutional: Positive for weight loss (intentional ). Negative for fever, chills, malaise/fatigue and diaphoresis.  HENT: Positive for neck pain (since 3 days ago). Negative for sore throat.   Eyes: Negative for blurred vision.  Cardiovascular: Negative for chest pain, palpitations and leg swelling.  Gastrointestinal: Positive for constipation (chronic). Negative for nausea, vomiting, abdominal pain and diarrhea.  Genitourinary: Negative for dysuria, urgency, frequency and hematuria.  Musculoskeletal: Positive for myalgias (right shoulder since 3 days ago).  Neurological: Negative for dizziness and headaches.  Psychiatric/Behavioral: Negative for depression and substance abuse.   Objective:  Physical Exam: Filed Vitals:   10/03/12 1528  BP: 127/82  Pulse: 73  Weight: 191 lb 11.2 oz (86.955 kg)  SpO2: 98%    Physical Exam  Constitutional: She is oriented to person, place, and time. She appears well-developed and well-nourished. No distress.  HENT:  Head: Normocephalic and atraumatic.  Eyes: Conjunctivae and EOM are normal.  Neck: Normal range of motion. Neck supple.  Cardiovascular: Normal rate and regular rhythm.   Pulmonary/Chest: Effort normal and breath sounds normal. No respiratory distress. She has no wheezes. She has no rales. She exhibits no tenderness.  Abdominal: Soft. Bowel sounds are normal. She exhibits no distension. There is no tenderness. There is no rebound and no guarding.  Obese  Genitourinary: Vagina normal. No labial fusion. There is no rash, tenderness, lesion or injury on the right labia. There is no rash, tenderness, lesion or injury on the left labia. Cervix exhibits discharge (white, non-odorous discharge surroining cervical os). Cervix exhibits no  friability. Right adnexum displays no mass, no tenderness and no fullness. Left adnexum displays no mass, no tenderness and no fullness. No erythema, tenderness or bleeding around the vagina. No foreign body around the vagina. No signs of injury around the vagina. No vaginal discharge found.  Musculoskeletal: She exhibits no edema and no tenderness.  Reduced ROM of right shoulder  Neurological: She is alert and oriented to person, place, and time.  Skin: Skin is warm and dry. No rash noted. She is not diaphoretic. No erythema. No pallor.  Psychiatric: She has a normal mood and affect. Her behavior is normal.   Assessment & Plan:  Please see problem list for problem-based assessment and plan

## 2012-10-04 DIAGNOSIS — M25511 Pain in right shoulder: Secondary | ICD-10-CM | POA: Insufficient documentation

## 2012-10-04 NOTE — Assessment & Plan Note (Addendum)
Pelvic exam performed today (9/19) including bimanual exam with Pap smear for cytology, wet mount, and STD testing.  External inspection did not reveal lesions or ulcerations indicative of genital herpes. No valvular edema/erythema, cervical friability or strawberry cervix was noted.  No cervical motion tenderness, adnexal tenderness, or masses were noted. A white thick non-odorous cervical discharge surrounding cervical os was seen.  Patient declined influenza vaccine.

## 2012-10-04 NOTE — Assessment & Plan Note (Addendum)
Assessment: Patient with BMI of 35.1 at last visit July 2014, currently improving with 7 lb weight loss due to lifestyle modification.        Plan:  -Obtain HbA1c (CBG) --> 4.7 (91) improved from 4.9 in May 2013 and CBG 129 May of 2014 -Continue to monitor for metabolic syndrome currently 2/5 criteria, increased waist circumference and mild hypertriglyceridemia

## 2012-10-04 NOTE — Assessment & Plan Note (Addendum)
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 She has been taking ibuprofen with no relief. She has had similar shoulder pain in the past that was relieved with flexiril.     Plan:  -Continue OTC NSAIDs as needed for pain  (normal renal function) -Prescribe flexiril 10 mg BID as needed for muscle spasm   -Will continue to monitor and obtain imaging if pain worsens or does not resolve with conservative management

## 2012-10-04 NOTE — Assessment & Plan Note (Addendum)
Assessment: Patient with well controlled blood pressure (127/82) at goal <140/90, currently on 5-6.25mg  bisoprolol/HCTZ.   Plan: -Continue current management  -Continue to monitor

## 2012-10-04 NOTE — Assessment & Plan Note (Addendum)
Assessment: Patient with well controlled asthma currently not on any daily controller medications. No recent exacerbations or need for corticosteroid or antibioitic use. She denies dyspnea, cough, chest tightness, or wheezing. She has not needed to use her inhaler recently. Her triggers are thought to include cigarette smoke.  Plan:  -Continue to monitor for changes  -Continue albuterol inhaler as needed for short term relief of bronchospasm

## 2012-10-06 NOTE — Progress Notes (Signed)
I saw and evaluated the patient.  I personally confirmed the key portions of the history and exam documented by Dr. Rabbani and I reviewed pertinent patient test results.  The assessment, diagnosis, and plan were formulated together and I agree with the documentation in the resident's note.  

## 2012-11-03 ENCOUNTER — Encounter (HOSPITAL_COMMUNITY): Payer: Self-pay | Admitting: Emergency Medicine

## 2012-11-03 ENCOUNTER — Emergency Department (INDEPENDENT_AMBULATORY_CARE_PROVIDER_SITE_OTHER)
Admission: EM | Admit: 2012-11-03 | Discharge: 2012-11-03 | Disposition: A | Discharge status: Home / Self Care | Payer: Self-pay | Source: Home / Self Care

## 2012-11-03 DIAGNOSIS — S46819A Strain of other muscles, fascia and tendons at shoulder and upper arm level, unspecified arm, initial encounter: Secondary | ICD-10-CM

## 2012-11-03 DIAGNOSIS — S46812A Strain of other muscles, fascia and tendons at shoulder and upper arm level, left arm, initial encounter: Secondary | ICD-10-CM

## 2012-11-03 DIAGNOSIS — S29012A Strain of muscle and tendon of back wall of thorax, initial encounter: Secondary | ICD-10-CM

## 2012-11-03 DIAGNOSIS — S43499A Other sprain of unspecified shoulder joint, initial encounter: Secondary | ICD-10-CM

## 2012-11-03 DIAGNOSIS — S239XXA Sprain of unspecified parts of thorax, initial encounter: Secondary | ICD-10-CM

## 2012-11-03 LAB — POCT URINALYSIS DIP (DEVICE)
Bilirubin Urine: NEGATIVE
Glucose, UA: NEGATIVE mg/dL
Ketones, ur: NEGATIVE mg/dL
Leukocytes, UA: NEGATIVE
Nitrite: NEGATIVE
Protein, ur: NEGATIVE mg/dL
Specific Gravity, Urine: 1.025 (ref 1.005–1.030)
Urobilinogen, UA: 0.2 mg/dL (ref 0.0–1.0)
pH: 7 (ref 5.0–8.0)

## 2012-11-03 MED ORDER — DICLOFENAC SODIUM 1 % TD GEL: 1.0000 "application " | Freq: Four times a day (QID) | TRANSDERMAL | Status: DC

## 2012-11-03 MED ORDER — TRAMADOL HCL 50 MG PO TABS: 50.0000 mg | ORAL_TABLET | Freq: Four times a day (QID) | ORAL | Status: DC | PRN

## 2012-11-03 MED ORDER — NAPROXEN 375 MG PO TABS: 375.0000 mg | ORAL_TABLET | Freq: Two times a day (BID) | ORAL | Status: DC

## 2012-11-03 NOTE — ED Provider Notes (Signed)
CSN: [id number]     Arrival date & time 11/03/12  1191 History   First MD Initiated Contact with Patient 11/03/12 1010     Chief Complaint  Patient presents with  . Back Pain   (Consider location/radiation/quality/duration/timing/severity/associated sxs/prior Treatment) HPI Comments: 47 year old female is complaining of left upper back pain. 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 She denies any known event or injury that caused the pain. It is worse with movement taking a deep breath, but there is a constant achiness over the upper left back.   Past Medical History  Diagnosis Date  . High risk sexual behavior   . Hypertension   . Bacterial vaginitis     recurrent  . Recurrent UTI   . Genital herpes   . Recurrent boils   . External hemorrhoids with complication   . Allergic rhinitis   . Chronic constipation   . History of cocaine abuse 1992  . History of tobacco abuse 1999  . Bilateral carpal tunnel syndrome   . Acne   . Hyperlipidemia   . Asthma    Past Surgical History  Procedure Laterality Date  . Tummy tuck  03/2010   Family History  Problem Relation Age of Onset  . Cancer Mother   . Diabetes Other    History  Substance Use Topics  . Smoking status: Former Smoker    Types: Cigarettes  . Smokeless tobacco: Not on file     Comment: quit 3yrs ago  . Alcohol Use: No   OB History   Grav Para Term Preterm Abortions TAB SAB Ect Mult Living                 Review of Systems  Constitutional: Negative for fever, chills and activity change.  HENT: Negative.   Respiratory: Negative.   Cardiovascular: Negative.   Musculoskeletal: Positive for back pain and myalgias. Negative for neck pain.       As per HPI  Skin: Negative for color change, pallor and rash.  Neurological: Negative.     Allergies  Orange fruit and Penicillins  Home Medications   Current Outpatient Rx  Name  Route  Sig  Dispense  Refill  .  bisoprolol-hydrochlorothiazide (ZIAC) 5-6.25 MG per tablet      TAKE ONE TABLET BY MOUTH ONCE DAILY   30 tablet   11   . carbamide peroxide (DEBROX) 6.5 % otic solution   Both Ears   Place 5 drops into both ears 2 (two) times daily.   30 mL   0   . cyclobenzaprine (FLEXERIL) 10 MG tablet   Oral   Take 1 tablet (10 mg total) by mouth 2 (two) times daily as needed for muscle spasms.   20 tablet   0   . desonide (DESOWEN) 0.05 % ointment   Topical   Apply 1 application topically 2 (two) times daily as needed. To affected area for eczema   60 g   11   . docusate sodium (COLACE) 100 MG capsule   Oral   Take 1 capsule (100 mg total) by mouth daily. For constipation   30 capsule   11   . fluocinolone (DERMA-SMOOTHE/FS SCALP) 0.01 % external oil   Topical   Apply topically 3 (three) times daily.   120 mL   11   . ibuprofen (ADVIL,MOTRIN) 800 MG tablet   Oral   Take 800 mg by mouth every 8 (eight) hours as needed  for pain.         Marland Kitchen loratadine (CLARITIN) 10 MG tablet   Oral   Take 10 mg by mouth daily as needed. For allergies         . NEOMYCIN-POLYMYXIN-HC, OTIC, (CORTISPORIN) 1 % SOLN   Both Ears   Place 3 drops into both ears every 6 (six) hours.   10 mL   1   . triamcinolone cream (KENALOG) 0.1 %   Topical   Apply topically 2 (two) times daily.   45 g   11   . EXPIRED: albuterol (PROVENTIL HFA;VENTOLIN HFA) 108 (90 BASE) MCG/ACT inhaler   Inhalation   Inhale 2 puffs into the lungs every 4 (four) hours as needed. For shortness of breath         . diclofenac sodium (VOLTAREN) 1 % GEL   Topical   Apply 1 application topically 4 (four) times daily.   100 g   0   . naproxen (NAPROSYN) 375 MG tablet   Oral   Take 1 tablet (375 mg total) by mouth 2 (two) times daily.   20 tablet   0   . traMADol (ULTRAM) 50 MG tablet   Oral   Take 1 tablet (50 mg total) by mouth every 6 (six) hours as needed for pain.   15 tablet   0    BP 147/80  Pulse 72   Temp(Src) 98.1 F (36.7 C) (Oral)  Resp 20  SpO2 97%  LMP 06/15/2012 Physical Exam  Constitutional: She is oriented to person, place, and time. She appears well-developed and well-nourished. No distress.  HENT:  Head: Normocephalic and atraumatic.  Eyes: EOM are normal.  Neck: Normal range of motion. Neck supple.  Pulmonary/Chest: Effort normal and breath sounds normal. No respiratory distress. She has no wheezes.  Musculoskeletal: She exhibits tenderness.  Tenderness over the left upper parathoracic musculature, the rhomboid and trapezius. Pain is exacerbated with right rotation of the torso. Tenderness over the left para thoracic musculature as well as the rhomboid and trapezius.  Neurological: She is alert and oriented to person, place, and time. No cranial nerve deficit.  Skin: Skin is warm and dry.  Psychiatric: She has a normal mood and affect.    ED Course  Procedures (including critical care time) Labs Review Labs Reviewed  POCT URINALYSIS DIP (DEVICE) - Abnormal; Notable for the following:    Hgb urine dipstick SMALL (*)    All other components within normal limits   Imaging Review No results found.    MDM   1. Rhomboid muscle strain, initial encounter   2. Trapezius strain, left, initial encounter       Place votaren gel over the area of soreness to the left upper back and and wear a ThermaCare heat wrap over it. He also use a heating pad in place of a ThermaCare application. Tramadol 50 mg every 4 hours when necessary pain #15 Naprosyn 375 mg twice a day with food when necessary Stretches as demonstrated but limit activity with your arms raised or overhead work that produces pain.  Hayden Rasmussen, NP 11/03/12 1047

## 2012-11-03 NOTE — ED Notes (Signed)
C/o left side back pain for three days now.  Denies injury.  OTC pain medications taken but no relief.

## 2012-11-04 NOTE — ED Provider Notes (Signed)
Medical screening examination/treatment/procedure(s) were performed by a resident physician or non-physician practitioner and as the supervising physician I was immediately available for consultation/collaboration.  Clementeen Graham, MD    Rodolph Bong, MD 11/04/12 [phone]

## 2012-11-07 ENCOUNTER — Encounter: Payer: Self-pay | Admitting: Internal Medicine

## 2012-11-07 ENCOUNTER — Ambulatory Visit (INDEPENDENT_AMBULATORY_CARE_PROVIDER_SITE_OTHER): Payer: No Typology Code available for payment source | Admitting: Internal Medicine

## 2012-11-07 VITALS — BP 157/96 | HR 73 | Temp 98.7°F | Wt 195.7 lb

## 2012-11-07 DIAGNOSIS — M545 Low back pain, unspecified: Secondary | ICD-10-CM

## 2012-11-07 DIAGNOSIS — R252 Cramp and spasm: Secondary | ICD-10-CM

## 2012-11-07 DIAGNOSIS — I10 Essential (primary) hypertension: Secondary | ICD-10-CM

## 2012-11-07 MED ORDER — IBUPROFEN 800 MG PO TABS: 800.0000 mg | ORAL_TABLET | Freq: Three times a day (TID) | ORAL | Status: DC | PRN

## 2012-11-07 MED ORDER — BISOPROLOL-HYDROCHLOROTHIAZIDE 5-6.25 MG PO TABS: 2.0000 | ORAL_TABLET | Freq: Every day | ORAL | Status: DC

## 2012-11-07 NOTE — Assessment & Plan Note (Signed)
Assessment: Most like diagnosis musculoskeletal low back pain with lumbar-sacral strain in view of history lifting heavy objects prior to onset of pain. Predisposing factors include age, and history of straining her back. No evidence of red flags any history of physical exam.  Plan: 1. Labs/imaging: None indicated 2. Therapy: Will call in a prescription of ibuprofen 800 mg 3 times a day for 14 days; recommended back rest and use of heating pads. Continue with Flexeril 3. Do not refill tramadol on naproxen  4. Follow up : in 2 weeks or when necessary

## 2012-11-07 NOTE — Assessment & Plan Note (Signed)
BP Readings from Last 3 Encounters:  11/07/12 157/96  11/03/12 147/80  10/03/12 127/82    Lab Results  Component Value Date   NA 141 05/15/2012   K 3.7 05/15/2012   CREATININE 0.76 05/15/2012    Assessment: Blood pressure control: moderately elevated Progress toward BP goal:  unchanged Comments: Compliant with her treatment  Plan: Medications: Increase bisoprolol-hydrochlorothiazide from 5-6.25 mg daily to 10-12.5 mg daily Educational resources provided:   Self management tools provided:   Other plans:  Follow up in 2 weeks

## 2012-11-07 NOTE — Patient Instructions (Signed)
Please take Ibuprofen 800 mg three times daily  Please you may use heating pads for your back  Please ensure enough rest and do not lift heavy objects I have increased you Ziac to 2 pills daily Please come back as needed     Lumbosacral Strain Lumbosacral strain is one of the most common causes of back pain. There are many causes of back pain. Most are not serious conditions. CAUSES  Your backbone (spinal column) is made up of 24 main vertebral bodies, the sacrum, and the coccyx. These are held together by muscles and tough, fibrous tissue (ligaments). Nerve roots pass through the openings between the vertebrae. A sudden move or injury to the back may cause injury to, or pressure on, these nerves. This may result in localized back pain or pain movement (radiation) into the buttocks, down the leg, and into the foot. Sharp, shooting pain from the buttock down the back of the leg (sciatica) is frequently associated with a ruptured (herniated) disk. Pain may be caused by muscle spasm alone. Your caregiver can often find the cause of your pain by the details of your symptoms and an exam. In some cases, you may need tests (such as X-rays). Your caregiver will work with you to decide if any tests are needed based on your specific exam.   HOME CARE INSTRUCTIONS   Avoid an underactive lifestyle. Active exercise, as directed by your caregiver, is your greatest weapon against back pain.  Avoid hard physical activities (tennis, racquetball, waterskiing) if you are not in proper physical condition for it. This may aggravate or create problems.  If you have a back problem, avoid sports requiring sudden body movements. Swimming and walking are generally safer activities.  Maintain good posture.  Avoid becoming overweight (obese).  Use bed rest for only the most extreme, sudden (acute) episode. Your caregiver will help you determine how much bed rest is necessary.  For acute conditions, you may put ice  on the injured area.  Put ice in a plastic bag.  Place a towel between your skin and the bag.  Leave the ice on for 15-20 minutes at a time, every 2 hours, or as needed.  After you are improved and more active, it may help to apply heat for 30 minutes before activities. See your caregiver if you are having pain that lasts longer than expected. Your caregiver can advise appropriate exercises or therapy if needed. With conditioning, most back problems can be avoided. SEEK IMMEDIATE MEDICAL CARE IF:   You have numbness, tingling, weakness, or problems with the use of your arms or legs.  You experience severe back pain not relieved with medicines.  There is a change in bowel or bladder control.  You have increasing pain in any area of the body, including your belly (abdomen).  You notice shortness of breath, dizziness, or feel faint.  You feel sick to your stomach (nauseous), are throwing up (vomiting), or become sweaty.  You notice discoloration of your toes or legs, or your feet get very cold.  Your back pain is getting worse.  You have a fever. MAKE SURE YOU:   Understand these instructions.  Will watch your condition.  Will get help right away if you are not doing well or get worse. Document Released: 10/11/2004 Document Revised: 03/26/2011 Document Reviewed: 04/02/2008 [facility] Patient Information 2014 [place], [place].

## 2012-11-07 NOTE — Progress Notes (Signed)
Patient ID: Carrie Franco, female   DOB: 1965/06/17, 47 y.o.   MRN: [id number]   Subjective:   HPI: [name] is a 47 y.o. woman with past medical history of hypertension, asthma, carpal tunnel syndrome, and other medical problems presents to the clinic for followup visit from urgent care due to back pain.  Patient reports that her back pain has been present for the last 1 week. Pain is sharp, nonradiating, located in the lower back. It is an 8/10, increased with activity, and sitting. She doesn't remember history of trauma, however, she reports, that she attempted to lift heavy objects a few days prior to onset of pain. She does not report neurological symptoms, like tingling, numbness, loss of sphincter control or loss of sensation. She was evaluated in urgent care on 11/03/2012 and the prescription of naproxen, and tramadol was given by the patient has not filled this prescription. Instead, she's been using ibuprofen 800 mg twice a day without much relief. She is also using Flexeril 10 mg twice a day when necessary. She denies fevers, chills, rigors, or any other constitutional symptoms.   She reports compliance with her medications for blood pressure. She bisoprolol-hydrochlorothiazide 5-6.25 mg daily. She to this medication on the morning of presentation. Her blood pressure is elevated at 156/96.     Past Medical History  Diagnosis Date  . High risk sexual behavior   . Hypertension   . Bacterial vaginitis     recurrent  . Recurrent UTI   . Genital herpes   . Recurrent boils   . External hemorrhoids with complication   . Allergic rhinitis   . Chronic constipation   . History of cocaine abuse 1992  . History of tobacco abuse 1999  . Bilateral carpal tunnel syndrome   . Acne   . Hyperlipidemia   . Asthma    Current Outpatient Prescriptions  Medication Sig Dispense Refill  . bisoprolol-hydrochlorothiazide (ZIAC) 5-6.25 MG per tablet Take 2 tablets by mouth daily. TAKE ONE  TABLET BY MOUTH ONCE DAILY  60 tablet  11  . carbamide peroxide (DEBROX) 6.5 % otic solution Place 5 drops into both ears 2 (two) times daily.  30 mL  0  . cyclobenzaprine (FLEXERIL) 10 MG tablet Take 1 tablet (10 mg total) by mouth 2 (two) times daily as needed for muscle spasms.  20 tablet  0  . desonide (DESOWEN) 0.05 % ointment Apply 1 application topically 2 (two) times daily as needed. To affected area for eczema  60 g  11  . diclofenac sodium (VOLTAREN) 1 % GEL Apply 1 application topically 4 (four) times daily.  100 g  0  . docusate sodium (COLACE) 100 MG capsule Take 1 capsule (100 mg total) by mouth daily. For constipation  30 capsule  11  . fluocinolone (DERMA-SMOOTHE/FS SCALP) 0.01 % external oil Apply topically 3 (three) times daily.  120 mL  11  . ibuprofen (ADVIL,MOTRIN) 800 MG tablet Take 1 tablet (800 mg total) by mouth every 8 (eight) hours as needed for pain.  42 tablet  0  . loratadine (CLARITIN) 10 MG tablet Take 10 mg by mouth daily as needed. For allergies      . NEOMYCIN-POLYMYXIN-HC, OTIC, (CORTISPORIN) 1 % SOLN Place 3 drops into both ears every 6 (six) hours.  10 mL  1  . triamcinolone cream (KENALOG) 0.1 % Apply topically 2 (two) times daily.  45 g  11  . albuterol (PROVENTIL HFA;VENTOLIN HFA) 108 (90 BASE)  MCG/ACT inhaler Inhale 2 puffs into the lungs every 4 (four) hours as needed. For shortness of breath       No current facility-administered medications for this visit.   Family History  Problem Relation Age of Onset  . Cancer Mother   . Diabetes Other    History   Social History  . Marital Status: Single    Spouse Name: N/A    Number of Children: N/A  . Years of Education: N/A   Social History Main Topics  . Smoking status: Former Smoker    Types: Cigarettes  . Smokeless tobacco: None     Comment: quit 51yrs ago  . Alcohol Use: No  . Drug Use: No  . Sexual Activity: Yes    Birth Control/ Protection: None   Other Topics Concern  . None   Social  History Narrative    The patient works at a daycare center, she completed high school, is single   Review of Systems: Constitutional: Denies fever, chills, diaphoresis, appetite change and fatigue.  Respiratory: Denies SOB, DOE, cough, chest tightness, and wheezing. Denies chest pain. Cardiovascular: No chest pain, palpitations and leg swelling.  Gastrointestinal: No abdominal pain, nausea, vomiting, bloody stools Genitourinary: No dysuria, frequency, hematuria, or flank pain.  Musculoskeletal: No myalgias, joint swelling, arthralgias  Psych: No depression symptoms. No SI or SA.   Objective:  Physical Exam: Filed Vitals:   11/07/12 0923 11/07/12 1003  BP: 157/94 157/96  Pulse: 71 73  Temp: 98.7 F (37.1 C)   TempSrc: Oral   Weight: 195 lb 11.2 oz (88.769 kg)   SpO2: 71%    General: Well nourished. No acute distress. Husband in exam room. HEENT: Normal oral mucosa. MMM.  Lungs: CTA bilaterally. Heart: RRR; no extra sounds or murmurs  Abdomen: Non-distended, normal BS, soft, nontender; no hepatosplenomegaly. No frank tenderness. Extremities: No pedal edema in the lower extremities. No joint swelling or tenderness in the lower extremities bilaterally. Bilateral straight leg raising is negative. Back: Demonstrates tenderness in the lumbar region, left side > right side. No signs of inflammation or infection. No increased muscle tension. Neurologic: Normal EOM,  Alert and oriented x3. No obvious neurologic/cranial nerve deficits.  Assessment & Plan:  I have discussed my assessment and plan  with  my attending in the clinic, Dr.Mullen  as detailed under problem based charting.

## 2012-11-10 ENCOUNTER — Encounter: Payer: No Typology Code available for payment source | Admitting: Internal Medicine

## 2012-11-10 ENCOUNTER — Telehealth: Payer: Self-pay | Admitting: *Deleted

## 2012-11-10 NOTE — Progress Notes (Signed)
Case discussed with Dr.Kazibwe at the time of the visit.  We reviewed the resident's history and exam and pertinent patient test results.  I agree with the assessment, diagnosis, and plan of care documented in the resident's note.    

## 2012-11-10 NOTE — Telephone Encounter (Signed)
Pt call stating she was seen in clinic last week   She was started on IBU for low back pain.  Since then noticed an odor and thought she might have a yeast infection.   She is not on any antibiotic but does admit to frequency, and dysuria. Will see today to R/O UTI.

## 2012-11-12 ENCOUNTER — Other Ambulatory Visit: Payer: Self-pay | Admitting: Internal Medicine

## 2012-11-12 ENCOUNTER — Ambulatory Visit (INDEPENDENT_AMBULATORY_CARE_PROVIDER_SITE_OTHER): Payer: No Typology Code available for payment source | Admitting: Internal Medicine

## 2012-11-12 ENCOUNTER — Encounter: Payer: Self-pay | Admitting: Internal Medicine

## 2012-11-12 VITALS — BP 127/81 | HR 75 | Temp 97.6°F | Ht 67.0 in | Wt 194.1 lb

## 2012-11-12 DIAGNOSIS — Z0000 Encounter for general adult medical examination without abnormal findings: Secondary | ICD-10-CM

## 2012-11-12 DIAGNOSIS — Z1231 Encounter for screening mammogram for malignant neoplasm of breast: Secondary | ICD-10-CM

## 2012-11-12 DIAGNOSIS — R824 Acetonuria: Secondary | ICD-10-CM

## 2012-11-12 DIAGNOSIS — N760 Acute vaginitis: Secondary | ICD-10-CM

## 2012-11-12 DIAGNOSIS — R300 Dysuria: Secondary | ICD-10-CM

## 2012-11-12 DIAGNOSIS — I10 Essential (primary) hypertension: Secondary | ICD-10-CM

## 2012-11-12 LAB — POCT URINALYSIS DIPSTICK
Bilirubin, UA: NEGATIVE
Glucose, UA: NEGATIVE
Leukocytes, UA: NEGATIVE
Nitrite, UA: NEGATIVE
Spec Grav, UA: >=1.03
pH, UA: 6.5

## 2012-11-12 MED ORDER — METRONIDAZOLE ER 750 MG PO TB24: 750.0000 mg | ORAL_TABLET | Freq: Every day | ORAL | Status: AC

## 2012-11-12 MED ORDER — BISOPROLOL-HYDROCHLOROTHIAZIDE 5-6.25 MG PO TABS: 2.0000 | ORAL_TABLET | Freq: Every day | ORAL | Status: DC

## 2012-11-12 NOTE — Assessment & Plan Note (Addendum)
Urinary frequency (HCTZ dose was increased on 11/07/12) and fishy vaginal odor x2 days. H/o recurrent bacterial vaginosis. No infection in about 1 year, until this month. Urine dipstick negative for nitrites or leukocytes. Pt was seen at [facility] on 10/15/12 where a pelvic exam was performed and the prep was positive for BV, for which she was treated with Flagyl x7 days but warned that the infection may return requiring another round of abx. Will treat with another round of Flagyl x7 days. She is to return to the clinic for further evaluation. - Flagyl 750mg  daily x7d

## 2012-11-12 NOTE — Assessment & Plan Note (Signed)
Urinary frequency x2 days. Urine dipstick negative for nitrites or leukocytes. Positive for ketones, which is likely 2/2 to her diet. No h/o diabetes.

## 2012-11-12 NOTE — Progress Notes (Signed)
Patient ID: Carrie Franco, female   DOB: 1965-09-25, 47 y.o.   MRN: [id number]  Subjective:   Patient ID: Carrie Franco female   DOB: [birth year] 48 y.o.   MRN: [id number]  HPI: [name] is a 47 y.o. F with PMH hypertension, asthma, carpal tunnel syndrome, and recurrent vaginitis presens to the clinic c/o vaginal odor and urinary frequency.   She denies dysuria but is having increased urinary frequency x2 days. H/o recurrent vaginitis 2/2 BV. Pt went to [facility] on 10/1, had a pelvic exam done, and was found to have BV. She was treated with Flagyl, but was told that she might need another round of abx, as the infection might not be fully treated. She states that over the past 2 days she's been noticing a fishy vaginal odor, similar to when she has her bacterial vaginosis infections, but not as severe.  Of note, she states that she is taking phentermine intermittently over the past year and is on a lower carbohydrate diet. She states that she has lost 13lbs.   Past Medical History  Diagnosis Date  . High risk sexual behavior   . Hypertension   . Bacterial vaginitis     recurrent  . Recurrent UTI   . Genital herpes   . Recurrent boils   . External hemorrhoids with complication   . Allergic rhinitis   . Chronic constipation   . History of cocaine abuse 1992  . History of tobacco abuse 1999  . Bilateral carpal tunnel syndrome   . Acne   . Hyperlipidemia   . Asthma    Current Outpatient Prescriptions  Medication Sig Dispense Refill  . albuterol (PROVENTIL HFA;VENTOLIN HFA) 108 (90 BASE) MCG/ACT inhaler Inhale 2 puffs into the lungs every 4 (four) hours as needed. For shortness of breath      . bisoprolol-hydrochlorothiazide (ZIAC) 5-6.25 MG per tablet Take 2 tablets by mouth daily. TAKE ONE TABLET BY MOUTH ONCE DAILY  60 tablet  11  . carbamide peroxide (DEBROX) 6.5 % otic solution Place 5 drops into both ears 2 (two) times daily.  30 mL  0  . cyclobenzaprine (FLEXERIL) 10  MG tablet Take 1 tablet (10 mg total) by mouth 2 (two) times daily as needed for muscle spasms.  20 tablet  0  . desonide (DESOWEN) 0.05 % ointment Apply 1 application topically 2 (two) times daily as needed. To affected area for eczema  60 g  11  . diclofenac sodium (VOLTAREN) 1 % GEL Apply 1 application topically 4 (four) times daily.  100 g  0  . docusate sodium (COLACE) 100 MG capsule Take 1 capsule (100 mg total) by mouth daily. For constipation  30 capsule  11  . fluocinolone (DERMA-SMOOTHE/FS SCALP) 0.01 % external oil Apply topically 3 (three) times daily.  120 mL  11  . ibuprofen (ADVIL,MOTRIN) 800 MG tablet Take 1 tablet (800 mg total) by mouth every 8 (eight) hours as needed for pain.  42 tablet  0  . loratadine (CLARITIN) 10 MG tablet Take 10 mg by mouth daily as needed. For allergies      . NEOMYCIN-POLYMYXIN-HC, OTIC, (CORTISPORIN) 1 % SOLN Place 3 drops into both ears every 6 (six) hours.  10 mL  1  . triamcinolone cream (KENALOG) 0.1 % Apply topically 2 (two) times daily.  45 g  11   No current facility-administered medications for this visit.   Family History  Problem Relation Age of Onset  .  Cancer Mother   . Diabetes Other    History   Social History  . Marital Status: Single    Spouse Name: N/A    Number of Children: N/A  . Years of Education: N/A   Social History Main Topics  . Smoking status: Former Smoker    Types: Cigarettes  . Smokeless tobacco: None     Comment: quit 15yrs ago  . Alcohol Use: No  . Drug Use: No  . Sexual Activity: Yes    Birth Control/ Protection: None   Other Topics Concern  . None   Social History Narrative    The patient works at a daycare center, she completed high school, is single   Review of Systems: Constitutional: Denies fever, chills, diaphoresis. +decreased appetite on phentermine Genitourinary: +urinary frequency, and vaginal odor. Denies dysuria, urgency, hematuria, flank pain and difficulty urinating.   A 12 point  ROS was performed, all other systems are negative   Objective:  Physical Exam: Filed Vitals:   11/12/12 0833  BP: 127/81  Pulse: 75  Temp: 97.6 F (36.4 C)  TempSrc: Oral  Height: 5\' 7"  (1.702 m)  Weight: 194 lb 1.6 oz (88.043 kg)  SpO2: 100%   Constitutional: Vital signs reviewed.  Patient is a well-developed and well-nourished female in no acute distress and cooperative with exam.  Head: Normocephalic and atraumatic Eyes: PERRL, EOMI, conjunctivae normal, No scleral icterus.  Cardiovascular: RRR, no MRG Pulmonary/Chest: normal respiratory effort, CTAB, no wheezes, rales, or rhonchi Abdominal: Soft. Non-tender, non-distended, bowel sounds are normal, no masses, organomegaly, or guarding present.  GU: No CVA tenderness Musculoskeletal: No joint deformities, erythema, or stiffness Neurological: A&O x3, nonfocal  Psychiatric: Normal mood and affect. speech and behavior is normal.   Assessment & Plan:   Please refer to Problem List based Assessment and Plan

## 2012-11-12 NOTE — Assessment & Plan Note (Signed)
BP Readings from Last 3 Encounters:  11/12/12 127/81  11/07/12 157/96  11/03/12 147/80    Lab Results  Component Value Date   NA 141 05/15/2012   K 3.7 05/15/2012   CREATININE 0.76 05/15/2012    Assessment: Blood pressure control: controlled Progress toward BP goal:  at goal Comments: Compliance with meds  Plan: Medications:  continue current medications Educational resources provided: brochure;handout;video Self management tools provided:   Other plans: F/u in 5-80mo with PCP

## 2012-11-12 NOTE — Assessment & Plan Note (Addendum)
On low carb diet and taking phentermine intermittently x 1 year. Dipstick with trace ketones, none seen on UA from 10/20.  - Urine sent for UA.

## 2012-11-12 NOTE — Patient Instructions (Signed)
Bacterial Vaginosis Bacterial vaginosis (BV) is a vaginal infection where the normal balance of bacteria in the vagina is disrupted. The normal balance is then replaced by an overgrowth of certain bacteria. There are several different kinds of bacteria that can cause BV. BV is the most common vaginal infection in women of childbearing age. CAUSES   The cause of BV is not fully understood. BV develops when there is an increase or imbalance of harmful bacteria.  Some activities or behaviors can upset the normal balance of bacteria in the vagina and put women at increased risk including:  Having a new sex partner or multiple sex partners.  Douching.  Using an intrauterine device (IUD) for contraception.  It is not clear what role sexual activity plays in the development of BV. However, women that have never had sexual intercourse are rarely infected with BV. Women do not get BV from toilet seats, bedding, swimming pools or from touching objects around them.  SYMPTOMS   Grey vaginal discharge.  A fish-like odor with discharge, especially after sexual intercourse.  Itching or burning of the vagina and vulva.  Burning or pain with urination.  Some women have no signs or symptoms at all. DIAGNOSIS  Your caregiver must examine the vagina for signs of BV. Your caregiver will perform lab tests and look at the sample of vaginal fluid through a microscope. They will look for bacteria and abnormal cells (clue cells), a pH test higher than 4.5, and a positive amine test all associated with BV.  RISKS AND COMPLICATIONS   Pelvic inflammatory disease (PID).  Infections following gynecology surgery.  Developing HIV.  Developing herpes virus. TREATMENT  Sometimes BV will clear up without treatment. However, all women with symptoms of BV should be treated to avoid complications, especially if gynecology surgery is planned. Female partners generally do not need to be treated. However, BV may spread  between female sex partners so treatment is helpful in preventing a recurrence of BV.   BV may be treated with antibiotics. The antibiotics come in either pill or vaginal cream forms. Either can be used with nonpregnant or pregnant women, but the recommended dosages differ. These antibiotics are not harmful to the baby.  BV can recur after treatment. If this happens, a second round of antibiotics will often be prescribed.  Treatment is important for pregnant women. If not treated, BV can cause a premature delivery, especially for a pregnant woman who had a premature birth in the past. All pregnant women who have symptoms of BV should be checked and treated.  For chronic reoccurrence of BV, treatment with a type of prescribed gel vaginally twice a week is helpful. HOME CARE INSTRUCTIONS   Finish all medication as directed by your caregiver.  Do not have sex until treatment is completed.  Tell your sexual partner that you have a vaginal infection. They should see their caregiver and be treated if they have problems, such as a mild rash or itching.  Practice safe sex. Use condoms. Only have 1 sex partner. PREVENTION  Basic prevention steps can help reduce the risk of upsetting the natural balance of bacteria in the vagina and developing BV:  Do not have sexual intercourse (be abstinent).  Do not douche.  Use all of the medicine prescribed for treatment of BV, even if the signs and symptoms go away.  Tell your sex partner if you have BV. That way, they can be treated, if needed, to prevent reoccurrence. SEEK MEDICAL CARE IF:     Your symptoms are not improving after 3 days of treatment.  You have increased discharge, pain, or fever. MAKE SURE YOU:   Understand these instructions.  Will watch your condition.  Will get help right away if you are not doing well or get worse. FOR MORE INFORMATION  Division of STD Prevention (DSTDP), Centers for Disease Control and Prevention:  www.cdc.gov/std American Social Health Association (ASHA): www.ashastd.org  Document Released: 01/01/2005 Document Revised: 03/26/2011 Document Reviewed: 06/24/2008 [employer] Patient Information 2014 [employer].  

## 2012-11-12 NOTE — Assessment & Plan Note (Signed)
Pt refused flu vaccine today.

## 2012-11-12 NOTE — Progress Notes (Signed)
[facility] states Flagyl 750mg  is not covered - order for metronidazole 500mg  #14 one tablet twice a day per Dr Sherrine Maples. Stanton Kidney Avonte Sensabaugh RN 11/12/12 2PM

## 2012-11-13 LAB — URINALYSIS, ROUTINE W REFLEX MICROSCOPIC
Glucose, UA: NEGATIVE mg/dL
Hgb urine dipstick: NEGATIVE
Leukocytes, UA: NEGATIVE
Nitrite: NEGATIVE
Protein, ur: NEGATIVE mg/dL
pH: 6 (ref 5.0–8.0)

## 2012-11-14 NOTE — Progress Notes (Signed)
Case discussed with Dr. Glenn at the time of the visit.  We reviewed the resident's history and exam and pertinent patient test results.  I agree with the assessment, diagnosis, and plan of care documented in the resident's note.   

## 2012-11-20 ENCOUNTER — Other Ambulatory Visit: Payer: Self-pay

## 2012-11-20 ENCOUNTER — Ambulatory Visit: Payer: Self-pay | Admitting: Unknown Physician Specialty

## 2012-11-24 ENCOUNTER — Telehealth: Payer: Self-pay | Admitting: *Deleted

## 2012-11-24 NOTE — Telephone Encounter (Signed)
Pt called 4:10PM - no vaginal bleeding past 4 months. LMP 11/22/12 - flow heavy since 11/23/12 - changing pad about 10 to 15 minutes. No GYN doctor. Suggest to go to [facility] ER for heavy bleeding. Stanton Kidney Jacquel Mccamish RN 11/24/12 4:15PM

## 2012-11-26 ENCOUNTER — Inpatient Hospital Stay (HOSPITAL_COMMUNITY)
Admission: AD | Admit: 2012-11-26 | Discharge: 2012-11-26 | Disposition: A | Discharge status: Home / Self Care | Payer: No Typology Code available for payment source | Source: Ambulatory Visit | Attending: Obstetrics & Gynecology | Admitting: Obstetrics & Gynecology

## 2012-11-26 ENCOUNTER — Encounter (HOSPITAL_COMMUNITY): Payer: Self-pay | Admitting: *Deleted

## 2012-11-26 DIAGNOSIS — M549 Dorsalgia, unspecified: Secondary | ICD-10-CM | POA: Insufficient documentation

## 2012-11-26 DIAGNOSIS — N938 Other specified abnormal uterine and vaginal bleeding: Secondary | ICD-10-CM | POA: Insufficient documentation

## 2012-11-26 DIAGNOSIS — N949 Unspecified condition associated with female genital organs and menstrual cycle: Secondary | ICD-10-CM | POA: Insufficient documentation

## 2012-11-26 DIAGNOSIS — R109 Unspecified abdominal pain: Secondary | ICD-10-CM | POA: Insufficient documentation

## 2012-11-26 LAB — URINE MICROSCOPIC-ADD ON

## 2012-11-26 LAB — URINALYSIS, ROUTINE W REFLEX MICROSCOPIC
Bilirubin Urine: NEGATIVE
Glucose, UA: NEGATIVE mg/dL
Leukocytes, UA: NEGATIVE
Specific Gravity, Urine: 1.01 (ref 1.005–1.030)
Urobilinogen, UA: 0.2 mg/dL (ref 0.0–1.0)

## 2012-11-26 LAB — POCT PREGNANCY, URINE: Preg Test, Ur: NEGATIVE

## 2012-11-26 NOTE — MAU Provider Note (Signed)
History     CSN: [id number]  Arrival date and time: 11/26/12 4098   None     Chief Complaint  Patient presents with  . Vaginal Bleeding  . Abdominal Pain   Vaginal Bleeding Associated symptoms include abdominal pain and back pain. Pertinent negatives include no chills, constipation, diarrhea, dysuria, fever, flank pain, frequency, headaches, nausea or vomiting.  Abdominal Pain Pertinent negatives include no constipation, diarrhea, dysuria, fever, frequency, headaches, nausea or vomiting.    Ms. Drum is a [phone] [id number] who presents to the MAU today with c/o heavy vaginal bleeding.  She has not had menses in 5 months, but explains that she started bleeding on 11/8.  She denies any significant spotting since 5 months ago.The bleeding is much heavier than she had ever experienced, and she was saturating a super + tampon Q10-15 minutes.  She called her PCP at [facility] outpatient clinic to schedule an appointment, and she was told to come to [facility].  She does not have a gynecologist, but receives her routine annual exams at her PCP.  Her last pap smear was 3 weeks ago, and was normal.  She denies any h/o of gynecological issues. She is not sexually active, denies any chance of pregnancy, and is not taking any OCPs.  She reports having a BTL after her last pregnancy.    She states that on 11/8 her bleeding was very heavy and causing her to soak through tampons and pads, and her clothing.  She has also passed large clots of blood, but claims that she has not noticed any clots today.  The bleeding is still heavy, but has lightened today.  She continues to double-up her pads, but has only had to change every 30 minutes today. The bleeding is accompanied by pelvic cramping and LBP.  The pain is described as crampy, it is intermittent, and it does not radiate.  It is rated as a 5/10 currently, but is an 8/10 at its worst.  She has taken advil and aleve prn to help with the pain.  She reports  that it is helpful, but the pain returns when the medicine wears off. She denies ever having anything like this before.  She also denies HA, SOB, CP, N/V, diarrhea, constipation, dysuria, vaginal d/c, LE swelling or pain, fever or chills.   OB History   Grav Para Term Preterm Abortions TAB SAB Ect Mult Living   4 3   1  1   3       Past Medical History  Diagnosis Date  . High risk sexual behavior   . Hypertension   . Bacterial vaginitis     recurrent  . Recurrent UTI   . Genital herpes   . Recurrent boils   . External hemorrhoids with complication   . Allergic rhinitis   . Chronic constipation   . History of cocaine abuse 1992  . History of tobacco abuse 1999  . Bilateral carpal tunnel syndrome   . Acne   . Hyperlipidemia   . Asthma     Past Surgical History  Procedure Laterality Date  . Tummy tuck  03/2010    Family History  Problem Relation Age of Onset  . Cancer Mother   . Diabetes Other     History  Substance Use Topics  . Smoking status: Former Smoker    Types: Cigarettes  . Smokeless tobacco: Not on file     Comment: quit 30yrs ago  . Alcohol Use: No  Allergies:  Allergies  Allergen Reactions  . Orange Fruit [Citrus] Hives    Pt describes reaction as big bumps  . Penicillins Hives    No prescriptions prior to admission    Review of Systems  Constitutional: Negative for fever, chills and malaise/fatigue.  Eyes: Negative for blurred vision.  Respiratory: Negative for shortness of breath.   Cardiovascular: Negative for chest pain.  Gastrointestinal: Positive for abdominal pain. Negative for heartburn, nausea, vomiting, diarrhea and constipation.  Genitourinary: Positive for vaginal bleeding. Negative for dysuria, frequency and flank pain.  Musculoskeletal: Positive for back pain.  Neurological: Negative for dizziness and headaches.  Endo/Heme/Allergies: Does not bruise/bleed easily.   Physical Exam   Blood pressure 132/78, pulse 71,  temperature 98.5 F (36.9 C), temperature source Oral, resp. rate 16, height 5' 4.5" (1.638 m), weight 90.538 kg (199 lb 9.6 oz), last menstrual period 11/22/2012, SpO2 99.00%.  Physical Exam  Constitutional: She is oriented to person, place, and time. She appears well-developed and well-nourished. No distress.  HENT:  Head: Normocephalic and atraumatic.  Eyes: Conjunctivae and EOM are normal.  Neck: Neck supple.  Cardiovascular: Normal rate, regular rhythm, normal heart sounds and intact distal pulses.  Exam reveals no gallop and no friction rub.   No murmur heard. Respiratory: Effort normal and breath sounds normal. She has no wheezes.  GI: Soft. Bowel sounds are normal. She exhibits no distension. There is tenderness. There is no rebound and no guarding.  Mild suprapubic tenderness to palpation   Genitourinary: Vaginal discharge found.  Pelvic exam: VULVA: normal appearing vulva with no masses, tenderness or lesions, VAGINA: vaginal discharge - bloody and scant, CERVIX: posterior-facing, cervical discharge present - bloody and scant, UTERUS: uterus is normal size, shape, consistency and nontender, ADNEXA: normal adnexa in size, nontender and no masses.    Musculoskeletal: Normal range of motion. She exhibits no edema and no tenderness.  Neurological: She is alert and oriented to person, place, and time.  Skin: Skin is warm.  Psychiatric: She has a normal mood and affect.   Results for orders placed during the hospital encounter of 11/26/12 (from the past 24 hour(s))  URINALYSIS, ROUTINE W REFLEX MICROSCOPIC     Status: Abnormal   Collection Time    11/26/12 10:10 AM      Result Value Range   Color, Urine YELLOW  YELLOW   APPearance CLEAR  CLEAR   Specific Gravity, Urine 1.010  1.005 - 1.030   pH 6.0  5.0 - 8.0   Glucose, UA NEGATIVE  NEGATIVE mg/dL   Hgb urine dipstick MODERATE (*) NEGATIVE   Bilirubin Urine NEGATIVE  NEGATIVE   Ketones, ur NEGATIVE  NEGATIVE mg/dL   Protein,  ur NEGATIVE  NEGATIVE mg/dL   Urobilinogen, UA 0.2  0.0 - 1.0 mg/dL   Nitrite NEGATIVE  NEGATIVE   Leukocytes, UA NEGATIVE  NEGATIVE  URINE MICROSCOPIC-ADD ON     Status: Abnormal   Collection Time    11/26/12 10:10 AM      Result Value Range   Squamous Epithelial / LPF FEW (*) RARE   RBC / HPF 7-10  <3 RBC/hpf   Bacteria, UA RARE  RARE  POCT PREGNANCY, URINE     Status: None   Collection Time    11/26/12 11:16 AM      Result Value Range   Preg Test, Ur NEGATIVE  NEGATIVE    MAU Course  Procedures  MDM H&P UA nml, UPT (-) Vaginal exam with speculum: no  wet prep, no GC/Chlamydia obtained  Scheduled outpatient U/S CBC ordered- pt left prior to labs being drawn Pt left prior to discharge; AMA  Assessment and Plan  A: -Dysfunctional Uterine Bleeding   Christiana Fuchs 11/26/2012, 12:08 PM   I examined pt and agree with documentation above and PA-S plan of care.  Pt did not indicate that she was unwilling to stay for lab draw, she even indicated which arm to draw blood.  Pt called on her cell phone, however did not answer, left message. [facility]

## 2012-11-26 NOTE — MAU Note (Signed)
Patient states she had not had a period in about 5 months. Started bleeding on 11-8 and states she was changing tampons every 10 minutes. States she started having abdominal pain at the same time. States bleeding and pain a little less yesterday but heavy bleeding and a lot of pain again today.

## 2012-12-01 ENCOUNTER — Other Ambulatory Visit: Payer: Self-pay | Admitting: Internal Medicine

## 2012-12-01 ENCOUNTER — Ambulatory Visit (HOSPITAL_COMMUNITY)
Admission: RE | Admit: 2012-12-01 | Discharge: 2012-12-01 | Disposition: A | Discharge status: Home / Self Care | Payer: No Typology Code available for payment source | Source: Ambulatory Visit | Attending: Internal Medicine | Admitting: Internal Medicine

## 2012-12-01 DIAGNOSIS — Z1231 Encounter for screening mammogram for malignant neoplasm of breast: Secondary | ICD-10-CM | POA: Insufficient documentation

## 2013-01-06 ENCOUNTER — Telehealth: Payer: Self-pay | Admitting: *Deleted

## 2013-01-06 NOTE — Telephone Encounter (Signed)
Pt called c/o of vaginal discharge with odor. Wants appt today. Suggest for pt to go to Urgent Care - prefers to wait till next week - appt 01/12/13 9:45AM Dr Manson Passey. No appts for today and closed for rest of week. Stanton Kidney Hersh Minney RN 01/06/13 10AM

## 2013-01-12 ENCOUNTER — Encounter: Payer: Self-pay | Admitting: Internal Medicine

## 2013-01-12 ENCOUNTER — Ambulatory Visit (INDEPENDENT_AMBULATORY_CARE_PROVIDER_SITE_OTHER): Payer: Self-pay | Admitting: Internal Medicine

## 2013-01-12 VITALS — BP 118/75 | HR 68 | Temp 97.0°F | Ht 64.5 in | Wt 193.4 lb

## 2013-01-12 DIAGNOSIS — M722 Plantar fascial fibromatosis: Secondary | ICD-10-CM

## 2013-01-12 DIAGNOSIS — H538 Other visual disturbances: Secondary | ICD-10-CM

## 2013-01-12 HISTORY — DX: Plantar fascial fibromatosis: M72.2

## 2013-01-12 MED ORDER — NAPROXEN 500 MG PO TABS: 500.0000 mg | ORAL_TABLET | Freq: Two times a day (BID) | ORAL | Status: DC

## 2013-01-12 MED ORDER — POLYVINYL ALCOHOL 1.4 % OP SOLN: 1.0000 [drp] | Freq: Two times a day (BID) | OPHTHALMIC | Status: DC

## 2013-01-12 MED ORDER — TRIAMCINOLONE ACETONIDE 0.025 % EX OINT: 1.0000 "application " | TOPICAL_OINTMENT | Freq: Two times a day (BID) | CUTANEOUS | Status: DC

## 2013-01-12 NOTE — Patient Instructions (Signed)
General Instructions: The pain in your left foot is due to a condition called Plantar Fasciitis, which is inflammation in the tendon of your foot.  If symptoms recur, use Naproxen, 1 tablet twice per day for 7 days.  For your blurry vision, we are referring you to ophthalmology for an eye glasses evaluation. -in the meantime, use liquid tears (either prescription of over-the-counter) for symptoms of dry eyes, which may be contributing to blurry vision  Please return for a follow-up visit in 6 months.   Treatment Goals:  Goals (1 Years of Data) as of 01/12/13         As of Today 11/26/12 11/12/12 11/07/12 11/07/12     Blood Pressure    . Blood Pressure < 140/90  118/75 132/78 127/81 157/96 157/94      Progress Toward Treatment Goals:  Treatment Goal 01/12/2013  Blood pressure at goal    Self Care Goals & Plans:  Self Care Goal 11/12/2012  Manage my medications take my medicines as prescribed; bring my medications to every visit; refill my medications on time; follow the sick day instructions if I am sick  Monitor my health keep track of my blood pressure; keep track of my weight  Eat healthy foods eat more vegetables; eat fruit for snacks and desserts; eat baked foods instead of fried foods; eat smaller portions  Be physically active take a walk every day; find an activity I enjoy  Meeting treatment goals -    No flowsheet data found.   Care Management & Community Referrals:  Referral 01/12/2013  Referrals made for care management support none needed  Referrals made to community resources -

## 2013-01-12 NOTE — Assessment & Plan Note (Signed)
The patient's left plantar foot pain likely represented plantar fasciitis.  It appears symptoms have nearly completely resolved.  No suspicion for fracture, given near complete resolution of symptoms and no preceding trauma.  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 -pt given prescription for naproxen 500 mg BID x7 days if symptoms recur

## 2013-01-12 NOTE — Progress Notes (Signed)
HPI The patient is a 47 y.o. female with a history of HTN, asthma, recurrent bacterial vaginosis, presenting for an acute visit for foot pain.  The patient notes a 1-week history of left foot pain, described as a "dull ache" on her left plantar surface near the ball of her foot.  She notes no trauma prior to the pain, and states she awoke with the pain one morning.  Symptoms "come and go", and the patient has taken ibuprofen for the pain, which helps some.  She notes occasional pain when walking on her foot, though this also comes and goes, and is not currently present.  The patient also notes that on her right foot, she has a painless "bump" on her medial 1st MTP, which she "just noticed".  The patient also notes gradually worsening vision in both eyes over the last 5 years since her last eye exam.  She notes that some days the blurry vision is worse, though improves with repeated blinking.  She has never worn glasses or contacts.  No pain, double vision, itching, dry eyes, blind spots, or foreign body sensation.  Last A1C = 4.7.  ROS: General: no fevers, chills, changes in weight, changes in appetite Skin: no rash HEENT: no hearing changes, sore throat Pulm: no dyspnea, coughing, wheezing CV: no chest pain, palpitations, shortness of breath Abd: no abdominal pain, nausea/vomiting, diarrhea/constipation GU: no dysuria, hematuria, polyuria Ext: no arthralgias, myalgias Neuro: no weakness, numbness, or tingling  Filed Vitals:   01/12/13 1002  BP: 118/75  Pulse: 68  Temp: 97 F (36.1 C)    PEX General: alert, cooperative, and in no apparent distress HEENT: pupils equal round and reactive to light, vision grossly intact, oropharynx clear and non-erythematous.  Visual acuity R eye 20/50 minus 3, L eye 20/50 minus 2. Conjunctivae without injection, and appear moist. Possible mild opacification of lens of left eye Neck: supple, no lymphadenopathy Lungs: clear to ascultation bilaterally,  normal work of respiration, no wheezes, rales, ronchi Heart: regular rate and rhythm, no murmurs, gallops, or rubs Abdomen: soft, non-tender, non-distended, normal bowel sounds Extremities: Left foot with minimal ttp of plantar fascia at insertion near plantar 5th MTP.  Right foot with painless medial displacement of 1st MTP Neurologic: alert & oriented X3, cranial nerves II-XII intact, strength grossly intact, sensation intact to light touch  Current Outpatient Prescriptions on File Prior to Visit  Medication Sig Dispense Refill  . albuterol (PROVENTIL HFA;VENTOLIN HFA) 108 (90 BASE) MCG/ACT inhaler Inhale 2 puffs into the lungs every 4 (four) hours as needed. For shortness of breath      . bisoprolol-hydrochlorothiazide (ZIAC) 5-6.25 MG per tablet Take 2 tablets by mouth daily. TAKE ONE TABLET BY MOUTH ONCE DAILY  60 tablet  11  . desonide (DESOWEN) 0.05 % ointment Apply 1 application topically 2 (two) times daily as needed. To affected area for eczema  60 g  11  . diclofenac sodium (VOLTAREN) 1 % GEL Apply 1 application topically 4 (four) times daily.  100 g  0  . naproxen sodium (ANAPROX) 220 MG tablet Take 220 mg by mouth 2 (two) times daily with a meal.       No current facility-administered medications on file prior to visit.    Assessment/Plan

## 2013-01-12 NOTE — Assessment & Plan Note (Signed)
Since blurry vision is bilateral and gradual in onset, this likely represents age-related worsening of vision over time.  On fundoscopic exam, I noticed a very slight opacification of the lens which may represent an early cataract.  Additionally, since symptoms appear to improve with blinking, dry eyes may be playing a role in patient symptoms. -prescribed liquid tears for dry eyes -will refer to optometrist/ophthalmologist for eye glasses evaluation/prescription

## 2013-01-12 NOTE — Progress Notes (Signed)
Case discussed with Dr. Brown at time of visit.  We reviewed the resident's history and exam and pertinent patient test results.  I agree with the assessment, diagnosis, and plan of care documented in the resident's note. 

## 2013-01-16 NOTE — Addendum Note (Signed)
Addended by: Hulan Fray on: 01/16/2013 06:46 PM   Modules accepted: Orders

## 2013-02-25 ENCOUNTER — Emergency Department (INDEPENDENT_AMBULATORY_CARE_PROVIDER_SITE_OTHER)
Admission: EM | Admit: 2013-02-25 | Discharge: 2013-02-25 | Disposition: A | Discharge status: Home / Self Care | Payer: No Typology Code available for payment source | Source: Home / Self Care | Attending: Family Medicine | Admitting: Family Medicine

## 2013-02-25 DIAGNOSIS — N760 Acute vaginitis: Secondary | ICD-10-CM

## 2013-02-25 DIAGNOSIS — A499 Bacterial infection, unspecified: Secondary | ICD-10-CM

## 2013-02-25 DIAGNOSIS — B9689 Other specified bacterial agents as the cause of diseases classified elsewhere: Secondary | ICD-10-CM

## 2013-02-25 MED ORDER — METRONIDAZOLE 500 MG PO TABS: 500.0000 mg | ORAL_TABLET | Freq: Two times a day (BID) | ORAL | Status: DC

## 2013-02-25 NOTE — Discharge Instructions (Signed)
Thank you for coming in today. Followup with your primary care Dr. Joellyn Rued metronidazole twice daily for one week. Do not take with alcohol  Bacterial Vaginosis Bacterial vaginosis is a vaginal infection that occurs when the normal balance of bacteria in the vagina is disrupted. It results from an overgrowth of certain bacteria. This is the most common vaginal infection in women of childbearing age. Treatment is important to prevent complications, especially in pregnant women, as it can cause a premature delivery. CAUSES  Bacterial vaginosis is caused by an increase in harmful bacteria that are normally present in smaller amounts in the vagina. Several different kinds of bacteria can cause bacterial vaginosis. However, the reason that the condition develops is not fully understood. RISK FACTORS Certain activities or behaviors can put you at an increased risk of developing bacterial vaginosis, including:  Having a new sex partner or multiple sex partners.  Douching.  Using an intrauterine device (IUD) for contraception. Women do not get bacterial vaginosis from toilet seats, bedding, swimming pools, or contact with objects around them. SIGNS AND SYMPTOMS  Some women with bacterial vaginosis have no signs or symptoms. Common symptoms include:  Grey vaginal discharge.  A fishlike odor with discharge, especially after sexual intercourse.  Itching or burning of the vagina and vulva.  Burning or pain with urination. DIAGNOSIS  Your health care provider will take a medical history and examine the vagina for signs of bacterial vaginosis. A sample of vaginal fluid may be taken. Your health care provider will look at this sample under a microscope to check for bacteria and abnormal cells. A vaginal pH test may also be done.  TREATMENT  Bacterial vaginosis may be treated with antibiotic medicines. These may be given in the form of a pill or a vaginal cream. A second round of antibiotics may be  prescribed if the condition comes back after treatment.  HOME CARE INSTRUCTIONS   Only take over-the-counter or prescription medicines as directed by your health care provider.  If antibiotic medicine was prescribed, take it as directed. Make sure you finish it even if you start to feel better.  Do not have sex until treatment is completed.  Tell all sexual partners that you have a vaginal infection. They should see their health care provider and be treated if they have problems, such as a mild rash or itching.  Practice safe sex by using condoms and only having one sex partner. SEEK MEDICAL CARE IF:   Your symptoms are not improving after 3 days of treatment.  You have increased discharge or pain.  You have a fever. MAKE SURE YOU:   Understand these instructions.  Will watch your condition.  Will get help right away if you are not doing well or get worse. FOR MORE INFORMATION  Centers for Disease Control and Prevention, Division of STD Prevention: [employer]raud[employer] American Sexual Health Association (ASHA): www.ashastd.org  Document Released: 01/01/2005 Document Revised: 10/22/2012 Document Reviewed: 08/13/2012 [facility] Patient Information 2014 [place].

## 2013-02-25 NOTE — ED Provider Notes (Signed)
Carrie Franco is a 48 y.o. female who presents to Urgent Care today for Fishy vaginal odor. Patient has had one week of vaginal odor. She denies any discharge irritation fevers chills nausea vomiting or diarrhea. She denies any abdominal pain. Symptoms are consistent with prior episodes of bacterial vaginosis which she gets frequently. She is in a long-term monogamous relationship. She [facility] and [facility] few [facility] ago like to avoid repeat testing if possible. She would like to followup with her primary care provider if needed for testing if not improving.    Past Medical History  Diagnosis Date  . High risk sexual behavior   . Hypertension   . Bacterial vaginitis     recurrent  . Recurrent UTI   . Genital herpes   . Recurrent boils   . External hemorrhoids with complication   . Allergic rhinitis   . Chronic constipation   . History of cocaine abuse 1992  . History of tobacco abuse 1999  . Bilateral carpal tunnel syndrome   . Acne   . Hyperlipidemia   . Asthma    History  Substance Use Topics  . Smoking status: Former Smoker    Types: Cigarettes  . Smokeless tobacco: Not on file     Comment: quit 56yrs ago  . Alcohol Use: No   ROS as above Medications: No current facility-administered medications for this encounter.   Current Outpatient Prescriptions  Medication Sig Dispense Refill  . albuterol (PROVENTIL HFA;VENTOLIN HFA) 108 (90 BASE) MCG/ACT inhaler Inhale 2 puffs into the lungs every 4 (four) hours as needed. For shortness of breath      . bisoprolol-hydrochlorothiazide (ZIAC) 5-6.25 MG per tablet Take 2 tablets by mouth daily. TAKE ONE TABLET BY MOUTH ONCE DAILY  60 tablet  11  . desonide (DESOWEN) 0.05 % ointment Apply 1 application topically 2 (two) times daily as needed. To affected area for eczema  60 g  11  . diclofenac sodium (VOLTAREN) 1 % GEL Apply 1 application topically 4 (four) times daily.  100 g  0  . metroNIDAZOLE  (FLAGYL) 500 MG tablet Take 1 tablet (500 mg total) by mouth 2 (two) times daily.  14 tablet  0  . naproxen (NAPROSYN) 500 MG tablet Take 1 tablet (500 mg total) by mouth 2 (two) times daily with a meal.  14 tablet  0  . naproxen sodium (ANAPROX) 220 MG tablet Take 220 mg by mouth 2 (two) times daily with a meal.      . polyvinyl alcohol (LIQUIFILM TEARS) 1.4 % ophthalmic solution Place 1 drop into both eyes 2 (two) times daily.  15 mL  0  . triamcinolone (KENALOG) 0.025 % ointment Apply 1 application topically 2 (two) times daily. To dry skin on back and torso  80 g  0    Exam:  BP 132/80  Pulse 72  Temp(Src) 98.1 F (36.7 C) (Oral)  Resp 18  SpO2 100% Gen: Well NAD HEENT: EOMI,  MMM Lungs: Normal work of breathing. CTABL Heart: RRR no MRG Abd: NABS, Soft. NT, ND Exts: Brisk capillary refill, warm and well perfused.    Assessment and Plan: 48 y.o. female with bacterial vaginosis. Exam deferred by patient. Plan to treat empirically with metronidazole. Followup with primary care provider.  Discussed warning signs or symptoms. Please see discharge instructions. Patient expresses understanding.    Gregor Hams, MD 02/25/13 [phone]

## 2013-03-05 ENCOUNTER — Ambulatory Visit: Payer: Self-pay | Admitting: Unknown Physician Specialty

## 2013-03-06 LAB — PATHOLOGY REPORT

## 2013-03-17 ENCOUNTER — Ambulatory Visit: Payer: No Typology Code available for payment source | Admitting: Internal Medicine

## 2013-03-19 ENCOUNTER — Ambulatory Visit (INDEPENDENT_AMBULATORY_CARE_PROVIDER_SITE_OTHER): Payer: No Typology Code available for payment source | Admitting: Internal Medicine

## 2013-03-19 ENCOUNTER — Encounter: Payer: Self-pay | Admitting: Internal Medicine

## 2013-03-19 VITALS — BP 115/76 | HR 80 | Temp 97.7°F | Wt 193.7 lb

## 2013-03-19 DIAGNOSIS — K219 Gastro-esophageal reflux disease without esophagitis: Secondary | ICD-10-CM

## 2013-03-19 DIAGNOSIS — J309 Allergic rhinitis, unspecified: Secondary | ICD-10-CM

## 2013-03-19 DIAGNOSIS — N760 Acute vaginitis: Secondary | ICD-10-CM

## 2013-03-19 MED ORDER — METRONIDAZOLE 0.75 % VA GEL: 1.0000 | VAGINAL | Status: DC

## 2013-03-19 MED ORDER — OMEPRAZOLE MAGNESIUM 20 MG PO TBEC: 20.0000 mg | DELAYED_RELEASE_TABLET | Freq: Every day | ORAL | Status: DC

## 2013-03-19 MED ORDER — LORATADINE 10 MG PO TABS: 10.0000 mg | ORAL_TABLET | Freq: Every day | ORAL | Status: DC | PRN

## 2013-03-19 NOTE — Patient Instructions (Addendum)
It was nice to meet you today, Carrie Franco. We have prescribed long-tem therapy for your recurrent vaginitis. We have also prescribed a medication for your GERD and refilled the allergy medication Use as instructed and review the attached information below.   Bacterial Vaginosis Bacterial vaginosis is a vaginal infection that occurs when the normal balance of bacteria in the vagina is disrupted. It results from an overgrowth of certain bacteria. This is the most common vaginal infection in women of childbearing age. Treatment is important to prevent complications, especially in pregnant women, as it can cause a premature delivery. CAUSES  Bacterial vaginosis is caused by an increase in harmful bacteria that are normally present in smaller amounts in the vagina. Several different kinds of bacteria can cause bacterial vaginosis. However, the reason that the condition develops is not fully understood. RISK FACTORS Certain activities or behaviors can put you at an increased risk of developing bacterial vaginosis, including:  Having a new sex partner or multiple sex partners.  Douching.  Using an intrauterine device (IUD) for contraception. Women do not get bacterial vaginosis from toilet seats, bedding, swimming pools, or contact with objects around them. SIGNS AND SYMPTOMS  Some women with bacterial vaginosis have no signs or symptoms. Common symptoms include:  Grey vaginal discharge.  A fishlike odor with discharge, especially after sexual intercourse.  Itching or burning of the vagina and vulva.  Burning or pain with urination. DIAGNOSIS  Your health care provider will take a medical history and examine the vagina for signs of bacterial vaginosis. A sample of vaginal fluid may be taken. Your health care provider will look at this sample under a microscope to check for bacteria and abnormal cells. A vaginal pH test may also be done.  TREATMENT  Bacterial vaginosis may be treated with  antibiotic medicines. These may be given in the form of a pill or a vaginal cream. A second round of antibiotics may be prescribed if the condition comes back after treatment.  HOME CARE INSTRUCTIONS   Only take over-the-counter or prescription medicines as directed by your health care provider.  If antibiotic medicine was prescribed, take it as directed. Make sure you finish it even if you start to feel better.  Do not have sex until treatment is completed.  Tell all sexual partners that you have a vaginal infection. They should see their health care provider and be treated if they have problems, such as a mild rash or itching.  Practice safe sex by using condoms and only having one sex partner. SEEK MEDICAL CARE IF:   Your symptoms are not improving after 3 days of treatment.  You have increased discharge or pain.  You have a fever. MAKE SURE YOU:   Understand these instructions.  Will watch your condition.  Will get help right away if you are not doing well or get worse. FOR MORE INFORMATION  Centers for Disease Control and Prevention, Division of STD Prevention: [employer] American Sexual Health Association (ASHA): www.ashastd.org  Document Released: 01/01/2005 Document Revised: 10/22/2012 Document Reviewed: 08/13/2012 [facility] Patient Information 2014 [place].   Gastroesophageal Reflux Disease, Adult Gastroesophageal reflux disease (GERD) happens when acid from your stomach flows up into the esophagus. When acid comes in contact with the esophagus, the acid causes soreness (inflammation) in the esophagus. Over time, GERD may create small holes (ulcers) in the lining of the esophagus. CAUSES   Increased body weight. This puts pressure on the stomach, making acid rise from the stomach into the  esophagus.  Smoking. This increases acid production in the stomach.  Drinking alcohol. This causes decreased pressure in the lower esophageal sphincter (valve or ring of  muscle between the esophagus and stomach), allowing acid from the stomach into the esophagus.  Late evening meals and a full stomach. This increases pressure and acid production in the stomach.  A malformed lower esophageal sphincter. Sometimes, no cause is found. SYMPTOMS   Burning pain in the lower part of the mid-chest behind the breastbone and in the mid-stomach area. This may occur twice a week or more often.  Trouble swallowing.  Sore throat.  Dry cough.  Asthma-like symptoms including chest tightness, shortness of breath, or wheezing. DIAGNOSIS  Your caregiver may be able to diagnose GERD based on your symptoms. In some cases, X-rays and other tests may be done to check for complications or to check the condition of your stomach and esophagus. TREATMENT  Your caregiver may recommend over-the-counter or prescription medicines to help decrease acid production. Ask your caregiver before starting or adding any new medicines.  HOME CARE INSTRUCTIONS   Change the factors that you can control. Ask your caregiver for guidance concerning weight loss, quitting smoking, and alcohol consumption.  Avoid foods and drinks that make your symptoms worse, such as:  Caffeine or alcoholic drinks.  Chocolate.  Peppermint or mint flavorings.  Garlic and onions.  Spicy foods.  Citrus fruits, such as oranges, lemons, or limes.  Tomato-based foods such as sauce, chili, salsa, and pizza.  Fried and fatty foods.  Avoid lying down for the 3 hours prior to your bedtime or prior to taking a nap.  Eat small, frequent meals instead of large meals.  Wear loose-fitting clothing. Do not wear anything tight around your waist that causes pressure on your stomach.  Raise the head of your bed 6 to 8 inches with wood blocks to help you sleep. Extra pillows will not help.  Only take over-the-counter or prescription medicines for pain, discomfort, or fever as directed by your caregiver.  Do not  take aspirin, ibuprofen, or other nonsteroidal anti-inflammatory drugs (NSAIDs). SEEK IMMEDIATE MEDICAL CARE IF:   You have pain in your arms, neck, jaw, teeth, or back.  Your pain increases or changes in intensity or duration.  You develop nausea, vomiting, or sweating (diaphoresis).  You develop shortness of breath, or you faint.  Your vomit is green, yellow, black, or looks like coffee grounds or blood.  Your stool is red, bloody, or black. These symptoms could be signs of other problems, such as heart disease, gastric bleeding, or esophageal bleeding. MAKE SURE YOU:   Understand these instructions.  Will watch your condition.  Will get help right away if you are not doing well or get worse. Document Released: 10/11/2004 Document Revised: 03/26/2011 Document Reviewed: 07/21/2010 [facility] Patient Information 2014 [place], [place].

## 2013-03-19 NOTE — Assessment & Plan Note (Signed)
We start metronidazole vag gel twice a week for 4-8 weeks with next occurrence

## 2013-03-19 NOTE — Assessment & Plan Note (Signed)
Refilled Claritin.

## 2013-03-19 NOTE — Progress Notes (Signed)
   Subjective:    Patient ID: Carrie Franco, female    DOB: 1965/06/03, 48 y.o.   MRN: [id number]  HPI  Pt presents for evaluation of recurrent bacterial vaginosis and complaints of heartburn.  States that she has had BV several times over the past year despite proper hygiene and is inquiring about prophylasix specifically stating that she had been given ciprofloxacin to use prior to intercourse.  Denies current symptoms including no odor, dysuria, pelvic pain, or vaginal discharge. Would like a refill of claritin as well.  Review of Systems  Constitutional: Negative for fever.  Respiratory: Negative for shortness of breath.   Cardiovascular: Negative for chest pain and leg swelling.  Gastrointestinal: Negative.   Genitourinary: Negative for dysuria, hematuria, vaginal bleeding, vaginal discharge, menstrual problem and pelvic pain.  Allergic/Immunologic:       Allergic to PCN  Neurological: Negative.   Hematological: Does not bruise/bleed easily.  Psychiatric/Behavioral: Negative.        Objective:   Physical Exam  Constitutional: She is oriented to person, place, and time. She appears well-developed and well-nourished. No distress.  HENT:  Head: Normocephalic and atraumatic.  Eyes: Conjunctivae and EOM are normal. Pupils are equal, round, and reactive to light.  Neck: Normal range of motion. Neck supple. No thyromegaly present.  Cardiovascular: Normal rate, regular rhythm and normal heart sounds.   Pulmonary/Chest: Effort normal and breath sounds normal.  Abdominal: Soft. Bowel sounds are normal. There is no tenderness.  Musculoskeletal: Normal range of motion. She exhibits no edema.  Neurological: She is alert and oriented to person, place, and time.  Skin: Skin is warm and dry.  Psychiatric: She has a normal mood and affect.          Assessment & Plan:  See detailed problem charting:

## 2013-03-19 NOTE — Assessment & Plan Note (Signed)
We start on trial on omeprazole.

## 2013-03-23 NOTE — Progress Notes (Signed)
Case discussed with Dr. Schooler soon after the resident saw the patient.  We reviewed the resident's history and exam and pertinent patient test results.  I agree with the assessment, diagnosis, and plan of care documented in the resident's note. 

## 2013-05-07 ENCOUNTER — Ambulatory Visit (INDEPENDENT_AMBULATORY_CARE_PROVIDER_SITE_OTHER): Payer: No Typology Code available for payment source | Admitting: Internal Medicine

## 2013-05-07 ENCOUNTER — Encounter: Payer: Self-pay | Admitting: Internal Medicine

## 2013-05-07 VITALS — BP 130/84 | HR 63 | Temp 97.7°F | Ht 65.0 in | Wt 200.0 lb

## 2013-05-07 DIAGNOSIS — A499 Bacterial infection, unspecified: Secondary | ICD-10-CM

## 2013-05-07 DIAGNOSIS — G5603 Carpal tunnel syndrome, bilateral upper limbs: Secondary | ICD-10-CM

## 2013-05-07 DIAGNOSIS — G5600 Carpal tunnel syndrome, unspecified upper limb: Secondary | ICD-10-CM

## 2013-05-07 DIAGNOSIS — N760 Acute vaginitis: Secondary | ICD-10-CM

## 2013-05-07 DIAGNOSIS — K219 Gastro-esophageal reflux disease without esophagitis: Secondary | ICD-10-CM

## 2013-05-07 DIAGNOSIS — B9689 Other specified bacterial agents as the cause of diseases classified elsewhere: Secondary | ICD-10-CM

## 2013-05-07 DIAGNOSIS — Z0000 Encounter for general adult medical examination without abnormal findings: Secondary | ICD-10-CM

## 2013-05-07 MED ORDER — NAPROXEN 500 MG PO TABS: 500.0000 mg | ORAL_TABLET | Freq: Two times a day (BID) | ORAL | Status: DC

## 2013-05-07 NOTE — Assessment & Plan Note (Signed)
Patient is complying with Flagyl gel twice weekly. Today she reports a little bit of vaginal discharge, but no odor and no pain or irritation. She asks for oral Flagyl, but I told her a better plan would be to wait and see if she develops more characteristic symptoms. She will call the clinic if she develops symptoms of vaginitis.

## 2013-05-07 NOTE — Assessment & Plan Note (Signed)
Symptoms improved with PPI therapy, patient is now doing a trial off the medicine with no recurrence of symptoms.

## 2013-05-07 NOTE — Assessment & Plan Note (Signed)
Patient reports she had a colonoscopy done in [place] 2 months ago and they "removed 7 tumors." - I have asked the patient to call her gastroenterologist and ask them to fax her records to the clinic

## 2013-05-07 NOTE — Assessment & Plan Note (Addendum)
Patient wrist symptoms most likely represent bilateral carpal tunnel syndrome (Phalen's maneuver positive), but she may have a recurrence of her de Quervain's tenosynovitis given a positive Finkelstein's test. Or, it could be a combination. Workup has been performed in the past to rule out hypothyroidism, pregnancy, diabetes. She is overweight which is a risk factor for carpal tunnel syndrome. - Referral placed back to sports medicine for steroid injections versus surgical referral - Naprosyn 500 mg twice a day #30 ordered for pain control - Patient encouraged to continue using her wrist splints - Followup as needed

## 2013-05-07 NOTE — Progress Notes (Signed)
Subjective:    Patient ID: Carrie Franco, female    DOB: 18-Jan-1965, 48 y.o.   MRN: [id number]  HPI  Carrie Franco  is a 48 y.o. female PMH HTN, asthma, recurrent bacterial vaginosis who presents for an acute visit for wrist pain.  Patient reports pain and swelling in her wrists bilaterally. The pain is constant but worse at night. She has trouble gripping objects. She endorses numbness in her thumb and pointer finger bilaterally. She has tried ibuprofen and Aleve with no relief. She has braces for both wrists and has been wearing them at night with no relief. 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 She uses her hands all day. She has a history of bilateral carpal tunnel syndrome and de Quervain's tenosynovitis. She used to follow in Sports Medicine where she underwent ultrasound guided steroid injection of the right first extensor compartment in 2013.  Patient has a history of recurrent bacterial vaginosis. Last wet prep on 10/03/2012 was negative for this, but she reports [facility] since [facility]. On March 5 she was prescribed Flagyl gel to be administered vaginally twice a week for 2 months as prophylaxis against BV. She is not sure if it is working and did notice small amount of discharge today, but no pain or odor.  GERD symptoms improved dramatically on omeprazole. She read the package insert and started a trial off the medicine 3 weeks ago, with no recurrence of symptoms.   Current Outpatient Prescriptions on File Prior to Visit  Medication Sig Dispense Refill  . albuterol (PROVENTIL HFA;VENTOLIN HFA) 108 (90 BASE) MCG/ACT inhaler Inhale 2 puffs into the lungs every 4 (four) hours as needed. For shortness of breath      . bisoprolol-hydrochlorothiazide (ZIAC) 5-6.25 MG per tablet Take 2 tablets by mouth daily. TAKE ONE TABLET BY MOUTH ONCE DAILY  60 tablet  11  . desonide (DESOWEN) 0.05 % ointment Apply 1 application topically 2  (two) times daily as needed. To affected area for eczema  60 g  11  . diclofenac sodium (VOLTAREN) 1 % GEL Apply 1 application topically 4 (four) times daily.  100 g  0  . loratadine (CLARITIN) 10 MG tablet Take 1 tablet (10 mg total) by mouth daily as needed. For allergies  30 tablet  3  . metroNIDAZOLE (FLAGYL) 500 MG tablet Take 1 tablet (500 mg total) by mouth 2 (two) times daily.  14 tablet  0  . metroNIDAZOLE (METROGEL) 0.75 % vaginal gel Place 1 Applicatorful vaginally 2 (two) times a week. For 4 months  70 g  1  . naproxen (NAPROSYN) 500 MG tablet Take 1 tablet (500 mg total) by mouth 2 (two) times daily with a meal.  14 tablet  0  . naproxen sodium (ANAPROX) 220 MG tablet Take 220 mg by mouth 2 (two) times daily with a meal.      . omeprazole (PRILOSEC OTC) 20 MG tablet Take 1 tablet (20 mg total) by mouth daily.  30 tablet  1  . polyvinyl alcohol (LIQUIFILM TEARS) 1.4 % ophthalmic solution Place 1 drop into both eyes 2 (two) times daily.  15 mL  0  . triamcinolone (KENALOG) 0.025 % ointment Apply 1 application topically 2 (two) times daily. To dry skin on back and torso  80 g  0    Review of Systems  Constitutional: Negative for fever and chills.  Respiratory: Negative for shortness of breath.  Cardiovascular: Negative for chest pain.  Musculoskeletal: Positive for arthralgias (bilateral wrists) and joint swelling (Bilateral wrists).  Skin: Negative for rash.  Neurological: Positive for weakness (grip strength weakness bilaterally) and numbness (Thumb and forefinger bilaterally). Negative for dizziness and headaches.      Objective:   Physical Exam  Constitutional: She is oriented to person, place, and time. She appears well-developed and well-nourished. No distress.  HENT:  Head: Normocephalic and atraumatic.  Eyes: Conjunctivae and EOM are normal. Pupils are equal, round, and reactive to light.  Neck: Normal range of motion. Neck supple.  Cardiovascular: Normal rate, regular  rhythm and normal heart sounds.   Pulmonary/Chest: Effort normal and breath sounds normal.  Abdominal: Soft. Bowel sounds are normal.  Musculoskeletal: Normal range of motion. She exhibits no edema and no tenderness.  Patient endorses pain with Finkelstein's test bilaterally. Tinel test negative bilaterally. However Phalen maneuver is positive bilaterally.  Neurological: She is alert and oriented to person, place, and time. No cranial nerve deficit.  Skin: Skin is warm and dry.  Psychiatric: She has a normal mood and affect.    Filed Vitals:   05/07/13 1543  BP: 130/84  Pulse: 63  Temp: 97.7 F (36.5 C)      Assessment & Plan:   Please see problem based charting.

## 2013-05-07 NOTE — Patient Instructions (Signed)
Thanks for your visit. - Please take Naprosyn 500 mg twice a day with a meal for your wrist pain. - I placed a referral for you to go back to sports medicine, they can do further joint injections or talk to you about surgery. - Please continue to use your wrist splints. - Please call your gastroenterologist and ask that your colonoscopy records be faxed here. - Return to see [place] as needed or in 3 months.

## 2013-05-10 NOTE — Progress Notes (Signed)
Case discussed with Dr. Cater at time of visit.  We reviewed the resident's history and exam and pertinent patient test results.  I agree with the assessment, diagnosis, and plan of care documented in the resident's note. 

## 2013-05-20 ENCOUNTER — Ambulatory Visit: Payer: No Typology Code available for payment source | Admitting: Emergency Medicine

## 2013-05-22 ENCOUNTER — Telehealth: Payer: Self-pay | Admitting: *Deleted

## 2013-05-22 NOTE — Telephone Encounter (Signed)
CALLED PATIENT AND LEFT VOICE MESSAGE FOR PATIENT TO CALL.  NEED TO KNOW IF HER INSURANCE ([employer] ) WILL COVER FOR EYE EXAM. WAITING TO HEAR FROM THE PATIENT BEFORE I MAKE THIS APPOINTMENT. LELA STURDIVANT NTII  1:58PM

## 2013-05-27 ENCOUNTER — Ambulatory Visit: Payer: No Typology Code available for payment source | Admitting: Emergency Medicine

## 2013-05-27 NOTE — Addendum Note (Signed)
Addended by: Hulan Fray on: 05/27/2013 06:45 PM   Modules accepted: Orders

## 2013-05-29 ENCOUNTER — Ambulatory Visit: Payer: No Typology Code available for payment source | Admitting: Internal Medicine

## 2013-06-01 ENCOUNTER — Ambulatory Visit: Payer: No Typology Code available for payment source | Admitting: Internal Medicine

## 2013-06-04 ENCOUNTER — Ambulatory Visit (INDEPENDENT_AMBULATORY_CARE_PROVIDER_SITE_OTHER): Payer: No Typology Code available for payment source | Admitting: Internal Medicine

## 2013-06-04 ENCOUNTER — Encounter: Payer: Self-pay | Admitting: Internal Medicine

## 2013-06-04 VITALS — BP 150/79 | HR 65 | Temp 98.7°F | Wt 197.8 lb

## 2013-06-04 DIAGNOSIS — M7630 Iliotibial band syndrome, unspecified leg: Secondary | ICD-10-CM | POA: Insufficient documentation

## 2013-06-04 DIAGNOSIS — L259 Unspecified contact dermatitis, unspecified cause: Secondary | ICD-10-CM

## 2013-06-04 DIAGNOSIS — M2420 Disorder of ligament, unspecified site: Secondary | ICD-10-CM

## 2013-06-04 DIAGNOSIS — M629 Disorder of muscle, unspecified: Secondary | ICD-10-CM

## 2013-06-04 DIAGNOSIS — L309 Dermatitis, unspecified: Secondary | ICD-10-CM

## 2013-06-04 DIAGNOSIS — I10 Essential (primary) hypertension: Secondary | ICD-10-CM

## 2013-06-04 LAB — BASIC METABOLIC PANEL
BUN: 14 mg/dL (ref 6–23)
CO2: 27 mEq/L (ref 19–32)
Calcium: 9.4 mg/dL (ref 8.4–10.5)
Chloride: 102 mEq/L (ref 96–112)
Creat: 0.71 mg/dL (ref 0.50–1.10)
Glucose, Bld: 88 mg/dL (ref 70–99)
Potassium: 3.6 mEq/L (ref 3.5–5.3)
SODIUM: 138 meq/L (ref 135–145)

## 2013-06-04 MED ORDER — CYCLOBENZAPRINE HCL 5 MG PO TABS: 5.0000 mg | ORAL_TABLET | Freq: Three times a day (TID) | ORAL | Status: AC | PRN

## 2013-06-04 MED ORDER — IBUPROFEN 800 MG PO TABS: 800.0000 mg | ORAL_TABLET | Freq: Three times a day (TID) | ORAL | Status: DC

## 2013-06-04 MED ORDER — TRIAMCINOLONE ACETONIDE 0.025 % EX OINT
1.0000 | TOPICAL_OINTMENT | Freq: Two times a day (BID) | CUTANEOUS | Status: DC
Start: 2013-06-04 — End: 2013-06-04

## 2013-06-04 MED ORDER — TRIAMCINOLONE ACETONIDE 0.025 % EX OINT: 1.0000 "application " | TOPICAL_OINTMENT | Freq: Two times a day (BID) | CUTANEOUS | Status: DC

## 2013-06-04 NOTE — Assessment & Plan Note (Signed)
Chronic, has been bothersome on back  -Refilled triamcinolone cream

## 2013-06-04 NOTE — Assessment & Plan Note (Signed)
Classic presentation in this runner/squatter.  Ongoing for 3 weeks.  Naproxen ineffective.  -Ibuprofen 800 TIDWC -PT eval and treat

## 2013-06-04 NOTE — Progress Notes (Signed)
Case discussed with Dr. Sharda soon after the resident saw the patient.  We reviewed the resident's history and exam and pertinent patient test results.  I agree with the assessment, diagnosis, and plan of care documented in the resident's note. 

## 2013-06-04 NOTE — Progress Notes (Signed)
Subjective:   Patient ID: Carrie Franco female   DOB: [birth year] 48 y.o.   MRN: [id number]  Chief Complaint  Patient presents with  . Knee Pain    HPI: Carrie Franco SEBASTIAN DZIK is a 48 y.o. woman with history of HTN and asthma who presents for knee pain.  Most recently seen in clinic on 05/07/13 for b/l wrist pain - referred to sports med for steroid injection, naproxen 500 BID for pain.  Right knee pain with swelling.  Has been going on for 3 weeks.  Thought she pulled a muscle.  No trauma/inciting event - on further questioning, jumped off truck a few days prior, but no immediate pain from that.  No history of right knee problems.  Worse with walking too much or significant extension of flexion of knee.  Unclear about alleviating factors, not swollen in the morning, swollen through the day.  Tried ibuprofen 800mg  helped a little bit.  Has also tried heating pad, no relief.  Ice made it ache worse.  Described like a strain.  Has actually been exercising less - tries to do squats daily, hasn't done it in 2 months.  10+/10 at worst, now 8/10.  Best is 5/10.    No recent travel.  No immobility.  No hormone supplementation.    Review of Systems: Constitutional: Denies fever, chills, diaphoresis, appetite change and fatigue.  HEENT: Denies photophobia, eye pain, redness, hearing loss, ear pain, congestion, sore throat, rhinorrhea, sneezing, mouth sores, trouble swallowing, neck pain, neck stiffness and tinnitus.  Respiratory: Denies SOB, DOE, cough, chest tightness, and wheezing.  Cardiovascular: Denies chest pain, palpitations  Gastrointestinal: Denies nausea, vomiting, abdominal pain, diarrhea, blood in stool and abdominal distention. +chronically constipatied Genitourinary: Denies dysuria, urgency, frequency, hematuria, flank pain and difficulty urinating.  Musculoskeletal: Denies back pain and gait problem. No falls. Skin: Denies pallor, rash and wound.  Neurological: Denies dizziness, seizures,  syncope, weakness, lightheadedness, numbness and headaches.  Hematological: No s/s of bleeding   Past Medical History  Diagnosis Date  . High risk sexual behavior   . Hypertension   . Bacterial vaginitis     recurrent  . Recurrent UTI   . Genital herpes   . Recurrent boils   . External hemorrhoids with complication   . Allergic rhinitis   . Chronic constipation   . History of cocaine abuse 1992  . History of tobacco abuse 1999  . Bilateral carpal tunnel syndrome   . Acne   . Hyperlipidemia   . Asthma    Current Outpatient Prescriptions  Medication Sig Dispense Refill  . albuterol (PROVENTIL HFA;VENTOLIN HFA) 108 (90 BASE) MCG/ACT inhaler Inhale 2 puffs into the lungs every 4 (four) hours as needed. For shortness of breath      . bisoprolol-hydrochlorothiazide (ZIAC) 5-6.25 MG per tablet Take 2 tablets by mouth daily. TAKE ONE TABLET BY MOUTH ONCE DAILY  60 tablet  11  . diclofenac sodium (VOLTAREN) 1 % GEL Apply 1 application topically 4 (four) times daily.  100 g  0  . loratadine (CLARITIN) 10 MG tablet Take 1 tablet (10 mg total) by mouth daily as needed. For allergies  30 tablet  3  . metroNIDAZOLE (METROGEL) 0.75 % vaginal gel Place 1 Applicatorful vaginally 2 (two) times a week. For 4 months  70 g  1  . naproxen (NAPROSYN) 500 MG tablet Take 1 tablet (500 mg total) by mouth 2 (two) times daily with a meal.  30 tablet  0  .  naproxen sodium (ANAPROX) 220 MG tablet Take 220 mg by mouth 2 (two) times daily with a meal.      . omeprazole (PRILOSEC OTC) 20 MG tablet Take 1 tablet (20 mg total) by mouth daily.  30 tablet  1  . polyvinyl alcohol (LIQUIFILM TEARS) 1.4 % ophthalmic solution Place 1 drop into both eyes 2 (two) times daily.  15 mL  0  . triamcinolone (KENALOG) 0.025 % ointment Apply 1 application topically 2 (two) times daily. To dry skin on back and torso  80 g  0   No current facility-administered medications for this visit.   Family History  Problem Relation Age of  Onset  . Cancer Mother   . Diabetes Other    History   Social History  . Marital Status: Single    Spouse Name: N/A    Number of Children: N/A  . Years of Education: N/A   Social History Main Topics  . Smoking status: Former Smoker    Types: Cigarettes  . Smokeless tobacco: None     Comment: quit 57yrs ago  . Alcohol Use: No  . Drug Use: No  . Sexual Activity: Yes    Birth Control/ Protection: None   Other Topics Concern  . None   Social History Narrative    The patient works at a daycare center, she completed high school, is single    Objective:  Physical Exam: Filed Vitals:   06/04/13 0919  BP: 150/79  Pulse: 65  Temp: 98.7 F (37.1 C)  TempSrc: Oral  Weight: 197 lb 12.8 oz (89.721 kg)  SpO2: 94%   General:pleasant, appears as stated age HEENT: PERRL, EOMI, no scleral icterus Cardiac: RRR, no rubs, murmurs or gallops Pulm: clear to auscultation bilaterally, moving normal volumes of air Abd: soft, nontender, nondistended, BS normoactive Ext: warm and well perfused, no pedal edema Neuro: alert and oriented X3, cranial nerves II-XII grossly intact MSK: tenderness and muscular strain along right IT band with b/l knee crepitus on ballotment, mild swelling of right knee, no erythema, full ROM of right knee  Assessment & Plan:  Case and care discussed with Dr. Donna Christen.  Please see problem oriented charting for further details.

## 2013-06-04 NOTE — Patient Instructions (Addendum)
-  For pain take Ibuprofen 800mg  three times daily scheduled for 7-10 days, and flexeril as needed  -I am referring you to physical therapy since this has been on going for 3 weeks  -I also refilled your triamcinolone for your eczema  Please be sure to bring all of your medications with you to every visit.  Should you have any new or worsening symptoms, please be sure to call the clinic at [phone].   Iliotibial Band Syndrome Iliotibial band syndrome is pain in the outer, lower thigh. The pain is caused by an inflammation of the iliotibial band. This is a band of thick fibrous tissue that runs down the outside of the thigh. The iliotibial band begins at the hip. It extends to the outer side of the shin bone (tibia) just below the knee joint. The band works with the thigh muscles. Together they provide stability to the outside of the knee joint. Iliotibial band syndrome occurs when there is inflammation to this band of tissue. This is typically due to over use and not due to an injury. The irritation usually occurs over the outside of the knee joint, at the the end of the thigh bone (femur). The iliotibial band crosses bone and muscle at this point. Between these structures is a cushioning sac (bursa). The bursa should make possible a smooth gliding motion. However, when inflamed, the iliotibial band does not glide easily. When inflamed, there is pain with motion of the knee. Usually the pain worsens with continued movement and the pain goes away with rest. This problem usually arises when there is a sudden increase in sports activities involving your legs. Running, and playing soccer or basketball are examples of activities causing this. Others who are prone to iliotibial band syndrome include individuals with mechanical problems such as leg length differences, abnormality of walking, bowed legs etc. HOME CARE INSTRUCTIONS   Apply ice to the injured area:  Put ice in a plastic bag.  Place a towel  between your skin and the bag.  Leave the ice on for 20 minutes, 2 3 times a day.  Limit excessive training or eliminate training until pain goes away.  While pain is present, you may use gentle range of motion. Do not resume regular use until instructed by your health care provider. Begin use gradually. Do not increase activity to the point of pain. If pain does develop, decrease activity and continue the above measures. Gradually increase activities that do not cause discomfort. Do this until you finally achieve normal use.  Perform low-impact activities while pain is present. Wear proper footwear.  Only take over-the-counter or prescription medicines for pain, discomfort, or fever as directed by your health care provider. SEEK MEDICAL CARE IF:   Your pain increases or pain is not controlled with medications.  You develop new, unexplained symptoms, or an increase of the symptoms that brought you to your health care provider.  Your pain and symptoms are not improving or are getting worse. Document Released: 06/23/2001 Document Revised: 10/22/2012 Document Reviewed: 07/31/2012 [facility] Patient Information 2014 [place], [place].

## 2013-06-04 NOTE — Assessment & Plan Note (Signed)
BP Readings from Last 3 Encounters:  06/04/13 150/79  05/07/13 130/84  03/19/13 115/76    Lab Results  Component Value Date   NA 141 05/15/2012   K 3.7 05/15/2012   CREATININE 0.76 05/15/2012    Assessment: Blood pressure control:  mildly elevated Progress toward BP goal:   deteriorated, stress this morning  Plan: Medications:  continue current medications - bisoprolol-hztz 5-6.25, has been controlled at last visit, no change today Other plans: BMET today

## 2013-06-09 ENCOUNTER — Ambulatory Visit (INDEPENDENT_AMBULATORY_CARE_PROVIDER_SITE_OTHER): Payer: No Typology Code available for payment source | Admitting: Internal Medicine

## 2013-06-09 ENCOUNTER — Encounter: Payer: Self-pay | Admitting: Internal Medicine

## 2013-06-09 ENCOUNTER — Ambulatory Visit (HOSPITAL_COMMUNITY)
Admission: RE | Admit: 2013-06-09 | Discharge: 2013-06-09 | Disposition: A | Discharge status: Home / Self Care | Payer: No Typology Code available for payment source | Source: Ambulatory Visit | Attending: Internal Medicine | Admitting: Internal Medicine

## 2013-06-09 VITALS — BP 150/88 | HR 67 | Wt 197.5 lb

## 2013-06-09 DIAGNOSIS — M25561 Pain in right knee: Secondary | ICD-10-CM

## 2013-06-09 DIAGNOSIS — M25562 Pain in left knee: Secondary | ICD-10-CM

## 2013-06-09 DIAGNOSIS — M7630 Iliotibial band syndrome, unspecified leg: Secondary | ICD-10-CM

## 2013-06-09 DIAGNOSIS — M25569 Pain in unspecified knee: Secondary | ICD-10-CM | POA: Insufficient documentation

## 2013-06-09 DIAGNOSIS — N760 Acute vaginitis: Secondary | ICD-10-CM

## 2013-06-09 DIAGNOSIS — I10 Essential (primary) hypertension: Secondary | ICD-10-CM

## 2013-06-09 HISTORY — DX: Pain in left knee: M25.561

## 2013-06-09 MED ORDER — IBUPROFEN 800 MG PO TABS: 800.0000 mg | ORAL_TABLET | Freq: Three times a day (TID) | ORAL | Status: DC

## 2013-06-09 MED ORDER — METRONIDAZOLE 0.75 % VA GEL: 1.0000 | VAGINAL | Status: DC

## 2013-06-09 NOTE — Progress Notes (Signed)
Subjective:   Patient ID: Carrie Franco female   DOB: 11-12-1965 48 y.o.   MRN: [id number]  HPI: Carrie Franco is a 48 y.o. woman with PMH significant for HTN, Asthma comes to the office for worsening of her right knee pain and swelling.  Patient was seen in the clinic on 06/04/13 for symptoms of right knee pain and swelling. Patient was recommended conservative management with NSAID's. Patient reports that the pain ad swelling is persistent despite taking Ibuprofen 800 mg TID.  Patients pain started about three weeks ago, insidious in onset, gradually gotten worse. Patients denies any trauma, injury to the right knee and denies any precipitating event that led to the pain/swelling of the right knee. Patient reports that she tried Naproxen, Ibuprofen and heating pads without any relief. She denies any tingling, numbness, weakness, and reports difficulty bearing weight on her right knee. Patient denies any fever, chills, body pains and denies any pain/swelling of any other joints.  Patient denies any other complaints during this office visit.     Past Medical History  Diagnosis Date  . High risk sexual behavior   . Hypertension   . Bacterial vaginitis     recurrent  . Recurrent UTI   . Genital herpes   . Recurrent boils   . External hemorrhoids with complication   . Allergic rhinitis   . Chronic constipation   . History of cocaine abuse 1992  . History of tobacco abuse 1999  . Bilateral carpal tunnel syndrome   . Acne   . Hyperlipidemia   . Asthma    Current Outpatient Prescriptions  Medication Sig Dispense Refill  . albuterol (PROVENTIL HFA;VENTOLIN HFA) 108 (90 BASE) MCG/ACT inhaler Inhale 2 puffs into the lungs every 4 (four) hours as needed. For shortness of breath      . bisoprolol-hydrochlorothiazide (ZIAC) 5-6.25 MG per tablet Take 2 tablets by mouth daily. TAKE ONE TABLET BY MOUTH ONCE DAILY  60 tablet  11  . cyclobenzaprine (FLEXERIL) 5 MG tablet Take 1 tablet (5 mg  total) by mouth every 8 (eight) hours as needed for muscle spasms.  30 tablet  0  . ibuprofen (ADVIL,MOTRIN) 800 MG tablet Take 1 tablet (800 mg total) by mouth 3 (three) times daily.  60 tablet  0  . loratadine (CLARITIN) 10 MG tablet Take 1 tablet (10 mg total) by mouth daily as needed. For allergies  30 tablet  3  . metroNIDAZOLE (METROGEL) 0.75 % vaginal gel Place 1 Applicatorful vaginally 2 (two) times a week. For 4 months  70 g  1  . naproxen (NAPROSYN) 500 MG tablet       . omeprazole (PRILOSEC OTC) 20 MG tablet Take 1 tablet (20 mg total) by mouth daily.  30 tablet  1  . polyvinyl alcohol (LIQUIFILM TEARS) 1.4 % ophthalmic solution Place 1 drop into both eyes 2 (two) times daily.  15 mL  0  . triamcinolone (KENALOG) 0.025 % ointment Apply 1 application topically 2 (two) times daily. To dry skin on back and torso  80 g  0   No current facility-administered medications for this visit.   Family History  Problem Relation Age of Onset  . Cancer Mother   . Diabetes Other    History   Social History  . Marital Status: Single    Spouse Name: N/A    Number of Children: N/A  . Years of Education: N/A   Social History Main Topics  . Smoking status:  Former Smoker    Types: Cigarettes  . Smokeless tobacco: None     Comment: quit 71yrs ago  . Alcohol Use: No  . Drug Use: No  . Sexual Activity: Yes    Birth Control/ Protection: None   Other Topics Concern  . None   Social History Narrative    The patient works at a daycare center, she completed high school, is single   Review of Systems: Pertinent items are noted in HPI. Objective:  Physical Exam: Filed Vitals:   06/09/13 1404  BP: 150/88  Pulse: 67  Weight: 197 lb 8 oz (89.585 kg)  SpO2: 100%   Constitutional: Vital signs reviewed.  Patient is a well-developed and well-nourished and is in no acute distress and cooperative with exam. Alert and oriented x3.  Cardiovascular: RRR, S1 normal, S2 normal, no  MRG Pulmonary/Chest: normal respiratory effort, CTAB, no wheezes, rales, or rhonchi Right knee exam: Right knee is visibly swollen and there is visible supra-patellar fullness noted compared to the left knee.  There is no erythema over the skin. There is no obvious deformity noted. There is no increased warmth over the right knee joint. There is tenderness to palpation over the supra-patellar, infra-patellar, lateral patellar regions. There is positive ballotment on the right knee. There is tenderness to palpation over the quadriceps insertion and over the patellar tendon. Apleys grinding test of the patella is negative. Palpable crepitus heard upon passive flexion and extension of the right knee. Moderately reduced movements at the right knee. Collateral ligament testing, McMurrays test was unable to perform as the patient was severe pain. Neurological: A&O x3 Skin: Warm, dry and intact.  Psychiatric: Normal mood and affect.  Assessment & Plan:

## 2013-06-09 NOTE — Assessment & Plan Note (Addendum)
Non-traumatic, monoarticular, right knee pain/swelling of 3 weeks duration. Physical examination consistent with mild to moderate right knee joint effusion Unclear etiology but given monoarticular non- traumatic presentation in 48 year old - gout and Septic arthritis need to be ruled out. Etiologies include - Osteoarthritis vs Gout vs Pseudogout vs Septic Arthritis vs Reactive arthritis vs other Inflammatory arthropathy vs Intra-articular pathologies such as meniscal tears vs collateral ligament tears etc. Discussed with the attending regarding further management and plan.  Plans: Right knee joint aspiration was performed under septic conditions after obtaining informed consent from the patient. 2 cc of Lidocaine with Epinephrine local anaesthesia was used. About 5 cc of yellow appearing fluid was aspirated from the right knee joint. Fluid was sent for crystal analysis, cell count, gram stain and cultures, protein and glucose. No complications were noted after the procedure. Check Right knee Xray. Conservative management with resting, ice compresses 3-4 times daily, Ibuprofen 800 mg TID. Follow up on the synovial fluid analysis.  Addendum 06/12/13: X-ray of the right knee is negative for bony injuries or osteoarthritis. Synovial fluid analysis is negative for any crystals and septic arthritis. Discussed with Dr. Stann Mainland regarding further management. We think that the WBC count of 1100 is most likely secondary from non-inflammatory arthritis or ligament sprains vs early signs of OA with cartilage involvement. Given monoarticular arthritis and the presentation in knee without any further symptoms or signs, we do not think there is any any need for rheumatological studies. But if the patient is to develop similar symptoms in other joints or develop other significant/concerning symptoms, we will consider rheumatological work up. We will continue NSAID therapy along with Cold compresses. Called patient  and left a voice message about the lab findings and recommended to call the clinic if the pain/swelling worsens or persist or develop any other symptoms.

## 2013-06-09 NOTE — Patient Instructions (Signed)
Please have the xray of your right knee done. If you notice worsening of your symptoms or pain, please give [place] a call or seek medical help. We will call you with the results of your tests.

## 2013-06-09 NOTE — Progress Notes (Unsigned)
Dr. Eyvonne Mechanic and I undertook a simple arthrocentesis of her right knee, which was eventually successful.  During the procedure, she behaved somewhat hysterically about the fear and pain of the needle-stick, and her gentleman-friend (who was holding her from the side) fainted.  He was out on the floor for less than 1 minute and awoke with brief confusion, warmth, dizziness.  Within 5 minutes, he was back to normal.  Pulse, rhythm, and BP were normal during the episode, and he denied any chest pain, dyspnea, or focal CNS symptoms.  He then sat quietly while we accomplished the joint tap and was entirely back to normal several minutes later.

## 2013-06-09 NOTE — Assessment & Plan Note (Signed)
Slightly elevated in the setting of severe right knee pain.  Plans: Continue current regimen.

## 2013-06-10 LAB — SYNOVIAL CELL COUNT + DIFF, W/ CRYSTALS
Crystals, Fluid: NONE SEEN
Eosinophils-Synovial: 0 % (ref 0–1)
Lymphocytes-Synovial Fld: 35 % — ABNORMAL HIGH (ref 0–20)
Monocyte/Macrophage: 58 % (ref 50–90)
Neutrophil, Synovial: 7 % (ref 0–25)
WBC, Synovial: 1100 cu mm — ABNORMAL HIGH (ref 0–200)

## 2013-06-10 LAB — PROTEIN, SYNOVIAL FLUID: Protein, Synovial Fluid: 2.2 g/dL (ref 1.0–3.0)

## 2013-06-10 LAB — GLUCOSE, SYNOVIAL FLUID: Glucose, Synovial Fluid: 78 mg/dL

## 2013-06-10 NOTE — Progress Notes (Signed)
I saw this patient with Dr. Eyvonne Mechanic and assisted with the arthrocentesis.  Several ml of nearly-clear fluid were obtained.  Clinically, she has probable osteoarthritis.  Agree with Dr. Daneil Dolin assessment and plans.

## 2013-06-12 ENCOUNTER — Ambulatory Visit: Payer: No Typology Code available for payment source

## 2013-06-13 LAB — BODY FLUID CULTURE
Gram Stain: NONE SEEN
Organism ID, Bacteria: NO GROWTH

## 2013-06-21 ENCOUNTER — Emergency Department (HOSPITAL_COMMUNITY)
Admission: EM | Admit: 2013-06-21 | Discharge: 2013-06-21 | Disposition: A | Discharge status: Home / Self Care | Payer: No Typology Code available for payment source | Attending: Emergency Medicine | Admitting: Emergency Medicine

## 2013-06-21 ENCOUNTER — Emergency Department (HOSPITAL_COMMUNITY): Payer: No Typology Code available for payment source

## 2013-06-21 ENCOUNTER — Encounter (HOSPITAL_COMMUNITY): Payer: Self-pay | Admitting: Emergency Medicine

## 2013-06-21 DIAGNOSIS — Z8742 Personal history of other diseases of the female genital tract: Secondary | ICD-10-CM | POA: Insufficient documentation

## 2013-06-21 DIAGNOSIS — Z87440 Personal history of urinary (tract) infections: Secondary | ICD-10-CM | POA: Insufficient documentation

## 2013-06-21 DIAGNOSIS — Z8619 Personal history of other infectious and parasitic diseases: Secondary | ICD-10-CM | POA: Insufficient documentation

## 2013-06-21 DIAGNOSIS — IMO0002 Reserved for concepts with insufficient information to code with codable children: Secondary | ICD-10-CM | POA: Insufficient documentation

## 2013-06-21 DIAGNOSIS — Z7251 High risk heterosexual behavior: Secondary | ICD-10-CM | POA: Insufficient documentation

## 2013-06-21 DIAGNOSIS — Z880 Allergy status to penicillin: Secondary | ICD-10-CM | POA: Insufficient documentation

## 2013-06-21 DIAGNOSIS — Z8719 Personal history of other diseases of the digestive system: Secondary | ICD-10-CM | POA: Insufficient documentation

## 2013-06-21 DIAGNOSIS — Z8669 Personal history of other diseases of the nervous system and sense organs: Secondary | ICD-10-CM | POA: Insufficient documentation

## 2013-06-21 DIAGNOSIS — E785 Hyperlipidemia, unspecified: Secondary | ICD-10-CM | POA: Insufficient documentation

## 2013-06-21 DIAGNOSIS — Z79899 Other long term (current) drug therapy: Secondary | ICD-10-CM | POA: Insufficient documentation

## 2013-06-21 DIAGNOSIS — I10 Essential (primary) hypertension: Secondary | ICD-10-CM | POA: Insufficient documentation

## 2013-06-21 DIAGNOSIS — M25561 Pain in right knee: Secondary | ICD-10-CM

## 2013-06-21 DIAGNOSIS — M25569 Pain in unspecified knee: Secondary | ICD-10-CM | POA: Insufficient documentation

## 2013-06-21 DIAGNOSIS — Z872 Personal history of diseases of the skin and subcutaneous tissue: Secondary | ICD-10-CM | POA: Insufficient documentation

## 2013-06-21 DIAGNOSIS — Z791 Long term (current) use of non-steroidal anti-inflammatories (NSAID): Secondary | ICD-10-CM | POA: Insufficient documentation

## 2013-06-21 DIAGNOSIS — J45909 Unspecified asthma, uncomplicated: Secondary | ICD-10-CM | POA: Insufficient documentation

## 2013-06-21 DIAGNOSIS — Z87891 Personal history of nicotine dependence: Secondary | ICD-10-CM | POA: Insufficient documentation

## 2013-06-21 MED ORDER — PREDNISONE 50 MG PO TABS: 50.0000 mg | ORAL_TABLET | Freq: Every day | ORAL | Status: DC

## 2013-06-21 MED ORDER — HYDROCODONE-ACETAMINOPHEN 5-325 MG PO TABS
1.0000 | ORAL_TABLET | Freq: Once | ORAL | Status: AC
  Administered 2013-06-21: 1 via ORAL
  Filled 2013-06-21: qty 1

## 2013-06-21 MED ORDER — OXYCODONE-ACETAMINOPHEN 5-325 MG PO TABS
1.0000 | ORAL_TABLET | Freq: Once | ORAL | Status: DC
  Filled 2013-06-21: qty 1

## 2013-06-21 MED ORDER — OXYCODONE-ACETAMINOPHEN 5-325 MG PO TABS: 1.0000 | ORAL_TABLET | Freq: Four times a day (QID) | ORAL | Status: DC | PRN

## 2013-06-21 MED ORDER — KETOROLAC TROMETHAMINE 60 MG/2ML IM SOLN
60.0000 mg | Freq: Once | INTRAMUSCULAR | Status: AC
  Administered 2013-06-21: 60 mg via INTRAMUSCULAR
  Filled 2013-06-21: qty 2

## 2013-06-21 MED ORDER — HYDROCODONE-ACETAMINOPHEN 5-325 MG PO TABS: 1.0000 | ORAL_TABLET | Freq: Four times a day (QID) | ORAL | Status: DC | PRN

## 2013-06-21 NOTE — ED Provider Notes (Signed)
CSN: [id number]     Arrival date & time 06/21/13  0031 History   First MD Initiated Contact with Patient 06/21/13 0046     Chief Complaint  Patient presents with  . Knee Pain     (Consider location/radiation/quality/duration/timing/severity/associated sxs/prior Treatment) HPI Patient presents to the emergency department with right-sided knee pain.  The patient, states, that her right knee has been bothering her for the last 3 weeks.  She states that she has pain over the anterior portion of her knee.  Patient, states, that movement and palpation make the pain, worse.  Nothing seems make her condition, better.  Patient, states she was seen by her primary care Dr. to drain fluid from her knee.  Patient, states, that she's not had any fever, nausea, vomiting, headache, blurred vision, weakness, numbness, dizziness, swelling, back pain.  He or syncope.  The patient, states, that she has been taking ibuprofen without relief of her symptoms Past Medical History  Diagnosis Date  . High risk sexual behavior   . Hypertension   . Bacterial vaginitis     recurrent  . Recurrent UTI   . Genital herpes   . Recurrent boils   . External hemorrhoids with complication   . Allergic rhinitis   . Chronic constipation   . History of cocaine abuse 1992  . History of tobacco abuse 1999  . Bilateral carpal tunnel syndrome   . Acne   . Hyperlipidemia   . Asthma    Past Surgical History  Procedure Laterality Date  . Tummy tuck  03/2010   Family History  Problem Relation Age of Onset  . Cancer Mother   . Diabetes Other    History  Substance Use Topics  . Smoking status: Former Smoker    Types: Cigarettes  . Smokeless tobacco: Not on file     Comment: quit 34yrs ago  . Alcohol Use: No   OB History   Grav Para Term Preterm Abortions TAB SAB Ect Mult Living   4 3   1  1   3      Review of Systems   All other systems negative except as documented in the HPI. All pertinent positives and negatives  as reviewed in the HPI. Allergies  Orange fruit and Penicillins  Home Medications   Prior to Admission medications   Medication Sig Start Date End Date Taking? Authorizing Provider  bisoprolol-hydrochlorothiazide (ZIAC) 5-6.25 MG per tablet Take 1 tablet by mouth daily.   Yes Historical Provider, MD  ibuprofen (ADVIL,MOTRIN) 800 MG tablet Take 1 tablet (800 mg total) by mouth 3 (three) times daily. 06/09/13  Yes Carter Kitten, MD  loratadine (CLARITIN) 10 MG tablet Take 1 tablet (10 mg total) by mouth daily as needed. For allergies 03/19/13  Yes Jeralene Huff, MD  omeprazole (PRILOSEC OTC) 20 MG tablet Take 1 tablet (20 mg total) by mouth daily. 03/19/13  Yes Jeralene Huff, MD  triamcinolone (KENALOG) 0.025 % ointment Apply 1 application topically 2 (two) times daily. To dry skin on back and torso 06/04/13  Yes Neema K Sharda, MD  albuterol (PROVENTIL HFA;VENTOLIN HFA) 108 (90 BASE) MCG/ACT inhaler Inhale 2 puffs into the lungs every 4 (four) hours as needed. For shortness of breath 03/30/11 11/12/12  Nils Pyle, MD  metroNIDAZOLE (METROGEL) 0.75 % vaginal gel Place 1 Applicatorful vaginally 2 (two) times a week. For 4 months 06/09/13   Carter Kitten, MD   BP 160/80  Pulse 67  Temp(Src) 97.9 F (36.6  C) (Oral)  Resp 16  SpO2 99%  LMP 11/22/2012 Physical Exam  Nursing note and vitals reviewed. Constitutional: She is oriented to person, place, and time. She appears well-developed and well-nourished.  HENT:  Head: Normocephalic and atraumatic.  Eyes: Pupils are equal, round, and reactive to light.  Pulmonary/Chest: Effort normal.  Musculoskeletal:       Right knee: She exhibits normal range of motion, no swelling, no effusion, no ecchymosis, no deformity, no erythema and no bony tenderness. Tenderness found.       Legs: Neurological: She is alert and oriented to person, place, and time.  Skin: Skin is warm and dry. No rash noted. No erythema.    ED Course    Procedures (including critical care time) Labs Review Labs Reviewed - No data to display  Imaging Review Dg Knee Complete 4 Views Right  06/21/2013   CLINICAL DATA:  Atraumatic anterior right knee pain  EXAM: RIGHT KNEE - COMPLETE 4+ VIEW  COMPARISON:  Right knee series dated Jun 09, 2013.  FINDINGS: There is no evidence of fracture, dislocation, or joint effusion. There is very mild beaking of the tibial spines. The joint spaces are reasonably well maintained. There is a small anterior superior spur from the patella. Soft tissues are unremarkable.  IMPRESSION: Very mild degenerative change of the right knee is present. There is no acute bony abnormality.   Electronically Signed   By: David  Martinique   On: 06/21/2013 01:37   Patient is referred to orthopedics for further evaluation.  Told to return here as needed.  Told to ice and elevate her knee.  Told to followup with her primary care Dr. as well.  He should most likely has some internal issue with her knee.  That Will need further evaluation  Brent General, PA-C 06/21/13 0425

## 2013-06-21 NOTE — ED Notes (Signed)
Pt arrived to the Ed with a complaint of right knee pain.  Pt has had pain for three weeks.  Pt has been seen for same at internists.  Pt given ibuprofen but without relief.  Pt states she has she has a bruise on the right leg which is new in the last couple of days.

## 2013-06-21 NOTE — Discharge Instructions (Signed)
Followup with the orthopedist provided.  Return here as needed.  X-rays did not show any abnormalities.  Ice and heat to your knee

## 2013-06-22 NOTE — ED Provider Notes (Signed)
Medical screening examination/treatment/procedure(s) were performed by non-physician practitioner and as supervising physician I was immediately available for consultation/collaboration.   EKG Interpretation None       Kalman Drape, MD 06/22/13 [phone]

## 2013-06-23 ENCOUNTER — Ambulatory Visit: Payer: No Typology Code available for payment source

## 2013-06-23 ENCOUNTER — Ambulatory Visit: Payer: No Typology Code available for payment source | Attending: Internal Medicine

## 2013-06-23 DIAGNOSIS — M2550 Pain in unspecified joint: Secondary | ICD-10-CM | POA: Insufficient documentation

## 2013-06-23 DIAGNOSIS — IMO0001 Reserved for inherently not codable concepts without codable children: Secondary | ICD-10-CM | POA: Insufficient documentation

## 2013-06-24 ENCOUNTER — Telehealth: Payer: Self-pay | Admitting: *Deleted

## 2013-06-24 NOTE — Telephone Encounter (Signed)
Call from pt - states Metrogel is too expensive; wants to know if it can be change to pill form - said she had checked and it is cheaper. Thanks

## 2013-06-29 ENCOUNTER — Emergency Department (HOSPITAL_COMMUNITY)
Admission: EM | Admit: 2013-06-29 | Discharge: 2013-06-29 | Disposition: A | Discharge status: Home / Self Care | Payer: No Typology Code available for payment source | Attending: Emergency Medicine | Admitting: Emergency Medicine

## 2013-06-29 ENCOUNTER — Encounter (HOSPITAL_COMMUNITY): Payer: Self-pay | Admitting: Emergency Medicine

## 2013-06-29 DIAGNOSIS — Z880 Allergy status to penicillin: Secondary | ICD-10-CM | POA: Insufficient documentation

## 2013-06-29 DIAGNOSIS — Z8719 Personal history of other diseases of the digestive system: Secondary | ICD-10-CM | POA: Insufficient documentation

## 2013-06-29 DIAGNOSIS — I10 Essential (primary) hypertension: Secondary | ICD-10-CM | POA: Insufficient documentation

## 2013-06-29 DIAGNOSIS — Z792 Long term (current) use of antibiotics: Secondary | ICD-10-CM | POA: Insufficient documentation

## 2013-06-29 DIAGNOSIS — Z8742 Personal history of other diseases of the female genital tract: Secondary | ICD-10-CM | POA: Insufficient documentation

## 2013-06-29 DIAGNOSIS — J45909 Unspecified asthma, uncomplicated: Secondary | ICD-10-CM | POA: Insufficient documentation

## 2013-06-29 DIAGNOSIS — Z87891 Personal history of nicotine dependence: Secondary | ICD-10-CM | POA: Insufficient documentation

## 2013-06-29 DIAGNOSIS — Z8619 Personal history of other infectious and parasitic diseases: Secondary | ICD-10-CM | POA: Insufficient documentation

## 2013-06-29 DIAGNOSIS — Z7251 High risk heterosexual behavior: Secondary | ICD-10-CM | POA: Insufficient documentation

## 2013-06-29 DIAGNOSIS — Z872 Personal history of diseases of the skin and subcutaneous tissue: Secondary | ICD-10-CM | POA: Insufficient documentation

## 2013-06-29 DIAGNOSIS — M25469 Effusion, unspecified knee: Secondary | ICD-10-CM | POA: Insufficient documentation

## 2013-06-29 DIAGNOSIS — M25461 Effusion, right knee: Secondary | ICD-10-CM

## 2013-06-29 DIAGNOSIS — Z79899 Other long term (current) drug therapy: Secondary | ICD-10-CM | POA: Insufficient documentation

## 2013-06-29 DIAGNOSIS — Z8669 Personal history of other diseases of the nervous system and sense organs: Secondary | ICD-10-CM | POA: Insufficient documentation

## 2013-06-29 DIAGNOSIS — Z87440 Personal history of urinary (tract) infections: Secondary | ICD-10-CM | POA: Insufficient documentation

## 2013-06-29 DIAGNOSIS — IMO0002 Reserved for concepts with insufficient information to code with codable children: Secondary | ICD-10-CM | POA: Insufficient documentation

## 2013-06-29 DIAGNOSIS — E785 Hyperlipidemia, unspecified: Secondary | ICD-10-CM | POA: Insufficient documentation

## 2013-06-29 MED ORDER — HYDROCODONE-ACETAMINOPHEN 5-325 MG PO TABS: ORAL_TABLET | ORAL | Status: DC

## 2013-06-29 MED ORDER — MELOXICAM 7.5 MG PO TABS: 7.5000 mg | ORAL_TABLET | Freq: Every day | ORAL | Status: DC

## 2013-06-29 MED ORDER — METRONIDAZOLE 500 MG PO TABS: 500.0000 mg | ORAL_TABLET | Freq: Two times a day (BID) | ORAL | Status: DC

## 2013-06-29 MED ORDER — KETOROLAC TROMETHAMINE 60 MG/2ML IM SOLN
60.0000 mg | Freq: Once | INTRAMUSCULAR | Status: AC
  Administered 2013-06-29: 60 mg via INTRAMUSCULAR
  Filled 2013-06-29: qty 2

## 2013-06-29 NOTE — Telephone Encounter (Signed)
Change to Flagyl tablets on 6/15 per ED - see EPIC encounter.

## 2013-06-29 NOTE — ED Notes (Signed)
Pt attempted to follow up with orthopedics, but was told that they did not take her insurance. She attempted to follow up with another orthopedic MD in [facility], and could not get an appointment until, July.

## 2013-06-29 NOTE — ED Provider Notes (Signed)
CSN: [id number]     Arrival date & time 06/29/13  0447 History   First MD Initiated Contact with Patient 06/29/13 0602     Chief Complaint  Patient presents with  . Knee Pain     (Consider location/radiation/quality/duration/timing/severity/associated sxs/prior Treatment) HPI Comments: Patient presents with complaint of right knee [place] and swelling for approximately one month. Patient has seen her primary care physician for this. She has tried oral ibuprofen, Aleve, prednisone. Patient states that she had her knee aspirated by her primary care doctor without signs of gout or infection in the joint. Patient has not had any recent fevers. Patient stands on concrete during the day and she attributes her problems to this. The onset of this condition was gradual. The course is persistent. Aggravating factors: movement. Alleviating factors: none.    In addition, patient states that she was recently prescribed metronidazole gel to her primary care physician due to recurrent BV. She states that she was able to fill this do to cost and requests that we prescribe her the oral medication today.  The history is provided by the patient.    Past Medical History  Diagnosis Date  . High risk sexual behavior   . Hypertension   . Bacterial vaginitis     recurrent  . Recurrent UTI   . Genital herpes   . Recurrent boils   . External hemorrhoids with complication   . Allergic rhinitis   . Chronic constipation   . History of cocaine abuse 1992  . History of tobacco abuse 1999  . Bilateral carpal tunnel syndrome   . Acne   . Hyperlipidemia   . Asthma    Past Surgical History  Procedure Laterality Date  . Tummy tuck  03/2010   Family History  Problem Relation Age of Onset  . Cancer Mother   . Diabetes Other    History  Substance Use Topics  . Smoking status: Former Smoker    Types: Cigarettes  . Smokeless tobacco: Not on file     Comment: quit 17yrs ago  . Alcohol Use: No   OB History    Grav Para Term Preterm Abortions TAB SAB Ect Mult Living   4 3   1  1   3      Review of Systems  Constitutional: Negative for fever.  HENT: Negative for rhinorrhea and sore throat.   Eyes: Negative for redness.  Respiratory: Negative for cough.   Cardiovascular: Negative for chest pain.  Gastrointestinal: Negative for nausea, vomiting, abdominal pain and diarrhea.  Genitourinary: Negative for dysuria.  Musculoskeletal: Positive for arthralgias and joint swelling. Negative for myalgias.  Skin: Negative for rash.  Neurological: Negative for headaches.    Allergies  Orange fruit and Penicillins  Home Medications   Prior to Admission medications   Medication Sig Start Date End Date Taking? Authorizing Provider  albuterol (PROVENTIL HFA;VENTOLIN HFA) 108 (90 BASE) MCG/ACT inhaler Inhale 2 puffs into the lungs every 4 (four) hours as needed. For shortness of breath 03/30/11 06/29/13 Yes Nils Pyle, MD  bisoprolol-hydrochlorothiazide [facility]) 5-6.25 MG per tablet Take 1 tablet by mouth daily.   Yes Historical Provider, MD  cyclobenzaprine (FLEXERIL) 5 MG tablet Take 5 mg by mouth 3 (three) times daily as needed for muscle spasms.   Yes Historical Provider, MD  loratadine (CLARITIN) 10 MG tablet Take 1 tablet (10 mg total) by mouth daily as needed. For allergies 03/19/13  Yes Jeralene Huff, MD  omeprazole (PRILOSEC OTC) 20 MG  tablet Take 1 tablet (20 mg total) by mouth daily. 03/19/13  Yes Jeralene Huff, MD  triamcinolone (KENALOG) 0.025 % ointment Apply 1 application topically 2 (two) times daily. To dry skin on back and torso 06/04/13  Yes Neema K Sharda, MD  metroNIDAZOLE (METROGEL) 0.75 % vaginal gel Place 1 Applicatorful vaginally 2 (two) times a week. For 4 months 06/09/13   Carter Kitten, MD   BP 138/80  Pulse 67  Temp(Src) 98.3 F (36.8 C) (Oral)  Resp 20  SpO2 100%  LMP 11/22/2012  Physical Exam  Nursing note and vitals reviewed. Constitutional: She  appears well-developed and well-nourished.  HENT:  Head: Normocephalic and atraumatic.  Eyes: Pupils are equal, round, and reactive to light.  Neck: Normal range of motion. Neck supple.  Cardiovascular: Exam reveals no decreased pulses.   Pulses:      Radial pulses are 2+ on the right side, and 2+ on the left side.  Musculoskeletal: She exhibits tenderness. She exhibits no edema.       Right hip: Normal.       Right knee: She exhibits swelling and effusion. She exhibits normal range of motion. Tenderness (generalized) found.       Right ankle: Normal.  Neurological: She is alert. No sensory deficit.  Motor, sensation, and vascular distal to the injury is fully intact.   Skin: Skin is warm and dry.  Psychiatric: She has a normal mood and affect.    ED Course  Procedures (including critical care time) Labs Review Labs Reviewed - No data to display  Imaging Review No results found.   EKG Interpretation None      6:20 AM Patient seen and examined.   Vital signs reviewed and are as follows: Filed Vitals:   06/29/13 0541  BP: 138/80  Pulse: 67  Temp: 98.3 F (36.8 C)  Resp: 20   I am comfortable with the workup performed by PCP due to joint swelling. Patient has orthopedic followup next month. Will prescribe course of meloxicam and give Toradol IM here.  Patient was counseled on RICE protocol and told to rest injury, use ice for no longer than 15 minutes every hour, compress the area, and elevate above the level of their heart as much as possible to reduce swelling. Questions answered. Patient verbalized understanding.    Patient counseled on use of narcotic pain medications. Counseled not to combine these medications with others containing tylenol. Urged not to drink alcohol, drive, or perform any other activities that requires focus while taking these medications. The patient verbalizes understanding and agrees with the plan.  MDM   Final diagnoses:  Effusion of right  knee   Patient with knee effusion likely due to overuse/osteoarthritis. It sounds like she has failed multiple courses of NSAIDs. She is walking, has no fevers, has good range of motion today. Do not suspect a septic arthritis.   Carlisle Cater, PA-C 06/29/13 [phone]

## 2013-06-29 NOTE — Discharge Instructions (Signed)
Please read and follow all provided instructions.  Your diagnoses today include:  1. Effusion of right knee     Tests performed today include:  Vital signs. See below for your results today.   Medications prescribed:   Meloxicam - anti-inflammatory pain medication  You have been prescribed an anti-inflammatory medication or NSAID. Take with food. Do not take aspirin, ibuprofen, or naproxen if taking this medication. Take smallest effective dose for the shortest duration needed for your pain. Stop taking if you experience stomach pain or vomiting.    Vicodin (hydrocodone/acetaminophen) - narcotic pain medication  DO NOT drive or perform any activities that require you to be awake and alert because this medicine can make you drowsy. BE VERY CAREFUL not to take multiple medicines containing Tylenol (also called acetaminophen). Doing so can lead to an overdose which can damage your liver and cause liver failure and possibly death.   Metronidazole - antibiotic  You have been prescribed an antibiotic medicine: take the entire course of medicine even if you are feeling better. Stopping early can cause the antibiotic not to work. Do not drink alcohol when taking this medication.   Take any prescribed medications only as directed.  Home care instructions:   Follow any educational materials contained in this packet  Follow R.I.C.E. Protocol:  R - rest your injury   I  - use ice on injury without applying directly to skin  C - compress injury with bandage or splint  E - elevate the injury as much as possible  Follow-up instructions: Please follow-up with your primary care provider if you continue to have significant pain or trouble walking in 1 week. Follow-up with your orthopedist as planned.   Return instructions:   Please return to the Emergency Department if you experience worsening symptoms.   Please return if you have any other emergent concerns.  Additional  Information:  Your vital signs today were: BP 138/80   Pulse 67   Temp(Src) 98.3 F (36.8 C) (Oral)   Resp 20   SpO2 100%   LMP 11/22/2012 If your blood pressure (BP) was elevated above 135/85 this visit, please have this repeated by your doctor within one month. --------------

## 2013-06-29 NOTE — ED Notes (Signed)
NT entered room and Pt was walking and carrying trash can, however pt complains of knee pain. (Pt states she was cleaning out her bag.)

## 2013-06-29 NOTE — ED Notes (Signed)
C/o R knee pain and swelling x 1 month.  No known injury.  States she was seen in ED for same approx 2 weeks ago and had knee aspirated.

## 2013-06-30 ENCOUNTER — Ambulatory Visit: Payer: No Typology Code available for payment source

## 2013-06-30 NOTE — ED Provider Notes (Signed)
Medical screening examination/treatment/procedure(s) were performed by non-physician practitioner and as supervising physician I was immediately available for consultation/collaboration.   EKG Interpretation None       Varney Biles, MD 06/30/13 [phone]

## 2013-07-28 ENCOUNTER — Other Ambulatory Visit: Payer: Self-pay | Admitting: *Deleted

## 2013-07-28 MED ORDER — IBUPROFEN 800 MG PO TABS
800.0000 mg | ORAL_TABLET | Freq: Four times a day (QID) | ORAL | Status: DC | PRN
Start: 2013-07-28 — End: 2013-07-31

## 2013-07-29 ENCOUNTER — Telehealth: Payer: Self-pay | Admitting: *Deleted

## 2013-07-29 NOTE — Telephone Encounter (Signed)
Pt calls and states she should have 2 rounds of po flagyl for her "problem" because that's what it takes, she states she took the full 7 days of po flagyl and continues to have "the problem" she was ask to be more direct and states "that odor in the vag", she is advised she would need an appt and is agreeable for fri 7/17 at 1315 dr Naaman Plummer

## 2013-07-31 ENCOUNTER — Encounter: Payer: Self-pay | Admitting: Internal Medicine

## 2013-07-31 ENCOUNTER — Ambulatory Visit (INDEPENDENT_AMBULATORY_CARE_PROVIDER_SITE_OTHER): Payer: No Typology Code available for payment source | Admitting: Internal Medicine

## 2013-07-31 VITALS — BP 137/87 | HR 73 | Ht 65.0 in | Wt 202.2 lb

## 2013-07-31 DIAGNOSIS — I10 Essential (primary) hypertension: Secondary | ICD-10-CM

## 2013-07-31 DIAGNOSIS — E785 Hyperlipidemia, unspecified: Secondary | ICD-10-CM | POA: Insufficient documentation

## 2013-07-31 DIAGNOSIS — N760 Acute vaginitis: Secondary | ICD-10-CM

## 2013-07-31 DIAGNOSIS — A499 Bacterial infection, unspecified: Secondary | ICD-10-CM

## 2013-07-31 DIAGNOSIS — B9689 Other specified bacterial agents as the cause of diseases classified elsewhere: Secondary | ICD-10-CM

## 2013-07-31 DIAGNOSIS — E1169 Type 2 diabetes mellitus with other specified complication: Secondary | ICD-10-CM | POA: Insufficient documentation

## 2013-07-31 DIAGNOSIS — M25561 Pain in right knee: Secondary | ICD-10-CM

## 2013-07-31 DIAGNOSIS — M25569 Pain in unspecified knee: Secondary | ICD-10-CM

## 2013-07-31 LAB — POCT URINALYSIS DIPSTICK
Bilirubin, UA: NEGATIVE
Blood, UA: NEGATIVE
Glucose, UA: NEGATIVE
Ketones, UA: NEGATIVE
LEUKOCYTES UA: NEGATIVE
Nitrite, UA: NEGATIVE
PROTEIN UA: NEGATIVE
Spec Grav, UA: >=1.03
UROBILINOGEN UA: 1
pH, UA: 6.5

## 2013-07-31 LAB — POCT URINE PREGNANCY: Preg Test, Ur: NEGATIVE

## 2013-07-31 MED ORDER — CLINDAMYCIN HCL 300 MG PO CAPS: 300.0000 mg | ORAL_CAPSULE | Freq: Three times a day (TID) | ORAL | Status: DC

## 2013-07-31 MED ORDER — IBUPROFEN 800 MG PO TABS: 800.0000 mg | ORAL_TABLET | Freq: Four times a day (QID) | ORAL | Status: DC | PRN

## 2013-07-31 NOTE — Assessment & Plan Note (Signed)
Assessment: Pt with last lipid panel on 03/11/13 with hypertriglyceridemia not currently on statin therapy with 10-yr ASCVD risk of 3.7% and lifetime risk of 39% with no recommendations to start statin therapy due to risk <5%.  Plan:  -Encourage lifestyle modification  -Consider repeating lipid panel

## 2013-07-31 NOTE — Assessment & Plan Note (Signed)
Assessment: Pt with moderate to well-controlled hypertension compliant with two-class (BB & diuretic) anti-hypertensive therapy who presents with blood pressure of 137/87.  Plan:  -BP 137/87 at goal <140/90 -Continue bisoprolol-HCTZ 5-6.25 mg daily -Last BMP on 06/04/13 was normal

## 2013-07-31 NOTE — Assessment & Plan Note (Addendum)
Assessment: Pt with 73-month history of nontraumatic right knee pain with XR imaging on 06/21/13 indicating small anterior superior patellar spur and very mild degenerative changes who presents with persistent knee pain and swelling with mild improvement following recent intraarticular corticosteroid therapy.   Plan:  -Refill ibuprofen 800 mg Q 6 hr PRN pain -Pt follows with [facility] and recently seen on June 23, 2013

## 2013-07-31 NOTE — Patient Instructions (Addendum)
-  Will check your urine and lab work today -Start taking clindamycin 300 mg three times daily for 7 days -Will refer you to gynecology  -I have refilled you ibuprofen -Nice seeing you today, will see you back as needed   General Instructions:   Please try to bring all your medicines next time. This will help [place] keep you safe from mistakes.   Progress Toward Treatment Goals:  Treatment Goal 01/12/2013  Blood pressure at goal    Self Care Goals & Plans:  Self Care Goal 07/31/2013  Manage my medications take my medicines as prescribed; bring my medications to every visit  Monitor my health -  Eat healthy foods eat more vegetables; eat foods that are low in salt; eat baked foods instead of fried foods  Be physically active take a walk every day  Meeting treatment goals -    No flowsheet data found.   Care Management & Community Referrals:  Referral 01/12/2013  Referrals made for care management support none needed  Referrals made to community resources -

## 2013-07-31 NOTE — Assessment & Plan Note (Addendum)
Assessment: Pt with history of recurrent bacterial vaginosis with symptoms of cervical discharge and odor occuring shortly after intercourse since February with two treatments this past year with metronidazole (February and June) who presents with symptoms suggestive of recurrent BV.   Plan:  -Obtain urine dipstick ---> negative for UTI -Obtain urine pregnancy test ---> negative -Obtain vaginal wet prep -Obtain HIV antibody -Will empirically treat for BV with clindamycin 300 mg TID for 7 days (pt unsure if has had possible itching with medication in the past, if intolerance will prescribe PO metronidazole 500 mg BID for 7 days) -Pt instructed to use condom protection  -Referral to gynecology for further management, pt unable to afford metronidazole gel for chronic suppressive therapy

## 2013-07-31 NOTE — Progress Notes (Signed)
Patient ID: Carrie Franco, female   DOB: 1965-03-25, 48 y.o.   MRN: [id number]    Subjective:   Patient ID: Carrie Franco female   DOB: 01-03-1966 48 y.o.   MRN: [id number]  HPI: [name] is a 48 y.o. with past medical history of hypertension, hyperlipidemia, asthma, allergic rhinitis, eczema, and recurrent BV who presents with chief complaint of cervical discharge and odor.    She reports that she has had symptoms of cervical discharge (thick and white) and fishy odor since February of this year where she was prescribed metronidazole gel in March that she could not afford and consequently never used. She was recently seen in the ED on June 15 where she was prescribed PO metronidazole (also taken in February) which she took for 7 days but then returned with symptoms after 2-3 weeks. She reports being in a monogamous relationship for the past 3 years. She has noticed her symptoms occur 3-4 days after sexual activity. They do not use condoms and she believes she is allergic to sperm. She has mild pruritis but denies urinary symptoms, dyspareunia, vaginal bleeding, pelvic/abdominal pain, nausea, vomiting, or douching. She reports a few years ago being prescribed ciprofloxacin to take 3-4 hrs after sex for UTI along with diflucan weekly for vaginal candidiasis which prevented her from developing symptoms.      She reports compliance with taking bisoprolol-HCTZ 5-6.25 mg daily and denies headache, blurry vision, chest pain, palpitations, or lightheadedness.  She has had chronic right knee pain for the past 2-3 months and recently seen by Rehab where she received intraarticular corticosteroid injections. She reports swelling without fever, chills, erythema, or warmth. She has not had relief with pain medications and requests for refill on extra strength ibuprofen.     Past Medical History  Diagnosis Date  . High risk sexual behavior   . Hypertension   . Bacterial vaginitis     recurrent  .  Recurrent UTI   . Genital herpes   . Recurrent boils   . External hemorrhoids with complication   . Allergic rhinitis   . Chronic constipation   . History of cocaine abuse 1992  . History of tobacco abuse 1999  . Bilateral carpal tunnel syndrome   . Acne   . Hyperlipidemia   . Asthma    Current Outpatient Prescriptions  Medication Sig Dispense Refill  . albuterol (PROVENTIL HFA;VENTOLIN HFA) 108 (90 BASE) MCG/ACT inhaler Inhale 2 puffs into the lungs every 4 (four) hours as needed. For shortness of breath      . bisoprolol-hydrochlorothiazide (ZIAC) 5-6.25 MG per tablet Take 1 tablet by mouth daily.      . cyclobenzaprine (FLEXERIL) 5 MG tablet Take 5 mg by mouth 3 (three) times daily as needed for muscle spasms.      Marland Kitchen HYDROcodone-acetaminophen (NORCO/VICODIN) 5-325 MG per tablet Take 1-2 tablets every 6 hours as needed for severe pain  10 tablet  0  . ibuprofen (ADVIL,MOTRIN) 800 MG tablet Take 1 tablet (800 mg total) by mouth every 6 (six) hours as needed.  60 tablet  0  . loratadine (CLARITIN) 10 MG tablet Take 1 tablet (10 mg total) by mouth daily as needed. For allergies  30 tablet  3  . meloxicam (MOBIC) 7.5 MG tablet Take 1 tablet (7.5 mg total) by mouth daily.  14 tablet  0  . metroNIDAZOLE (FLAGYL) 500 MG tablet Take 1 tablet (500 mg total) by mouth 2 (two) times daily.  14 tablet  0  . metroNIDAZOLE (METROGEL) 0.75 % vaginal gel Place 1 Applicatorful vaginally 2 (two) times a week. For 4 months  70 g  1  . omeprazole (PRILOSEC OTC) 20 MG tablet Take 1 tablet (20 mg total) by mouth daily.  30 tablet  1  . triamcinolone (KENALOG) 0.025 % ointment Apply 1 application topically 2 (two) times daily. To dry skin on back and torso  80 g  0   No current facility-administered medications for this visit.   Family History  Problem Relation Age of Onset  . Cancer Mother   . Diabetes Other    History   Social History  . Marital Status: Single    Spouse Name: N/A    Number of  Children: N/A  . Years of Education: N/A   Social History Main Topics  . Smoking status: Former Smoker    Types: Cigarettes  . Smokeless tobacco: Not on file     Comment: quit 21yrs ago  . Alcohol Use: No  . Drug Use: No  . Sexual Activity: Yes    Birth Control/ Protection: None   Other Topics Concern  . Not on file   Social History Narrative    The patient works at a daycare center, she completed high school, is single   Review of Systems: Review of Systems  Constitutional: Negative for fever, chills and malaise/fatigue.  Eyes: Negative for blurred vision.  Respiratory: Negative for cough, shortness of breath and wheezing.   Cardiovascular: Positive for leg swelling (trace). Negative for chest pain and palpitations.  Gastrointestinal: Negative for nausea, vomiting, abdominal pain, diarrhea and constipation.  Genitourinary: Negative for dysuria, urgency, frequency and hematuria.  Musculoskeletal: Positive for joint pain (right knee).  Skin: Negative for rash.  Neurological: Negative for dizziness, sensory change and headaches.     Objective:  Physical Exam: Filed Vitals:   07/31/13 1347  BP: 137/87  Pulse: 73  Height: 5\' 5"  (1.651 m)  Weight: 202 lb 3.2 oz (91.717 kg)  SpO2: 99%    Physical Exam  Constitutional: She is oriented to person, place, and time. She appears well-developed and well-nourished. No distress.  HENT:  Head: Normocephalic and atraumatic.  Eyes: EOM are normal.  Neck: Normal range of motion. Neck supple.  Cardiovascular: Normal rate, regular rhythm and normal heart sounds.   Pulmonary/Chest: Effort normal and breath sounds normal. No respiratory distress. She has no wheezes. She has no rales.  Abdominal: Soft. Bowel sounds are normal. She exhibits no distension. There is no tenderness. There is no rebound.  Genitourinary: Vagina normal. No labial fusion. There is no rash, tenderness, lesion or injury on the right labia. There is no rash,  tenderness, lesion or injury on the left labia. Cervix exhibits discharge (moderate thin white discharge with mild fishy odor ). Cervix exhibits no motion tenderness and no friability. No erythema, tenderness or bleeding around the vagina. No foreign body around the vagina. No signs of injury around the vagina. No vaginal discharge found.  Musculoskeletal: She exhibits edema (right knee) and tenderness (right knee).  Decreased ROM of right knee   Neurological: She is alert and oriented to person, place, and time.  Skin: Skin is dry. No rash noted. She is not diaphoretic. No erythema. No pallor.  Psychiatric: She has a normal mood and affect. Her behavior is normal. Judgment and thought content normal.    Assessment & Plan:   Please see problem-list for problem based assessment and plan

## 2013-08-01 LAB — HIV ANTIBODY (ROUTINE TESTING W REFLEX): HIV: NONREACTIVE

## 2013-08-02 NOTE — Progress Notes (Signed)
INTERNAL MEDICINE TEACHING ATTENDING ADDENDUM - Aldine Contes, MD: I reviewed and discussed at the time of visit with the resident Dr. Naaman Plummer, the patient's medical history, physical examination, diagnosis and results of tests and treatment and I agree with the patient's care as documented.

## 2013-09-14 ENCOUNTER — Ambulatory Visit: Payer: No Typology Code available for payment source | Attending: Internal Medicine | Admitting: Physical Therapy

## 2013-09-14 DIAGNOSIS — M2550 Pain in unspecified joint: Secondary | ICD-10-CM | POA: Diagnosis not present

## 2013-09-14 DIAGNOSIS — IMO0001 Reserved for inherently not codable concepts without codable children: Secondary | ICD-10-CM | POA: Insufficient documentation

## 2013-09-15 ENCOUNTER — Encounter: Payer: Self-pay | Admitting: Obstetrics & Gynecology

## 2013-09-16 ENCOUNTER — Encounter: Payer: Self-pay | Admitting: Internal Medicine

## 2013-09-16 ENCOUNTER — Ambulatory Visit (INDEPENDENT_AMBULATORY_CARE_PROVIDER_SITE_OTHER): Payer: No Typology Code available for payment source | Admitting: Internal Medicine

## 2013-09-16 VITALS — BP 155/91 | HR 69 | Temp 98.2°F | Ht 64.0 in | Wt 199.0 lb

## 2013-09-16 DIAGNOSIS — Z3200 Encounter for pregnancy test, result unknown: Secondary | ICD-10-CM

## 2013-09-16 DIAGNOSIS — A499 Bacterial infection, unspecified: Secondary | ICD-10-CM

## 2013-09-16 DIAGNOSIS — I10 Essential (primary) hypertension: Secondary | ICD-10-CM

## 2013-09-16 DIAGNOSIS — M25561 Pain in right knee: Secondary | ICD-10-CM

## 2013-09-16 DIAGNOSIS — N760 Acute vaginitis: Secondary | ICD-10-CM

## 2013-09-16 DIAGNOSIS — B9689 Other specified bacterial agents as the cause of diseases classified elsewhere: Secondary | ICD-10-CM

## 2013-09-16 DIAGNOSIS — M25569 Pain in unspecified knee: Secondary | ICD-10-CM

## 2013-09-16 LAB — POCT URINE PREGNANCY: Preg Test, Ur: NEGATIVE

## 2013-09-16 LAB — POCT URINALYSIS DIPSTICK
Bilirubin, UA: NEGATIVE
Glucose, UA: NEGATIVE
Ketones, UA: NEGATIVE
Leukocytes, UA: NEGATIVE
Nitrite, UA: NEGATIVE
RBC UA: NEGATIVE
SPEC GRAV UA: 1.02
Urobilinogen, UA: 1
pH, UA: 6.5

## 2013-09-16 MED ORDER — METRONIDAZOLE 500 MG PO TABS: 500.0000 mg | ORAL_TABLET | Freq: Two times a day (BID) | ORAL | Status: DC

## 2013-09-16 NOTE — Patient Instructions (Signed)
-  You may have bacterial vaginosis.  -Start taking metronidazole 500mg  twice daily for 7 days.  -Use condoms to prevent recurrence.  -Follow up with [place] if your symptoms do not improve with treatment.   Please bring your medicines with you each time you come.   Medicines may be  Eye drops  Herbal   Vitamins  Pills  Seeing these help [place] take care of you.  Bacterial Vaginosis Bacterial vaginosis is a vaginal infection that occurs when the normal balance of bacteria in the vagina is disrupted. It results from an overgrowth of certain bacteria. This is the most common vaginal infection in women of childbearing age. Treatment is important to prevent complications, especially in pregnant women, as it can cause a premature delivery. CAUSES  Bacterial vaginosis is caused by an increase in harmful bacteria that are normally present in smaller amounts in the vagina. Several different kinds of bacteria can cause bacterial vaginosis. However, the reason that the condition develops is not fully understood. RISK FACTORS Certain activities or behaviors can put you at an increased risk of developing bacterial vaginosis, including:  Having a new sex partner or multiple sex partners.  Douching.  Using an intrauterine device (IUD) for contraception. Women do not get bacterial vaginosis from toilet seats, bedding, swimming pools, or contact with objects around them. SIGNS AND SYMPTOMS  Some women with bacterial vaginosis have no signs or symptoms. Common symptoms include:  Grey vaginal discharge.  A fishlike odor with discharge, especially after sexual intercourse.  Itching or burning of the vagina and vulva.  Burning or pain with urination. DIAGNOSIS  Your health care provider will take a medical history and examine the vagina for signs of bacterial vaginosis. A sample of vaginal fluid may be taken. Your health care provider will look at this sample under a microscope to check for bacteria and  abnormal cells. A vaginal pH test may also be done.  TREATMENT  Bacterial vaginosis may be treated with antibiotic medicines. These may be given in the form of a pill or a vaginal cream. A second round of antibiotics may be prescribed if the condition comes back after treatment.  HOME CARE INSTRUCTIONS   Only take over-the-counter or prescription medicines as directed by your health care provider.  If antibiotic medicine was prescribed, take it as directed. Make sure you finish it even if you start to feel better.  Do not have sex until treatment is completed.  Tell all sexual partners that you have a vaginal infection. They should see their health care provider and be treated if they have problems, such as a mild rash or itching.  Practice safe sex by using condoms and only having one sex partner. SEEK MEDICAL CARE IF:   Your symptoms are not improving after 3 days of treatment.  You have increased discharge or pain.  You have a fever. MAKE SURE YOU:   Understand these instructions.  Will watch your condition.  Will get help right away if you are not doing well or get worse. FOR MORE INFORMATION  Centers for Disease Control and Prevention, Division of STD Prevention: AppraiserFraud.fi American Sexual Health Association (ASHA): www.ashastd.org  Document Released: 01/01/2005 Document Revised: 10/22/2012 Document Reviewed: 08/13/2012 [facility] Patient Information 2015 [place], [place]. This information is not intended to replace advice given to you by your health care provider. Make sure you discuss any questions you have with your health care provider.

## 2013-09-16 NOTE — Assessment & Plan Note (Signed)
BP Readings from Last 3 Encounters:  09/16/13 155/91  07/31/13 137/87  06/29/13 158/97    Lab Results  Component Value Date   NA 138 06/04/2013   K 3.6 06/04/2013   CREATININE 0.71 06/04/2013    Assessment: Blood pressure control:  Not controlled Progress toward BP goal:   Not at goal Comments: She is on bisoprolol-HCTZ 5-6.25mg  daily and is compliant with this medication. She states that she has right knee pain but will be seen her Orthopedic surgeon this afternoon.   Plan: Medications:  continue current medications Educational resources provided: brochure;handout;video Self management tools provided:   Other plans: Continue to monitor

## 2013-09-16 NOTE — Assessment & Plan Note (Addendum)
She states that her right knee pain is worse today but will see her Orthopedic Surgeon this afternoon for pain management.

## 2013-09-16 NOTE — Progress Notes (Signed)
   Subjective:    Patient ID: Carrie Franco, female    DOB: 10-Dec-1965, 48 y.o.   MRN: [id number]  HPI Ms. Needs is a 48 year old woman with PMH of hypertension, hyperlipidemia, asthma, allergic rhinitis, eczema, and recurrent BV who presents with chief complaint of vaginal discharge and odor.  She states that she was treated for vaginal discharge last month with improvement of her symptoms but 5 days ago she started having white vaginal discharge with "fishy" odor again. She tells me she does not use vaginal creams or douching, she eats yogurt with live culture every day. However, she does not wear condoms. She is sexually active with the same female partner for the past 2 years. Her partner is asymptomatic.   She denies vaginal bleeding or itching, as well as increased urinary frequency or dysuria. Her LPM was over one year ago. She is s/p bilateral tubal ligation.   Review of Systems  Constitutional: Negative for fever, chills, activity change, appetite change, fatigue and unexpected weight change.  Respiratory: Negative for cough and shortness of breath.   Gastrointestinal: Negative for nausea, vomiting, abdominal pain and abdominal distention.  Genitourinary: Positive for vaginal discharge. Negative for dysuria, frequency, vaginal bleeding, difficulty urinating and vaginal pain.  Musculoskeletal:       Right knee pain  Skin: Negative for rash.  Neurological: Negative for dizziness, light-headedness and headaches.  Psychiatric/Behavioral: Negative for agitation.       Objective:   Physical Exam  Nursing note and vitals reviewed. Constitutional: She is oriented to person, place, and time. She appears well-developed and well-nourished. No distress.  Cardiovascular: Normal rate.   Pulmonary/Chest: Effort normal. No respiratory distress.  Genitourinary:  Patient declined pelvic exam, performed vaginal swab test herself  Musculoskeletal:  Right knee with sleeve, no surrounding edema.     Neurological: She is alert and oriented to person, place, and time. Coordination normal.  Skin: Skin is warm and dry.  Psychiatric: She has a normal mood and affect. Her behavior is normal.          Assessment & Plan:

## 2013-09-16 NOTE — Assessment & Plan Note (Addendum)
She presents today with complains of white discharge with "fishy" odor that is concerning for bacterial vaginosis, however, she has not had wet prep positive for Gardnerella vanigitis, making bacterial vaginitis a more likely diagnosis. This is a recurrent problem for her and she did not feel like a full pelvic exam was needed today, she did her own vaginal swab for the wet prep.  She is in a monogamous relationship for the past two years with the same female partner.  In July she was treated for bacterial vaginitis with clindamycin and states that her symptoms resolved but they returned 5 days ago.  Urine dipstick is negative for UA. Urine preg test is negative (of note, she is s/p tubal ligation and postmenopausal).  She does not use douche and does not use vaginal creams -Pt was counseled to use condoms  -Rx metronidazole 500mg  BID for 5 days -Ordered wet prep, urine GC/Chlamydia  Addendum: wet prep resulted with Gardnerella, negative for trichomonas and Candida. Continue treatment with metronidazole as above.

## 2013-09-17 NOTE — Progress Notes (Signed)
Case discussed with Dr. Kennerly soon after the resident saw the patient.  We reviewed the resident's history and exam and pertinent patient test results.  I agree with the assessment, diagnosis, and plan of care documented in the resident's note. 

## 2013-09-22 ENCOUNTER — Encounter: Payer: No Typology Code available for payment source | Admitting: Rehabilitation

## 2013-09-28 ENCOUNTER — Encounter: Payer: No Typology Code available for payment source | Admitting: Rehabilitation

## 2013-09-30 ENCOUNTER — Encounter: Payer: No Typology Code available for payment source | Admitting: Rehabilitation

## 2013-10-09 ENCOUNTER — Other Ambulatory Visit: Payer: Self-pay | Admitting: Internal Medicine

## 2013-10-13 ENCOUNTER — Ambulatory Visit: Payer: No Typology Code available for payment source | Admitting: Internal Medicine

## 2013-10-14 ENCOUNTER — Ambulatory Visit (INDEPENDENT_AMBULATORY_CARE_PROVIDER_SITE_OTHER): Payer: No Typology Code available for payment source | Admitting: Internal Medicine

## 2013-10-14 ENCOUNTER — Encounter: Payer: Self-pay | Admitting: Internal Medicine

## 2013-10-14 VITALS — BP 142/88 | HR 59 | Temp 97.8°F | Ht 64.0 in | Wt 199.1 lb

## 2013-10-14 DIAGNOSIS — N760 Acute vaginitis: Secondary | ICD-10-CM

## 2013-10-14 DIAGNOSIS — N393 Stress incontinence (female) (male): Secondary | ICD-10-CM | POA: Insufficient documentation

## 2013-10-14 DIAGNOSIS — B9689 Other specified bacterial agents as the cause of diseases classified elsewhere: Secondary | ICD-10-CM

## 2013-10-14 DIAGNOSIS — I10 Essential (primary) hypertension: Secondary | ICD-10-CM

## 2013-10-14 LAB — POCT URINALYSIS DIPSTICK
BILIRUBIN UA: NEGATIVE
Blood, UA: NEGATIVE
Glucose, UA: NEGATIVE
KETONES UA: NEGATIVE
Nitrite, UA: NEGATIVE
PH UA: 7
Protein, UA: NEGATIVE
SPEC GRAV UA: 1.015
Urobilinogen, UA: 1

## 2013-10-14 MED ORDER — BISOPROLOL-HYDROCHLOROTHIAZIDE 5-6.25 MG PO TABS: 1.0000 | ORAL_TABLET | Freq: Every day | ORAL | Status: DC

## 2013-10-14 MED ORDER — METRONIDAZOLE 500 MG PO TABS: 500.0000 mg | ORAL_TABLET | Freq: Two times a day (BID) | ORAL | Status: DC

## 2013-10-14 NOTE — Assessment & Plan Note (Signed)
BP Readings from Last 3 Encounters:  10/14/13 142/88  09/16/13 155/91  07/31/13 137/87    Lab Results  Component Value Date   NA 138 06/04/2013   K 3.6 06/04/2013   CREATININE 0.71 06/04/2013    Assessment: Blood pressure control:  controlled Progress toward BP goal:   only slightly elevated Comments: She is on bisoprolol-HCTZ 5-6.25 daily  Plan: Medications:  continue current medications Educational resources provided:   Self management tools provided:   Other plans: Follow up in 3 months.

## 2013-10-14 NOTE — Progress Notes (Signed)
   Subjective:    Patient ID: Carrie Franco, female    DOB: [birth year], 48 y.o.   MRN: [id number]  HPI Ms. Tullis is a 48 year old woman with PMH of hypertension, hyperlipidemia, asthma, allergic rhinitis, eczema, and recurrent BV who presents for evaluation of persistent vaginal discharge and stress incontinence.  She states that she went out of town and forgot to take her Flagyl Rx with her and had only partial treatment. She reports that her boyfriend broke up with her because he refused to wear condoms.  She reports leaking of urine when she coughs or sneezes but denies dysuria, increased urinary frequency.   Review of Systems  Constitutional: Negative for fever, chills, diaphoresis, activity change, appetite change and fatigue.  Respiratory: Negative for cough.   Gastrointestinal: Negative for abdominal pain.  Genitourinary: Positive for vaginal discharge. Negative for dysuria, frequency, vaginal bleeding and vaginal pain.  Neurological: Negative for dizziness.  Psychiatric/Behavioral: Negative for agitation.       Objective:   Physical Exam  Nursing note and vitals reviewed. Constitutional: She is oriented to person, place, and time. She appears well-developed and well-nourished. No distress.  Cardiovascular: Normal rate.   Pulmonary/Chest: Effort normal. No respiratory distress.  Neurological: She is alert and oriented to person, place, and time. Coordination normal.  Skin: Skin is warm and dry. She is not diaphoretic.  Psychiatric: She has a normal mood and affect.          Assessment & Plan:

## 2013-10-14 NOTE — Assessment & Plan Note (Signed)
She describes leaking of urine when she coughs or sneezes. Likely due to stress incontinence in this postmenopausal woman.  Checked urine dipstick which was negative for UTI.  -Counseled pt on Kegel's exercises--provided information on this

## 2013-10-14 NOTE — Patient Instructions (Signed)
-Your blood pressure is improved today.  -Start taking metronidazole 500mg  twice per day , take it for 7 days. Avoid alcohol while taking this medication.  -You likely have stress incontinence. Start doing Kegel's exercises per the instructions below.   Please bring your medicines with you each time you come.   Medicines may be  Eye drops  Herbal   Vitamins  Pills  Seeing these help [place] take care of you.    Kegel Exercises The goal of Kegel exercises is to isolate and exercise your pelvic floor muscles. These muscles act as a hammock that supports the rectum, vagina, small intestine, and uterus. As the muscles weaken, the hammock sags and these organs are displaced from their normal positions. Kegel exercises can strengthen your pelvic floor muscles and help you to improve bladder and bowel control, improve sexual response, and help reduce many problems and some discomfort during pregnancy. Kegel exercises can be done anywhere and at any time. HOW TO PERFORM KEGEL EXERCISES 1. Locate your pelvic floor muscles. To do this, squeeze (contract) the muscles that you use when you try to stop the flow of urine. You will feel a tightness in the vaginal area (women) and a tight lift in the rectal area (men and women). 2. When you begin, contract your pelvic muscles tight for 2-5 seconds, then relax them for 2-5 seconds. This is one set. Do 4-5 sets with a short pause in between. 3. Contract your pelvic muscles for 8-10 seconds, then relax them for 8-10 seconds. Do 4-5 sets. If you cannot contract your pelvic muscles for 8-10 seconds, try 5-7 seconds and work your way up to 8-10 seconds. Your goal is 4-5 sets of 10 contractions each day. Keep your stomach, buttocks, and legs relaxed during the exercises. Perform sets of both short and long contractions. Vary your positions. Perform these contractions 3-4 times per day. Perform sets while you are:   Lying in bed in the morning.  Standing at  lunch.  Sitting in the late afternoon.  Lying in bed at night. You should do 40-50 contractions per day. Do not perform more Kegel exercises per day than recommended. Overexercising can cause muscle fatigue. Continue these exercises for for at least 15-20 weeks or as directed by your caregiver. Document Released: 12/19/2011 Document Reviewed: 12/19/2011 [facility] Patient Information 2015 [place]. This information is not intended to replace advice given to you by your health care provider. Make sure you discuss any questions you have with your health care provider.    Stress Incontinence Urinary stress incontinence occurs when an activity, such as coughing or sneezing, causes a small amount of urine to leak from the urethra, which is the tube urine passes through. Stress incontinence (SI) is the most common type of incontinence suffered by women, especially older women. In addition, women who have given birth are more likely to have stress incontinence.  What Causes Stress Incontinence?  With stress incontinence, movements and activities such as coughing, sneezing, and lifting put greater abdominal pressure on the bladder. That causes the leakage of urine.  A number of things can contribute to stress incontinence. For instance, it can result from weak muscles in the pelvic floor or a weak sphincter muscle at the neck of the bladder. A problem with the way the sphincter muscle opens and closes can also result in stress incontinence. Chronic coughing, smoking, and obesity may also lead to SI.  Stress incontinence, especially in women, is often caused by physical changes  to the body. Things that can cause these changes include:  Pregnancy and childbirth Menstruation Menopause Pelvic surgery Problems with muscles in the bladder -- the organ that holds urine -- and the urethra Weakened muscles around the bladder In cases of stress incontinence, the muscles in the pelvis can weaken. This  can cause the bladder to drop down into a position that prevents the urethra from closing completely. The result is a leakage of urine.  What Are the Symptoms of Stress Incontinence?   The main symptom of stress incontinence is a leakage of urine at times of physical movement or activity. Examples of the kinds of activities associated with urine leaking include laughing, coughing, lifting, or exercise. The leakage may be as little as a drop or two, or may be a "squirt," or even a stream of urine.  How Is Stress Incontinence Treated?  Self-help techniques and aids can be used to treat mild stress incontinence. In addition, there are a number of treatments available for stress incontinence:  Kegel exercises: Kegel exercises, also called pelvic floor exercises, help strengthen the muscles that support the bladder, uterus, and bowels. By strengthening these muscles, you can reduce or prevent leakage problems.  How Is Stress Incontinence Treated? continued...  To do Kegel exercises, pretend you are trying to stop the flow of urine or trying not to pass gas. When you do this, you are contracting the muscles of the pelvic floor. While doing these exercises, try not to move your leg, buttock, or abdominal muscles. In fact, no one should be able to tell that you are doing Kegel exercises.  Kegel exercises should be done every day, five sets a day. Each time you contract the muscles of the pelvic floor, hold for a slow count of five and then relax. Repeat this 10 times for one set of Kegels.  Weight loss: Stress incontinence has been linked to obesity.  Timed voiding: Record the times that you urinate and when you leak urine. This will give you an idea of your leakage "patterns" so that you can avoid leaking in the future by going to the bathroom at those times.  Bladder training: In bladder training, you "stretch out" the intervals at which you go to the bathroom by waiting a little longer before you  go. For instance, to start, you can plan to go to the bathroom once an hour. You follow this pattern for a period of time. Then you change the schedule to going to the bathroom every 90 minutes. Eventually you change it to every two hours and continue to lengthen the time until you are up to three or four hours between bathroom visits.  Another method is to try to postpone a visit to the bathroom for 15 minutes with the first urge. Do this for two weeks and then increase the amount of time to 30 minutes and so on.  Device: The doctor can insert a device called a pessary into the vagina to stop stress incontinence. A pessary is a ring that, when inserted, puts pressure on the urethra in order to keep it in its normal location. Doing so can reduce urine leakage. Possible side effects from using a pessary include vaginal discharge and infections.  Injections: Bulking agents are substances that are injected into the lining of the urethra. They increase the size of the urethra lining. Increasing the size creates resistance against the flow of urine. Collagen is one bulking agent that is commonly used. If successful, periodic injections  may be needed.  How Is Stress Incontinence Treated? continued...  Surgery: When other methods for treating stress incontinence don't work, surgery may be an option. Surgery is now minimally invasive and performed on an outpatient basis in most cases. There are three types of surgery designed to help keep the bladder in place and treat stress incontinence:  Retropubic suspension: In this procedure, the surgeon makes an incision in the abdomen. The surgeon then attaches the neck of the bladder to the pubic bone with sutures. Sling procedure: In this procedure, the surgeon uses a sling made of either natural (human) tissue or synthetic material. The sling goes around the urethra or bladder neck and is attached to the pubic bone. Artificial sphincters: Most frequently used for  men but also may be appropriate for women. A fluid-filled cuff is implanted around the urethra that can be opened and closed by the patient and that serves as a valve to contain the bladder content that might otherwise leak. These surgeries can effectively treat the vast majority of stress incontinence cases. Side effects of surgery include continued or worsened incontinence or an inability to urinate.  WebMD Medical Reference SOURCES:  National Kidney and Urological Diseases Information Clearinghouse: "Urinary Incontinence in Women" and "Treatments for Urinary Incontinence in Women." Reviewed by Richardson Landry, MD on August 09, 2013  2015 WebMD, [facility]. All rights reserved. My Notes:

## 2013-10-14 NOTE — Assessment & Plan Note (Addendum)
Unfortunately she had only partial treatment for BV and has recurrent symptoms.  Rx flagyl 500mg  BID x 7days Pt encouraged to wear condoms to prevent recurrence.

## 2013-10-16 NOTE — Progress Notes (Signed)
INTERNAL MEDICINE TEACHING ATTENDING ADDENDUM - Audreena Sachdeva, MD: I reviewed and discussed at the time of visit with the resident Dr. Kennerly, the patient's medical history, physical examination, diagnosis and results of pertinent tests and treatment and I agree with the patient's care as documented.  

## 2013-10-20 ENCOUNTER — Ambulatory Visit: Payer: No Typology Code available for payment source | Admitting: Rehabilitation

## 2013-10-20 ENCOUNTER — Ambulatory Visit: Payer: No Typology Code available for payment source | Attending: Internal Medicine

## 2013-10-20 DIAGNOSIS — Z9889 Other specified postprocedural states: Secondary | ICD-10-CM | POA: Diagnosis not present

## 2013-10-20 DIAGNOSIS — R262 Difficulty in walking, not elsewhere classified: Secondary | ICD-10-CM | POA: Diagnosis not present

## 2013-10-20 DIAGNOSIS — M25561 Pain in right knee: Secondary | ICD-10-CM | POA: Insufficient documentation

## 2013-10-20 DIAGNOSIS — M25661 Stiffness of right knee, not elsewhere classified: Secondary | ICD-10-CM | POA: Insufficient documentation

## 2013-10-20 DIAGNOSIS — I10 Essential (primary) hypertension: Secondary | ICD-10-CM | POA: Insufficient documentation

## 2013-10-21 ENCOUNTER — Encounter: Payer: Self-pay | Admitting: Obstetrics & Gynecology

## 2013-10-21 ENCOUNTER — Ambulatory Visit (INDEPENDENT_AMBULATORY_CARE_PROVIDER_SITE_OTHER): Payer: No Typology Code available for payment source | Admitting: Obstetrics & Gynecology

## 2013-10-21 VITALS — BP 143/91 | HR 67 | Temp 97.8°F | Ht 65.0 in | Wt 197.7 lb

## 2013-10-21 DIAGNOSIS — N760 Acute vaginitis: Secondary | ICD-10-CM

## 2013-10-21 MED ORDER — BORIC ACID CRYS: 600.0000 mg | CRYSTALS | Status: DC

## 2013-10-21 MED ORDER — METRONIDAZOLE 500 MG PO TABS: 500.0000 mg | ORAL_TABLET | Freq: Two times a day (BID) | ORAL | Status: AC

## 2013-10-21 NOTE — Patient Instructions (Signed)

## 2013-10-22 NOTE — Progress Notes (Signed)
   CLINIC ENCOUNTER NOTE  History:  48 y.o. [id number] here today for management of recurrent BV; referred from PCP. Multiple episodes of BV confirmed on Affirm testing over past few months.  No other acute GYN concerns.  No current symptoms of BV, was recently treated.   The following portions of the patient's history were reviewed and updated as appropriate: allergies, current medications, past family history, past medical history, past social history, past surgical history and problem list. Normal pap and negative HRHPV on 10/03/12.  Normal mammogram on 12/01/12.    Review of Systems:  Pertinent items are noted in HPI.  Objective:  Physical Exam BP 143/91  Pulse 67  Temp(Src) 97.8 F (36.6 C) (Oral)  Ht 5\' 5"  (1.651 m)  Wt 197 lb 11.2 oz (89.676 kg)  BMI 32.90 kg/m2  LMP 11/22/2012 Gen: NAD Abd: Soft, nontender and nondistended Pelvic: Normal appearing external genitalia; normal appearing vaginal mucosa and cervix.  Normal discharge.  Small uterus, no other palpable masses, no uterine or adnexal tenderness  Assessment & Plan:  Proper vulvar hygiene emphasized: discussed avoidance of perfumed soaps, detergents, lotions and any type of douches; in addition to wearing cotton underwear and no underwear at night.   Recommended prolonged treatment course with Metrogel; patient repoerts that she is unable to afford this. Boric acid capsules prescribed, this will help maintain proper pH balance of the vagina and help prevent BV. Metronidazole pills prescribed as needed for recurrent episodes. Reevaluate in 2 months. Routine preventative health maintenance measures emphasized.    Verita Schneiders, MD, [place] Attending [place] for [employer], [place]

## 2013-10-26 ENCOUNTER — Ambulatory Visit: Payer: No Typology Code available for payment source

## 2013-10-29 DIAGNOSIS — G894 Chronic pain syndrome: Secondary | ICD-10-CM | POA: Insufficient documentation

## 2013-10-29 DIAGNOSIS — Z9889 Other specified postprocedural states: Secondary | ICD-10-CM | POA: Insufficient documentation

## 2013-11-05 ENCOUNTER — Encounter: Payer: No Typology Code available for payment source | Admitting: Internal Medicine

## 2013-11-06 ENCOUNTER — Encounter: Payer: Self-pay | Admitting: Internal Medicine

## 2013-11-16 ENCOUNTER — Encounter: Payer: Self-pay | Admitting: Obstetrics & Gynecology

## 2014-01-12 ENCOUNTER — Ambulatory Visit (INDEPENDENT_AMBULATORY_CARE_PROVIDER_SITE_OTHER): Payer: No Typology Code available for payment source | Admitting: Internal Medicine

## 2014-01-12 ENCOUNTER — Encounter: Payer: Self-pay | Admitting: Internal Medicine

## 2014-01-12 VITALS — BP 135/89 | HR 90 | Temp 98.3°F | Ht 64.0 in | Wt 202.5 lb

## 2014-01-12 DIAGNOSIS — M25562 Pain in left knee: Secondary | ICD-10-CM

## 2014-01-12 DIAGNOSIS — Z0000 Encounter for general adult medical examination without abnormal findings: Secondary | ICD-10-CM

## 2014-01-12 DIAGNOSIS — Z7951 Long term (current) use of inhaled steroids: Secondary | ICD-10-CM

## 2014-01-12 DIAGNOSIS — M25561 Pain in right knee: Secondary | ICD-10-CM

## 2014-01-12 DIAGNOSIS — J4520 Mild intermittent asthma, uncomplicated: Secondary | ICD-10-CM

## 2014-01-12 DIAGNOSIS — J45909 Unspecified asthma, uncomplicated: Secondary | ICD-10-CM

## 2014-01-12 DIAGNOSIS — I10 Essential (primary) hypertension: Secondary | ICD-10-CM

## 2014-01-12 MED ORDER — MELOXICAM 7.5 MG PO TABS: 7.5000 mg | ORAL_TABLET | Freq: Every day | ORAL | Status: DC

## 2014-01-12 MED ORDER — AMLODIPINE BESYLATE 5 MG PO TABS: 5.0000 mg | ORAL_TABLET | Freq: Every day | ORAL | Status: DC

## 2014-01-12 NOTE — Patient Instructions (Addendum)
General Instructions: -Start taking Mobic 7.5mg  once per day for the knee pain.  -Do not take Ibuprofen while taking Mobic.  -They will call you with the referral to Sports Medicine.  -You are due for your mammogram. Call them to make an appointment.  -Follow up in 6 months for routine visit.   Please bring your medicines with you each time you come to clinic.  Medicines may include prescription medications, over-the-counter medications, herbal remedies, eye drops, vitamins, or other pills.   Progress Toward Treatment Goals:  Treatment Goal 01/12/2013  Blood pressure at goal    Self Care Goals & Plans:  Self Care Goal 01/12/2014  Manage my medications take my medicines as prescribed; bring my medications to every visit; refill my medications on time  Monitor my health -  Eat healthy foods drink diet soda or water instead of juice or soda; eat more vegetables; eat foods that are low in salt; eat baked foods instead of fried foods; eat fruit for snacks and desserts  Be physically active -  Meeting treatment goals -    No flowsheet data found.   Care Management & Community Referrals:  Referral 01/12/2013  Referrals made for care management support none needed  Referrals made to community resources -     RICE: Routine Care for Injuries Rest, Ice, Compression, and Elevation (RICE) are often used to care for injuries. HOME CARE  Rest your injury.  Put ice on the injury.  Put ice in a plastic bag.  Place a towel between your skin and the bag.  Leave the ice on for 15-20 minutes, 03-04 times a day. Do this for as long as told by your doctor.  Apply pressure (compression) with an elastic bandage. Remove and reapply the bandage every 3 to 4 hours. Do not wrap the bandage too tight. Wrap the bandage looser if the fingers or toes are puffy (swollen), blue, cold, painful, or lose feeling (numb).  Raise (elevate) your injury. Raise your injury above the heart if you can. GET  HELP RIGHT AWAY IF:  You have lasting pain or puffiness.  Your injury is red, weak, or loses feeling.  Your problems get worse, not better, after several days. MAKE SURE YOU:  Understand these instructions.  Will watch your condition.  Will get help right away if you are not doing well or get worse. Document Released: 06/20/2007 Document Revised: 03/26/2011 Document Reviewed: 06/02/2010 [facility] Patient Information 2015 [place], [place]. This information is not intended to replace advice given to you by your health care provider. Make sure you discuss any questions you have with your health care provider.

## 2014-01-13 NOTE — Progress Notes (Signed)
   Subjective:    Patient ID: Carrie Franco, female    DOB: 1965-06-25, 48 y.o.   MRN: [id number]  HPI Carrie Franco is a 48 yr old woman with PMH of HTN, presenting for evaluation of bilateral knee pain that started one week ago. Denies trauma/injury to the knees, knee locking or giving out. She has taken Tylenol and Ibuprofen with no much relief. She has had similar symptoms this year in the summer with negative Xrays, thought to be due to stress/overuse at that time and improvement of her symptoms after rest.  She also requests refill for her antihypertensives.   Review of Systems  Constitutional: Negative for fever, chills, appetite change, fatigue and unexpected weight change.  Respiratory: Negative for cough and shortness of breath.   Cardiovascular: Negative for chest pain and leg swelling.  Gastrointestinal: Negative for abdominal pain.  Genitourinary: Negative for dysuria.  Musculoskeletal: Positive for arthralgias. Negative for back pain and joint swelling.  Skin: Negative for rash.  Neurological: Negative for dizziness, weakness and numbness.  Psychiatric/Behavioral: Negative for agitation.       Objective:   Physical Exam  Constitutional: She is oriented to person, place, and time. She appears well-developed and well-nourished. No distress.  Wearing high-heel boots  Cardiovascular: Normal rate.   Pulmonary/Chest: Effort normal. No respiratory distress.  Musculoskeletal: She exhibits tenderness. She exhibits no edema.  Mild TTP in the medial aspect on her bilateral knees with no effusion or increased warmth.  Strength 5/5 in bilateral LE  Neurological: She is alert and oriented to person, place, and time. Coordination normal.  Skin: Skin is warm and dry. She is not diaphoretic.  Psychiatric: She has a normal mood and affect.  Nursing note and vitals reviewed.         Assessment & Plan:

## 2014-01-14 NOTE — Progress Notes (Signed)
Medicine attending: Medical history, presenting problems, physical findings, and medications, reviewed with Dr Kennerly on the day of the patient encounter and I concur with her evaluation and management plan. 

## 2014-01-14 NOTE — Assessment & Plan Note (Signed)
Likely due to overuse/stress on knees. Infectious process unlikely given no fever/ chills, no effusion, no increased warmth of knees.  She is obese and has been counseled on weight loss for decreased pressure on her joints.  Will treat with conservative management with RICE therapy :rest, ice, compression, elevation.  Mobic 7.5mg  daily x7 days.  Referral to Sports medicine is symptoms persist--may need PT

## 2014-01-14 NOTE — Assessment & Plan Note (Signed)
BP Readings from Last 3 Encounters:  01/12/14 135/89  10/21/13 143/91  10/14/13 142/88    Lab Results  Component Value Date   NA 138 06/04/2013   K 3.6 06/04/2013   CREATININE 0.71 06/04/2013    Assessment: Blood pressure control:  controlled Progress toward BP goal:   at goal Comments: On norvasc 5mg  daily  Plan: Medications:  continue current medications Educational resources provided: brochure (has information) Self management tools provided:   Other plans: Follow up in 6 months.

## 2014-01-14 NOTE — Assessment & Plan Note (Signed)
Stable, refilled albuterol inhaler

## 2014-01-14 NOTE — Assessment & Plan Note (Signed)
Declines flu vaccine, she will call the schedule her mammogram

## 2014-01-19 ENCOUNTER — Other Ambulatory Visit: Payer: Self-pay | Admitting: Internal Medicine

## 2014-01-19 DIAGNOSIS — Z1231 Encounter for screening mammogram for malignant neoplasm of breast: Secondary | ICD-10-CM

## 2014-01-26 ENCOUNTER — Ambulatory Visit: Payer: No Typology Code available for payment source | Admitting: Sports Medicine

## 2014-01-27 ENCOUNTER — Ambulatory Visit: Payer: No Typology Code available for payment source | Admitting: Sports Medicine

## 2014-02-12 ENCOUNTER — Ambulatory Visit (INDEPENDENT_AMBULATORY_CARE_PROVIDER_SITE_OTHER): Payer: No Typology Code available for payment source | Admitting: Family Medicine

## 2014-02-12 ENCOUNTER — Encounter: Payer: Self-pay | Admitting: Family Medicine

## 2014-02-12 VITALS — BP 146/88 | Ht 65.0 in | Wt 200.0 lb

## 2014-02-12 DIAGNOSIS — M25562 Pain in left knee: Secondary | ICD-10-CM

## 2014-02-12 DIAGNOSIS — M25461 Effusion, right knee: Secondary | ICD-10-CM

## 2014-02-12 DIAGNOSIS — Z9889 Other specified postprocedural states: Secondary | ICD-10-CM

## 2014-02-12 DIAGNOSIS — M25561 Pain in right knee: Secondary | ICD-10-CM

## 2014-02-12 DIAGNOSIS — M23321 Other meniscus derangements, posterior horn of medial meniscus, right knee: Secondary | ICD-10-CM

## 2014-02-12 MED ORDER — KETOROLAC TROMETHAMINE 60 MG/2ML IM SOLN
60.0000 mg | Freq: Once | INTRAMUSCULAR | Status: AC
  Administered 2014-02-12: 60 mg via INTRAMUSCULAR

## 2014-02-12 NOTE — Progress Notes (Signed)
Carrie Franco - 49 y.o. female MRN [id number]  Date of birth: [birth year]  SUBJECTIVE: CC: bilateral knee pain, right worse than left, initial visit HPI: Patient reports long-standing history of right knee pain. She is undergone arthroscopic debridement for medial meniscal tear and middle of 2015 at [facility]. She has been having persistent knee effusions and mechanical symptoms since the surgery. She has had multiple aspirations and injections most recently one month ago. She is frustrated and is here for a second opinion. She has been referred to pain management to discuss potential nerve ablations for her persistent pain. Today she reports having significant stiffness and difficulty fully extending or bending her knee which is consistent with her knee being swollen.  ROS: per HPI.   HISTORY:  Past Medical, Surgical, Social, and Family History reviewed & updated per EMR.  Pertinent Historical Findings include:  reports that she has quit smoking. Her smoking use included Cigarettes. She does not have any smokeless tobacco history on file. 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 Medications reviewed pertinent for Norco 05/18/2023 when necessary.  MRI dated 11/27/2013, post arthroscopic debridement shows possible persistent flap of medial meniscus posterior superiorly. She does have tricompartment degenerative changes as moderate in the medial compartment and persistent moderate joint effusion on MRI. 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  OBJECTIVE:  VS:   HT:5\' 5"  (165.1 cm)   WT:200 lb (90.719 kg)  BMI:33.4          BP:(!) 146/88 mmHg  HR: bpm  TEMP: ( )  RESP:   PHYSICAL EXAM:  Adult after American female no  acute distress she is alert and appropriately interactive. Overall good insight. Bilateral lower extremities while lying no significant lower extremity edema. Dorsalis pedis and posterior tibialis pulses 2+/4. Lower extremity strength is 5/5 in all myotomes. Lower extremity sensation is intact to light touch in all lower extremity dermatomes. RIGHT KNEE: Overall well aligned. No significant genu valgus or varus. Significant right-sided effusion and ballotable patella. Loss of contours. She is stable to varus and valgus strain anterior posterior drawer. She has medial joint line tenderness. Positive McMurray's.  DATA OBTAINED DURING VISIT:   Limited [facility] Ultrasound of Right Kee: Findings: Patella & Patellar Tendon: Normal Quad & Quad Tendon: Normal Suprapatellar Pouch: Marked effusion Medial Joint Line: Abnormal- Extruded meniscus, posterior Lateral Joint Line: Normal Posterior Knee: n/a  Impression: The above findings are consistent with significant effusion and posterior medial meniscus degenerative tear    ASSESSMENT: 1. Bilateral knee pain   2. Knee effusion, right   3. Medial meniscus, posterior horn derangement, right    Problem  Chronic Pain Associated With Significant Psychosocial Dysfunction  H/O Arthroscopy of Knee     PROCEDURES: PROCEDURE NOTE : Ultrasound Guided right knee arthrocentesis and injection Injection The risks, benefits and expected outcomes of the injection were reviewed and she wishes to undergo the above named procedure.  Written consent was obtained. After an appropriate time out was taken, the ultrasound was used to identify the target structure and adjacent vascular structures. The right knee was then prepped in a sterile fashion using alcohol and a skin wheal was obtained using 3 mL cc of 1% lidocaine on a 25-gauge 1-1/2 inch needle.  The area was cleaned again  with alcohol and the US probe was prepped in sterile fashion with sterile ultrasound jelly.  Under  direct visualization with real time Ultrasound guidance the target structure was initially aspirated. 45 mL of yellow, slightly turbid straw-colored synovial fluid was withdrawn with out difficulty. Syringes were exchanged and the knee was injected as below:             Needle:  18-gauge 1-1/2 inch needle                Meds:  4 mL 1% lidocaine and 60 mg Toradol             Images: Obtained A bandaid was applied to the area. This procedure was well tolerated and there were no complications.     PLAN: See problem based charting & AVS for additional documentation.  Aspiration and Toradol injection today  Body Helix full knee compression sleeve provided today. Discussed using during activity as well as for the first 2 hours following activity and PRN.  Patient requested referral for surgical consultation in [place]. Refer to Dr. Marlou Sa for consideration of repeat arthroscopic debridement  HEP: Quad sets encouraged bilaterally > Follow up with [facility] orthopedics

## 2014-02-12 NOTE — Patient Instructions (Addendum)
DR Marlou Sa MON 02/15/14 @ [address] [place] [facility] [phone]

## 2014-02-13 LAB — SYNOVIAL CELL COUNT + DIFF, W/ CRYSTALS
Crystals, Fluid: NONE SEEN
Eosinophils-Synovial: 0 % (ref 0–1)
LYMPHOCYTES-SYNOVIAL FLD: 54 % — AB (ref 0–20)
MONOCYTE/MACROPHAGE: 39 % — AB (ref 50–90)
Neutrophil, Synovial: 7 % (ref 0–25)
WBC, Synovial: 495 cu mm — ABNORMAL HIGH (ref 0–200)

## 2014-02-17 ENCOUNTER — Encounter: Payer: Self-pay | Admitting: Internal Medicine

## 2014-02-17 ENCOUNTER — Ambulatory Visit (INDEPENDENT_AMBULATORY_CARE_PROVIDER_SITE_OTHER): Payer: No Typology Code available for payment source | Admitting: Internal Medicine

## 2014-02-17 VITALS — BP 145/90 | HR 100 | Temp 98.2°F | Ht 65.0 in | Wt 207.3 lb

## 2014-02-17 DIAGNOSIS — H1131 Conjunctival hemorrhage, right eye: Secondary | ICD-10-CM

## 2014-02-17 DIAGNOSIS — M25561 Pain in right knee: Secondary | ICD-10-CM

## 2014-02-17 DIAGNOSIS — E785 Hyperlipidemia, unspecified: Secondary | ICD-10-CM

## 2014-02-17 DIAGNOSIS — M25562 Pain in left knee: Secondary | ICD-10-CM

## 2014-02-17 LAB — POCT GLYCOSYLATED HEMOGLOBIN (HGB A1C): HEMOGLOBIN A1C: 5.4

## 2014-02-17 LAB — LIPID PANEL
CHOLESTEROL: 199 mg/dL (ref 0–200)
HDL: 53 mg/dL (ref >39)
LDL CALC: 129 mg/dL — AB (ref 0–99)
Total CHOL/HDL Ratio: 3.8 Ratio
Triglycerides: 84 mg/dL (ref <150)
VLDL: 17 mg/dL (ref 0–40)

## 2014-02-17 LAB — GLUCOSE, CAPILLARY: Glucose-Capillary: 107 mg/dL — ABNORMAL HIGH (ref 70–99)

## 2014-02-17 MED ORDER — IBUPROFEN 800 MG PO TABS: 800.0000 mg | ORAL_TABLET | Freq: Four times a day (QID) | ORAL | Status: DC | PRN

## 2014-02-17 NOTE — Assessment & Plan Note (Signed)
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

## 2014-02-17 NOTE — Patient Instructions (Addendum)
Please try to bring all your medicines next time. This will help [place] keep you safe from mistakes.   If you start to experience eye pain, worsening redness, headache, please get help immediately.   If you start to experience abdominal pain, please stop taking your ibuprofen.

## 2014-02-17 NOTE — Assessment & Plan Note (Signed)
Repeated lipid panel & A1c today.

## 2014-02-17 NOTE — Assessment & Plan Note (Signed)
Overview -Seen by Sports Medicine last week and was given knee compression sleeve -Feels she is developing similar symptoms on L knee since having shifted her weight while walking -Due to see Dr. Marlou Sa [Orthopedics] in 2 days -Asking for refill of her ibuprofen 800mg   Assessment -Medial meniscal tear suspect per [facility] US done at Sports Medicine last week  Chandler her to limit the number of ibuprofen she takes daily and to stop if she experiences GI side effects -Explained that is can cause her BP to be high -Will need to request records from Dr. Marlou Sa

## 2014-02-17 NOTE — Progress Notes (Signed)
   Subjective:    Patient ID: Carrie Franco, female    DOB: 1965-03-31, 49 y.o.   MRN: [id number]  HPI Carrie Franco is a 49 year old woman with HTN, HLD bilateral knee pain who presents with redness of her R eye. Please see assessment & plan for documentation of each problem.   Review of Systems  Constitutional: Negative for fever.  Eyes: Positive for redness. Negative for photophobia, pain, discharge and visual disturbance.  Gastrointestinal: Negative for nausea, vomiting and abdominal pain.  Musculoskeletal: Positive for arthralgias and gait problem.  Neurological: Negative for dizziness and headaches.       Objective:   Physical Exam Constitutional: She is oriented to person, place, and time. She appears well-developed and well-nourished. No distress.  HENT:  Head: Normocephalic and atraumatic.  Eyes: Conjunctivae are normal. Pupils are equal, round, and reactive to light. Optic disk visualized bilaterally on fundoscopic exam.     Cardiovascular: Normal rate, regular rhythm and normal heart sounds.  Exam reveals no gallop and no friction rub.   No murmur heard. Pulmonary/Chest: Effort normal. No respiratory distress. She has no wheezes. She has no rales.  Abdominal: Soft. Bowel sounds are normal. She exhibits no distension. There is no tenderness.  Neurological: She is alert and oriented to person, place, and time. No cranial nerve deficit. Coordination normal.  Skin: Skin is warm and dry. She is not diaphoretic.  Psychiatric: Her behavior is normal.         Assessment & Plan:

## 2014-02-18 ENCOUNTER — Ambulatory Visit (HOSPITAL_COMMUNITY): Payer: No Typology Code available for payment source

## 2014-02-22 NOTE — Progress Notes (Signed)
INTERNAL MEDICINE TEACHING ATTENDING ADDENDUM - Carrie Contes, MD: I personally saw and evaluated Carrie Franco in this clinic visit in conjunction with the resident, Dr. Posey Pronto. I have discussed patient's plan of care with medical resident during this visit. I have confirmed the physical exam findings and have read and agree with the clinic note including the plan with the following addition: - Pt with likely subconjunctival hemorrhage secondary to elevated BP - Will monitor for now - Pt to follow up this week for re evaluation

## 2014-02-23 ENCOUNTER — Other Ambulatory Visit: Payer: Self-pay | Admitting: Orthopedic Surgery

## 2014-02-23 DIAGNOSIS — M25562 Pain in left knee: Secondary | ICD-10-CM

## 2014-02-28 ENCOUNTER — Emergency Department (HOSPITAL_COMMUNITY): Payer: No Typology Code available for payment source

## 2014-02-28 ENCOUNTER — Emergency Department (HOSPITAL_COMMUNITY)
Admission: EM | Admit: 2014-02-28 | Discharge: 2014-02-28 | Disposition: A | Discharge status: Home / Self Care | Payer: No Typology Code available for payment source | Attending: Emergency Medicine | Admitting: Emergency Medicine

## 2014-02-28 ENCOUNTER — Encounter (HOSPITAL_COMMUNITY): Payer: Self-pay | Admitting: Emergency Medicine

## 2014-02-28 DIAGNOSIS — Z79899 Other long term (current) drug therapy: Secondary | ICD-10-CM | POA: Diagnosis not present

## 2014-02-28 DIAGNOSIS — Z880 Allergy status to penicillin: Secondary | ICD-10-CM | POA: Diagnosis not present

## 2014-02-28 DIAGNOSIS — Z7952 Long term (current) use of systemic steroids: Secondary | ICD-10-CM | POA: Diagnosis not present

## 2014-02-28 DIAGNOSIS — Z8619 Personal history of other infectious and parasitic diseases: Secondary | ICD-10-CM | POA: Diagnosis not present

## 2014-02-28 DIAGNOSIS — Z8669 Personal history of other diseases of the nervous system and sense organs: Secondary | ICD-10-CM | POA: Insufficient documentation

## 2014-02-28 DIAGNOSIS — Z87440 Personal history of urinary (tract) infections: Secondary | ICD-10-CM | POA: Insufficient documentation

## 2014-02-28 DIAGNOSIS — I10 Essential (primary) hypertension: Secondary | ICD-10-CM | POA: Insufficient documentation

## 2014-02-28 DIAGNOSIS — Z8742 Personal history of other diseases of the female genital tract: Secondary | ICD-10-CM | POA: Insufficient documentation

## 2014-02-28 DIAGNOSIS — R0602 Shortness of breath: Secondary | ICD-10-CM | POA: Diagnosis present

## 2014-02-28 DIAGNOSIS — Z8719 Personal history of other diseases of the digestive system: Secondary | ICD-10-CM | POA: Insufficient documentation

## 2014-02-28 DIAGNOSIS — Z8639 Personal history of other endocrine, nutritional and metabolic disease: Secondary | ICD-10-CM | POA: Insufficient documentation

## 2014-02-28 DIAGNOSIS — J45901 Unspecified asthma with (acute) exacerbation: Secondary | ICD-10-CM | POA: Diagnosis not present

## 2014-02-28 DIAGNOSIS — Z791 Long term (current) use of non-steroidal anti-inflammatories (NSAID): Secondary | ICD-10-CM | POA: Insufficient documentation

## 2014-02-28 DIAGNOSIS — J9809 Other diseases of bronchus, not elsewhere classified: Secondary | ICD-10-CM

## 2014-02-28 DIAGNOSIS — Z872 Personal history of diseases of the skin and subcutaneous tissue: Secondary | ICD-10-CM | POA: Diagnosis not present

## 2014-02-28 DIAGNOSIS — Z87891 Personal history of nicotine dependence: Secondary | ICD-10-CM | POA: Insufficient documentation

## 2014-02-28 DIAGNOSIS — Z792 Long term (current) use of antibiotics: Secondary | ICD-10-CM | POA: Diagnosis not present

## 2014-02-28 LAB — I-STAT TROPONIN, ED: Troponin i, poc: 0 ng/mL (ref 0.00–0.08)

## 2014-02-28 LAB — BASIC METABOLIC PANEL
Anion gap: 6 (ref 5–15)
BUN: 11 mg/dL (ref 6–23)
CO2: 26 mmol/L (ref 19–32)
Calcium: 9.4 mg/dL (ref 8.4–10.5)
Chloride: 104 mmol/L (ref 96–112)
Creatinine, Ser: 0.6 mg/dL (ref 0.50–1.10)
GFR calc Af Amer: >90 mL/min (ref >90)
GFR calc non Af Amer: >90 mL/min (ref >90)
GLUCOSE: 134 mg/dL — AB (ref 70–99)
POTASSIUM: 3.4 mmol/L — AB (ref 3.5–5.1)
Sodium: 136 mmol/L (ref 135–145)

## 2014-02-28 LAB — CBC
HCT: 33.2 % — ABNORMAL LOW (ref 36.0–46.0)
HEMOGLOBIN: 11 g/dL — AB (ref 12.0–15.0)
MCH: 27.5 pg (ref 26.0–34.0)
MCHC: 33.1 g/dL (ref 30.0–36.0)
MCV: 83 fL (ref 78.0–100.0)
PLATELETS: 284 10*3/uL (ref 150–400)
RBC: 4 MIL/uL (ref 3.87–5.11)
RDW: 13.9 % (ref 11.5–15.5)
WBC: 6.3 10*3/uL (ref 4.0–10.5)

## 2014-02-28 MED ORDER — ALBUTEROL SULFATE HFA 108 (90 BASE) MCG/ACT IN AERS
2.0000 | INHALATION_SPRAY | RESPIRATORY_TRACT | Status: DC | PRN
  Administered 2014-02-28: 2 via RESPIRATORY_TRACT
  Filled 2014-02-28: qty 6.7

## 2014-02-28 MED ORDER — ALBUTEROL SULFATE (2.5 MG/3ML) 0.083% IN NEBU
2.5000 mg | INHALATION_SOLUTION | Freq: Once | RESPIRATORY_TRACT | Status: AC
  Administered 2014-02-28: 2.5 mg via RESPIRATORY_TRACT
  Filled 2014-02-28: qty 3

## 2014-02-28 NOTE — ED Provider Notes (Signed)
CSN: [id number]     Arrival date & time 02/28/14  2015 History   First MD Initiated Contact with Patient 02/28/14 2051     Chief Complaint  Patient presents with  . Shortness of Breath     (Consider location/radiation/quality/duration/timing/severity/associated sxs/prior Treatment) HPI Comments: Issue with a history of mild intermittent asthma presents with a 32nd episode of feeling like her ear was caught on her neck.  She did not use her inhaler.  She came immediately to the emergency department.  She states that she's had 3 episodes like this in the last 2 months that have lasted 20-30 seconds.  He states she feels, like there's something in her throat preventing her from breathing, although she is speaking in complete paragraphs          Patient is a 49 y.o. female presenting with shortness of breath. The history is provided by the patient.  Shortness of Breath Severity:  Mild Onset quality:  Sudden Duration:  1 minute Timing:  Intermittent Progression:  Unchanged Chronicity:  Recurrent Relieved by:  Rest Worsened by:  Nothing tried Ineffective treatments:  None tried Associated symptoms: no chest pain     Past Medical History  Diagnosis Date  . High risk sexual behavior   . Hypertension   . Bacterial vaginitis     recurrent  . Recurrent UTI   . Genital herpes   . Recurrent boils   . External hemorrhoids with complication   . Allergic rhinitis   . Chronic constipation   . History of cocaine abuse 1992  . History of tobacco abuse 1999  . Bilateral carpal tunnel syndrome   . Acne   . Hyperlipidemia   . Asthma    Past Surgical History  Procedure Laterality Date  . Tummy tuck  03/2010   Family History  Problem Relation Age of Onset  . Cancer Mother   . Diabetes Other    History  Substance Use Topics  . Smoking status: Former Smoker    Types: Cigarettes  . Smokeless tobacco: Not on file     Comment: quit 54yrs ago  . Alcohol Use: No   OB History    Gravida Para Term Preterm AB TAB SAB Ectopic Multiple Living   4 3   1  1   3      Review of Systems  HENT: Negative for rhinorrhea and trouble swallowing.   Respiratory: Positive for shortness of breath.   Cardiovascular: Negative for chest pain.      Allergies  Hydrocodone-acetaminophen; Orange fruit; Orange oil; Meloxicam; and Penicillins  Home Medications   Prior to Admission medications   Medication Sig Start Date End Date Taking? Authorizing Provider  albuterol (PROVENTIL,VENTOLIN) 90 MCG/ACT inhaler Inhale 2 puffs into the lungs every 6 (six) hours as needed for Wheezing. 02/23/10 02/18/11  Jola Schmidt, MD  amitriptyline (ELAVIL) 10 MG tablet 1-3 tabs q hs 10/29/13   Historical Provider, MD  amLODipine (NORVASC) 5 MG tablet Take 1 tablet (5 mg total) by mouth daily. 01/12/14 01/12/15  Blain Pais, MD  amLODipine (NORVASC) 5 MG tablet Take by mouth. 01/12/14 01/12/15  Historical Provider, MD  celecoxib (CELEBREX) 200 MG capsule Take 200 mg by mouth. 02/03/14 03/05/14  Historical Provider, MD  cyclobenzaprine (FLEXERIL) 10 MG tablet One po tid 12/30/13   Historical Provider, MD  cyclobenzaprine (FLEXERIL) 10 MG tablet  12/30/13   Historical Provider, MD  diclofenac sodium (VOLTAREN) 1 % GEL QID prn to affected area, 2g maximum on  one area at a time 02/03/14 03/05/14  Historical Provider, MD  erythromycin base (E-MYCIN) 500 MG tablet  11/20/13   Historical Provider, MD  HYDROcodone-acetaminophen (NORCO) 5-325 MG per tablet Take 1 tablet by mouth. 09/16/13   Historical Provider, MD  HYDROcodone-acetaminophen (NORCO/VICODIN) 5-325 MG per tablet Take 1 tablet by mouth. 01/06/14   Historical Provider, MD  ibuprofen (ADVIL,MOTRIN) 800 MG tablet Take 1 tablet (800 mg total) by mouth every 6 (six) hours as needed. 02/17/14   Charlott Rakes, MD  loratadine (CLARITIN) 10 MG tablet Take 1 tablet (10 mg total) by mouth daily as needed. For allergies 03/19/13   Dorian Heckle, MD  meloxicam (MOBIC)  7.5 MG tablet Take 1 tablet (7.5 mg total) by mouth daily. 01/12/14   Blain Pais, MD  meloxicam (MOBIC) 7.5 MG tablet Take by mouth. 01/12/14   Historical Provider, MD  metroNIDAZOLE (FLAGYL) 500 MG tablet  12/16/13   Historical Provider, MD  omeprazole (PRILOSEC OTC) 20 MG tablet Take 1 tablet (20 mg total) by mouth daily. 03/19/13   Dorian Heckle, MD  triamcinolone (KENALOG) 0.025 % ointment Apply 1 application topically 2 (two) times daily. To dry skin on back and torso 06/04/13   Othella Boyer, MD  VOLTAREN 1 % GEL  02/03/14   Historical Provider, MD   BP 135/76 mmHg  Pulse 92  Temp(Src) 98.3 F (36.8 C) (Oral)  Resp 24  Ht 5\' 4"  (1.626 m)  Wt 206 lb (93.441 kg)  BMI 35.34 kg/m2  SpO2 96%  LMP 11/22/2012 Physical Exam  Constitutional: She appears well-developed and well-nourished.  Eyes: Pupils are equal, round, and reactive to light.  Neck: Normal range of motion.  Cardiovascular: Normal rate and regular rhythm.   Pulmonary/Chest: Effort normal and breath sounds normal. No respiratory distress. She has no wheezes. She exhibits no tenderness.  Abdominal: Soft.  Musculoskeletal: Normal range of motion.  Neurological: She is alert.  Skin: Skin is warm.  Nursing note and vitals reviewed.   ED Course  Procedures (including critical care time) Labs Review Labs Reviewed  CBC - Abnormal; Notable for the following:    Hemoglobin 11.0 (*)    HCT 33.2 (*)    All other components within normal limits  BASIC METABOLIC PANEL - Abnormal; Notable for the following:    Potassium 3.4 (*)    Glucose, Bld 134 (*)    All other components within normal limits  Randolm Idol, ED    Imaging Review Dg Chest 2 View  02/28/2014   CLINICAL DATA:  Difficulty breathing today.  EXAM: CHEST  2 VIEW  COMPARISON:  PA and lateral chest 03/18/2011.  FINDINGS: Heart size and mediastinal contours are within normal limits. Both lungs are clear. Visualized skeletal structures are unremarkable.   IMPRESSION: Negative exam.   Electronically Signed   By: Inge Rise M.D.   On: 02/28/2014 21:01     EKG Interpretation None     Since labs and chest x-ray, reviewed all within normal parameters.  She feels better after using the albuterol nebulization.  She'll be sent home with a inhaler with insertion to follow-up with her primary care physician MDM   Final diagnoses:  Recurrent bronchospasm         Garald Balding, NP 02/28/14 3557  Orlie Dakin, MD 03/01/14 [phone]

## 2014-02-28 NOTE — ED Notes (Signed)
Patient with shortness of breath, she states that she was in the car with significant other and she could not catch her breath.  Patient states that it was sudden.  She states that it has happened twice before.  She denies any chest pain with it.

## 2014-02-28 NOTE — Discharge Instructions (Signed)
When you havean "attack" please use your inhaler   Make an appointment with your PCP for follow up

## 2014-02-28 NOTE — ED Notes (Signed)
Pt A&OX4, ambulatory at d/c with steady gait, NAD 

## 2014-03-06 ENCOUNTER — Ambulatory Visit
Admission: RE | Admit: 2014-03-06 | Discharge: 2014-03-06 | Disposition: A | Discharge status: Home / Self Care | Payer: No Typology Code available for payment source | Source: Ambulatory Visit | Attending: Orthopedic Surgery | Admitting: Orthopedic Surgery

## 2014-03-06 DIAGNOSIS — M25562 Pain in left knee: Secondary | ICD-10-CM

## 2014-03-24 ENCOUNTER — Encounter: Payer: Self-pay | Admitting: Internal Medicine

## 2014-03-24 ENCOUNTER — Ambulatory Visit (INDEPENDENT_AMBULATORY_CARE_PROVIDER_SITE_OTHER): Payer: No Typology Code available for payment source | Admitting: Internal Medicine

## 2014-03-24 VITALS — BP 150/79 | HR 98 | Temp 98.0°F | Wt 210.7 lb

## 2014-03-24 DIAGNOSIS — R0681 Apnea, not elsewhere classified: Secondary | ICD-10-CM | POA: Insufficient documentation

## 2014-03-24 DIAGNOSIS — M170 Bilateral primary osteoarthritis of knee: Secondary | ICD-10-CM

## 2014-03-24 DIAGNOSIS — A499 Bacterial infection, unspecified: Secondary | ICD-10-CM

## 2014-03-24 DIAGNOSIS — N760 Acute vaginitis: Secondary | ICD-10-CM

## 2014-03-24 DIAGNOSIS — I10 Essential (primary) hypertension: Secondary | ICD-10-CM | POA: Insufficient documentation

## 2014-03-24 DIAGNOSIS — B9689 Other specified bacterial agents as the cause of diseases classified elsewhere: Secondary | ICD-10-CM

## 2014-03-24 DIAGNOSIS — Z1239 Encounter for other screening for malignant neoplasm of breast: Secondary | ICD-10-CM

## 2014-03-24 DIAGNOSIS — K219 Gastro-esophageal reflux disease without esophagitis: Secondary | ICD-10-CM | POA: Insufficient documentation

## 2014-03-24 MED ORDER — PANTOPRAZOLE SODIUM 40 MG PO TBEC: 40.0000 mg | DELAYED_RELEASE_TABLET | Freq: Every day | ORAL | Status: DC

## 2014-03-24 MED ORDER — ACETAMINOPHEN 500 MG PO TABS: 500.0000 mg | ORAL_TABLET | Freq: Four times a day (QID) | ORAL | Status: DC | PRN

## 2014-03-24 MED ORDER — METRONIDAZOLE 500 MG PO TABS: 500.0000 mg | ORAL_TABLET | Freq: Two times a day (BID) | ORAL | Status: DC

## 2014-03-24 MED ORDER — AMLODIPINE BESYLATE 10 MG PO TABS: 10.0000 mg | ORAL_TABLET | Freq: Every day | ORAL | Status: DC

## 2014-03-24 NOTE — Patient Instructions (Signed)
General Instructions: -Stop taking Motrin, Ibuprofen, or Aleve. Start taking Tylenol 500mg  every 6 hours as needed for the pain in your knees.  -Start taking metronidazole 500mg  twice per day for 7 days.  -Start taking Protonix 40mg  daily for your esophagus.  -You have a sleep study ordered. They will call you with the appointment date and time.  -Follow up with [place] in 3 months or sooner as needed.   Please bring your medicines with you each time you come to clinic.  Medicines may include prescription medications, over-the-counter medications, herbal remedies, eye drops, vitamins, or other pills.   Progress Toward Treatment Goals:  Treatment Goal 01/12/2013  Blood pressure at goal    Self Care Goals & Plans:  Self Care Goal 03/24/2014  Manage my medications take my medicines as prescribed; bring my medications to every visit; refill my medications on time  Monitor my health keep track of my blood pressure  Eat healthy foods eat more vegetables; eat foods that are low in salt  Be physically active find an activity I enjoy  Meeting treatment goals -    No flowsheet data found.   Care Management & Community Referrals:  Referral 01/12/2013  Referrals made for care management support none needed  Referrals made to community resources -

## 2014-03-24 NOTE — Progress Notes (Signed)
   Subjective:    Patient ID: Carrie Franco, female    DOB: 30-Jul-1965, 49 y.o.   MRN: [id number]  HPI Carrie Franco is a 49 yr old woman with PMH of asthma, HTN, presenting for evaluation of apnea while sleeping and once occasional of apnea while resting. This has been going on since the beginning of the year. She is concerned it may be due to esophageal spasm/heartburn.  She also complains of recurrent BV symptoms.  She continues to have bilateral knee pain and tells me that she has been told she needs BL total knee replacement.    Review of Systems  Constitutional: Negative for fever, chills, diaphoresis, activity change, appetite change, fatigue and unexpected weight change.  HENT: Negative for congestion, sore throat, trouble swallowing and voice change.   Respiratory: Positive for apnea. Negative for cough, choking, shortness of breath and wheezing.   Cardiovascular: Negative for chest pain and leg swelling.  Gastrointestinal: Negative for vomiting, abdominal pain, diarrhea, constipation and blood in stool.  Genitourinary: Positive for vaginal discharge. Negative for dysuria, difficulty urinating and vaginal pain.  Musculoskeletal: Positive for arthralgias. Negative for gait problem.  Skin: Negative for rash.  Neurological: Negative for dizziness and light-headedness.  Psychiatric/Behavioral: Negative for agitation.       Objective:   Physical Exam  Constitutional: She is oriented to person, place, and time. She appears well-developed and well-nourished. No distress.  HENT:  Nose: Nose normal.  Mouth/Throat: Oropharynx is clear and moist. No oropharyngeal exudate.  Cardiovascular: Normal rate.   Pulmonary/Chest: Effort normal and breath sounds normal. No respiratory distress. She has no wheezes. She has no rales.  Abdominal: Soft. Bowel sounds are normal. She exhibits no distension. There is no tenderness. There is no rebound and no guarding.  Genitourinary:  Declines pelvic exam    Neurological: She is alert and oriented to person, place, and time. Coordination normal.  Skin: Skin is warm and dry. She is not diaphoretic.  Psychiatric: She has a normal mood and affect.  Nursing note and vitals reviewed.         Assessment & Plan:

## 2014-03-25 DIAGNOSIS — N760 Acute vaginitis: Secondary | ICD-10-CM

## 2014-03-25 DIAGNOSIS — B9689 Other specified bacterial agents as the cause of diseases classified elsewhere: Secondary | ICD-10-CM | POA: Insufficient documentation

## 2014-03-25 NOTE — Assessment & Plan Note (Addendum)
Could be due to esophageal spasm v OSA. Denies SOB, trauma to the chest, chest palpitations. Anxiety could also be a cause of her symptoms.   Start PPI Ordered split night study

## 2014-03-25 NOTE — Assessment & Plan Note (Signed)
Related to sexual intercourse, pt suspects she is allergic to sperm. Her partner is asymptomatic, pt declines pelvic exam or STD screening.   Rx metronidazole 500mg  BID x7 days

## 2014-03-25 NOTE — Assessment & Plan Note (Signed)
Ordered mammogram, pt will call to have digital mammogram.

## 2014-03-25 NOTE — Assessment & Plan Note (Signed)
BP Readings from Last 3 Encounters:  03/24/14 150/79  02/28/14 133/89  02/17/14 145/90    Lab Results  Component Value Date   NA 136 02/28/2014   K 3.4* 02/28/2014   CREATININE 0.60 02/28/2014    Assessment: Blood pressure control:  not controlled Progress toward BP goal:   not at goal Comments: on Norvasc 10mg  daily but in pain during this visit.   Plan: Medications:  continue current medications Educational resources provided:   Self management tools provided:   Other plans: Follow up in 3 months

## 2014-03-25 NOTE — Assessment & Plan Note (Signed)
Start Tylenol 500mg  1-2 tablet q6hr for pain. Avoid NSAIDs given her HTN.

## 2014-03-25 NOTE — Progress Notes (Signed)
Medicine attending: Medical history, presenting problems, physical findings, and medications, reviewed with Dr Soli Kennerly on the day of the patient visit and I concur with her evaluation and management plan. 

## 2014-03-25 NOTE — Assessment & Plan Note (Signed)
Start protonix 40 mg daily

## 2014-04-01 ENCOUNTER — Ambulatory Visit (HOSPITAL_COMMUNITY): Payer: No Typology Code available for payment source | Attending: Oncology

## 2014-04-09 ENCOUNTER — Other Ambulatory Visit: Payer: Self-pay | Admitting: Orthopedic Surgery

## 2014-04-09 DIAGNOSIS — R609 Edema, unspecified: Secondary | ICD-10-CM

## 2014-04-09 DIAGNOSIS — M79604 Pain in right leg: Secondary | ICD-10-CM

## 2014-04-12 ENCOUNTER — Encounter (HOSPITAL_COMMUNITY): Payer: Self-pay

## 2014-04-12 ENCOUNTER — Emergency Department (HOSPITAL_COMMUNITY)
Admission: EM | Admit: 2014-04-12 | Discharge: 2014-04-12 | Disposition: A | Discharge status: Home / Self Care | Payer: No Typology Code available for payment source | Attending: Emergency Medicine | Admitting: Emergency Medicine

## 2014-04-12 DIAGNOSIS — Y9389 Activity, other specified: Secondary | ICD-10-CM | POA: Diagnosis not present

## 2014-04-12 DIAGNOSIS — Z8669 Personal history of other diseases of the nervous system and sense organs: Secondary | ICD-10-CM | POA: Insufficient documentation

## 2014-04-12 DIAGNOSIS — Z8742 Personal history of other diseases of the female genital tract: Secondary | ICD-10-CM | POA: Diagnosis not present

## 2014-04-12 DIAGNOSIS — Y9289 Other specified places as the place of occurrence of the external cause: Secondary | ICD-10-CM | POA: Insufficient documentation

## 2014-04-12 DIAGNOSIS — Z7952 Long term (current) use of systemic steroids: Secondary | ICD-10-CM | POA: Diagnosis not present

## 2014-04-12 DIAGNOSIS — J45909 Unspecified asthma, uncomplicated: Secondary | ICD-10-CM | POA: Diagnosis not present

## 2014-04-12 DIAGNOSIS — Z872 Personal history of diseases of the skin and subcutaneous tissue: Secondary | ICD-10-CM | POA: Diagnosis not present

## 2014-04-12 DIAGNOSIS — Z87440 Personal history of urinary (tract) infections: Secondary | ICD-10-CM | POA: Insufficient documentation

## 2014-04-12 DIAGNOSIS — Z87891 Personal history of nicotine dependence: Secondary | ICD-10-CM | POA: Insufficient documentation

## 2014-04-12 DIAGNOSIS — Z880 Allergy status to penicillin: Secondary | ICD-10-CM | POA: Diagnosis not present

## 2014-04-12 DIAGNOSIS — Z8619 Personal history of other infectious and parasitic diseases: Secondary | ICD-10-CM | POA: Diagnosis not present

## 2014-04-12 DIAGNOSIS — Z79899 Other long term (current) drug therapy: Secondary | ICD-10-CM | POA: Insufficient documentation

## 2014-04-12 DIAGNOSIS — I10 Essential (primary) hypertension: Secondary | ICD-10-CM | POA: Insufficient documentation

## 2014-04-12 DIAGNOSIS — X58XXXA Exposure to other specified factors, initial encounter: Secondary | ICD-10-CM | POA: Insufficient documentation

## 2014-04-12 DIAGNOSIS — Z86718 Personal history of other venous thrombosis and embolism: Secondary | ICD-10-CM | POA: Diagnosis not present

## 2014-04-12 DIAGNOSIS — Z792 Long term (current) use of antibiotics: Secondary | ICD-10-CM | POA: Diagnosis not present

## 2014-04-12 DIAGNOSIS — S86911A Strain of unspecified muscle(s) and tendon(s) at lower leg level, right leg, initial encounter: Secondary | ICD-10-CM | POA: Diagnosis not present

## 2014-04-12 DIAGNOSIS — Y998 Other external cause status: Secondary | ICD-10-CM | POA: Insufficient documentation

## 2014-04-12 DIAGNOSIS — Z8719 Personal history of other diseases of the digestive system: Secondary | ICD-10-CM | POA: Diagnosis not present

## 2014-04-12 DIAGNOSIS — S86811A Strain of other muscle(s) and tendon(s) at lower leg level, right leg, initial encounter: Secondary | ICD-10-CM

## 2014-04-12 DIAGNOSIS — M79661 Pain in right lower leg: Secondary | ICD-10-CM | POA: Diagnosis present

## 2014-04-12 MED ORDER — CYCLOBENZAPRINE HCL 5 MG PO TABS: 5.0000 mg | ORAL_TABLET | Freq: Three times a day (TID) | ORAL | Status: DC | PRN

## 2014-04-12 MED ORDER — CYCLOBENZAPRINE HCL 10 MG PO TABS
5.0000 mg | ORAL_TABLET | Freq: Once | ORAL | Status: AC
  Administered 2014-04-12: 5 mg via ORAL
  Filled 2014-04-12: qty 1

## 2014-04-12 MED ORDER — NAPROXEN 250 MG PO TABS
500.0000 mg | ORAL_TABLET | Freq: Once | ORAL | Status: AC
  Administered 2014-04-12: 500 mg via ORAL
  Filled 2014-04-12: qty 2

## 2014-04-12 NOTE — ED Notes (Signed)
Patient transported to Ultrasound 

## 2014-04-12 NOTE — ED Notes (Signed)
PT ambulated with baseline gait; VSS; A&Ox3; no signs of distress; respirations even and unlabored; skin warm and dry; no questions upon discharge.  

## 2014-04-12 NOTE — ED Notes (Signed)
Pt here for possible DVT in right leg, reports she has an appt for [place] next Wednesday but she cant take the pain any longer and request we do the [place] here.

## 2014-04-12 NOTE — Progress Notes (Signed)
*  PRELIMINARY RESULTS* Vascular Ultrasound Right lower extremity venous duplex has been completed.  Preliminary findings: no evidence of DVT.   Landry Mellow, RDMS, RVT  04/12/2014, 10:50 AM

## 2014-04-12 NOTE — Discharge Instructions (Signed)
Muscle Strain A muscle strain (pulled muscle) happens when a muscle is stretched beyond normal length. It happens when a sudden, violent force stretches your muscle too far. Usually, a few of the fibers in your muscle are torn. Muscle strain is common in athletes. Recovery usually takes 1-2 weeks. Complete healing takes 5-6 weeks.  HOME CARE   Follow the PRICE method of treatment to help your injury get better. Do this the first 2-3 days after the injury:  Protect. Protect the muscle to keep it from getting injured again.  Rest. Limit your activity and rest the injured body part.  Ice. Put ice in a plastic bag. Place a towel between your skin and the bag. Then, apply the ice and leave it on from 15-20 minutes each hour. After the third day, switch to moist heat packs.  Compression. Use a splint or elastic bandage on the injured area for comfort. Do not put it on too tightly.  Elevate. Keep the injured body part above the level of your heart.  Only take medicine as told by your doctor.  Warm up before doing exercise to prevent future muscle strains. GET HELP IF:   You have more pain or puffiness (swelling) in the injured area.  You feel numbness, tingling, or notice a loss of strength in the injured area. MAKE SURE YOU:   Understand these instructions.  Will watch your condition.  Will get help right away if you are not doing well or get worse. Document Released: 10/11/2007 Document Revised: 10/22/2012 Document Reviewed: 07/31/2012 [facility] Patient Information 2015 [place], [place]. This information is not intended to replace advice given to you by your health care provider. Make sure you discuss any questions you have with your health care provider.

## 2014-04-12 NOTE — ED Provider Notes (Signed)
CSN: [id number]     Arrival date & time 04/12/14  5638 History   First MD Initiated Contact with Patient 04/12/14 [phone]     Chief Complaint  Patient presents with  . DVT     (Consider location/radiation/quality/duration/timing/severity/associated sxs/prior Treatment) Patient is a 49 y.o. female presenting with leg pain. The history is provided by the patient.  Leg Pain Location:  Leg Time since incident:  1 week Injury: no   Leg location:  R lower leg Pain details:    Quality:  Aching   Radiates to:  Does not radiate   Severity:  Moderate   Onset quality:  Sudden   Timing:  Constant   Progression:  Unchanged Chronicity:  New Dislocation: no   Foreign body present:  No foreign bodies Tetanus status:  Up to date Prior injury to area:  No Relieved by:  None tried Worsened by:  Bearing weight Ineffective treatments:  None tried Associated symptoms: swelling   Associated symptoms: no back pain, no fatigue, no muscle weakness and no numbness     Past Medical History  Diagnosis Date  . High risk sexual behavior   . Hypertension   . Bacterial vaginitis     recurrent  . Recurrent UTI   . Genital herpes   . Recurrent boils   . External hemorrhoids with complication   . Allergic rhinitis   . Chronic constipation   . History of cocaine abuse 1992  . History of tobacco abuse 1999  . Bilateral carpal tunnel syndrome   . Acne   . Hyperlipidemia   . Asthma    Past Surgical History  Procedure Laterality Date  . Tummy tuck  03/2010   Family History  Problem Relation Age of Onset  . Cancer Mother   . Diabetes Other    History  Substance Use Topics  . Smoking status: Former Smoker    Types: Cigarettes  . Smokeless tobacco: Not on file     Comment: quit 54yrs ago  . Alcohol Use: No   OB History    Gravida Para Term Preterm AB TAB SAB Ectopic Multiple Living   4 3   1  1   3      Review of Systems  Constitutional: Negative for fatigue.  Musculoskeletal: Negative  for back pain.  All other systems reviewed and are negative.     Allergies  Hydrocodone-acetaminophen; Orange fruit; Orange oil; Meloxicam; and Penicillins  Home Medications   Prior to Admission medications   Medication Sig Start Date End Date Taking? Authorizing Provider  acetaminophen (TYLENOL) 500 MG tablet Take 1 tablet (500 mg total) by mouth every 6 (six) hours as needed. Patient taking differently: Take 1,000 mg by mouth every 6 (six) hours as needed.  03/24/14  Yes Blain Pais, MD  amitriptyline (ELAVIL) 10 MG tablet 1-3 tabs q hs 10/29/13  Yes Historical Provider, MD  amLODipine (NORVASC) 10 MG tablet Take 1 tablet (10 mg total) by mouth daily. 03/24/14  Yes Blain Pais, MD  ibuprofen (ADVIL,MOTRIN) 800 MG tablet Take 1,600 mg by mouth 2 (two) times daily. 03/06/14  Yes Historical Provider, MD  pantoprazole (PROTONIX) 40 MG tablet Take 1 tablet (40 mg total) by mouth daily. 03/24/14  Yes Blain Pais, MD  VOLTAREN 1 % GEL Apply 1 application topically 3 (three) times daily.  02/03/14  Yes Historical Provider, MD  albuterol (PROVENTIL,VENTOLIN) 90 MCG/ACT inhaler Inhale 2 puffs into the lungs every 6 (six) hours as needed for  Wheezing. 02/23/10 02/18/11  Jola Schmidt, MD  cyclobenzaprine (FLEXERIL) 5 MG tablet Take 1 tablet (5 mg total) by mouth 3 (three) times daily as needed for muscle spasms. 04/12/14   Larence Penning, MD  loratadine (CLARITIN) 10 MG tablet Take 1 tablet (10 mg total) by mouth daily as needed. For allergies Patient not taking: Reported on 04/12/2014 03/19/13   Dorian Heckle, MD  metroNIDAZOLE (FLAGYL) 500 MG tablet Take 1 tablet (500 mg total) by mouth 2 (two) times daily. Patient not taking: Reported on 04/12/2014 03/24/14   Blain Pais, MD  triamcinolone (KENALOG) 0.025 % ointment Apply 1 application topically 2 (two) times daily. To dry skin on back and torso Patient not taking: Reported on 04/12/2014 06/04/13   Othella Boyer, MD   BP 136/74 mmHg   Pulse 88  Temp(Src) 97.7 F (36.5 C) (Oral)  Resp 18  Ht 5\' 3"  (1.6 m)  Wt 211 lb (95.709 kg)  BMI 37.39 kg/m2  SpO2 93%  LMP 11/22/2012 Physical Exam  Constitutional: She appears well-developed and well-nourished. No distress.  HENT:  Head: Normocephalic and atraumatic.  Mouth/Throat: No oropharyngeal exudate.  Eyes: EOM are normal. Pupils are equal, round, and reactive to light.  Cardiovascular: Normal rate, regular rhythm, normal heart sounds and intact distal pulses.   Pulmonary/Chest: Effort normal and breath sounds normal. No respiratory distress. She has no wheezes. She has no rales.  Musculoskeletal: Normal range of motion. She exhibits tenderness (right posterior calf with muscle spasm noted. No significant edema). She exhibits no edema.  RLE neurovascularly intact.  Skin: Skin is warm and dry. No rash noted. She is not diaphoretic. No pallor.  Psychiatric: She has a normal mood and affect. Her behavior is normal. Judgment and thought content normal.  Nursing note and vitals reviewed.   ED Course  Procedures (including critical care time) Labs Review Labs Reviewed - No data to display  Imaging Review No results found.   EKG Interpretation None      MDM   Final diagnoses:  Strain of calf muscle, right, initial encounter    49 year old female with history of hypertension as well as osteoarthritis of the right knee status post arthroscopy in 2015 presents with right calf pain and concern for DVT. Pain worsening in the last week with this quite increase in swelling. Palpation DVT study ordered by orthopedics but she has had increased pain so she presents today for emergent DVT study.  Afebrile and vital signs stable. No hypoxia, chest pain, shortness of breath. No evidence of peripheral vascular disease with right lower extremity being neurovascularly intact. No significant edema and tenderness in the right posterior calf. Likely musculoskeletal pain but will  evaluate for DVT with ultrasound. Anti-inflammatory and muscle relaxers given.  DVT study negative. Treat with anti-inflammatories and muscle relaxers. Orthopedic follow-up as needed.  Larence Penning, MD 04/12/14 1439  Elnora Morrison, MD 04/12/14 [phone]

## 2014-04-14 ENCOUNTER — Other Ambulatory Visit: Payer: No Typology Code available for payment source

## 2014-04-14 ENCOUNTER — Ambulatory Visit (HOSPITAL_COMMUNITY)
Admission: RE | Admit: 2014-04-14 | Discharge: 2014-04-14 | Disposition: A | Discharge status: Home / Self Care | Payer: No Typology Code available for payment source | Source: Ambulatory Visit | Attending: Oncology | Admitting: Oncology

## 2014-04-14 ENCOUNTER — Other Ambulatory Visit: Payer: Self-pay | Admitting: Internal Medicine

## 2014-04-14 DIAGNOSIS — Z1239 Encounter for other screening for malignant neoplasm of breast: Secondary | ICD-10-CM

## 2014-04-14 DIAGNOSIS — Z1231 Encounter for screening mammogram for malignant neoplasm of breast: Secondary | ICD-10-CM | POA: Insufficient documentation

## 2014-04-23 ENCOUNTER — Other Ambulatory Visit: Payer: Self-pay | Admitting: *Deleted

## 2014-04-23 DIAGNOSIS — N760 Acute vaginitis: Principal | ICD-10-CM

## 2014-04-23 DIAGNOSIS — B9689 Other specified bacterial agents as the cause of diseases classified elsewhere: Secondary | ICD-10-CM

## 2014-04-23 NOTE — Telephone Encounter (Signed)
Pt request a refill on med.  states she has a strong odor and discharge.

## 2014-05-24 ENCOUNTER — Telehealth: Payer: Self-pay | Admitting: *Deleted

## 2014-05-24 ENCOUNTER — Emergency Department (HOSPITAL_COMMUNITY)
Admission: EM | Admit: 2014-05-24 | Discharge: 2014-05-24 | Disposition: A | Discharge status: Home / Self Care | Payer: No Typology Code available for payment source | Attending: Emergency Medicine | Admitting: Emergency Medicine

## 2014-05-24 ENCOUNTER — Encounter (HOSPITAL_COMMUNITY): Payer: Self-pay | Admitting: Emergency Medicine

## 2014-05-24 ENCOUNTER — Emergency Department (HOSPITAL_COMMUNITY): Payer: No Typology Code available for payment source

## 2014-05-24 DIAGNOSIS — Z87440 Personal history of urinary (tract) infections: Secondary | ICD-10-CM | POA: Insufficient documentation

## 2014-05-24 DIAGNOSIS — Z79899 Other long term (current) drug therapy: Secondary | ICD-10-CM | POA: Insufficient documentation

## 2014-05-24 DIAGNOSIS — Z8719 Personal history of other diseases of the digestive system: Secondary | ICD-10-CM | POA: Diagnosis not present

## 2014-05-24 DIAGNOSIS — Z87448 Personal history of other diseases of urinary system: Secondary | ICD-10-CM | POA: Diagnosis not present

## 2014-05-24 DIAGNOSIS — R05 Cough: Secondary | ICD-10-CM | POA: Diagnosis present

## 2014-05-24 DIAGNOSIS — Z8619 Personal history of other infectious and parasitic diseases: Secondary | ICD-10-CM | POA: Insufficient documentation

## 2014-05-24 DIAGNOSIS — J040 Acute laryngitis: Secondary | ICD-10-CM | POA: Insufficient documentation

## 2014-05-24 DIAGNOSIS — Z880 Allergy status to penicillin: Secondary | ICD-10-CM | POA: Insufficient documentation

## 2014-05-24 DIAGNOSIS — J45901 Unspecified asthma with (acute) exacerbation: Secondary | ICD-10-CM | POA: Diagnosis not present

## 2014-05-24 DIAGNOSIS — Z8739 Personal history of other diseases of the musculoskeletal system and connective tissue: Secondary | ICD-10-CM | POA: Insufficient documentation

## 2014-05-24 DIAGNOSIS — Z7952 Long term (current) use of systemic steroids: Secondary | ICD-10-CM | POA: Diagnosis not present

## 2014-05-24 LAB — RAPID STREP SCREEN (MED CTR MEBANE ONLY): Streptococcus, Group A Screen (Direct): NEGATIVE

## 2014-05-24 MED ORDER — ALBUTEROL SULFATE (2.5 MG/3ML) 0.083% IN NEBU
5.0000 mg | INHALATION_SOLUTION | Freq: Once | RESPIRATORY_TRACT | Status: AC
  Administered 2014-05-24: 5 mg via RESPIRATORY_TRACT
  Filled 2014-05-24: qty 6

## 2014-05-24 MED ORDER — MOMETASONE FUROATE 50 MCG/ACT NA SUSP: 2.0000 | Freq: Every day | NASAL | Status: DC

## 2014-05-24 MED ORDER — PREDNISONE 20 MG PO TABS: ORAL_TABLET | ORAL | Status: DC

## 2014-05-24 MED ORDER — IPRATROPIUM BROMIDE 0.02 % IN SOLN
0.5000 mg | Freq: Once | RESPIRATORY_TRACT | Status: AC
  Administered 2014-05-24: 0.5 mg via RESPIRATORY_TRACT
  Filled 2014-05-24: qty 2.5

## 2014-05-24 MED ORDER — PREDNISONE 20 MG PO TABS
60.0000 mg | ORAL_TABLET | Freq: Once | ORAL | Status: AC
  Administered 2014-05-24: 60 mg via ORAL
  Filled 2014-05-24: qty 3

## 2014-05-24 NOTE — ED Provider Notes (Signed)
CSN: [id number]     Arrival date & time 05/24/14  2458 History   First MD Initiated Contact with Patient 05/24/14 1017     Chief Complaint  Patient presents with  . Asthma  . Cough     (Consider location/radiation/quality/duration/timing/severity/associated sxs/prior Treatment) HPI Comments: 49 year old female complaining of wheezing, nonproductive cough, and sore throat 3 days. Reports pressure behind both of her ears and a hoarse voice. States she does not feel congested. No fevers. Tried using albuterol inhaler with no relief. Denies chest pain. Endorses shortness of breath when she is coughing.  The history is provided by the patient.    Past Medical History  Diagnosis Date  . High risk sexual behavior   . Hypertension   . Bacterial vaginitis     recurrent  . Recurrent UTI   . Genital herpes   . Recurrent boils   . External hemorrhoids with complication   . Allergic rhinitis   . Chronic constipation   . History of cocaine abuse 1992  . History of tobacco abuse 1999  . Bilateral carpal tunnel syndrome   . Acne   . Hyperlipidemia   . Asthma    Past Surgical History  Procedure Laterality Date  . Tummy tuck  03/2010   Family History  Problem Relation Age of Onset  . Cancer Mother   . Diabetes Other    History  Substance Use Topics  . Smoking status: Former Smoker    Types: Cigarettes  . Smokeless tobacco: Not on file     Comment: quit 24yrs ago  . Alcohol Use: No   OB History    Gravida Para Term Preterm AB TAB SAB Ectopic Multiple Living   4 3   1  1   3      Review of Systems  HENT: Positive for ear pain, sore throat and voice change.   Respiratory: Positive for cough, shortness of breath and wheezing.   All other systems reviewed and are negative.     Allergies  Hydrocodone-acetaminophen; Orange fruit; Orange oil; Meloxicam; and Penicillins  Home Medications   Prior to Admission medications   Medication Sig Start Date End Date Taking?  Authorizing Provider  acetaminophen (TYLENOL) 500 MG tablet Take 1 tablet (500 mg total) by mouth every 6 (six) hours as needed. Patient taking differently: Take 1,000 mg by mouth every 6 (six) hours as needed.  03/24/14  Yes Blain Pais, MD  amLODipine (NORVASC) 10 MG tablet Take 1 tablet (10 mg total) by mouth daily. 03/24/14  Yes Blain Pais, MD  cyclobenzaprine (FLEXERIL) 5 MG tablet Take 1 tablet (5 mg total) by mouth 3 (three) times daily as needed for muscle spasms. 04/12/14  Yes Larence Penning, MD  OVER THE COUNTER MEDICATION Inject 1 Syringe as directed once a week. hcg injection   Yes Historical Provider, MD  pantoprazole (PROTONIX) 40 MG tablet Take 1 tablet (40 mg total) by mouth daily. 03/24/14  Yes Blain Pais, MD  albuterol (PROVENTIL,VENTOLIN) 90 MCG/ACT inhaler Inhale 2 puffs into the lungs every 6 (six) hours as needed for Wheezing. 02/23/10 02/18/11  Jola Schmidt, MD  amitriptyline (ELAVIL) 10 MG tablet 1-3 tabs q hs 10/29/13   Historical Provider, MD  ibuprofen (ADVIL,MOTRIN) 800 MG tablet Take 1,600 mg by mouth 2 (two) times daily. 03/06/14   Historical Provider, MD  loratadine (CLARITIN) 10 MG tablet Take 1 tablet (10 mg total) by mouth daily as needed. For allergies Patient not taking: Reported on 04/12/2014  03/19/13   Dorian Heckle, MD  metroNIDAZOLE (FLAGYL) 500 MG tablet Take 1 tablet (500 mg total) by mouth 2 (two) times daily. Patient not taking: Reported on 04/12/2014 03/24/14   Blain Pais, MD  mometasone (NASONEX) 50 MCG/ACT nasal spray Place 2 sprays into the nose daily. 05/24/14   Carman Ching, PA-C  predniSONE (DELTASONE) 20 MG tablet 2 tabs po daily x 4 days 05/24/14   Carman Ching, PA-C  triamcinolone (KENALOG) 0.025 % ointment Apply 1 application topically 2 (two) times daily. To dry skin on back and torso Patient not taking: Reported on 04/12/2014 06/04/13   Othella Boyer, MD  VOLTAREN 1 % GEL Apply 1 application topically 3 (three) times daily.   02/03/14   Historical Provider, MD   BP 141/78 mmHg  Pulse 91  Temp(Src) 98.2 F (36.8 C) (Oral)  Resp 22  Ht 5\' 3"  (1.6 m)  Wt 200 lb (90.719 kg)  BMI 35.44 kg/m2  SpO2 100%  LMP 11/22/2012 Physical Exam  Constitutional: She is oriented to person, place, and time. She appears well-developed and well-nourished. No distress.  HENT:  Head: Normocephalic and atraumatic.  Post oropharyngeal erythema without edema or exudate. Nasal mucosal edema. Bilateral TM normal.  Eyes: Conjunctivae and EOM are normal.  Neck: Normal range of motion. Neck supple.  Cardiovascular: Normal rate, regular rhythm and normal heart sounds.   Pulmonary/Chest: Effort normal. No respiratory distress.  Poor air movement. Diffuse expiratory wheezes bilateral.  Musculoskeletal: Normal range of motion. She exhibits no edema.  Neurological: She is alert and oriented to person, place, and time. No sensory deficit.  Skin: Skin is warm and dry.  Psychiatric: She has a normal mood and affect. Her behavior is normal.  Nursing note and vitals reviewed.   ED Course  Procedures (including critical care time) Labs Review Labs Reviewed  RAPID STREP SCREEN  CULTURE, GROUP A STREP    Imaging Review Dg Chest 2 View  05/24/2014   CLINICAL DATA:  Asthma, cough, sore throat  EXAM: CHEST  2 VIEW  COMPARISON:  02/28/2014  FINDINGS: Cardiomediastinal silhouette is stable. No acute infiltrate or pleural effusion. No pulmonary edema. Bony thorax is unremarkable.  IMPRESSION: No active cardiopulmonary disease.   Electronically Signed   By: Lahoma Crocker M.D.   On: 05/24/2014 11:00     EKG Interpretation   Date/Time:  Monday May 24 2014 09:53:56 EDT Ventricular Rate:  100 PR Interval:  148 QRS Duration: 78 QT Interval:  366 QTC Calculation: 472 R Axis:   60 Text Interpretation:  Normal sinus rhythm No significant change since last  tracing Confirmed by Mingo Amber  MD, [place] ([id number]) on 05/24/2014 10:27:10 AM      MDM   Final  diagnoses:  Asthma exacerbation  Laryngitis   Nontoxic appearing, NAD. AF VSS. AF VSS. Chest x-ray obtained to rule out pneumonia, negative. Given oral prednisone and DuoNeb with significant improvement of breath sounds. Reports she is feeling better. Rapid strep negative. Will discharge home with short course of prednisone along with Nasonex. Symptomatic treatment for laryngitis discussed. Follow-up with PCP. Stable for discharge. Return precautions given. Patient states understanding of treatment care plan and is agreeable.  Carman Ching, PA-C 05/24/14 1210  Evelina Bucy, MD 05/24/14 [phone]

## 2014-05-24 NOTE — Telephone Encounter (Signed)
Pt calls and almost does not have a voice, very congested, hoarse. Wheezes are audible. She ask for an appt but it is advised that she go asap to ED, she is agreeable

## 2014-05-24 NOTE — ED Notes (Signed)
Pt reports wheezing for the last 3 days. Inhaler use with no improvement. Pt reports nonproductive cough and sore throat as well.

## 2014-05-24 NOTE — Telephone Encounter (Signed)
Thank you Bonnita Nasuti.. I agree with your recommendation

## 2014-05-24 NOTE — ED Notes (Signed)
Report from matt, rn.  Pt care assumed.

## 2014-05-24 NOTE — Discharge Instructions (Signed)
Use Nasonex as directed. Take prednisone beginning tomorrow as you were given the first dose in the emergency department today. Use albuterol inhaler every 4-6 hours for cough and wheezing. Follow-up with your primary care physician within one week. Asthma, Acute Bronchospasm Acute bronchospasm caused by asthma is also referred to as an asthma attack. Bronchospasm means your air passages become narrowed. The narrowing is caused by inflammation and tightening of the muscles in the air tubes (bronchi) in your lungs. This can make it hard to breathe or cause you to wheeze and cough. CAUSES Possible triggers are:  Animal dander from the skin, hair, or feathers of animals.  Dust mites contained in house dust.  Cockroaches.  Pollen from trees or grass.  Mold.  Cigarette or tobacco smoke.  Air pollutants such as dust, household cleaners, hair sprays, aerosol sprays, paint fumes, strong chemicals, or strong odors.  Cold air or weather changes. Cold air may trigger inflammation. Winds increase molds and pollens in the air.  Strong emotions such as crying or laughing hard.  Stress.  Certain medicines such as aspirin or beta-blockers.  Sulfites in foods and drinks, such as dried fruits and wine.  Infections or inflammatory conditions, such as a flu, cold, or inflammation of the nasal membranes (rhinitis).  Gastroesophageal reflux disease (GERD). GERD is a condition where stomach acid backs up into your esophagus.  Exercise or strenuous activity. SIGNS AND SYMPTOMS   Wheezing.  Excessive coughing, particularly at night.  Chest tightness.  Shortness of breath. DIAGNOSIS  Your health care provider will ask you about your medical history and perform a physical exam. A chest X-ray or blood testing may be performed to look for other causes of your symptoms or other conditions that may have triggered your asthma attack. TREATMENT  Treatment is aimed at reducing inflammation and opening  up the airways in your lungs. Most asthma attacks are treated with inhaled medicines. These include quick relief or rescue medicines (such as bronchodilators) and controller medicines (such as inhaled corticosteroids). These medicines are sometimes given through an inhaler or a nebulizer. Systemic steroid medicine taken by mouth or given through an IV tube also can be used to reduce the inflammation when an attack is moderate or severe. Antibiotic medicines are only used if a bacterial infection is present.  HOME CARE INSTRUCTIONS   Rest.  Drink plenty of liquids. This helps the mucus to remain thin and be easily coughed up. Only use caffeine in moderation and do not use alcohol until you have recovered from your illness.  Do not smoke. Avoid being exposed to secondhand smoke.  You play a critical role in keeping yourself in good health. Avoid exposure to things that cause you to wheeze or to have breathing problems.  Keep your medicines up-to-date and available. Carefully follow your health care provider's treatment plan.  Take your medicine exactly as prescribed.  When pollen or pollution is bad, keep windows closed and use an air conditioner or go to places with air conditioning.  Asthma requires careful medical care. See your health care provider for a follow-up as advised. If you are more than [redacted] weeks pregnant and you were prescribed any new medicines, let your obstetrician know about the visit and how you are doing. Follow up with your health care provider as directed.  After you have recovered from your asthma attack, make an appointment with your outpatient doctor to talk about ways to reduce the likelihood of future attacks. If you do not have  a doctor who manages your asthma, make an appointment with a primary care doctor to discuss your asthma. SEEK IMMEDIATE MEDICAL CARE IF:   You are getting worse.  You have trouble breathing. If severe, call your local emergency services (911  in the [place]  You develop chest pain or discomfort.  You are vomiting.  You are not able to keep fluids down.  You are coughing up yellow, green, brown, or bloody sputum.  You have a fever and your symptoms suddenly get worse.  You have trouble swallowing. MAKE SURE YOU:   Understand these instructions.  Will watch your condition.  Will get help right away if you are not doing well or get worse. Document Released: 04/18/2006 Document Revised: 01/06/2013 Document Reviewed: 07/09/2012 [facility] Patient Information 2015 [place], [place]. This information is not intended to replace advice given to you by your health care provider. Make sure you discuss any questions you have with your health care provider.  Laryngitis At the top of your windpipe is your voice box. It is the source of your voice. Inside your voice box are 2 bands of muscles called vocal cords. When you breathe, your vocal cords are relaxed and open so that air can get into the lungs. When you decide to say something, these cords come together and vibrate. The sound from these vibrations goes into your throat and comes out through your mouth as sound. Laryngitis is an inflammation of the vocal cords that causes hoarseness, cough, loss of voice, sore throat, and dry throat. Laryngitis can be temporary (acute) or long-term (chronic). Most cases of acute laryngitis improve with time.Chronic laryngitis lasts for more than 3 weeks. CAUSES Laryngitis can often be related to excessive smoking, talking, or yelling, as well as inhalation of toxic fumes and allergies. Acute laryngitis is usually caused by a viral infection, vocal strain, measles or mumps, or bacterial infections. Chronic laryngitis is usually caused by vocal cord strain, vocal cord injury, postnasal drip, growths on the vocal cords, or acid reflux. SYMPTOMS   Cough.  Sore throat.  Dry throat. RISK FACTORS  Respiratory infections.  Exposure to irritating  substances, such as cigarette smoke, excessive amounts of alcohol, stomach acids, and workplace chemicals.  Voice trauma, such as vocal cord injury from shouting or speaking too loud. DIAGNOSIS  Your cargiver will perform a physical exam. During the physical exam, your caregiver will examine your throat. The most common sign of laryngitis is hoarseness. Laryngoscopy may be necessary to confirm the diagnosis of this condition. This procedure allows your caregiver to look into the larynx. HOME CARE INSTRUCTIONS  Drink enough fluids to keep your urine clear or pale yellow.  Rest until you no longer have symptoms or as directed by your caregiver.  Breathe in moist air.  Take all medicine as directed by your caregiver.  Do not smoke.  Talk as little as possible (this includes whispering).  Write on paper instead of talking until your voice is back to normal.  Follow up with your caregiver if your condition has not improved after 10 days. SEEK MEDICAL CARE IF:   You have trouble breathing.  You cough up blood.  You have persistent fever.  You have increasing pain.  You have difficulty swallowing. MAKE SURE YOU:  Understand these instructions.  Will watch your condition.  Will get help right away if you are not doing well or get worse. Document Released: 01/01/2005 Document Revised: 03/26/2011 Document Reviewed: 03/09/2010 [facility] Patient Information 2015 [place], [place]. This  information is not intended to replace advice given to you by your health care provider. Make sure you discuss any questions you have with your health care provider. ° °

## 2014-05-24 NOTE — ED Notes (Signed)
Pt in radiology 

## 2014-05-26 LAB — CULTURE, GROUP A STREP: Strep A Culture: NEGATIVE

## 2014-06-01 ENCOUNTER — Encounter (HOSPITAL_BASED_OUTPATIENT_CLINIC_OR_DEPARTMENT_OTHER): Payer: No Typology Code available for payment source

## 2014-06-11 ENCOUNTER — Ambulatory Visit (INDEPENDENT_AMBULATORY_CARE_PROVIDER_SITE_OTHER): Payer: No Typology Code available for payment source | Admitting: Obstetrics & Gynecology

## 2014-06-11 ENCOUNTER — Ambulatory Visit (INDEPENDENT_AMBULATORY_CARE_PROVIDER_SITE_OTHER): Payer: No Typology Code available for payment source | Admitting: Internal Medicine

## 2014-06-11 ENCOUNTER — Encounter: Payer: Self-pay | Admitting: Internal Medicine

## 2014-06-11 ENCOUNTER — Encounter: Payer: Self-pay | Admitting: Obstetrics & Gynecology

## 2014-06-11 VITALS — BP 133/77 | HR 92 | Ht 62.0 in | Wt 203.1 lb

## 2014-06-11 VITALS — BP 143/79 | HR 98 | Temp 97.3°F | Wt 205.0 lb

## 2014-06-11 DIAGNOSIS — N761 Subacute and chronic vaginitis: Secondary | ICD-10-CM | POA: Diagnosis not present

## 2014-06-11 DIAGNOSIS — B9689 Other specified bacterial agents as the cause of diseases classified elsewhere: Secondary | ICD-10-CM | POA: Diagnosis not present

## 2014-06-11 DIAGNOSIS — J4520 Mild intermittent asthma, uncomplicated: Secondary | ICD-10-CM

## 2014-06-11 DIAGNOSIS — I10 Essential (primary) hypertension: Secondary | ICD-10-CM | POA: Diagnosis not present

## 2014-06-11 DIAGNOSIS — J309 Allergic rhinitis, unspecified: Secondary | ICD-10-CM

## 2014-06-11 DIAGNOSIS — E785 Hyperlipidemia, unspecified: Secondary | ICD-10-CM

## 2014-06-11 DIAGNOSIS — N760 Acute vaginitis: Secondary | ICD-10-CM

## 2014-06-11 DIAGNOSIS — Z87891 Personal history of nicotine dependence: Secondary | ICD-10-CM

## 2014-06-11 MED ORDER — FUROSEMIDE 20 MG PO TABS: 20.0000 mg | ORAL_TABLET | ORAL | Status: DC | PRN

## 2014-06-11 MED ORDER — FLUTICASONE PROPIONATE 50 MCG/ACT NA SUSP: 2.0000 | Freq: Every day | NASAL | Status: DC

## 2014-06-11 MED ORDER — LORATADINE 10 MG PO TABS: 10.0000 mg | ORAL_TABLET | Freq: Every day | ORAL | Status: DC

## 2014-06-11 MED ORDER — ALBUTEROL SULFATE HFA 108 (90 BASE) MCG/ACT IN AERS: 2.0000 | INHALATION_SPRAY | Freq: Four times a day (QID) | RESPIRATORY_TRACT | Status: DC | PRN

## 2014-06-11 NOTE — Assessment & Plan Note (Addendum)
Assessment: Pt with mild intermittent asthma with recent flare in setting of seasonal allergic rhinitis flare who presents with no active flare and no requirement for step-up therapy.   Plan: -Continue albuterol inhaler 2 puffs Q 6 hr PRN acute bronchospasm. Pt given spacer with instructions on use. -Continue flonase nasal spray and claritin 10 mg daily for allergic rhinitis

## 2014-06-11 NOTE — Patient Instructions (Signed)
RepHresh vaginal preparation over the counter  Return to clinic for any scheduled appointments or for any gynecologic concerns as needed.

## 2014-06-11 NOTE — Progress Notes (Signed)
Patient ID: Carrie Franco, female   DOB: October 05, 1965, 49 y.o.   MRN: [id number]    Subjective:   Patient ID: Carrie Franco female   DOB: [birth year] 49 y.o.   MRN: [id number]  HPI: CarrieAlayha DAMIAN Franco is a 49 y.o. with past medical history of hypertension, hyperlipidemia, mild intermittent asthma, allergic rhinitis, recurrent BV, and GERD who presents for routine clinic follow-up visit.    She reports compliance with taking amlodipine for hypertension. She was previously taking bisoprolol-HCTZ daily but was for unclear reasons taken off of it. She has chronic b/l LE edema but denies headache, blurry vision, chest pain, or lightheadedness.  She has seasonal allergic rhinitis which triggers her asthma. Her last asthma flare was 05/24/14 which resolved with prednisone and breathing treatments. She uses albuterol infrequently and denies dyspnea, cough, wheezing, chest tightness, or nighttime symptoms. She was also given flonase and occasioanlly takes claritin for allergic rhinitis. She has never had allergy testing and unsure of her triggers but suspects due to outdoor allergens. She has nasal congestion, voice hoarseness, PND, and watery/red eyes.   She has history of recurrent bacterial vaginosis and saw her gynecologist today who recommended rephresh vaginal cream for maintaining pH balance. She was previously instructed to take flagyl gel and boric acid but could not afford either. Her last episode was about a month ago. 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 She has not changed sexual partners and continues to not use condoms.    Past Medical History  Diagnosis Date  . High risk sexual behavior   . Hypertension   . Bacterial vaginitis     recurrent  . Recurrent UTI   . Genital herpes   . Recurrent boils   . External hemorrhoids with complication   . Allergic rhinitis   . Chronic constipation   . History of cocaine abuse 1992  . History of tobacco abuse  1999  . Bilateral carpal tunnel syndrome   . Acne   . Hyperlipidemia   . Asthma    Current Outpatient Prescriptions  Medication Sig Dispense Refill  . acetaminophen (TYLENOL) 500 MG tablet Take 1 tablet (500 mg total) by mouth every 6 (six) hours as needed. (Patient taking differently: Take 1,000 mg by mouth every 6 (six) hours as needed. ) 30 tablet 0  . albuterol (PROVENTIL,VENTOLIN) 90 MCG/ACT inhaler Inhale 2 puffs into the lungs every 6 (six) hours as needed for Wheezing. 1 each 6  . amitriptyline (ELAVIL) 10 MG tablet 1-3 tabs q hs    . amLODipine (NORVASC) 10 MG tablet Take 1 tablet (10 mg total) by mouth daily. 30 tablet 11  . cyclobenzaprine (FLEXERIL) 5 MG tablet Take 1 tablet (5 mg total) by mouth 3 (three) times daily as needed for muscle spasms. 15 tablet 0  . ibuprofen (ADVIL,MOTRIN) 800 MG tablet Take 1,600 mg by mouth 2 (two) times daily.    Marland Kitchen loratadine (CLARITIN) 10 MG tablet Take 1 tablet (10 mg total) by mouth daily as needed. For allergies (Patient not taking: Reported on 06/11/2014) 30 tablet 3  . metroNIDAZOLE (FLAGYL) 500 MG tablet Take 1 tablet (500 mg total) by mouth 2 (two) times daily. (Patient not taking: Reported on 04/12/2014) 14 tablet 0  . mometasone (NASONEX) 50 MCG/ACT nasal spray Place 2 sprays into the nose daily. (Patient not taking: Reported on 06/11/2014) 17 g 12  . OVER THE COUNTER MEDICATION Inject 1 Syringe as directed once a week. hcg  injection    . pantoprazole (PROTONIX) 40 MG tablet Take 1 tablet (40 mg total) by mouth daily. 30 tablet 3  . predniSONE (DELTASONE) 20 MG tablet 2 tabs po daily x 4 days 8 tablet 0  . triamcinolone (KENALOG) 0.025 % ointment Apply 1 application topically 2 (two) times daily. To dry skin on back and torso (Patient not taking: Reported on 04/12/2014) 80 g 0  . VOLTAREN 1 % GEL Apply 1 application topically 3 (three) times daily.      No current facility-administered medications for this visit.   Family History  Problem  Relation Age of Onset  . Cancer Mother   . Diabetes Other    History   Social History  . Marital Status: Single    Spouse Name: N/A  . Number of Children: N/A  . Years of Education: N/A   Social History Main Topics  . Smoking status: Former Smoker    Types: Cigarettes  . Smokeless tobacco: Not on file     Comment: quit 24yrs ago  . Alcohol Use: No  . Drug Use: No  . Sexual Activity: Yes    Birth Control/ Protection: None   Other Topics Concern  . Not on file   Social History Narrative    The patient works at a daycare center, she completed high school, is single   Review of Systems: Review of Systems  Constitutional: Negative for fever and chills.  HENT: Positive for congestion.        Voice hoarseness. Mouth soreness.  Eyes: Negative for blurred vision.  Respiratory: Positive for cough (occasionally). Negative for sputum production, shortness of breath and wheezing.        Asthma  Cardiovascular: Positive for leg swelling (B/l LE). Negative for chest pain.  Gastrointestinal: Negative for nausea, vomiting, abdominal pain, diarrhea, constipation and blood in stool.  Genitourinary: Negative for dysuria, urgency, frequency and hematuria.  Musculoskeletal: Positive for joint pain (chronic b/l knee pain). Negative for falls.  Neurological: Negative for dizziness and headaches.  Endo/Heme/Allergies: Positive for environmental allergies.     Objective:  Physical Exam: Filed Vitals:   06/11/14 1358  BP: 143/79  Pulse: 98  Temp: 97.3 F (36.3 C)  TempSrc: Oral  Weight: 205 lb (92.987 kg)  SpO2: 100%    Physical Exam  Constitutional: She is oriented to person, place, and time. She appears well-developed and well-nourished. No distress.  HENT:  Head: Normocephalic and atraumatic.  Right Ear: External ear normal.  Left Ear: External ear normal.  Nose: Nose normal.  Mouth/Throat: Oropharynx is clear and moist. No oropharyngeal exudate.  Eyes: Conjunctivae and EOM  are normal. Pupils are equal, round, and reactive to light. Right eye exhibits no discharge. Left eye exhibits no discharge. No scleral icterus.  Neck: Normal range of motion. Neck supple.  Cardiovascular: Normal rate, regular rhythm and normal heart sounds.   Pulmonary/Chest: Effort normal and breath sounds normal. No respiratory distress. She has no wheezes. She has no rales.  Abdominal: Soft. Bowel sounds are normal. She exhibits no distension. There is no tenderness. There is no rebound and no guarding.  Musculoskeletal: Normal range of motion. She exhibits edema (trace to +1  b/l LE ). She exhibits no tenderness.  Neurological: She is alert and oriented to person, place, and time.  Skin: Skin is warm and dry. No rash noted. She is not diaphoretic. No erythema. No pallor.  Psychiatric: She has a normal mood and affect. Her behavior is normal. Judgment and  thought content normal.    Assessment & Plan:   Please see problem list for problem-based assessment and plan

## 2014-06-11 NOTE — Assessment & Plan Note (Signed)
Assessment: Pt with hyperlipidemia on last lipid panel on 02/27/14 not on statin therapy with 10-yr ASCVD risk of <7.5% with recommendations to not start statin therapy.  Plan:  -No indication for statin therapy at this time, continue dietary modification

## 2014-06-11 NOTE — Assessment & Plan Note (Signed)
Assessment: Pt with moderate to well-controlled hypertension compliant with one-class (CCB) anti-hypertensive therapy who presents with blood pressure of 143/79.  Plan:  -BP 143/79 near goal <140/90 -Continue amlodipine 10 mg daily  -Start furosemide 20 mg PRN LE edema -Last BMPon 02/28/14 with mild hypokalemia 3.4 in setting of albuterol use, pt declined repeat testing, will obtain CMP at next visit

## 2014-06-11 NOTE — Assessment & Plan Note (Signed)
Assessment: Pt with seasonal allergic rhinitis partially compliant with medical therapy who presents with persistent symptoms.   Plan:  -Continue flonase nasal spray 2 sprays in each nostril daily and claritin 10 mg daily (insurance does not cover zyrtec)

## 2014-06-11 NOTE — Assessment & Plan Note (Signed)
Assessment: Pt with history of recurrent bacterial vaginosis unable to afford chronic suppressive therapy who presents with no active flare.    Plan:  -Pt instructed to use OTC rephresh vaginal cream for maintaining pH balance -Pt counseled on preventative measures  -PO flagyl PRN for flares  -Pt follows with Dr. Kerry Dory

## 2014-06-11 NOTE — Patient Instructions (Signed)
-  Start taking claritin and flonase everyday for allergies -Take albuterol inhaler with spacer as needed for shortness of breath and wheezing -Take lasix 20mg  only as needed for fluid in your legs -Will check your labs next time -Please come back in 6-8 months, nice seeing you today!   General Instructions:   Thank you for bringing your medicines today. This helps [place] keep you safe from mistakes.   Progress Toward Treatment Goals:  Treatment Goal 01/12/2013  Blood pressure at goal    Self Care Goals & Plans:  Self Care Goal 03/24/2014  Manage my medications take my medicines as prescribed; bring my medications to every visit; refill my medications on time  Monitor my health keep track of my blood pressure  Eat healthy foods eat more vegetables; eat foods that are low in salt  Be physically active find an activity I enjoy  Meeting treatment goals -    No flowsheet data found.   Care Management & Community Referrals:  Referral 01/12/2013  Referrals made for care management support none needed  Referrals made to community resources -

## 2014-06-11 NOTE — Progress Notes (Signed)
   CLINIC ENCOUNTER NOTE  History:  49 y.o. [id number] here today for follow up of recurrent bacterial vaginitis, last seen 10/23/14.  Had one other episode since last visit.  Could not afford Metrogel or boric acid capsules, has not been using these.  Was treated for last BV episode by PCP with oral Metronidazole.  Denies current abnormal vaginal discharge, vaginal bleeding, abdominal pain or other symptoms.  Past Medical History  Diagnosis Date  . High risk sexual behavior   . Hypertension   . Bacterial vaginitis     recurrent  . Recurrent UTI   . Genital herpes   . Recurrent boils   . External hemorrhoids with complication   . Allergic rhinitis   . Chronic constipation   . History of cocaine abuse 1992  . History of tobacco abuse 1999  . Bilateral carpal tunnel syndrome   . Acne   . Hyperlipidemia   . Asthma     Past Surgical History  Procedure Laterality Date  . Tummy tuck  03/2010    The following portions of the patient's history were reviewed and updated as appropriate: allergies, current medications, past family history, past medical history, past social history, past surgical history and problem list.   Health Maintenance:  Normal pap and negative HRHPV on 10/03/12. Normal mammogram on 04/14/14.   Review of Systems:  Comprehensive review of systems was otherwise negative.  Objective:  Physical Exam BP 133/77 mmHg  Pulse 92  Ht 5\' 2"  (1.575 m)  Wt 203 lb 1.6 oz (92.126 kg)  BMI 37.14 kg/m2  LMP 11/22/2012 CONSTITUTIONAL: Well-developed, well-nourished female in no acute distress.  HENT:  Normocephalic, atraumatic, External right and left ear normal. Oropharynx is clear and moist EYES: Conjunctivae and EOM are normal. Pupils are equal, round, and reactive to light. No scleral icterus.  NECK: Normal range of motion, supple, no masses SKIN: Skin is warm and dry. No rash noted. Not diaphoretic. No erythema. No pallor. [place]: Alert and oriented to person, place,  and time. Normal reflexes, muscle tone coordination. No cranial nerve deficit noted. PSYCHIATRIC: Normal mood and affect. Normal behavior. Normal judgment and thought content. CARDIOVASCULAR: Normal heart rate noted RESPIRATORY: Effort and breath sounds normal, no problems with respiration noted ABDOMEN: Soft, no distention noted.  PELVIC: Deferred MUSCULOSKELETAL: Normal range of motion. No edema and no tenderness.   Assessment & Plan:  Recurrent BV history, no current episode Recommended trial of OTC RepHresh to see if this will help maintain proper pH balance of the vagina and help prevent BV. Routine preventative health maintenance measures emphasized.   Verita Schneiders, MD, [place] Attending [place] for [employer], [place]

## 2014-06-13 NOTE — Progress Notes (Signed)
Internal Medicine Clinic Attending  Case discussed with Dr. Rabbani soon after the resident saw the patient.  We reviewed the resident's history and exam and pertinent patient test results.  I agree with the assessment, diagnosis, and plan of care documented in the resident's note.  

## 2014-07-05 ENCOUNTER — Telehealth: Payer: Self-pay | Admitting: *Deleted

## 2014-07-05 NOTE — Telephone Encounter (Signed)
Pt contacted the clinic and requested to speak with Dr. Harolyn Rutherford concerning discharge and smelly odor. Contacted patient and informed that Dr. Harolyn Rutherford was not in the clinic today will send her a message.  Message sent to Dr. Harolyn Rutherford.

## 2014-07-06 MED ORDER — METRONIDAZOLE 500 MG PO TABS: 500.0000 mg | ORAL_TABLET | Freq: Two times a day (BID) | ORAL | Status: DC

## 2014-07-06 NOTE — Telephone Encounter (Signed)
Pt came to the front desk and requested to speak with a nurse.  Speaking with pt, she stated that she a nurse was suppose to speak with Anyanwu concerning a Rx for BV.  Per protocol, I informed pt that I would send a Rx for flagyl for her BV.  Pt stated understanding with no further questions.

## 2014-07-08 ENCOUNTER — Telehealth: Payer: Self-pay | Admitting: *Deleted

## 2014-07-08 NOTE — Telephone Encounter (Signed)
Contacted patient concerning BV symptoms.  Pt states she has medication and will call if symptoms persist.

## 2014-07-20 ENCOUNTER — Other Ambulatory Visit: Payer: Self-pay | Admitting: Internal Medicine

## 2014-07-30 ENCOUNTER — Encounter: Payer: No Typology Code available for payment source | Admitting: Internal Medicine

## 2014-08-09 ENCOUNTER — Telehealth: Payer: Self-pay | Admitting: *Deleted

## 2014-08-09 NOTE — Telephone Encounter (Signed)
Received message left on nurse line on 08/09/14 at 1522 by patient.  States she is leaving a message for Dr. Loni Muse.  States Dr. Loni Muse told her to call her when she was having a fishy odor again.  Patient requests a return call.

## 2014-08-11 ENCOUNTER — Ambulatory Visit (INDEPENDENT_AMBULATORY_CARE_PROVIDER_SITE_OTHER): Payer: No Typology Code available for payment source | Admitting: Internal Medicine

## 2014-08-11 ENCOUNTER — Ambulatory Visit (INDEPENDENT_AMBULATORY_CARE_PROVIDER_SITE_OTHER): Payer: No Typology Code available for payment source | Admitting: Obstetrics & Gynecology

## 2014-08-11 ENCOUNTER — Encounter: Payer: Self-pay | Admitting: Internal Medicine

## 2014-08-11 ENCOUNTER — Encounter: Payer: Self-pay | Admitting: Obstetrics & Gynecology

## 2014-08-11 VITALS — BP 156/88 | HR 98 | Temp 98.2°F | Wt 198.4 lb

## 2014-08-11 VITALS — BP 134/75 | HR 94 | Ht 65.0 in | Wt 197.7 lb

## 2014-08-11 DIAGNOSIS — J302 Other seasonal allergic rhinitis: Secondary | ICD-10-CM | POA: Diagnosis not present

## 2014-08-11 DIAGNOSIS — Z01419 Encounter for gynecological examination (general) (routine) without abnormal findings: Secondary | ICD-10-CM | POA: Diagnosis not present

## 2014-08-11 DIAGNOSIS — J4530 Mild persistent asthma, uncomplicated: Secondary | ICD-10-CM

## 2014-08-11 DIAGNOSIS — N898 Other specified noninflammatory disorders of vagina: Secondary | ICD-10-CM

## 2014-08-11 DIAGNOSIS — Z1151 Encounter for screening for human papillomavirus (HPV): Secondary | ICD-10-CM | POA: Diagnosis not present

## 2014-08-11 DIAGNOSIS — I10 Essential (primary) hypertension: Secondary | ICD-10-CM

## 2014-08-11 DIAGNOSIS — IMO0002 Reserved for concepts with insufficient information to code with codable children: Secondary | ICD-10-CM

## 2014-08-11 DIAGNOSIS — Z7951 Long term (current) use of inhaled steroids: Secondary | ICD-10-CM | POA: Diagnosis not present

## 2014-08-11 DIAGNOSIS — Z124 Encounter for screening for malignant neoplasm of cervix: Secondary | ICD-10-CM | POA: Diagnosis not present

## 2014-08-11 DIAGNOSIS — Z118 Encounter for screening for other infectious and parasitic diseases: Secondary | ICD-10-CM | POA: Diagnosis not present

## 2014-08-11 DIAGNOSIS — J309 Allergic rhinitis, unspecified: Secondary | ICD-10-CM

## 2014-08-11 DIAGNOSIS — Z113 Encounter for screening for infections with a predominantly sexual mode of transmission: Secondary | ICD-10-CM | POA: Diagnosis not present

## 2014-08-11 LAB — COMPLETE METABOLIC PANEL WITH GFR
ALBUMIN: 4.6 g/dL (ref 3.6–5.1)
ALT: 16 U/L (ref 6–29)
AST: 19 U/L (ref 10–35)
Alkaline Phosphatase: 95 U/L (ref 33–115)
BUN: 12 mg/dL (ref 7–25)
CALCIUM: 9.7 mg/dL (ref 8.6–10.2)
CHLORIDE: 101 meq/L (ref 98–110)
CO2: 22 mEq/L (ref 20–31)
Creat: 0.92 mg/dL (ref 0.50–1.10)
GFR, Est African American: 85 mL/min (ref >=60)
GFR, Est Non African American: 73 mL/min (ref >=60)
Glucose, Bld: 124 mg/dL — ABNORMAL HIGH (ref 65–99)
POTASSIUM: 3.2 meq/L — AB (ref 3.5–5.3)
Sodium: 137 mEq/L (ref 135–146)
Total Bilirubin: 0.3 mg/dL (ref 0.2–1.2)
Total Protein: 7.6 g/dL (ref 6.1–8.1)

## 2014-08-11 MED ORDER — PREDNISONE 20 MG PO TABS: ORAL_TABLET | ORAL | Status: DC

## 2014-08-11 MED ORDER — CETIRIZINE HCL 10 MG PO TABS: 10.0000 mg | ORAL_TABLET | Freq: Every day | ORAL | Status: DC

## 2014-08-11 MED ORDER — METRONIDAZOLE 500 MG PO TABS: 500.0000 mg | ORAL_TABLET | Freq: Two times a day (BID) | ORAL | Status: DC

## 2014-08-11 MED ORDER — ALBUTEROL SULFATE HFA 108 (90 BASE) MCG/ACT IN AERS
2.0000 | INHALATION_SPRAY | Freq: Four times a day (QID) | RESPIRATORY_TRACT | Status: DC | PRN
Start: 2014-08-11 — End: 2016-01-19

## 2014-08-11 NOTE — Assessment & Plan Note (Addendum)
Assessment: Pt with moderate to well-controlled hypertension compliant with two-class (CCB & diuretic) anti-hypertensive therapy who presents with blood pressure of 156/88 in setting of asthma flare.  Plan:  -BP 156/88 not at goal <140/90 in setting of asthma flare  -Continue amlodipine 10 mg daily  -Continue furosemide 20 mg PRN LE edema -Consider adding HCTZ at next visit if uncontrolled  -Obtain CMP   ADDENDUM on 08/13/14: Pt with mild hypokalemia of 3.2 in setting of lasix usage. Start kdur 10 mEq daily.

## 2014-08-11 NOTE — Patient Instructions (Signed)
-  Start taking prednisone 40 mg daily for 5 days for your asthma -Take albuterol inhaler as needed -Take zyrtec instead of claritin for your allergies and keep taking flonase -Will check your bloodwork today   General Instructions:   Please bring your medicines with you each time you come to clinic.  Medicines may include prescription medications, over-the-counter medications, herbal remedies, eye drops, vitamins, or other pills.   Progress Toward Treatment Goals:  Treatment Goal 01/12/2013  Blood pressure at goal    Self Care Goals & Plans:  Self Care Goal 03/24/2014  Manage my medications take my medicines as prescribed; bring my medications to every visit; refill my medications on time  Monitor my health keep track of my blood pressure  Eat healthy foods eat more vegetables; eat foods that are low in salt  Be physically active find an activity I enjoy  Meeting treatment goals -    No flowsheet data found.   Care Management & Community Referrals:  Referral 01/12/2013  Referrals made for care management support none needed  Referrals made to community resources -
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
## 2014-08-11 NOTE — Telephone Encounter (Signed)
Called patient, no answer- left message stating we are trying to reach you to return your phone call, please call us back at the clinics 

## 2014-08-11 NOTE — Progress Notes (Signed)
   Subjective:    Patient ID: Carrie Franco, female    DOB: [birth year], 49 y.o.   MRN: [id number]  HPI 49 yo monogamous AA P3 here with the complaint of a smelly vagina after each sex occasion. She has used flagyl successfully in the past but does not like the cost of clinda cream.   Review of Systems Her pap and mammogram are due.    Objective:   Physical Exam  [id number] Abd- benign, s/p abdominoplasty EG, vagina normal Small amount of white discharge No masses on bimanual exam      Assessment & Plan:  Probable recurrent BV- I will check a wet prep and give her flagyl with refills Preventative care- pap today, schedule mammogram RTC prn

## 2014-08-11 NOTE — Telephone Encounter (Signed)
Patient came in today for appt

## 2014-08-11 NOTE — Assessment & Plan Note (Signed)
Assessment: Pt with seasonal allergic rhinitis compliant with medical therapy who presents with persistent symptoms.   Plan:  -Continue flonase nasal spray 2 sprays in each nostril daily -Change claritin 10 mg daily to zyrtec 10 mg daily

## 2014-08-11 NOTE — Assessment & Plan Note (Addendum)
Assessment: Pt with mild persistent asthma in setting of weather change, stress, and seasonal allergic rhinitis who presents with flare of 3 days duration.  Plan: -Prescribe prednisone 40 mg daily for 5 days  -No indication for antibiotics or chest xray at this time -Continue albuterol inhaler 2 puffs Q 6 hr PRN acute bronchospasm. Pt given albuterol inhaler sample (lot # [id number]).  -Continue flonase nasal spray and change claritin 10 mg daily to zyrtec 10 mg daily for allergic rhinitis

## 2014-08-12 LAB — WET PREP, GENITAL
Clue Cells Wet Prep HPF POC: NONE SEEN
TRICH WET PREP: NONE SEEN
YEAST WET PREP: NONE SEEN

## 2014-08-12 LAB — CYTOLOGY - PAP

## 2014-08-12 NOTE — Progress Notes (Signed)
Medicine attending: Medical history, presenting problems, physical findings, and medications, reviewed with Dr Marjan Rabbani and I concur with her evaluation and management plan. 

## 2014-08-13 MED ORDER — POTASSIUM CHLORIDE CRYS ER 10 MEQ PO TBCR: 10.0000 meq | EXTENDED_RELEASE_TABLET | Freq: Every day | ORAL | Status: DC

## 2014-08-13 NOTE — Addendum Note (Signed)
Addended byJuluis Mire on: 08/13/2014 10:17 AM   Modules accepted: Orders

## 2014-09-07 ENCOUNTER — Encounter: Payer: Self-pay | Admitting: Internal Medicine

## 2014-09-07 ENCOUNTER — Ambulatory Visit (INDEPENDENT_AMBULATORY_CARE_PROVIDER_SITE_OTHER): Payer: No Typology Code available for payment source | Admitting: Internal Medicine

## 2014-09-07 VITALS — BP 141/81 | HR 97 | Temp 98.0°F | Ht 65.0 in | Wt 202.7 lb

## 2014-09-07 DIAGNOSIS — K648 Other hemorrhoids: Secondary | ICD-10-CM | POA: Insufficient documentation

## 2014-09-07 DIAGNOSIS — D649 Anemia, unspecified: Secondary | ICD-10-CM | POA: Insufficient documentation

## 2014-09-07 DIAGNOSIS — I10 Essential (primary) hypertension: Secondary | ICD-10-CM | POA: Diagnosis not present

## 2014-09-07 DIAGNOSIS — Z860100 Personal history of colon polyps, unspecified: Secondary | ICD-10-CM

## 2014-09-07 DIAGNOSIS — Z0000 Encounter for general adult medical examination without abnormal findings: Secondary | ICD-10-CM

## 2014-09-07 DIAGNOSIS — K5730 Diverticulosis of large intestine without perforation or abscess without bleeding: Secondary | ICD-10-CM | POA: Insufficient documentation

## 2014-09-07 DIAGNOSIS — E876 Hypokalemia: Secondary | ICD-10-CM | POA: Diagnosis not present

## 2014-09-07 HISTORY — DX: Personal history of colon polyps, unspecified: Z86.0100

## 2014-09-07 MED ORDER — FUROSEMIDE 20 MG PO TABS: 20.0000 mg | ORAL_TABLET | ORAL | Status: DC | PRN

## 2014-09-07 NOTE — Assessment & Plan Note (Signed)
Assessment: Pt with hypokalemia in setting of diuretic use with no GI losses who presents with LE muscle cramping.   Plan: -Obtain BMP and Mg level  -Adjust kdur 10 mEq daily as necessary based on potassium level

## 2014-09-07 NOTE — Assessment & Plan Note (Addendum)
Assessment: Pt is postmenopausal with mild normocytic anemia with Hg of 11 on 02/28/14 with last colonoscopy in 2015 with diverticulosis, internal hemorrhoids, and multiple colonic polyps who presents with no active bleeding or hemodynamic instability.   Plan:  -Obtain CBC with no diff  -If continues to be anemic, obtain anemia panel at next visit  -Pt due for repeat colonoscopy in 2020

## 2014-09-07 NOTE — Assessment & Plan Note (Signed)
Assessment: Pt with moderate to well-controlled hypertension compliant with two-class (CCB & diuretic) anti-hypertensive therapy who presents with blood pressure of 141/81.  Plan:  -BP 141/81 near goal <140/90   -Continue amlodipine 10 mg daily  -Refill furosemide 20 mg PRN LE edema -Obtain BMP

## 2014-09-07 NOTE — Progress Notes (Signed)
Patient ID: Carrie Franco, female   DOB: 1965-01-26, 49 y.o.   MRN: [id number]    Subjective:   Patient ID: Carrie Franco female   DOB: 12-06-1965 49 y.o.   MRN: [id number]  HPI: CarrieArlisa VIHANA Franco is a 49 y.o. with past medical history of hypertension, hyperlipidemia, mild persistent asthma, allergic rhinitis, recurrent BV, and GERD who presents with chief complaint of leg cramping.    She reports leg cramping mostly at nighttime that improves if she rubs her legs without improvement in movement or feeling of crawling sensation. Her potassium at last visit on 7/27 was low at 3.2 and was started on kdur 10 mEq daily which she reports compliance with. She reports taking lasix as needed for LE edema which is about every other day. She reports compliance with taking amlodipine for hypertension with no worsening LE swelling after starting amlodipine. She denies headache, blurry vision, or lightheadedness.  Her Hg was mildly low at 11 in February. She is postmenopausal and denies vaginal bleeding. She had colonoscopy on 03/05/13 that revealed multiple small diverticula in the colon, 8 sessile polyps that were removed, internal hemorrhoids, and melanosis in the colon. She is due for repeat testing in 2020. She denies hematuria, BRBPR, or melena. She is not on AP or AC therapy. She denies family history of bleeding disorder.   She declines influenza vaccination.    Past Medical History  Diagnosis Date  . High risk sexual behavior   . Hypertension   . Bacterial vaginitis     recurrent  . Recurrent UTI   . Genital herpes   . Recurrent boils   . External hemorrhoids with complication   . Allergic rhinitis   . Chronic constipation   . History of cocaine abuse 1992  . History of tobacco abuse 1999  . Bilateral carpal tunnel syndrome   . Acne   . Hyperlipidemia   . Asthma    Current Outpatient Prescriptions  Medication Sig Dispense Refill  . acetaminophen (TYLENOL) 500 MG tablet Take 1 tablet (500  mg total) by mouth every 6 (six) hours as needed. (Patient taking differently: Take 1,000 mg by mouth every 6 (six) hours as needed. ) 30 tablet 0  . albuterol (PROVENTIL HFA;VENTOLIN HFA) 108 (90 BASE) MCG/ACT inhaler Inhale 2 puffs into the lungs every 6 (six) hours as needed for wheezing or shortness of breath. 1 Inhaler 2  . amitriptyline (ELAVIL) 10 MG tablet 1-3 tabs q hs    . amLODipine (NORVASC) 10 MG tablet Take 1 tablet (10 mg total) by mouth daily. 30 tablet 11  . cetirizine (ZYRTEC) 10 MG tablet Take 1 tablet (10 mg total) by mouth daily. 30 tablet 5  . fluticasone (FLONASE) 50 MCG/ACT nasal spray Place 2 sprays into both nostrils daily. 16 g 2  . furosemide (LASIX) 20 MG tablet Take 1 tablet (20 mg total) by mouth as needed for fluid or edema. 30 tablet 1  . ibuprofen (ADVIL,MOTRIN) 800 MG tablet Take 1,600 mg by mouth 2 (two) times daily.    . metroNIDAZOLE (FLAGYL) 500 MG tablet Take 1 tablet (500 mg total) by mouth 2 (two) times daily. 14 tablet 3  . OVER THE COUNTER MEDICATION Inject 1 Syringe as directed once a week. hcg injection    . pantoprazole (PROTONIX) 40 MG tablet TAKE ONE TABLET BY MOUTH ONCE DAILY 30 tablet 5  . potassium chloride SA (K-DUR,KLOR-CON) 10 MEQ tablet Take 1 tablet (10 mEq total) by mouth daily. [place]  tablet 2  . predniSONE (DELTASONE) 20 MG tablet Take 2 po daily x 5 days 10 tablet 0  . VOLTAREN 1 % GEL Apply 1 application topically 3 (three) times daily.      No current facility-administered medications for this visit.   Family History  Problem Relation Age of Onset  . Cancer Mother   . Diabetes Other    Social History   Social History  . Marital Status: Single    Spouse Name: N/A  . Number of Children: N/A  . Years of Education: N/A   Social History Main Topics  . Smoking status: Former Smoker    Types: Cigarettes  . Smokeless tobacco: Not on file     Comment: quit 36yrs ago  . Alcohol Use: No  . Drug Use: No  . Sexual Activity: Yes     Birth Control/ Protection: None   Other Topics Concern  . Not on file   Social History Narrative    The patient works at a daycare center, she completed high school, is single   Review of Systems: Review of Systems  Eyes: Negative for blurred vision.  Respiratory: Negative for cough, shortness of breath and wheezing.   Cardiovascular: Positive for leg swelling (chronic b/l LE).  Gastrointestinal: Positive for constipation (chronic). Negative for nausea, vomiting, abdominal pain, diarrhea, blood in stool and melena.  Genitourinary: Negative for dysuria, urgency, frequency and hematuria.  Musculoskeletal: Positive for myalgias (b/l LE).  Neurological: Negative for dizziness and headaches.     Objective:  Physical Exam: Filed Vitals:   09/07/14 1415  BP: 141/81  Pulse: 97  Temp: 98 F (36.7 C)  TempSrc: Oral  Height: 5\' 5"  (1.651 m)  Weight: 202 lb 11.2 oz (91.944 kg)  SpO2: 99%    Physical Exam  Constitutional: She is oriented to person, place, and time. She appears well-developed and well-nourished. No distress.  HENT:  Head: Normocephalic and atraumatic.  Right Ear: External ear normal.  Left Ear: External ear normal.  Nose: Nose normal.  Mouth/Throat: Oropharynx is clear and moist. No oropharyngeal exudate.  Eyes: Conjunctivae and EOM are normal. Pupils are equal, round, and reactive to light. Right eye exhibits no discharge. Left eye exhibits no discharge. No scleral icterus.  Neck: Normal range of motion. Neck supple.  Cardiovascular: Normal rate, regular rhythm and normal heart sounds.   Pulmonary/Chest: Effort normal and breath sounds normal. No respiratory distress. She has no wheezes. She has no rales.  Abdominal: Soft. Bowel sounds are normal. She exhibits no distension. There is no tenderness. There is no rebound and no guarding.  Musculoskeletal: Normal range of motion. She exhibits edema (trace b/l LE). She exhibits no tenderness.  Neurological: She is alert  and oriented to person, place, and time.  Skin: Skin is warm and dry. No rash noted. She is not diaphoretic. No erythema. No pallor.  Psychiatric: She has a normal mood and affect. Her behavior is normal. Judgment and thought content normal.    Assessment & Plan:   Please see problem list for problem-based assessment and plan

## 2014-09-07 NOTE — Assessment & Plan Note (Addendum)
-  Pt declined annual influenza vaccination today on 09/07/14 -Obtained colonoscopy records from [facility] that was performed on 03/05/13

## 2014-09-07 NOTE — Patient Instructions (Signed)
-  Will check your bloodwork today and call you with the results tomm, we may need to increase your potassium -I refilled your lasix, take it only as needed for swelling in your legs -Will try to get the records from your last colonoscopy -Very nice seeing you again and glad you are doing better!!   General Instructions:   Thank you for bringing your medicines today. This helps [place] keep you safe from mistakes.   Progress Toward Treatment Goals:  Treatment Goal 01/12/2013  Blood pressure at goal    Self Care Goals & Plans:  Self Care Goal 09/07/2014  Manage my medications take my medicines as prescribed; bring my medications to every visit; refill my medications on time  Monitor my health -  Eat healthy foods drink diet soda or water instead of juice or soda; eat more vegetables; eat foods that are low in salt; eat baked foods instead of fried foods; eat fruit for snacks and desserts  Be physically active -  Meeting treatment goals -    No flowsheet data found.   Care Management & Community Referrals:  Referral 01/12/2013  Referrals made for care management support none needed  Referrals made to community resources -

## 2014-09-08 LAB — CBC
HEMATOCRIT: 39.9 % (ref 34.0–46.6)
HEMOGLOBIN: 12.3 g/dL (ref 11.1–15.9)
MCH: 26.5 pg — ABNORMAL LOW (ref 26.6–33.0)
MCHC: 30.8 g/dL — ABNORMAL LOW (ref 31.5–35.7)
MCV: 86 fL (ref 79–97)
Platelets: 355 10*3/uL (ref 150–379)
RBC: 4.65 x10E6/uL (ref 3.77–5.28)
RDW: 15.7 % — AB (ref 12.3–15.4)
WBC: 7.1 10*3/uL (ref 3.4–10.8)

## 2014-09-08 LAB — BASIC METABOLIC PANEL
BUN/Creatinine Ratio: 24 — ABNORMAL HIGH (ref 9–23)
BUN: 14 mg/dL (ref 6–24)
CO2: 21 mmol/L (ref 18–29)
CREATININE: 0.59 mg/dL (ref 0.57–1.00)
Calcium: 10.4 mg/dL — ABNORMAL HIGH (ref 8.7–10.2)
Chloride: 97 mmol/L (ref 97–108)
GFR calc Af Amer: 124 mL/min/{1.73_m2} (ref >59)
GFR calc non Af Amer: 107 mL/min/{1.73_m2} (ref >59)
GLUCOSE: 79 mg/dL (ref 65–99)
Potassium: 4 mmol/L (ref 3.5–5.2)
Sodium: 141 mmol/L (ref 134–144)

## 2014-09-08 LAB — MAGNESIUM: Magnesium: 2.3 mg/dL (ref 1.6–2.3)

## 2014-09-09 NOTE — Progress Notes (Signed)
Internal Medicine Clinic Attending  Case discussed with Dr. Rabbani soon after the resident saw the patient.  We reviewed the resident's history and exam and pertinent patient test results.  I agree with the assessment, diagnosis, and plan of care documented in the resident's note.  

## 2014-09-15 ENCOUNTER — Telehealth: Payer: Self-pay | Admitting: Internal Medicine

## 2014-09-15 NOTE — Telephone Encounter (Signed)
Patient has questions what medications she is supposed to take

## 2014-09-16 NOTE — Telephone Encounter (Signed)
No answer

## 2014-09-16 NOTE — Telephone Encounter (Signed)
Talked with pt confused about meds - furosemide take as needed for fluid or edema and to continue with amlodipine as directed for BP per Dr Naaman Plummer 09/07/14. Hilda Blades Jemia Fata RN 09/16/14 10:45AM

## 2014-09-27 ENCOUNTER — Ambulatory Visit: Payer: No Typology Code available for payment source | Admitting: Internal Medicine

## 2014-09-27 ENCOUNTER — Encounter: Payer: Self-pay | Admitting: Internal Medicine

## 2014-10-15 ENCOUNTER — Encounter: Payer: No Typology Code available for payment source | Admitting: Internal Medicine

## 2014-11-29 ENCOUNTER — Other Ambulatory Visit: Payer: Self-pay | Admitting: Internal Medicine

## 2014-12-02 ENCOUNTER — Ambulatory Visit (INDEPENDENT_AMBULATORY_CARE_PROVIDER_SITE_OTHER): Payer: No Typology Code available for payment source | Admitting: Internal Medicine

## 2014-12-02 ENCOUNTER — Encounter: Payer: Self-pay | Admitting: Internal Medicine

## 2014-12-02 VITALS — BP 131/88 | HR 96 | Temp 98.2°F | Wt 205.6 lb

## 2014-12-02 DIAGNOSIS — I10 Essential (primary) hypertension: Secondary | ICD-10-CM | POA: Diagnosis not present

## 2014-12-02 DIAGNOSIS — F439 Reaction to severe stress, unspecified: Secondary | ICD-10-CM | POA: Diagnosis not present

## 2014-12-02 DIAGNOSIS — F430 Acute stress reaction: Secondary | ICD-10-CM

## 2014-12-02 DIAGNOSIS — M722 Plantar fascial fibromatosis: Secondary | ICD-10-CM | POA: Diagnosis not present

## 2014-12-02 MED ORDER — POTASSIUM CHLORIDE CRYS ER 10 MEQ PO TBCR: EXTENDED_RELEASE_TABLET | ORAL | Status: DC

## 2014-12-03 DIAGNOSIS — M722 Plantar fascial fibromatosis: Secondary | ICD-10-CM

## 2014-12-03 DIAGNOSIS — F430 Acute stress reaction: Secondary | ICD-10-CM | POA: Insufficient documentation

## 2014-12-03 HISTORY — DX: Plantar fascial fibromatosis: M72.2

## 2014-12-03 NOTE — Assessment & Plan Note (Signed)
Recent identifiable stressor with associated symptoms of anxiety, fatigue, malaise, transient visual symptoms without any other symptoms suggesting underlying cardiovascular of thyroid issues. Most likely an acute stress reaction, with organic causes less likely at this time. Patient denies any depressive symptoms currently. Symptoms have improved from initial presentation 2 days ago. Less likely HTN-induced as BP is well controlled, pt is compliant with meds, and BP max was 155 which should not cause these symptoms. -Counseled patient on relaxation techniques -Continue to monitor clinically

## 2014-12-03 NOTE — Assessment & Plan Note (Signed)
BP Readings from Last 3 Encounters:  12/02/14 131/88  09/07/14 141/81  08/11/14 134/75    Lab Results  Component Value Date   NA 141 09/07/2014   K 4.0 09/07/2014   CREATININE 0.59 09/07/2014    Assessment: Blood pressure control:  controlled Progress toward BP goal:   at goal Comments: on amlodipine 10mg  QD, also furosemide 20mg  PRN for leg swelling (uses ~2 times per week)  Plan: Medications:  continue current medications Educational resources provided:   Self management tools provided:   Other plans:

## 2014-12-03 NOTE — Assessment & Plan Note (Signed)
A: Most likely plantar fasciitis of R foot. Pt is non-diabetic with intact sensation so neuropathy less likely. -Instructed patient on several stretches/exercises to do before getting out of bed in morning as well as throughout the day, demonstrated to her and provided diagrams -NSAIDs PRN  -Orthotics

## 2014-12-03 NOTE — Progress Notes (Signed)
   Patient ID: Carrie Franco female   DOB: 03/14/1965 49 y.o.   MRN: [id number]  Subjective:   HPI: [name] is a 49 y.o. with PMH of hypertension, hyperlipidemia, mild persistent asthma, allergic rhinitis, recurrent BV, and GERD who presents to [facility] today for evaluation of malaise and 'wooziness' x 2 days as well as R heel pain x 2 weeks.   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 She thought it might be her blood pressure but she was unable to check at that time. She eventually checked it at home where it was 155/80. She denies fevers, chills, night sweats, dizziness, vertigo, diaphoresis, chest pain, palpitations, shortness of breath, nausea, vomiting, changes in urination or bowel function, trauma, any recent medication changes, recent illness, sick contacts, substance use, or any other symptoms. However, she does say that she has been stressed over the past 2 days since she finally got the courage to visit her son and his baby momma (whom she doesn't approve of) which she hadn't seen in several months. The meeting was very anxiety-provoking for her as she misses her son and disapproves of the girl he is living with, and her symptoms started shortly thereafter the exchange. She is feeling somewhat better today, still fatigued but without any recurrence of visual symptoms.   She also notes sharp L heel pain that occurs with the first few steps after getting out of bed and radiates through the bottom of her foot in the morning for the past 2 weeks. 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 She bought some orthotics that have helped her feet a lot over this time, but still has persistent symptoms. She denies symptoms in her R foot. She is non-diabetic.  Please see  problem-based charting for status of medical issues pertinent to this visit.  Review of Systems: Pertinent items noted in HPI and remainder of comprehensive ROS otherwise negative.  Objective:  Physical Exam: Filed Vitals:   12/02/14 1546  BP: 131/88  Pulse: 96  Temp: 98.2 F (36.8 C)  TempSrc: Oral  Weight: 205 lb 9.6 oz (93.26 kg)  SpO2: 98%   Gen: Well-appearing, alert and oriented to person, place, and time HEENT: Oropharynx clear without erythema or exudate.  Neck: No cervical LAD, no thyromegaly or nodules, no JVD noted. CV: Normal rate, regular rhythm, no murmurs, rubs, or gallops Pulmonary: Normal effort, CTA bilaterally, no wheezing, rales, or rhonchi Abdominal: Soft, non-tender, non-distended, without rebound, guarding, or masses Extremities: Distal pulses 2+ in upper and lower extremities bilaterally, no erythema or edema. There is tenderness to palpation in the R heel that reproduces her symptoms. Skin: No atypical appearing moles. No rashes  Assessment & Plan:  Please see problem-based charting for assessment and plan.  Blane Ohara, MD Resident Physician, PGY-1 Department of Internal Medicine [facility]

## 2014-12-07 NOTE — Progress Notes (Signed)
I saw and evaluated the patient.  I personally confirmed the key portions of Dr. Audrea Muscat history and exam and reviewed pertinent patient test results.  The assessment, diagnosis, and plan were formulated together and I agree with the documentation in the resident's note.

## 2015-01-10 ENCOUNTER — Other Ambulatory Visit: Payer: Self-pay | Admitting: Internal Medicine

## 2015-01-31 ENCOUNTER — Other Ambulatory Visit: Payer: Self-pay | Admitting: Internal Medicine

## 2015-02-01 ENCOUNTER — Telehealth: Payer: Self-pay | Admitting: Internal Medicine

## 2015-02-01 NOTE — Telephone Encounter (Signed)
NEEDS FOR A NURSE TO CALL HER BACK

## 2015-03-04 ENCOUNTER — Other Ambulatory Visit: Payer: Self-pay | Admitting: Internal Medicine

## 2015-03-07 ENCOUNTER — Ambulatory Visit: Payer: No Typology Code available for payment source | Admitting: Internal Medicine

## 2015-03-07 ENCOUNTER — Telehealth: Payer: Self-pay | Admitting: Internal Medicine

## 2015-03-07 NOTE — Telephone Encounter (Signed)
APPT REMINDER CALL, LMTCB IF SHE NEEDS TO CANCEL °

## 2015-03-08 ENCOUNTER — Ambulatory Visit (INDEPENDENT_AMBULATORY_CARE_PROVIDER_SITE_OTHER): Payer: Self-pay | Admitting: Internal Medicine

## 2015-03-08 ENCOUNTER — Encounter: Payer: Self-pay | Admitting: Internal Medicine

## 2015-03-08 VITALS — BP 146/90 | HR 82 | Temp 98.6°F | Wt 204.7 lb

## 2015-03-08 DIAGNOSIS — M542 Cervicalgia: Secondary | ICD-10-CM

## 2015-03-08 DIAGNOSIS — G8929 Other chronic pain: Secondary | ICD-10-CM | POA: Insufficient documentation

## 2015-03-08 DIAGNOSIS — I10 Essential (primary) hypertension: Secondary | ICD-10-CM

## 2015-03-08 MED ORDER — HYDROCHLOROTHIAZIDE 12.5 MG PO CAPS: 12.5000 mg | ORAL_CAPSULE | Freq: Every day | ORAL | Status: DC

## 2015-03-08 MED ORDER — CYCLOBENZAPRINE HCL 10 MG PO TABS: 10.0000 mg | ORAL_TABLET | Freq: Two times a day (BID) | ORAL | Status: DC | PRN
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
## 2015-03-08 NOTE — Assessment & Plan Note (Signed)
Assessment: Pt with moderate to well-controlled hypertension compliant with one-class (CCB) anti-hypertensive therapy who presents with blood pressure of 146/90.  Plan:  -BP 146/90 not at goal <140/90 -Prescribe HCTZ 12.5 mg daily which will help with chronic LE edema   -Continue amlodipine 10 mg daily  -Continue kdur 10 mEq daily  -Obtain CMP

## 2015-03-08 NOTE — Patient Instructions (Signed)
-  Start taking HCTZ 12.5 mg daily for high blood pressure. Keep taking potassium 10 mEq daily. -Start taking flexeril 10 mg twice a day as needed for muscle spasms -Will check your bloodwork and call you with the results -Very nice seeing you, please come back in 2-3 months  General Instructions:   Please bring your medicines with you each time you come to clinic.  Medicines may include prescription medications, over-the-counter medications, herbal remedies, eye drops, vitamins, or other pills.   Progress Toward Treatment Goals:  Treatment Goal 01/12/2013  Blood pressure at goal    Self Care Goals & Plans:  Self Care Goal 09/07/2014  Manage my medications take my medicines as prescribed; bring my medications to every visit; refill my medications on time  Monitor my health -  Eat healthy foods drink diet soda or water instead of juice or soda; eat more vegetables; eat foods that are low in salt; eat baked foods instead of fried foods; eat fruit for snacks and desserts  Be physically active -    No flowsheet data found.   Care Management & Community Referrals:  Referral 01/12/2013  Referrals made for care management support none needed  Referrals made to community resources -

## 2015-03-09 LAB — CMP14 + ANION GAP
A/G RATIO: 1.6 (ref 1.1–2.5)
ALBUMIN: 4.8 g/dL (ref 3.5–5.5)
ALT: 16 IU/L (ref 0–32)
AST: 17 IU/L (ref 0–40)
Alkaline Phosphatase: 112 IU/L (ref 39–117)
Anion Gap: 18 mmol/L (ref 10.0–18.0)
BUN/Creatinine Ratio: 21 (ref 9–23)
BUN: 15 mg/dL (ref 6–24)
Bilirubin Total: 0.2 mg/dL (ref 0.0–1.2)
CO2: 22 mmol/L (ref 18–29)
CREATININE: 0.72 mg/dL (ref 0.57–1.00)
Calcium: 10.3 mg/dL — ABNORMAL HIGH (ref 8.7–10.2)
Chloride: 99 mmol/L (ref 96–106)
GFR, EST AFRICAN AMERICAN: 113 mL/min/{1.73_m2} (ref >59)
GFR, EST NON AFRICAN AMERICAN: 98 mL/min/{1.73_m2} (ref >59)
GLOBULIN, TOTAL: 3 g/dL (ref 1.5–4.5)
Glucose: 94 mg/dL (ref 65–99)
Potassium: 4.4 mmol/L (ref 3.5–5.2)
Sodium: 139 mmol/L (ref 134–144)
Total Protein: 7.8 g/dL (ref 6.0–8.5)

## 2015-03-10 NOTE — Progress Notes (Signed)
Internal Medicine Clinic Attending  Case discussed with Dr. Rabbani soon after the resident saw the patient.  We reviewed the resident's history and exam and pertinent patient test results.  I agree with the assessment, diagnosis, and plan of care documented in the resident's note.  

## 2015-03-25 ENCOUNTER — Other Ambulatory Visit: Payer: Self-pay | Admitting: Internal Medicine

## 2015-03-25 ENCOUNTER — Telehealth: Payer: Self-pay | Admitting: *Deleted

## 2015-03-25 DIAGNOSIS — L309 Dermatitis, unspecified: Secondary | ICD-10-CM

## 2015-03-25 MED ORDER — TRIAMCINOLONE ACETONIDE 0.025 % EX OINT: 1.0000 "application " | TOPICAL_OINTMENT | Freq: Two times a day (BID) | CUTANEOUS | Status: DC

## 2015-03-25 NOTE — Telephone Encounter (Signed)
Fax from [facility] - pt requesting a refill on Triamcinolone 0.025% ointment Apply ointment twice daily to dry skin on back and torso  Qty 80  Last filled by Dr Burnard Bunting 05/2013. Thanks

## 2015-03-25 NOTE — Telephone Encounter (Signed)
Yes just refilled it, thanks!  Dr. Naaman Plummer

## 2015-03-28 ENCOUNTER — Emergency Department (HOSPITAL_COMMUNITY)
Admission: EM | Admit: 2015-03-28 | Discharge: 2015-03-28 | Disposition: A | Discharge status: Home / Self Care | Payer: Self-pay

## 2015-03-28 ENCOUNTER — Encounter (HOSPITAL_COMMUNITY): Payer: Self-pay | Admitting: *Deleted

## 2015-03-28 ENCOUNTER — Emergency Department (HOSPITAL_COMMUNITY)
Admission: EM | Admit: 2015-03-28 | Discharge: 2015-03-28 | Disposition: A | Discharge status: Home / Self Care | Payer: BLUE CROSS/BLUE SHIELD | Source: Home / Self Care | Attending: Emergency Medicine | Admitting: Emergency Medicine

## 2015-03-28 ENCOUNTER — Telehealth: Payer: Self-pay | Admitting: *Deleted

## 2015-03-28 DIAGNOSIS — J45901 Unspecified asthma with (acute) exacerbation: Secondary | ICD-10-CM | POA: Diagnosis not present

## 2015-03-28 MED ORDER — PREDNISONE 10 MG PO TABS: ORAL_TABLET | ORAL | Status: DC

## 2015-03-28 MED ORDER — ALBUTEROL SULFATE (2.5 MG/3ML) 0.083% IN NEBU
INHALATION_SOLUTION | RESPIRATORY_TRACT | Status: AC
  Filled 2015-03-28: qty 6

## 2015-03-28 NOTE — ED Notes (Signed)
Patient called in main ED waiting area with no response by [facility], Phlb

## 2015-03-28 NOTE — ED Notes (Signed)
PEFR  400

## 2015-03-28 NOTE — ED Provider Notes (Signed)
CSN: [id number]     Arrival date & time 03/28/15  1308 History   First MD Initiated Contact with Patient 03/28/15 1314     Chief Complaint  Patient presents with  . Shortness of Breath   (Consider location/radiation/quality/duration/timing/severity/associated sxs/prior Treatment) HPI History obtained from patient:  Pt presents with the cc [id number] Duration of symptoms:3 days Treatment prior to arrival:albuterol inhaler, qvar Previous symptoms of this type before:yes about 1 year ago Pain score:2 back and chest from coughing Other symptoms include:cough   Past Medical History  Diagnosis Date  . High risk sexual behavior   . Hypertension   . Bacterial vaginitis     recurrent  . Recurrent UTI   . Genital herpes   . Recurrent boils   . External hemorrhoids with complication   . Allergic rhinitis   . Chronic constipation   . History of cocaine abuse 1992  . History of tobacco abuse 1999  . Bilateral carpal tunnel syndrome   . Acne   . Hyperlipidemia   . Asthma    Past Surgical History  Procedure Laterality Date  . Tummy tuck  03/2010   Family History  Problem Relation Age of Onset  . Cancer Mother   . Diabetes Other    Social History  Substance Use Topics  . Smoking status: Former Smoker    Types: Cigarettes  . Smokeless tobacco: None     Comment: quit 15yrs ago  . Alcohol Use: No   OB History    Gravida Para Term Preterm AB TAB SAB Ectopic Multiple Living   4 3   1  1   3      Review of Systems Cough, wheezing Allergies  Hydrocodone-acetaminophen; Orange fruit; Orange oil; Meloxicam; and Penicillins  Home Medications   Prior to Admission medications   Medication Sig Start Date End Date Taking? Authorizing Provider  acetaminophen (TYLENOL) 500 MG tablet Take 1 tablet (500 mg total) by mouth every 6 (six) hours as needed. Patient taking differently: Take 1,000 mg by mouth every 6 (six) hours as needed.  03/24/14   Blain Pais, MD  albuterol  (PROVENTIL HFA;VENTOLIN HFA) 108 (90 BASE) MCG/ACT inhaler Inhale 2 puffs into the lungs every 6 (six) hours as needed for wheezing or shortness of breath. 08/11/14   Juluis Mire, MD  amitriptyline (ELAVIL) 10 MG tablet 1-3 tabs q hs 10/29/13   Historical Provider, MD  amLODipine (NORVASC) 10 MG tablet Take 1 tablet (10 mg total) by mouth daily. 03/24/14   Blain Pais, MD  cetirizine (ZYRTEC) 10 MG tablet Take 1 tablet (10 mg total) by mouth daily. 08/11/14   Juluis Mire, MD  cyclobenzaprine (FLEXERIL) 10 MG tablet Take 1 tablet (10 mg total) by mouth 2 (two) times daily as needed for muscle spasms. 03/08/15 03/07/16  Juluis Mire, MD  fluticasone (FLONASE) 50 MCG/ACT nasal spray Place 2 sprays into both nostrils daily. 06/11/14   Juluis Mire, MD  hydrochlorothiazide (MICROZIDE) 12.5 MG capsule Take 1 capsule (12.5 mg total) by mouth daily. 03/08/15   Juluis Mire, MD  ibuprofen (ADVIL,MOTRIN) 800 MG tablet Take 1,600 mg by mouth 2 (two) times daily. 03/06/14   Historical Provider, MD  metroNIDAZOLE (FLAGYL) 500 MG tablet Take 1 tablet (500 mg total) by mouth 2 (two) times daily. 08/11/14   Emily Filbert, MD  OVER THE COUNTER MEDICATION Inject 1 Syringe as directed once a week. hcg injection    Historical Provider, MD  pantoprazole (PROTONIX) 40 MG tablet TAKE  ONE TABLET BY MOUTH ONCE DAILY 02/01/15   Juluis Mire, MD  potassium chloride (K-DUR,KLOR-CON) 10 MEQ tablet TAKE ONE TABLET BY MOUTH ONCE DAILY 03/05/15   Juluis Mire, MD  predniSONE (DELTASONE) 10 MG tablet Sig: 4 tables once a day for 3 days, 3 tablets once a day X3 days, 2 tablets a day for 3 days, 1 tablet a day for 3 days. 03/28/15   Konrad Felix, PA  triamcinolone (KENALOG) 0.025 % ointment Apply 1 application topically 2 (two) times daily. Apply ointment twice daily to dry skin on back and torso 03/25/15   Marjan Rabbani, MD  VOLTAREN 1 % GEL Apply 1 application topically 3 (three) times daily.  02/03/14   Historical  Provider, MD   Meds Ordered and Administered this Visit  Medications - No data to display  BP 152/100 mmHg  Pulse 108  Temp(Src) 98.2 F (36.8 C) (Oral)  Resp 22  SpO2 95%  LMP 11/22/2012 No data found.   Physical Exam NURSES NOTES AND VITAL SIGNS REVIEWED. CONSTITUTIONAL: Well developed, well nourished, no acute distress HEENT: normocephalic, atraumatic, right and left TM's are normal EYES: Conjunctiva normal NECK:normal ROM, supple, no adenopathy PULMONARY:No respiratory distress, normal effort, Lungs:wheezing throughout with some shortness of breath  ARDIOVASCULAR: RRR, no murmur ABDOMEN: soft, ND, NT, +'ve BS MUSCULOSKELETAL: Normal ROM of all extremities,  SKIN: warm and dry without rash PSYCHIATRIC: Mood and affect, behavior are normal  ED Course  Procedures (including critical care time)  Labs Review Labs Reviewed - No data to display  Imaging Review No results found.   Visual Acuity Review  Right Eye Distance:   Left Eye Distance:   Bilateral Distance:    Right Eye Near:   Left Eye Near:    Bilateral Near:        Symptoms improved after neb tx PEFR meter given to patient . Parameters for the ER given.  MDM   1. Asthma exacerbation     Patient is reassured that there are no issues that require transfer to higher level of care at this time or additional tests. Patient is advised to continue home symptomatic treatment. Patient is advised that if there are new or worsening symptoms to attend the emergency department, contact primary care provider, or return to UC. Instructions of care provided discharged home in stable condition. Return to work/school note provided.   THIS NOTE WAS GENERATED USING A VOICE RECOGNITION SOFTWARE PROGRAM. ALL REASONABLE EFFORTS  WERE MADE TO PROOFREAD THIS DOCUMENT FOR ACCURACY.  I have verbally reviewed the discharge instructions with the patient. A printed AVS was given to the patient.  All questions were answered  prior to discharge.      Konrad Felix, PA 03/28/15 2100

## 2015-03-28 NOTE — ED Notes (Addendum)
Pt  Reports    Shortness   Of  Breath    History       Of  Asthma        Symptoms    X   sev  Days  Pt is   Coughing  As   Well   Pt  Is  A  Non   Smoker

## 2015-03-28 NOTE — Telephone Encounter (Signed)
Agree. thanks

## 2015-03-28 NOTE — Discharge Instructions (Signed)
Asthma, Adult Asthma is a condition of the lungs in which the airways tighten and narrow. Asthma can make it hard to breathe. Asthma cannot be cured, but medicine and lifestyle changes can help control it. Asthma may be started (triggered) by:  Animal skin flakes (dander).  Dust.  Cockroaches.  Pollen.  Mold.  Smoke.  Cleaning products.  Hair sprays or aerosol sprays.  Paint fumes or strong smells.  Cold air, weather changes, and winds.  Crying or laughing hard.  Stress.  Certain medicines or drugs.  Foods, such as dried fruit, potato chips, and sparkling grape juice.  Infections or conditions (colds, flu).  Exercise.  Certain medical conditions or diseases.  Exercise or tiring activities. HOME CARE  1. Take medicine as told by your doctor. 2. Use a peak flow meter as told by your doctor. A peak flow meter is a tool that measures how well the lungs are working. 3. Record and keep track of the peak flow meter's readings. 4. Understand and use the asthma action plan. An asthma action plan is a written plan for taking care of your asthma and treating your attacks. 5. To help prevent asthma attacks: 1. Do not smoke. Stay away from secondhand smoke. 2. Change your heating and air conditioning filter often. 3. Limit your use of fireplaces and wood stoves. 4. Get rid of pests (such as roaches and mice) and their droppings. 5. Throw away plants if you see mold on them. 6. Clean your floors. Dust regularly. Use cleaning products that do not smell. 7. Have someone vacuum when you are not home. Use a vacuum cleaner with a HEPA filter if possible. 8. Replace carpet with wood, tile, or vinyl flooring. Carpet can trap animal skin flakes and dust. 9. Use allergy-proof pillows, mattress covers, and box spring covers. 10. Wash bed sheets and blankets every week in hot water and dry them in a dryer. 11. Use blankets that are made of polyester or cotton. 12. Clean bathrooms and  kitchens with bleach. If possible, have someone repaint the walls in these rooms with mold-resistant paint. Keep out of the rooms that are being cleaned and painted. 13. Wash hands often. GET HELP IF:  You have make a whistling sound when breaking (wheeze), have shortness of breath, or have a cough even if taking medicine to prevent attacks.  The colored mucus you cough up (sputum) is thicker than usual.  The colored mucus you cough up changes from clear or white to yellow, green, gray, or bloody.  You have problems from the medicine you are taking such as:  A rash.  Itching.  Swelling.  Trouble breathing.  You need reliever medicines more than 2-3 times a week.  Your peak flow measurement is still at 50-79% of your personal best after following the action plan for 1 hour.  You have a fever. GET HELP RIGHT AWAY IF:   You seem to be worse and are not responding to medicine during an asthma attack.  You are short of breath even at rest.  You get short of breath when doing very little activity.  You have trouble eating, drinking, or talking.  You have chest pain.  You have a fast heartbeat.  Your lips or fingernails start to turn blue.  You are light-headed, dizzy, or faint.  Your peak flow is less than 50% of your personal best.   This information is not intended to replace advice given to you by your health care provider. Make sure  you discuss any questions you have with your health care provider.   Document Released: 06/20/2007 Document Revised: 09/22/2014 Document Reviewed: 07/31/2012 Elsevier Interactive Patient Education 2016 [employer] Peak Flow Meter A peak flow meter is a device that helps you determine how well your asthma is being controlled and how well your lungs are working at any given point in time. For people with asthma, it is a simple but important tool to help with daily asthma management. Peak flow meters are available over-the-counter.  The  readings from the meter will help you and your health care provider:   Determine the severity of your asthma.   Evaluate the effectiveness of your current treatment.   Determine when to add or stop certain medicines.   Recognize an asthma attack before signs or symptoms appear.   Decide when to seek emergency care.  RISKS AND COMPLICATIONS When you are using the peak flow meter, breathing too quickly may cause dizziness. At an extreme, this could cause you to pass out. Take your time so you do not get dizzy or light-headed.  HOW TO FIND YOUR PERSONAL BEST Your "personal best" is the highest peak flow rate you can reach when you feel good and have no asthma symptoms. This can be used as your baseline against which you can compare your peak flow meter readings. Because everyone's asthma is different, your personal best will be unique to you.  Your health care provider will help you figure out your personal best. Typically, you will take readings once or twice a day for 2 weeks when you are not having symptoms. The highest reading during the trial period is your personal best. Because your lung function can change over time, your personal best should be remeasured each year.  HOW TO USE YOUR PEAK FLOW METER  6. Move the upper marker to the number that is your personal best.  7. Move the lower marker to the bottom of the numbered scale.  8. Connect the mouthpiece to the peak flow meter.  9. Stand up.  10. Take a deep breath. Make sure you fill your lungs completely. 11. Place your lips tightly around the mouthpiece. Blow as hard and as fast as you can with a single breath (forced exhalation), as if you are blowing out candles. If your lips are not placed tightly around the mouthpiece of the peak flow meter, you will get incorrect low readings. 12. At the end of your forced exhale, check to see what number the lower marker landed on. This is your peak flow rate. 13. Move the lower marker  back to the bottom of the numbered scale. 14. Repeat these steps 2 more times. Record the highest reading of the 3 tries in your asthma diary. Do not calculate the average for your 3 tries, just record the highest.  Always write down the results in your asthma diary. After using your peak flow meter, rest and breathe slowly and easily. Keep a record of your progress. Your health care provider can provide you with a simple table to help with this. For the most accurate readings, it is important to keep your peak flow meter clean. Follow the manufacturer's instructions on how to take care of your peak flow meter.  Your health care provider will give you instructions on when to do regular monitoring. You may also need to check your peak flow when:   Coughing, wheezing, or other asthma symptoms wake you up at night.   Your asthma  symptoms worsen during the day.   Your breathing is made worse because of a cold, flu, or other respiratory illness.  You need to use your quick-relief, "rescue medicine." It is best to check your peak flow rate before you use rescue medicine, and then 20-30 minutes afterward. This will help determine whether you need to repeat taking the rescue medicine.  HOW TO USE YOUR RESULTS You and your health care provider can use color-coded zones on the meter to see how your peak flow rate compares to your personal best. The color code for each zone reflects progressively more severe symptoms:  Green Zone = Stable   Peak flow rate between 80% and 100% of your personal best. Your asthma is under good control when your peak flow rate is in the green zone.   When you are in the green zone, you are not likely to be experiencing any asthma symptoms.   Only take your regular, preventive medicine. Your rescue medicine is not required.  Yellow Zone = Caution   Peak flow rate between 50% and 80% of your personal best. Your asthma is getting worse and could be improved.  You may  have begun coughing, wheezing, or feeling chest tightness. Sometimes peak flow rates dip down into the yellow zone before asthma symptoms appear.  Consider increasing or changing your asthma medicine. This may include using your rescue medicine. If you have an asthma action plan, follow each step listed for the yellow zone, including medicine changes. Red Zone = Danger   Peak flow rate below 50% of your personal best. This may mean you are having, or are at risk of having, a medical emergency.  Your coughing, wheezing, or shortness of breath may have become severe. Do not wait to use your rescue medicine. Stop whatever you are doing and use your rescue inhaler, nebulizer, or other medicines to open your airways.  If you have an asthma action plan, follow each step listed for the red zone, including medicine changes and when to seek emergency medical care.  If your flow readings fall too far below your personal best into the yellow or red zone, you will need to take action to prevent or minimize an asthma attack.  SEEK MEDICAL CARE IF: You are in the yellow zone. If you have an asthma action plan, follow all of the steps listed under the yellow zone section of the plan. Let your health care provider know that you are in the yellow zone. SEEK IMMEDIATE MEDICAL CARE IF:  You are in the red zone. If you have an asthma action plan, follow all of the steps listed under the red zone section of the plan while you are seeking immediate medical care.   This information is not intended to replace advice given to you by your health care provider. Make sure you discuss any questions you have with your health care provider.   Document Released: 10/29/2006 Document Revised: 01/22/2014 Document Reviewed: 06/19/2012 Elsevier Interactive Patient Education [employer].

## 2015-03-28 NOTE — ED Notes (Signed)
No answer

## 2015-03-28 NOTE — Telephone Encounter (Signed)
Pt presents to front desk requesting a "breathing treatment".  Complaining of increased shortness of breath since yesterday. No relief with Albuterol inhaler.  She also tried taking a family member's Qvar. Pt visibly distressed, no openings today in [facility]. Pt taken via w/c to ED for evaluation.Despina Hidden Cassady3/13/201710:55 AM

## 2015-03-29 ENCOUNTER — Telehealth: Payer: Self-pay | Admitting: Internal Medicine

## 2015-03-29 NOTE — Telephone Encounter (Signed)
APPT. REMINDER CALL, LMTCB °

## 2015-03-30 ENCOUNTER — Ambulatory Visit: Payer: Self-pay | Admitting: Internal Medicine

## 2015-03-31 ENCOUNTER — Ambulatory Visit (INDEPENDENT_AMBULATORY_CARE_PROVIDER_SITE_OTHER): Payer: BLUE CROSS/BLUE SHIELD | Admitting: Internal Medicine

## 2015-03-31 ENCOUNTER — Encounter: Payer: Self-pay | Admitting: Internal Medicine

## 2015-03-31 VITALS — BP 162/85 | HR 95 | Temp 98.4°F | Ht 65.0 in | Wt 205.2 lb

## 2015-03-31 DIAGNOSIS — Z8742 Personal history of other diseases of the female genital tract: Secondary | ICD-10-CM

## 2015-03-31 DIAGNOSIS — J4531 Mild persistent asthma with (acute) exacerbation: Secondary | ICD-10-CM

## 2015-03-31 DIAGNOSIS — I10 Essential (primary) hypertension: Secondary | ICD-10-CM | POA: Diagnosis not present

## 2015-03-31 DIAGNOSIS — IMO0002 Reserved for concepts with insufficient information to code with codable children: Secondary | ICD-10-CM

## 2015-03-31 DIAGNOSIS — Z8619 Personal history of other infectious and parasitic diseases: Secondary | ICD-10-CM

## 2015-03-31 DIAGNOSIS — L309 Dermatitis, unspecified: Secondary | ICD-10-CM | POA: Diagnosis not present

## 2015-03-31 MED ORDER — IPRATROPIUM BROMIDE 0.02 % IN SOLN
0.5000 mg | Freq: Once | RESPIRATORY_TRACT | Status: AC
  Administered 2015-03-31: 0.5 mg via RESPIRATORY_TRACT

## 2015-03-31 MED ORDER — ALBUTEROL SULFATE (2.5 MG/3ML) 0.083% IN NEBU
2.5000 mg | INHALATION_SOLUTION | Freq: Once | RESPIRATORY_TRACT | Status: AC
  Administered 2015-03-31: 2.5 mg via RESPIRATORY_TRACT

## 2015-03-31 MED ORDER — ALBUTEROL SULFATE (2.5 MG/3ML) 0.083% IN NEBU: 2.5000 mg | INHALATION_SOLUTION | Freq: Four times a day (QID) | RESPIRATORY_TRACT | Status: DC | PRN

## 2015-03-31 MED ORDER — IPRATROPIUM-ALBUTEROL 0.5-2.5 (3) MG/3ML IN SOLN: 3.0000 mL | Freq: Four times a day (QID) | RESPIRATORY_TRACT | Status: DC

## 2015-03-31 MED ORDER — ACYCLOVIR 200 MG PO CAPS: 200.0000 mg | ORAL_CAPSULE | Freq: Two times a day (BID) | ORAL | Status: DC

## 2015-03-31 MED ORDER — TRIAMCINOLONE ACETONIDE 0.025 % EX OINT: 1.0000 "application " | TOPICAL_OINTMENT | Freq: Two times a day (BID) | CUTANEOUS | Status: DC

## 2015-03-31 NOTE — Patient Instructions (Signed)
1. Finish Prednisone taper as prescribed. 2. Continue Albuterol inhaler with spacer and nebulizer every 4-6 hours as needed for chest tightness/wheezing. 3. Return to clinic if you have worsening shortness of breath or trouble breathing.  Asthma, Acute Bronchospasm Acute bronchospasm caused by asthma is also referred to as an asthma attack. Bronchospasm means your air passages become narrowed. The narrowing is caused by inflammation and tightening of the muscles in the air tubes (bronchi) in your lungs. This can make it hard to breathe or cause you to wheeze and cough. CAUSES Possible triggers are:  Animal dander from the skin, hair, or feathers of animals.  Dust mites contained in house dust.  Cockroaches.  Pollen from trees or grass.  Mold.  Cigarette or tobacco smoke.  Air pollutants such as dust, household cleaners, hair sprays, aerosol sprays, paint fumes, strong chemicals, or strong odors.  Cold air or weather changes. Cold air may trigger inflammation. Winds increase molds and pollens in the air.  Strong emotions such as crying or laughing hard.  Stress.  Certain medicines such as aspirin or beta-blockers.  Sulfites in foods and drinks, such as dried fruits and wine.  Infections or inflammatory conditions, such as a flu, cold, or inflammation of the nasal membranes (rhinitis).  Gastroesophageal reflux disease (GERD). GERD is a condition where stomach acid backs up into your esophagus.  Exercise or strenuous activity. SIGNS AND SYMPTOMS   Wheezing.  Excessive coughing, particularly at night.  Chest tightness.  Shortness of breath. DIAGNOSIS  Your health care provider will ask you about your medical history and perform a physical exam. A chest X-ray or blood testing may be performed to look for other causes of your symptoms or other conditions that may have triggered your asthma attack. TREATMENT  Treatment is aimed at reducing inflammation and opening up the  airways in your lungs. Most asthma attacks are treated with inhaled medicines. These include quick relief or rescue medicines (such as bronchodilators) and controller medicines (such as inhaled corticosteroids). These medicines are sometimes given through an inhaler or a nebulizer. Systemic steroid medicine taken by mouth or given through an IV tube also can be used to reduce the inflammation when an attack is moderate or severe. Antibiotic medicines are only used if a bacterial infection is present.  HOME CARE INSTRUCTIONS   Rest.  Drink plenty of liquids. This helps the mucus to remain thin and be easily coughed up. Only use caffeine in moderation and do not use alcohol until you have recovered from your illness.  Do not smoke. Avoid being exposed to secondhand smoke.  You play a critical role in keeping yourself in good health. Avoid exposure to things that cause you to wheeze or to have breathing problems.  Keep your medicines up-to-date and available. Carefully follow your health care provider's treatment plan.  Take your medicine exactly as prescribed.  When pollen or pollution is bad, keep windows closed and use an air conditioner or go to places with air conditioning.  Asthma requires careful medical care. See your health care provider for a follow-up as advised. If you are more than [redacted] weeks pregnant and you were prescribed any new medicines, let your obstetrician know about the visit and how you are doing. Follow up with your health care provider as directed.  After you have recovered from your asthma attack, make an appointment with your outpatient doctor to talk about ways to reduce the likelihood of future attacks. If you do not have a doctor  who manages your asthma, make an appointment with a primary care doctor to discuss your asthma. SEEK IMMEDIATE MEDICAL CARE IF:   You are getting worse.  You have trouble breathing. If severe, call your local emergency services (911 in the  [place]  You develop chest pain or discomfort.  You are vomiting.  You are not able to keep fluids down.  You are coughing up yellow, green, brown, or bloody sputum.  You have a fever and your symptoms suddenly get worse.  You have trouble swallowing. MAKE SURE YOU:   Understand these instructions.  Will watch your condition.  Will get help right away if you are not doing well or get worse.   This information is not intended to replace advice given to you by your health care provider. Make sure you discuss any questions you have with your health care provider.   Document Released: 04/18/2006 Document Revised: 01/06/2013 Document Reviewed: 07/09/2012 Elsevier Interactive Patient Education [employer].

## 2015-03-31 NOTE — Assessment & Plan Note (Signed)
A/P: Mild persistent asthma with acute exacerbation.  Patient on Prednisone taper and PRN albuterol.  No indication for Xray or antibiotics at that time.  I expect her symptoms to improve shortly with the Prednisone.  Patient counseled to return to clinic/ED if she does encounter worsening SOB or respiratory distress. - Continue Prednisone taper - Albuterol q4-6h PRN wheezing, SOB - Recommend starting ICS at follow up for management of mild persistent asthma - DME home nebulizer

## 2015-03-31 NOTE — Assessment & Plan Note (Signed)
BP Readings from Last 3 Encounters:  03/31/15 162/85  03/28/15 152/100  03/08/15 146/90    Lab Results  Component Value Date   NA 139 03/08/2015   K 4.4 03/08/2015   CREATININE 0.72 03/08/2015    Assessment: Blood pressure control:  fair Progress toward BP goal:    Comments: expectedly elevated in setting of acute asthma exacerbation and prednisone use  Plan: Medications:  continue current medications, Amlodipine and HCTZ Educational resources provided:   Self management tools provided:   Other plans: Recheck BP when not in acute illness

## 2015-03-31 NOTE — Progress Notes (Signed)
Patient ID: Carrie Franco, female   DOB: 1965-09-29, 50 y.o.   MRN: [id number]   Subjective:   Patient ID: Carrie Franco female   DOB: [birth year] 50 y.o.   MRN: [id number]  HPI: [name] is a 50 y.o. female with PMH as below, here for f/u UC visit for asthma.  Please see Problem-Based charting for the status of the patient's chronic medical issues.  Patient was seen in UC 3/13 due to asthma flare of 3 days.  She was given symptomatic therapy with albuterol PRN and prednisone taper. Her peak flow at UC was 400.  She is on day 3 of the taper.  She is using her Albuterol inhaler TID.   Her peak flow at home today was 350.  Her breathing has not improved since being seen, but it has not worsened.  She is currently able to accomplish her daily tasks, but will have to rest.  She endorses waking up at night SOB.  She denies fever, N/V, or constipation/diarrhea.  She has had a few chills 2/2 menopause.   Past Medical History  Diagnosis Date  . High risk sexual behavior   . Hypertension   . Bacterial vaginitis     recurrent  . Recurrent UTI   . Genital herpes   . Recurrent boils   . External hemorrhoids with complication   . Allergic rhinitis   . Chronic constipation   . History of cocaine abuse 1992  . History of tobacco abuse 1999  . Bilateral carpal tunnel syndrome   . Acne   . Hyperlipidemia   . Asthma    Current Outpatient Prescriptions  Medication Sig Dispense Refill  . acetaminophen (TYLENOL) 500 MG tablet Take 1 tablet (500 mg total) by mouth every 6 (six) hours as needed. (Patient taking differently: Take 1,000 mg by mouth every 6 (six) hours as needed. ) 30 tablet 0  . acyclovir (ZOVIRAX) 200 MG capsule Take 1 capsule (200 mg total) by mouth 2 (two) times daily. 30 capsule 0  . albuterol (PROVENTIL HFA;VENTOLIN HFA) 108 (90 BASE) MCG/ACT inhaler Inhale 2 puffs into the lungs every 6 (six) hours as needed for wheezing or shortness of breath. 1 Inhaler 2  . albuterol  (PROVENTIL) (2.5 MG/3ML) 0.083% nebulizer solution Take 3 mLs (2.5 mg total) by nebulization every 6 (six) hours as needed for wheezing or shortness of breath. 75 mL 12  . amitriptyline (ELAVIL) 10 MG tablet 1-3 tabs q hs    . amLODipine (NORVASC) 10 MG tablet Take 1 tablet (10 mg total) by mouth daily. 30 tablet 11  . cetirizine (ZYRTEC) 10 MG tablet Take 1 tablet (10 mg total) by mouth daily. 30 tablet 5  . cyclobenzaprine (FLEXERIL) 10 MG tablet Take 1 tablet (10 mg total) by mouth 2 (two) times daily as needed for muscle spasms. 30 tablet 1  . fluticasone (FLONASE) 50 MCG/ACT nasal spray Place 2 sprays into both nostrils daily. 16 g 2  . hydrochlorothiazide (MICROZIDE) 12.5 MG capsule Take 1 capsule (12.5 mg total) by mouth daily. 30 capsule 2  . ibuprofen (ADVIL,MOTRIN) 800 MG tablet Take 1,600 mg by mouth 2 (two) times daily.    . metroNIDAZOLE (FLAGYL) 500 MG tablet Take 1 tablet (500 mg total) by mouth 2 (two) times daily. 14 tablet 3  . OVER THE COUNTER MEDICATION Inject 1 Syringe as directed once a week. hcg injection    . pantoprazole (PROTONIX) 40 MG tablet TAKE ONE TABLET BY MOUTH ONCE  DAILY 30 tablet 5  . potassium chloride (K-DUR,KLOR-CON) 10 MEQ tablet TAKE ONE TABLET BY MOUTH ONCE DAILY 30 tablet 0  . predniSONE (DELTASONE) 10 MG tablet Sig: 4 tables once a day for 3 days, 3 tablets once a day X3 days, 2 tablets a day for 3 days, 1 tablet a day for 3 days. 30 tablet 0  . triamcinolone (KENALOG) 0.025 % ointment Apply 1 application topically 2 (two) times daily. Apply ointment twice daily to dry skin on back and torso 30 g 0  . VOLTAREN 1 % GEL Apply 1 application topically 3 (three) times daily.      Current Facility-Administered Medications  Medication Dose Route Frequency Provider Last Rate Last Dose  . ipratropium-albuterol (DUONEB) 0.5-2.5 (3) MG/3ML nebulizer solution 3 mL  3 mL Nebulization Q6H Iline Oven, MD       Family History  Problem Relation Age of Onset  .  Cancer Mother   . Diabetes Other    Social History   Social History  . Marital Status: Single    Spouse Name: N/A  . Number of Children: N/A  . Years of Education: N/A   Social History Main Topics  . Smoking status: Former Smoker    Types: Cigarettes  . Smokeless tobacco: None     Comment: quit 62yrs ago  . Alcohol Use: No  . Drug Use: No  . Sexual Activity: Yes    Birth Control/ Protection: None   Other Topics Concern  . None   Social History Narrative    The patient works at a daycare center, she completed high school, is single   Review of Systems: Pertinent items are noted in HPI. Objective:  Physical Exam: Filed Vitals:   03/31/15 1009  BP: 162/85  Pulse: 95  Temp: 98.4 F (36.9 C)  TempSrc: Oral  Height: 5\' 5"  (1.651 m)  Weight: 205 lb 3.2 oz (93.078 kg)  SpO2: 100%   Physical Exam  Constitutional: She is oriented to person, place, and time and well-developed, well-nourished, and in no distress. No distress.  HENT:  Head: Normocephalic and atraumatic.  Eyes: EOM are normal. No scleral icterus.  Neck: No tracheal deviation present.  Cardiovascular: Normal rate, regular rhythm and normal heart sounds.   Pulmonary/Chest: Effort normal. No stridor. No respiratory distress.  Minimal scattered wheezing, more pronounced on forced expiration. Breathing comfortably without use of accessory muscles.  Neurological: She is alert and oriented to person, place, and time.  Skin: Skin is warm and dry. She is not diaphoretic.     Assessment & Plan:   Patient and case were discussed with Dr. Dareen Piano.  Please refer to Problem Based charting for further documentation.

## 2015-03-31 NOTE — Addendum Note (Signed)
Addended by: Marcelino Duster on: 03/31/2015 11:35 AM   Modules accepted: Orders

## 2015-03-31 NOTE — Progress Notes (Signed)
Internal Medicine Clinic Attending  I saw and evaluated the patient.  I personally confirmed the key portions of the history and exam documented by Dr. Taylor and I reviewed pertinent patient test results.  The assessment, diagnosis, and plan were formulated together and I agree with the documentation in the resident's note.  

## 2015-04-01 DIAGNOSIS — L309 Dermatitis, unspecified: Secondary | ICD-10-CM | POA: Insufficient documentation

## 2015-04-01 NOTE — Assessment & Plan Note (Signed)
Patient requested refill of Triamcinolone. - Triamcinolone 0.1% ointment

## 2015-04-01 NOTE — Assessment & Plan Note (Signed)
Patient requested refill of Acyclovir. - Acyclovir refilled

## 2015-04-08 ENCOUNTER — Other Ambulatory Visit: Payer: Self-pay | Admitting: Internal Medicine

## 2015-04-13 ENCOUNTER — Encounter: Payer: Self-pay | Admitting: Internal Medicine

## 2015-04-13 ENCOUNTER — Ambulatory Visit (INDEPENDENT_AMBULATORY_CARE_PROVIDER_SITE_OTHER): Payer: BLUE CROSS/BLUE SHIELD | Admitting: Internal Medicine

## 2015-04-13 VITALS — BP 146/88 | HR 98 | Temp 98.9°F | Ht 65.0 in | Wt 206.7 lb

## 2015-04-13 DIAGNOSIS — Z7951 Long term (current) use of inhaled steroids: Secondary | ICD-10-CM | POA: Diagnosis not present

## 2015-04-13 DIAGNOSIS — J4530 Mild persistent asthma, uncomplicated: Secondary | ICD-10-CM

## 2015-04-13 DIAGNOSIS — J302 Other seasonal allergic rhinitis: Secondary | ICD-10-CM

## 2015-04-13 DIAGNOSIS — IMO0002 Reserved for concepts with insufficient information to code with codable children: Secondary | ICD-10-CM

## 2015-04-13 MED ORDER — FLUTICASONE-SALMETEROL 115-21 MCG/ACT IN AERO: 2.0000 | INHALATION_SPRAY | Freq: Two times a day (BID) | RESPIRATORY_TRACT | Status: DC

## 2015-04-13 NOTE — Assessment & Plan Note (Signed)
Patient has done well since last visit, though she still has hoarse voice, cough productive of green sputum, and chest tightness.  She is not having SOB.  She had improved with Prednisone, though she worsened since having stopped it.  She used her albuterol inhaler twice 2 days ago.  She wakes once nightly.   A/P: Mild-Moderate Persistent Asthma.  Symptoms uncontrolled with Albuterol alone, so we will escalate therapy with ICS/LABA.  Will start moderate dose Advair and reassess patient in 1 month.  Symptoms likely worsened by seasonal allergies, so will also restart patient's Claritin. - Albuterol PRN - Advair 115-21 HFA 2 puffs BID - Restart home Claritin - RTC 1 month

## 2015-04-13 NOTE — Patient Instructions (Addendum)
1. Start Advair 2 puffs twice daily, every day, regardless of symptoms. 2. Continue Albuterol inhaler as needed. 3. Restart your Claritin. 4. Return to clinic in 1 month.  Fluticasone; Salmeterol inhalation powder What is this medicine? FLUTICASONE; SALMETEROL (floo TIK a sone; sal ME te role) inhalation is a combination of two medicines that decrease inflammation and help to open up the airways of your lungs. It is used to treat COPD. This medicine is also used to treat asthma. Do NOT use for an acute asthma attack. Do NOT use for a COPD attack. This medicine may be used for other purposes; ask your health care provider or pharmacist if you have questions. What should I tell my health care provider before I take this medicine? They need to know if you have any of these conditions: -bone problems -immune system problems -diabetes -heart disease or irregular heartbeat -high blood pressure -infection -pheochromocytoma -seizures -thyroid disease -worsening asthma -an unusual or allergic reaction to fluticasone, salmeterol, other corticosteroids, other medicines, foods, dyes, or preservatives -pregnant or trying to get pregnant -breast-feeding How should I use this medicine? This medicine is inhaled through the mouth. Rinse your mouth with water after use. Make sure not to swallow the water. Follow the directions on the prescription label. Do not use a spacer device with this inhaler. Take your medicine at regular intervals. Do not take your medicine more often than directed. Do not stop taking except on your doctor's advice. Make sure that you are using your inhaler correctly. Ask you doctor or health care provider if you have any questions. A special MedGuide will be given to you by the pharmacist with each prescription and refill. Be sure to read this information carefully each time. Talk to your pediatrician regarding the use of this medicine in children. Special care may be  needed. Overdosage: If you think you have taken too much of this medicine contact a poison control center or emergency room at once. NOTE: This medicine is only for you. Do not share this medicine with others. What if I miss a dose? If you miss a dose, use it as soon as you remember. If it is almost time for your next dose, use only that dose and continue with your regular schedule, spacing doses evenly. Do not use double or extra doses. What may interact with this medicine? Do not take this medicine with any of the following medications: -MAOIs like Carbex, Eldepryl, Marplan, Nardil, and Parnate This medicine may also interact with the following medications: -aminophylline or theophylline -antiviral medicines for HIV or AIDS -diuretics -medicines for colds -medicines for depression or emotional conditions -medicines for fungal infections like ketoconazole and itraconazole -medicines for the heart like metoprolol, propanolol -medicines for weight loss including some herbal products -other medicine for breathing problems -pimozide -some antibiotics like clarithromycin, erythromycin, levofloxacin, linezolid, and telithromycin -vaccines This list may not describe all possible interactions. Give your health care provider a list of all the medicines, herbs, non-prescription drugs, or dietary supplements you use. Also tell them if you smoke, drink alcohol, or use illegal drugs. Some items may interact with your medicine. What should I watch for while using this medicine? Visit your doctor for regular check ups. Tell your doctor or health care professional if your symptoms do not get better. If your symptoms get worse or if you need your short-acting inhalers more often, call your doctor right away. Do not use this medicine more than every 12 hours. If you have asthma, be  aware that using this medicine may increase your risk of dying from asthma- related problems. Talk to your doctor about the risks  and benefits of taking this medicine. NEVER use this medicine for an acute asthma attack. If you are going to have surgery tell your doctor or health care professional that you are using this medicine. Try not to come in contact with people with the chicken pox or measles. If you do, call your doctor. What side effects may I notice from receiving this medicine? Side effects that you should report to your doctor or health care professional as soon as possible: -allergic reactions like skin rash or hives, swelling of the face, lips, or tongue -chest pain -dizziness or lightheaded -fever or chills -irregular heartbeat -vision problems Side effects that usually do not require medical attention (report to your doctor or health care professional if they continue or are bothersome): -coughing, hoarseness, throat irritation -headache -nervousness -stomach problems -stuffy nose -tremor This list may not describe all possible side effects. Call your doctor for medical advice about side effects. You may report side effects to FDA at 1-800-FDA-1088. Where should I keep my medicine? Keep out of the reach of children. Store at room temperature between 68 and 77 degrees F (20 and 25 degrees C). Do not leave your medicine in the heat or sun. Throw away 1 month after you open the package or whenever the dose indicator reads 0, whichever comes first. Throw away unopened packages after the expiration date. NOTE: This sheet is a summary. It may not cover all possible information. If you have questions about this medicine, talk to your doctor, pharmacist, or health care provider.    2016, Elsevier/Gold Standard. (2012-05-07 08:11:31)

## 2015-04-13 NOTE — Progress Notes (Signed)
Patient ID: Carrie Franco, female   DOB: Nov 19, 1965, 50 y.o.   MRN: [id number]   Subjective:   Patient ID: Carrie Franco female   DOB: Mar 31, 1965 50 y.o.   MRN: [id number]  HPI: Carrie Franco is a 50 y.o. female with PMH as below, here for asthma f/u.  Please see Problem-Based charting for the status of the patient's chronic medical issues.     Past Medical History  Diagnosis Date  . High risk sexual behavior   . Hypertension   . Bacterial vaginitis     recurrent  . Recurrent UTI   . Genital herpes   . Recurrent boils   . External hemorrhoids with complication   . Allergic rhinitis   . Chronic constipation   . History of cocaine abuse 1992  . History of tobacco abuse 1999  . Bilateral carpal tunnel syndrome   . Acne   . Hyperlipidemia   . Asthma    Current Outpatient Prescriptions  Medication Sig Dispense Refill  . acetaminophen (TYLENOL) 500 MG tablet Take 1 tablet (500 mg total) by mouth every 6 (six) hours as needed. (Patient taking differently: Take 1,000 mg by mouth every 6 (six) hours as needed. ) 30 tablet 0  . albuterol (PROVENTIL HFA;VENTOLIN HFA) 108 (90 BASE) MCG/ACT inhaler Inhale 2 puffs into the lungs every 6 (six) hours as needed for wheezing or shortness of breath. 1 Inhaler 2  . albuterol (PROVENTIL) (2.5 MG/3ML) 0.083% nebulizer solution Take 3 mLs (2.5 mg total) by nebulization every 6 (six) hours as needed for wheezing or shortness of breath. 75 mL 12  . amitriptyline (ELAVIL) 10 MG tablet 1-3 tabs q hs    . amLODipine (NORVASC) 10 MG tablet Take 1 tablet (10 mg total) by mouth daily. 30 tablet 11  . cetirizine (ZYRTEC) 10 MG tablet Take 1 tablet (10 mg total) by mouth daily. 30 tablet 5  . cyclobenzaprine (FLEXERIL) 10 MG tablet Take 1 tablet (10 mg total) by mouth 2 (two) times daily as needed for muscle spasms. 30 tablet 1  . fluticasone (FLONASE) 50 MCG/ACT nasal spray Place 2 sprays into both nostrils daily. 16 g 2  . fluticasone-salmeterol (ADVAIR  HFA) 115-21 MCG/ACT inhaler Inhale 2 puffs into the lungs 2 (two) times daily. 1 Inhaler 12  . hydrochlorothiazide (MICROZIDE) 12.5 MG capsule Take 1 capsule (12.5 mg total) by mouth daily. 30 capsule 2  . ibuprofen (ADVIL,MOTRIN) 800 MG tablet Take 1,600 mg by mouth 2 (two) times daily.    . metroNIDAZOLE (FLAGYL) 500 MG tablet Take 1 tablet (500 mg total) by mouth 2 (two) times daily. 14 tablet 3  . OVER THE COUNTER MEDICATION Inject 1 Syringe as directed once a week. hcg injection    . pantoprazole (PROTONIX) 40 MG tablet TAKE ONE TABLET BY MOUTH ONCE DAILY 30 tablet 5  . potassium chloride (K-DUR,KLOR-CON) 10 MEQ tablet TAKE ONE TABLET BY MOUTH ONCE DAILY 30 tablet 2  . predniSONE (DELTASONE) 10 MG tablet Sig: 4 tables once a day for 3 days, 3 tablets once a day X3 days, 2 tablets a day for 3 days, 1 tablet a day for 3 days. 30 tablet 0  . triamcinolone (KENALOG) 0.025 % ointment Apply 1 application topically 2 (two) times daily. Apply ointment twice daily to dry skin on back and torso 30 g 0  . VOLTAREN 1 % GEL Apply 1 application topically 3 (three) times daily.      No current facility-administered medications for  this visit.   Family History  Problem Relation Age of Onset  . Cancer Mother   . Diabetes Other    Social History   Social History  . Marital Status: Single    Spouse Name: N/A  . Number of Children: N/A  . Years of Education: N/A   Social History Main Topics  . Smoking status: Former Smoker    Types: Cigarettes  . Smokeless tobacco: None     Comment: quit 62yrs ago  . Alcohol Use: No  . Drug Use: No  . Sexual Activity: Yes    Birth Control/ Protection: None   Other Topics Concern  . None   Social History Narrative    The patient works at a daycare center, she completed high school, is single   Review of Systems: She endorses cehst tightness, hoarse voice, and productive cough with green sputum. She also has red eyes and nasal congestion.  She denies  shortness of breath. Objective:  Physical Exam: Filed Vitals:   04/13/15 0902  BP: 146/88  Pulse: 98  Temp: 98.9 F (37.2 C)  TempSrc: Oral  Height: 5\' 5"  (1.651 m)  Weight: 206 lb 11.2 oz (93.759 kg)  SpO2: 100%   Physical Exam  Constitutional: She is oriented to person, place, and time and well-developed, well-nourished, and in no distress. No distress.  HENT:  Head: Normocephalic and atraumatic.  Nares inflamed/injected.    Eyes: EOM are normal. No scleral icterus.  Sclera appear normal.  Neck: No tracheal deviation present.  Cardiovascular: Normal rate, regular rhythm and normal heart sounds.   Pulmonary/Chest: Effort normal and breath sounds normal. No stridor. No respiratory distress. She has no wheezes.  No wheezes.  Abdominal: Soft. She exhibits no distension. There is no tenderness. There is no rebound and no guarding.  Neurological: She is alert and oriented to person, place, and time.  Skin: Skin is warm and dry. She is not diaphoretic.     Assessment & Plan:   Patient and case were discussed with Dr. Lynnae January.  Please refer to Problem Based charting for further documentation.

## 2015-04-13 NOTE — Assessment & Plan Note (Signed)
Patient has been having red eyes, congestion, post nasal drip, and productive cough.  She has not been using her Claritin since before her asthma exacerbation.  A/P: Allergic rhinitis, likely contributing to asthma.  Will restart patient's home Claritin. - Restart Claritin

## 2015-04-14 NOTE — Progress Notes (Signed)
Internal Medicine Clinic Attending  Case discussed with Dr. Taylor at the time of the visit.  We reviewed the resident's history and exam and pertinent patient test results.  I agree with the assessment, diagnosis, and plan of care documented in the resident's note. 

## 2015-04-15 ENCOUNTER — Other Ambulatory Visit: Payer: Self-pay | Admitting: Internal Medicine

## 2015-04-15 NOTE — Telephone Encounter (Signed)
Last seen 3/29. CMP done 2/21. No f/u appt scheduled.

## 2015-06-06 ENCOUNTER — Other Ambulatory Visit: Payer: Self-pay | Admitting: Internal Medicine

## 2015-06-15 ENCOUNTER — Telehealth: Payer: Self-pay | Admitting: Internal Medicine

## 2015-06-15 NOTE — Telephone Encounter (Signed)
Pt needs appt w/ pcp 

## 2015-06-20 NOTE — Telephone Encounter (Signed)
Patient unable to see pcp.  Dr. Naaman Plummer has one clinic before she leaves and it is full.  Patient will get letter in the mail at the end of June letting her know who her new pcp and to call for an appt.

## 2015-07-12 ENCOUNTER — Encounter: Payer: Self-pay | Admitting: *Deleted

## 2015-07-12 ENCOUNTER — Other Ambulatory Visit: Payer: Self-pay | Admitting: Internal Medicine

## 2015-07-12 NOTE — Telephone Encounter (Signed)
Needs appt next 30 days. PCP or [facility]

## 2015-08-03 ENCOUNTER — Other Ambulatory Visit: Payer: Self-pay | Admitting: Internal Medicine

## 2015-08-03 DIAGNOSIS — Z1231 Encounter for screening mammogram for malignant neoplasm of breast: Secondary | ICD-10-CM

## 2015-08-09 ENCOUNTER — Ambulatory Visit (HOSPITAL_COMMUNITY): Payer: BLUE CROSS/BLUE SHIELD

## 2015-08-09 ENCOUNTER — Ambulatory Visit: Payer: BLUE CROSS/BLUE SHIELD

## 2015-08-09 ENCOUNTER — Other Ambulatory Visit (HOSPITAL_COMMUNITY): Payer: Self-pay | Admitting: Orthopaedic Surgery

## 2015-08-09 ENCOUNTER — Ambulatory Visit (HOSPITAL_COMMUNITY)
Admission: RE | Admit: 2015-08-09 | Discharge: 2015-08-09 | Disposition: A | Discharge status: Home / Self Care | Payer: BLUE CROSS/BLUE SHIELD | Source: Ambulatory Visit | Attending: Internal Medicine | Admitting: Internal Medicine

## 2015-08-09 DIAGNOSIS — M2540 Effusion, unspecified joint: Secondary | ICD-10-CM | POA: Diagnosis not present

## 2015-08-09 NOTE — Progress Notes (Signed)
*  PRELIMINARY RESULTS* Vascular Ultrasound Left lower extremity venous duplex has been completed.  Preliminary findings: No evidence of DVT or baker's cyst.    Called results to [place].     Landry Mellow, RDMS, RVT  08/09/2015, 4:27 PM

## 2015-08-10 ENCOUNTER — Ambulatory Visit (INDEPENDENT_AMBULATORY_CARE_PROVIDER_SITE_OTHER): Payer: BLUE CROSS/BLUE SHIELD | Admitting: Internal Medicine

## 2015-08-10 VITALS — BP 134/76 | HR 84 | Temp 99.7°F | Wt 205.9 lb

## 2015-08-10 DIAGNOSIS — E876 Hypokalemia: Secondary | ICD-10-CM

## 2015-08-10 DIAGNOSIS — M542 Cervicalgia: Secondary | ICD-10-CM

## 2015-08-10 DIAGNOSIS — K219 Gastro-esophageal reflux disease without esophagitis: Secondary | ICD-10-CM

## 2015-08-10 DIAGNOSIS — G8929 Other chronic pain: Secondary | ICD-10-CM

## 2015-08-10 MED ORDER — IBUPROFEN 400 MG PO TABS: 400.0000 mg | ORAL_TABLET | Freq: Three times a day (TID) | ORAL | 0 refills | Status: DC | PRN

## 2015-08-10 MED ORDER — POTASSIUM CHLORIDE ER 10 MEQ PO TBCR: 10.0000 meq | EXTENDED_RELEASE_TABLET | Freq: Every day | ORAL | 0 refills | Status: DC

## 2015-08-10 MED ORDER — CYCLOBENZAPRINE HCL 10 MG PO TABS: 10.0000 mg | ORAL_TABLET | Freq: Two times a day (BID) | ORAL | 0 refills | Status: DC | PRN

## 2015-08-10 MED ORDER — PANTOPRAZOLE SODIUM 40 MG PO TBEC: 40.0000 mg | DELAYED_RELEASE_TABLET | Freq: Every day | ORAL | 0 refills | Status: DC

## 2015-08-10 NOTE — Assessment & Plan Note (Addendum)
I have refilled her Protonix for 30 days.  Would possbily consider a trial off this medication in the future if she were to come off chronic NSAIDs.

## 2015-08-10 NOTE — Progress Notes (Signed)
   CC: here for medication refills and to meet her new PCP  HPI:  [name] is a 50 y.o. woman with PMHx as detailed below.  She is presenting to [facility] today for medication refills of her ibuprofen, flexeril, K-dur, protonix and to meet her new PCP, Dr. Romelle Starcher Molt.  She does not want to be seen today by a female provider, but unfortunately only males are in [facility] currently.  Please see A&P for problems addressed at today's visit.  Past Medical History:  Diagnosis Date  . Acne   . Allergic rhinitis   . Asthma   . Bacterial vaginitis    recurrent  . Bilateral carpal tunnel syndrome   . Chronic constipation   . External hemorrhoids with complication   . Genital herpes   . High risk sexual behavior   . History of cocaine abuse 1992  . History of tobacco abuse 1999  . Hyperlipidemia   . Hypertension   . Recurrent boils   . Recurrent UTI     Review of Systems:   Unable to be obtained.  Physical Exam:  Vitals:   08/10/15 1439  BP: 134/76  Pulse: 84  Temp: 99.7 F (37.6 C)  TempSrc: Oral  SpO2: 98%  Weight: 205 lb 14.4 oz (93.4 kg)     Unable to examine patient due to not wanting to be seen by female provider.  However, I did hand her AVS and paper prescriptions in which she was pleasant, well-appearing and in no distress.  Her EOM were intact and she ambulated down the [place] with normal gait.    Assessment & Plan:   See Encounters Tab for problem based charting.   Patient discussed with Dr. Lynnae January   GERD (gastroesophageal reflux disease) I have refilled her Protonix for 30 days.  Would possbily consider a trial off this medication in the future if she were to come off chronic NSAIDs.  Hypokalemia I have refilled her K-dur 10mg  daily for 30 days.  Her last metabolic panel was in February 2017 with K+ of 4.4.  I would consider rechecking at her PCP follow up next month to ensure stability as she continues on with diuretic use.  Chronic neck pain I have given her a  refill of her ibuprofen and flexeril for 30 days.  Kidney function is stable.  Her ibuprofen was prescribed at 1600 mg BID and I have tapered this down to 400mg  TID as needed.  Given her history of HTN, would be cautious in NSAIDs contributing to this and try to avoid overuse.  She is on PPI for GI protection and would likely continue this if she remains on chronic NSAID therapy.

## 2015-08-10 NOTE — Patient Instructions (Signed)
Thank you for coming to see me today. It was a pleasure. Today we talked about:   I have given you a 1 month prescription for your ibuprofen, flexeril, K-dur, and protonix.    Please follow-up with your primary doctro in 1 month  If you have any questions or concerns, please do not hesitate to call the office at [phone].  Take Care,   Jule Ser, DO

## 2015-08-10 NOTE — Assessment & Plan Note (Addendum)
I have given her a refill of her ibuprofen and flexeril for 30 days.  Kidney function is stable.  Her ibuprofen was prescribed at 1600 mg BID and I have tapered this down to 400mg  TID as needed.  Given her history of HTN, would be cautious in NSAIDs contributing to this and try to avoid overuse.  She is on PPI for GI protection and would likely continue this if she remains on chronic NSAID therapy.

## 2015-08-10 NOTE — Assessment & Plan Note (Signed)
I have refilled her K-dur 10mg  daily for 30 days.  Her last metabolic panel was in February 2017 with K+ of 4.4.  I would consider rechecking at her PCP follow up next month to ensure stability as she continues on with diuretic use.

## 2015-08-11 ENCOUNTER — Ambulatory Visit
Admission: RE | Admit: 2015-08-11 | Discharge: 2015-08-11 | Disposition: A | Discharge status: Home / Self Care | Payer: BLUE CROSS/BLUE SHIELD | Source: Ambulatory Visit | Attending: Internal Medicine | Admitting: Internal Medicine

## 2015-08-11 DIAGNOSIS — Z1231 Encounter for screening mammogram for malignant neoplasm of breast: Secondary | ICD-10-CM

## 2015-08-11 NOTE — Progress Notes (Signed)
Internal Medicine Clinic Attending  Case discussed with Dr. Wallace at the time of the visit.  We reviewed the resident's history and exam and pertinent patient test results.  I agree with the assessment, diagnosis, and plan of care documented in the resident's note.  

## 2015-09-05 ENCOUNTER — Telehealth: Payer: Self-pay | Admitting: *Deleted

## 2015-09-05 NOTE — Telephone Encounter (Signed)
Call from pt - states she has been taking 800 mg of Ibuprofen BID; noticed her rx has been changed to 400 mg. And she will have to take more pills. I read office note from 7/26 -explain to pt, her doctor wants to taper down to 1200 mg a day from 1600 mg. And to try this and let us know how this works. States ok.

## 2015-09-14 ENCOUNTER — Other Ambulatory Visit: Payer: Self-pay | Admitting: Internal Medicine

## 2015-09-14 ENCOUNTER — Encounter: Payer: Self-pay | Admitting: Internal Medicine

## 2015-09-14 DIAGNOSIS — E876 Hypokalemia: Secondary | ICD-10-CM

## 2015-09-14 DIAGNOSIS — K219 Gastro-esophageal reflux disease without esophagitis: Secondary | ICD-10-CM

## 2015-09-23 ENCOUNTER — Other Ambulatory Visit: Payer: Self-pay | Admitting: Internal Medicine

## 2015-09-23 NOTE — Telephone Encounter (Signed)
Refill given of HCTZ 12.5 mg with 2 refills.

## 2015-10-13 ENCOUNTER — Ambulatory Visit (INDEPENDENT_AMBULATORY_CARE_PROVIDER_SITE_OTHER): Payer: BLUE CROSS/BLUE SHIELD | Admitting: Internal Medicine

## 2015-10-13 VITALS — BP 138/75 | HR 90 | Temp 97.8°F | Ht 63.0 in | Wt 198.0 lb

## 2015-10-13 DIAGNOSIS — M25561 Pain in right knee: Secondary | ICD-10-CM | POA: Diagnosis not present

## 2015-10-13 DIAGNOSIS — M25562 Pain in left knee: Secondary | ICD-10-CM

## 2015-10-13 DIAGNOSIS — L608 Other nail disorders: Secondary | ICD-10-CM | POA: Diagnosis not present

## 2015-10-13 DIAGNOSIS — S90221A Contusion of right lesser toe(s) with damage to nail, initial encounter: Secondary | ICD-10-CM

## 2015-10-13 DIAGNOSIS — S90129A Contusion of unspecified lesser toe(s) without damage to nail, initial encounter: Secondary | ICD-10-CM | POA: Insufficient documentation

## 2015-10-13 DIAGNOSIS — N898 Other specified noninflammatory disorders of vagina: Secondary | ICD-10-CM | POA: Diagnosis not present

## 2015-10-13 DIAGNOSIS — N760 Acute vaginitis: Secondary | ICD-10-CM

## 2015-10-13 DIAGNOSIS — B9689 Other specified bacterial agents as the cause of diseases classified elsewhere: Secondary | ICD-10-CM

## 2015-10-13 DIAGNOSIS — I10 Essential (primary) hypertension: Secondary | ICD-10-CM

## 2015-10-13 MED ORDER — IBUPROFEN 800 MG PO TABS: 800.0000 mg | ORAL_TABLET | Freq: Three times a day (TID) | ORAL | 0 refills | Status: DC | PRN

## 2015-10-13 MED ORDER — METRONIDAZOLE 500 MG PO TABS: 500.0000 mg | ORAL_TABLET | Freq: Three times a day (TID) | ORAL | 0 refills | Status: AC

## 2015-10-13 NOTE — Assessment & Plan Note (Signed)
She came to the clinic with complaint of having some milky colored vaginal discharge with fishy order. She do complain of mild burning but denies any itching. Denies any fever or chills. She states that she is getting bacterial vaginitis, as she has got that before and the symptoms are similar to that one. She refused vaginal exam today.  -Flagyl 500 mg 3 times a day for 7 days.

## 2015-10-13 NOTE — Assessment & Plan Note (Signed)
BP Readings from Last 3 Encounters:  10/13/15 138/75  08/10/15 134/76  04/13/15 (!) 146/88   Her blood pressure was within the goal today. She is being compliant with her medicines.  Continue with the current management.

## 2015-10-13 NOTE — Assessment & Plan Note (Signed)
She also complaining of worsening in her knee pain, she states that it happened during this time of this year. She states that ibuprofen really help and saying if we can give her a prescription of 800 mg strength.  I provided her with a prescription of ibuprofen 800 mg. I advised her not to take more than 3 tablets in a day. She states that she normally takes 1 tablet a day and that really helped throughout the day.

## 2015-10-13 NOTE — Patient Instructions (Signed)
Thank you for visiting clinic today. I'm giving you a prescription for Flagyl 500 mg, 3 times a day, for 7 days. Please take all the pills provided. I am giving you a referral for foot doctor for your toenails.

## 2015-10-13 NOTE — Assessment & Plan Note (Signed)
She also complained of discoloration and thickening of 2 of her toenails on the right foot. She also complained of occasional pain in the middle toe of her right foot.  She do have nail thickening of her right to middle toes, unable to assess any discoloration due to nail paint.  She might be having some onychomycosis. Was interested to go see a foot doctor.  I provided her with a referral for podiatrist.

## 2015-10-13 NOTE — Progress Notes (Signed)
  CC: Fishy order vaginal discharge for last 2-3 days.  HPI:  [name] is a 50 y.o. with past medical history as listed below, came to the clinic with complaint of having some milky colored vaginal discharge with fishy order. She do complain of mild burning but denies any itching. Denies any fever or chills. She states that she is getting bacterial vaginitis, as she has got that before and the symptoms are similar to that one. She refused vaginal exam today. She also complained of discoloration and thickening of 2 of her toenails on the right foot. She also complained of occasional pain in the middle toe of her right foot. She also complaining of worsening in her knee pain, she states that it happened during this time of this year. She states that ibuprofen really help and saying if we can give her a prescription of 800 mg strength.  Past Medical History:  Diagnosis Date  . Acne   . Allergic rhinitis   . Asthma   . Bacterial vaginitis    recurrent  . Bilateral carpal tunnel syndrome   . Chronic constipation   . External hemorrhoids with complication   . Genital herpes   . High risk sexual behavior   . History of cocaine abuse 1992  . History of tobacco abuse 1999  . Hyperlipidemia   . Hypertension   . Recurrent boils   . Recurrent UTI     Review of Systems: Her rest of review of system was negative.  Physical Exam:  Vitals:   10/13/15 1445  BP: 138/75  Pulse: 90  Temp: 97.8 F (36.6 C)  TempSrc: Oral  SpO2: 96%  Weight: 198 lb (89.8 kg)  Height: 5\' 3"  (1.6 m)   Gen. well-built, well-nourished, in no acute distress. Lungs. Clear bilaterally CV. Regular rate and rhythm Abdomen. Soft, nontender, bowel sounds positive. Extremities. No edema, no cyanosis, nontender ,she is having thickening of her two right middle toes. Cannot assess the color as she was waiting nail polish. Pulses 2+ bilaterally.  Assessment & Plan:   See Encounters Tab for problem based  charting.  Patient seen with Dr. Lynnae January

## 2015-10-17 NOTE — Progress Notes (Signed)
Internal Medicine Clinic Attending  I saw and evaluated the patient.  I personally confirmed the key portions of the history and exam documented by Dr. Amin and I reviewed pertinent patient test results.  The assessment, diagnosis, and plan were formulated together and I agree with the documentation in the resident's note. 

## 2015-10-19 ENCOUNTER — Other Ambulatory Visit: Payer: Self-pay | Admitting: Internal Medicine

## 2015-10-19 DIAGNOSIS — K219 Gastro-esophageal reflux disease without esophagitis: Secondary | ICD-10-CM

## 2015-10-19 DIAGNOSIS — E876 Hypokalemia: Secondary | ICD-10-CM

## 2015-10-25 ENCOUNTER — Encounter: Payer: Self-pay | Admitting: *Deleted

## 2015-11-07 ENCOUNTER — Telehealth (INDEPENDENT_AMBULATORY_CARE_PROVIDER_SITE_OTHER): Payer: Self-pay | Admitting: Orthopaedic Surgery

## 2015-11-07 NOTE — Telephone Encounter (Signed)
Patient would like ibuprofen rx sent to [facility] on [address]. She would also like to speak to a nurse (she did not provide reason as to why).

## 2015-11-08 ENCOUNTER — Other Ambulatory Visit (INDEPENDENT_AMBULATORY_CARE_PROVIDER_SITE_OTHER): Payer: Self-pay | Admitting: Orthopedic Surgery

## 2015-11-08 MED ORDER — IBUPROFEN 800 MG PO TABS: 800.0000 mg | ORAL_TABLET | Freq: Three times a day (TID) | ORAL | 0 refills | Status: DC | PRN

## 2015-11-08 NOTE — Telephone Encounter (Signed)
DONE

## 2015-11-09 ENCOUNTER — Encounter: Payer: Self-pay | Admitting: Internal Medicine

## 2015-11-09 ENCOUNTER — Ambulatory Visit (INDEPENDENT_AMBULATORY_CARE_PROVIDER_SITE_OTHER): Payer: BLUE CROSS/BLUE SHIELD | Admitting: Internal Medicine

## 2015-11-09 VITALS — BP 135/83 | HR 78 | Temp 98.2°F | Wt 192.8 lb

## 2015-11-09 DIAGNOSIS — I10 Essential (primary) hypertension: Secondary | ICD-10-CM

## 2015-11-09 DIAGNOSIS — G500 Trigeminal neuralgia: Secondary | ICD-10-CM

## 2015-11-09 HISTORY — DX: Trigeminal neuralgia: G50.0

## 2015-11-09 MED ORDER — CARBAMAZEPINE ER 200 MG PO TB12: 400.0000 mg | ORAL_TABLET | Freq: Two times a day (BID) | ORAL | 0 refills | Status: DC

## 2015-11-09 NOTE — Progress Notes (Signed)
   CC: sharp pains in head  HPI:  [name] is a 50 y.o. woman with history of HTN, asthma, and dental implants who presents with intermittent, sharp pains in the right side of her head.  For the past several months, she has had intermittent right frontotemporal shooting pains that last for 5-10 seconds at a time.  They feel like an electric shock, and start at maximum intensity before quickly subsiding.  They most often happen when she is chewing, and have been increasing in frequency, now occurring 1-2x every day.  She has several maxillary dental implants for many years, but had more dental work ~2 months ago when a prosthesis fell off of one of her implants.  She is now wearing a plate over the implants.  She thinks the head pain preceded the new dental procedures.   Past Medical History:  Diagnosis Date  . Acne   . Allergic rhinitis   . Asthma   . Bacterial vaginitis    recurrent  . Bilateral carpal tunnel syndrome   . Chronic constipation   . External hemorrhoids with complication   . Genital herpes   . High risk sexual behavior   . History of cocaine abuse 1992  . History of tobacco abuse 1999  . Hyperlipidemia   . Hypertension   . Recurrent boils   . Recurrent UTI    Review of Systems:   Review of Systems  Constitutional: Negative for chills and fever.  HENT: Negative for ear pain, hearing loss and tinnitus.   Eyes: Negative for blurred vision, double vision and pain.  Neurological: Positive for headaches. Negative for dizziness, tingling, tremors, sensory change, focal weakness and loss of consciousness.   Physical Exam:  Vitals:   11/09/15 1442  BP: 135/83  Pulse: 78  Temp: 98.2 F (36.8 C)  TempSrc: Oral  SpO2: 100%  Weight: 192 lb 12.8 oz (87.5 kg)   Physical Exam  Constitutional: She appears well-developed and well-nourished. No distress.  HENT:  Maxilla edentulous with bilateral screws present.  No ulcers or lesions.  Gums non-tender. No  lesions on palette or tongue No tenderness to palpation over temporal arteries No TMJ crepitus Bilateral cerumen obscuring TMs  Musculoskeletal:  Normal jaw ROM No TMJ tenderness  Lymphadenopathy:    She has no cervical adenopathy.  Neurological:  Cranial nerves intact, including no trigeminal anesthesia or hyperalgesia Strength and sensation normal hroughout bilateral UEs and LEs    Assessment & Plan:   See Encounters Tab for problem based charting.  Patient seen with Dr. Beryle Beams

## 2015-11-09 NOTE — Assessment & Plan Note (Addendum)
Her episodic, transient, intense shooting pains in V1 distribution that are brought on by chewing are consistent with trigeminal neuralgia.  Other possibility include referred dental pain and TMJ dysfunction.  However, her lack of maxillary tenderness makes dental pain less likely, and she doesn't report popping or stiffness of jaw to suggest TMJ.  Additionally, she is already taking NSAIDs which would be appropriate pharmacologic treatment for TMJ pain. -carbamazepine 200 mg at night for 1 week, then 200 mg BID as tolerated -CBC today and next visit for carbamazepine monitoring -F/u with dentist and OMFS -RTC 1 month, consider further uptitration of carbamazepine

## 2015-11-09 NOTE — Patient Instructions (Addendum)
You were seen today for sharp, shooting pains on the right side of your head.  I am not completely sure what is causing this, but it could be a condition called trigeminal neuralgia.  This can cause nerve pains in the side of your head liek you describe.  I have prescribed you a new medicine, Tegretol (carbamazepine), for the pain.  Start by taking one pill at night for one week.  Some people feel sleepy, dizzy, or like they're thinking more slowly on this medicine, but often get used to it.  If you are feeling ok taking one pill per day, go up to taking one pill in the morning and one in the evening.  Please also make an appointment with your dentist.  The pain could be related to your dental procedures, or the pain in the joint of your jaw.  We will see you again in 1 month.    Trigeminal Neuralgia Trigeminal neuralgia is a nerve disorder that causes attacks of severe facial pain. The attacks last from a few seconds to several minutes. They can happen for days, weeks, or months and then go away for months or years. Trigeminal neuralgia is also called tic douloureux. CAUSES This condition is caused by damage to a nerve in the face that is called the trigeminal nerve. An attack can be triggered by:  Talking.  Chewing.  Putting on makeup.  Washing your face.  Shaving your face.  Brushing your teeth.  Touching your face. RISK FACTORS This condition is more likely to develop in:  Women.  People who are 4 years of age or older. SYMPTOMS The main symptom of this condition is pain in the jaw, lips, eyes, nose, scalp, forehead, and face. The pain may be intense, stabbing, electric, or shock-like. DIAGNOSIS This condition is diagnosed with a physical exam. A CT scan or MRI may be done to rule out other conditions that can cause facial pain. TREATMENT This condition may be treated with:  Avoiding the things that trigger your attacks.  Pain medicine.  Surgery. This may be done in  severe cases if other medical treatment does not provide relief. HOME CARE INSTRUCTIONS  Take over-the-counter and prescription medicines only as told by your health care provider.  If you wish to get pregnant, talk with your health care provider before you start trying to get pregnant.  Avoid the things that trigger your attacks. It may help to:  Chew on the unaffected side of your mouth.  Avoid touching your face.  Avoid blasts of hot or cold air. SEEK MEDICAL CARE IF:  Your pain medicine is not helping.  You develop new, unexplained symptoms, such as:  Double vision.  Facial weakness.  Changes in hearing or balance.  You become pregnant. SEEK IMMEDIATE MEDICAL CARE IF:  Your pain is unbearable, and your pain medicine does not help.   This information is not intended to replace advice given to you by your health care provider. Make sure you discuss any questions you have with your health care provider.   Document Released: 12/30/1999 Document Revised: 09/22/2014 Document Reviewed: 04/26/2014 Elsevier Interactive Patient Education [employer].

## 2015-11-09 NOTE — Assessment & Plan Note (Signed)
BP Readings from Last 3 Encounters:  11/09/15 135/83  10/13/15 138/75  08/10/15 134/76   Lab Results  Component Value Date   CREATININE 0.72 03/08/2015   Lab Results  Component Value Date   K 4.4 03/08/2015    Current medications: amlodipine 10 mg daily, HCTZ 12.5 mg daily Also KCl 10 mEq daily  Assessment BP goal: 140/90 BP control: well controlled  Plan Medications: continue current meds Other: BMP today

## 2015-11-10 ENCOUNTER — Encounter: Payer: Self-pay | Admitting: Internal Medicine

## 2015-11-10 LAB — CBC WITH DIFFERENTIAL/PLATELET
BASOS ABS: 0 10*3/uL (ref 0.0–0.2)
Basos: 0 %
EOS (ABSOLUTE): 0.1 10*3/uL (ref 0.0–0.4)
Eos: 2 %
Hematocrit: 36.5 % (ref 34.0–46.6)
Hemoglobin: 12.1 g/dL (ref 11.1–15.9)
IMMATURE GRANULOCYTES: 0 %
Immature Grans (Abs): 0 10*3/uL (ref 0.0–0.1)
LYMPHS: 36 %
Lymphocytes Absolute: 2.2 10*3/uL (ref 0.7–3.1)
MCH: 28.2 pg (ref 26.6–33.0)
MCHC: 33.2 g/dL (ref 31.5–35.7)
MCV: 85 fL (ref 79–97)
MONOS ABS: 0.4 10*3/uL (ref 0.1–0.9)
Monocytes: 6 %
NEUTROS PCT: 56 %
Neutrophils Absolute: 3.5 10*3/uL (ref 1.4–7.0)
PLATELETS: 293 10*3/uL (ref 150–379)
RBC: 4.29 x10E6/uL (ref 3.77–5.28)
RDW: 15.2 % (ref 12.3–15.4)
WBC: 6.2 10*3/uL (ref 3.4–10.8)

## 2015-11-10 LAB — BMP8+ANION GAP
ANION GAP: 20 mmol/L — AB (ref 10.0–18.0)
BUN/Creatinine Ratio: 18 (ref 9–23)
BUN: 15 mg/dL (ref 6–24)
CHLORIDE: 99 mmol/L (ref 96–106)
CO2: 24 mmol/L (ref 18–29)
Calcium: 10.2 mg/dL (ref 8.7–10.2)
Creatinine, Ser: 0.82 mg/dL (ref 0.57–1.00)
GFR calc Af Amer: 96 mL/min/{1.73_m2} (ref >59)
GFR, EST NON AFRICAN AMERICAN: 83 mL/min/{1.73_m2} (ref >59)
GLUCOSE: 89 mg/dL (ref 65–99)
POTASSIUM: 3.6 mmol/L (ref 3.5–5.2)
SODIUM: 143 mmol/L (ref 134–144)

## 2015-11-10 NOTE — Progress Notes (Signed)
Medicine attending: I personally interviewed and  examined this patient, including a complete neurologic exam, on the day of the patient visit and reviewed pertinent clinical ,laboratory, and radiographic data  with resident physician Dr. Minus Liberty and we discussed a management plan. Likely trigeminal neuralgia but symptoms have occurred since multiple dental extractions. She has no focal neuro deficits. Will will start an empiric trial of tegretol. She is advised to schedule a short interval follow up with her oral surgeon.

## 2015-11-22 ENCOUNTER — Other Ambulatory Visit: Payer: Self-pay | Admitting: Internal Medicine

## 2015-11-22 DIAGNOSIS — E876 Hypokalemia: Secondary | ICD-10-CM

## 2015-11-22 DIAGNOSIS — K219 Gastro-esophageal reflux disease without esophagitis: Secondary | ICD-10-CM

## 2015-11-24 NOTE — Addendum Note (Signed)
Addended by: Hulan Fray on: 11/24/2015 07:42 PM   Modules accepted: Orders

## 2015-12-05 ENCOUNTER — Ambulatory Visit (INDEPENDENT_AMBULATORY_CARE_PROVIDER_SITE_OTHER): Payer: BLUE CROSS/BLUE SHIELD

## 2015-12-05 ENCOUNTER — Ambulatory Visit (INDEPENDENT_AMBULATORY_CARE_PROVIDER_SITE_OTHER): Payer: BLUE CROSS/BLUE SHIELD | Admitting: Orthopedic Surgery

## 2015-12-05 ENCOUNTER — Ambulatory Visit (INDEPENDENT_AMBULATORY_CARE_PROVIDER_SITE_OTHER): Payer: Self-pay

## 2015-12-05 ENCOUNTER — Encounter (INDEPENDENT_AMBULATORY_CARE_PROVIDER_SITE_OTHER): Payer: Self-pay | Admitting: Orthopedic Surgery

## 2015-12-05 DIAGNOSIS — M1990 Unspecified osteoarthritis, unspecified site: Secondary | ICD-10-CM | POA: Insufficient documentation

## 2015-12-05 DIAGNOSIS — M1712 Unilateral primary osteoarthritis, left knee: Secondary | ICD-10-CM

## 2015-12-05 DIAGNOSIS — M25561 Pain in right knee: Secondary | ICD-10-CM | POA: Diagnosis not present

## 2015-12-05 DIAGNOSIS — M1711 Unilateral primary osteoarthritis, right knee: Secondary | ICD-10-CM | POA: Insufficient documentation

## 2015-12-05 HISTORY — DX: Unilateral primary osteoarthritis, right knee: M17.11

## 2015-12-05 MED ORDER — VOLTAREN 1 % TD GEL: 1.0000 "application " | Freq: Three times a day (TID) | TRANSDERMAL | 5 refills | Status: DC

## 2015-12-05 NOTE — Addendum Note (Signed)
Addended by: Marcene Duos on: 12/05/2015 05:28 PM   Modules accepted: Orders

## 2015-12-05 NOTE — Progress Notes (Signed)
Office Visit Note   Patient: Carrie Franco           Date of Birth: April 14, 1965           MRN: [id number] Visit Date: 12/05/2015 Requested by: Einar Gip, DO [address] [place], [place] [address] PCP: Einar Gip, DO  Subjective: Chief Complaint  Patient presents with  . Left Knee - Pain  . Right Knee - Pain    HPI Carrie Franco is a 50 year old patient with bilateral knee arthritis.  She's had multiple injections.  Those notes are reviewed.  She's had 2 injections in the left knee this year of cortisone.  She's also had one Toradol injection which did not help as well as Euflexxa injection which gave marginal relief in both knees.  She is describing left greater than right knee pain.  She is working at [employer]  That is difficult for her based on the standing requirements.  She is considering Social Security disability.              Review of Systems All systems reviewed are negative as they relate to the chief complaint within the history of present illness.  Patient denies  fevers or chills.    Assessment & Plan: Visit Diagnoses:  1. Acute pain of right knee   2. Unilateral primary osteoarthritis, left knee     Plan: Impression bilateral knee arthritis with fairly high-frequency of injections this year.  We need to hold off until next year to resume the cycle of injections.  For now I am going to renew her handicap placard refill her Voltaren gel and get her a hinge knee brace for the left  Follow-Up Instructions: No Follow-up on file.   Orders:  Orders Placed This Encounter  Procedures  . XR KNEE 3 VIEW RIGHT   No orders of the defined types were placed in this encounter.     Procedures: No procedures performed   Clinical Data: No additional findings.  Objective: Vital Signs: LMP 11/22/2012   Physical Exam  Constitutional: She appears well-developed.  HENT:  Head: Normocephalic.  Eyes: EOM are normal.  Neck: Normal range of motion.  Cardiovascular:  Normal rate.   Pulmonary/Chest: Effort normal.  Neurological: She is alert.  Skin: Skin is warm.  Psychiatric: She has a normal mood and affect.    Ortho Exam on examination the patient has pretty normal alignment mild effusion left knee no effusion right knee.  The range of motion with good quad strength.  Mild patellofemoral crepitus is present bilaterally.  No groin pain with internal/external rotation leg.  Palpable pedal pulses present.  Collaterals are stable.  No other masses lymph adenopathy or skin changes noted in bilateral knee regions.  Range of motion nearly full.  Specialty Comments:  No specialty comments available.  Imaging: Xr Knee 3 View Right  Result Date: 12/05/2015 AP lateral merchant view right knee ordered and obtained.  On the sunrise/merchant view there is no patella fracture.  Patella femoral joint space is symmetric and generally well maintained although spurring is present medially and laterally.  On the lateral view medial joint space is significantly narrowed.  Spurring and osteophyte formation is present.  On the AP view both medial and lateral joint spaces have osteophytes but the medial joint space is worse on the right and left knee.  Slight varus alignment is present    PMFS History: Patient Active Problem List   Diagnosis Date Noted  . Unilateral primary  osteoarthritis, left knee 12/05/2015  . Osteoarthritis of right knee 12/05/2015  . Trigeminal neuralgia of right side of face 11/09/2015  . Contusion of toe 10/13/2015  . Eczema 04/01/2015  . Chronic neck pain 03/08/2015  . Plantar fasciitis of right foot 12/03/2014  . Stress reaction 12/03/2014  . History of colonic polyps 09/07/2014  . Diverticulosis of colon 09/07/2014  . Internal hemorrhoids 09/07/2014  . Hypokalemia 09/07/2014  . Normocytic anemia 09/07/2014  . BV (bacterial vaginosis) 03/25/2014  . H/O arthroscopy of knee 10/29/2013  . Hyperlipidemia 07/31/2013  . Bilateral knee pain  06/09/2013  . GERD (gastroesophageal reflux disease) 03/19/2013  . Obesity, unspecified 08/05/2012  . Carpal tunnel syndrome on both sides 10/26/2011  . Persistent asthma 02/23/2010  . Preventative health care 02/23/2010  . Essential hypertension 01/13/2007  . History of herpes genitalis 01/02/2006  . Allergic rhinitis 01/02/2006   Past Medical History:  Diagnosis Date  . Acne   . Allergic rhinitis   . Asthma   . Bacterial vaginitis    recurrent  . Bilateral carpal tunnel syndrome   . Chronic constipation   . External hemorrhoids with complication   . Genital herpes   . High risk sexual behavior   . History of cocaine abuse 1992  . History of tobacco abuse 1999  . Hyperlipidemia   . Hypertension   . Recurrent boils   . Recurrent UTI     Family History  Problem Relation Age of Onset  . Diabetes Other   . Cancer Mother     Past Surgical History:  Procedure Laterality Date  . tummy tuck  03/2010   Social History   Occupational History  . Not on file.   Social History Main Topics  . Smoking status: Former Smoker    Types: Cigarettes  . Smokeless tobacco: Not on file     Comment: quit 63yrs ago  . Alcohol use No  . Drug use: No  . Sexual activity: Yes    Birth control/ protection: None

## 2015-12-07 ENCOUNTER — Ambulatory Visit (INDEPENDENT_AMBULATORY_CARE_PROVIDER_SITE_OTHER): Payer: Self-pay | Admitting: Orthopedic Surgery

## 2015-12-16 ENCOUNTER — Telehealth (INDEPENDENT_AMBULATORY_CARE_PROVIDER_SITE_OTHER): Payer: BLUE CROSS/BLUE SHIELD | Admitting: *Deleted

## 2015-12-16 DIAGNOSIS — M170 Bilateral primary osteoarthritis of knee: Secondary | ICD-10-CM | POA: Diagnosis not present

## 2015-12-16 NOTE — Telephone Encounter (Signed)
Patient signed ABN for Hinged knee brace.

## 2015-12-16 NOTE — Telephone Encounter (Signed)
Pt called stating she left her knee brace in [place] over thanksgiving and needs a new one. CB:[phone]

## 2015-12-16 NOTE — Telephone Encounter (Signed)
Patient walked in and asked about knee brace, per Dr Marlou Sa we can give her another, have ABN signed, I am not sure insurance will cover it or not.

## 2015-12-26 ENCOUNTER — Other Ambulatory Visit: Payer: Self-pay | Admitting: Internal Medicine

## 2015-12-26 DIAGNOSIS — K219 Gastro-esophageal reflux disease without esophagitis: Secondary | ICD-10-CM

## 2015-12-26 DIAGNOSIS — E876 Hypokalemia: Secondary | ICD-10-CM

## 2015-12-27 ENCOUNTER — Encounter: Payer: Self-pay | Admitting: Internal Medicine

## 2015-12-27 ENCOUNTER — Ambulatory Visit (INDEPENDENT_AMBULATORY_CARE_PROVIDER_SITE_OTHER): Payer: BLUE CROSS/BLUE SHIELD | Admitting: Internal Medicine

## 2015-12-27 VITALS — BP 124/77 | HR 79 | Temp 98.3°F | Ht 63.0 in | Wt 191.3 lb

## 2015-12-27 DIAGNOSIS — K219 Gastro-esophageal reflux disease without esophagitis: Secondary | ICD-10-CM | POA: Diagnosis not present

## 2015-12-27 DIAGNOSIS — I10 Essential (primary) hypertension: Secondary | ICD-10-CM | POA: Diagnosis not present

## 2015-12-27 DIAGNOSIS — B9689 Other specified bacterial agents as the cause of diseases classified elsewhere: Secondary | ICD-10-CM

## 2015-12-27 DIAGNOSIS — Z8742 Personal history of other diseases of the female genital tract: Secondary | ICD-10-CM

## 2015-12-27 DIAGNOSIS — Z87891 Personal history of nicotine dependence: Secondary | ICD-10-CM

## 2015-12-27 DIAGNOSIS — Z760 Encounter for issue of repeat prescription: Secondary | ICD-10-CM

## 2015-12-27 DIAGNOSIS — Z79899 Other long term (current) drug therapy: Secondary | ICD-10-CM

## 2015-12-27 DIAGNOSIS — J45909 Unspecified asthma, uncomplicated: Secondary | ICD-10-CM | POA: Diagnosis not present

## 2015-12-27 DIAGNOSIS — Z7951 Long term (current) use of inhaled steroids: Secondary | ICD-10-CM

## 2015-12-27 DIAGNOSIS — N760 Acute vaginitis: Secondary | ICD-10-CM

## 2015-12-27 DIAGNOSIS — G500 Trigeminal neuralgia: Secondary | ICD-10-CM

## 2015-12-27 NOTE — Patient Instructions (Signed)
It was an absolute pleasure meeting you today! I look forward to working with you over the next few years.   1. I'm glad you are feeling well today. I've sent in your refills that you have requested.  2. You are up to date on all of your health maintenance requirements as well.  3. Please call or make an appointment if you need anything! I'm happy to see you.  4. Please try Naproxen for your thumb pain.

## 2015-12-27 NOTE — Progress Notes (Signed)
   CC: medication refill, routine HTN f/u  HPI:  [name] is a 50 y.o. F with Mhx significant for HTN, GERD, OA, Asthma and suspected Trigeminal Neuralgia who presents today for follow up evaluation and medication refills. Pt denies any complaints at this time and reports she feels well. She notes she did not take Carbamazepine as recommended for suspected trigeminal neuralgia and her symptoms improved spontaneously.    Past Medical History:  Diagnosis Date  . Acne   . Allergic rhinitis   . Asthma   . Bacterial vaginitis    recurrent  . Bilateral carpal tunnel syndrome   . Chronic constipation   . External hemorrhoids with complication   . Genital herpes   . High risk sexual behavior   . History of cocaine abuse 1992  . History of tobacco abuse 1999  . Hyperlipidemia   . Hypertension   . Recurrent boils   . Recurrent UTI     Review of Systems:  Review of Systems  Constitutional: Negative for chills, fever and weight loss.  Eyes: Negative for blurred vision and double vision.  Respiratory: Negative for cough and shortness of breath.   Cardiovascular: Negative for chest pain and leg swelling.  Gastrointestinal: Negative for abdominal pain, nausea and vomiting.  Genitourinary: Negative for dysuria and frequency.   Physical Exam: Physical Exam  Constitutional: She is oriented to person, place, and time. She appears well-developed and well-nourished. No distress.  HENT:  Head: Normocephalic and atraumatic.  Eyes: Pupils are equal, round, and reactive to light.  Cardiovascular: Normal rate, regular rhythm and normal heart sounds.   Pulmonary/Chest: Effort normal and breath sounds normal. No respiratory distress.  Abdominal: Soft. Bowel sounds are normal. She exhibits no distension. There is no tenderness.  Neurological: She is alert and oriented to person, place, and time. She exhibits normal muscle tone.  Skin: Skin is warm and dry. She is not diaphoretic.    Psychiatric: She has a normal mood and affect. Her behavior is normal. Judgment and thought content normal.   Vitals:   12/27/15 1538  BP: 124/77  Pulse: 79  Temp: 98.3 F (36.8 C)  TempSrc: Oral  SpO2: 100%  Weight: 191 lb 4.8 oz (86.8 kg)  Height: 5\' 3"  (1.6 m)     Assessment & Plan:   See Encounters Tab for problem based charting.  Patient seen with Dr. Daryll Drown

## 2015-12-28 NOTE — Assessment & Plan Note (Signed)
Stable. Symptoms are well controlled on Protonix 40 mg daily. Denied any dysphagia, nausea, vomiting or abdominal pain. -Continue protonix 40 mg

## 2015-12-28 NOTE — Assessment & Plan Note (Signed)
Stable. Pt reports she is doing well since her last visit and has not required her PRN Albuterol. She endorses continuing to take Advair 2 puffs BID and Claritin. Reports she usually requires PRN Albuterol this time of year. Continue current management however encouraged to return if symptoms worsen and are not responsive to albuterol -Albuterol prn -Advair 2 puffs BID -Claritin -Flonase

## 2015-12-28 NOTE — Assessment & Plan Note (Signed)
Controlled today at 127/77 in office. Pulse 79. Pt reports compliance with her Amlodipine 10 mg and HCTZ 12.5 mg. She reports non-compliance with low-sodium diet and counselling was given regarding the importance of this for blood pressure control.  -Continue current management with Amlodipine 10 mg and HCTZ 12.5 mg. -Counseled on importance of monitoring sodium intake and exercise -Refills given -Instructed to check blood pressure occasionally.

## 2015-12-28 NOTE — Assessment & Plan Note (Signed)
Stable. Pt notes she believes she is allergic to sperm and develops BV after contact with it. Reports they use the "pull-out" method to try to avoid this complication. Pt encouraged to try barrier methods and was encouraged to contact clinic if symptoms return. Presently denies any fishy odor, vaginal discharge or itching.  -Flagyl if pt were to call with BV infection

## 2015-12-28 NOTE — Assessment & Plan Note (Signed)
Improving. Pt reports her intermittent intense shooting pains on her face have improved since her last visit despite not taking any Carbamazepine.  -Will hold off on starting Carbamazepine at this point as syx have improved spontaneously  -Encouraged patient to monitor what exacerbates her symptoms

## 2015-12-30 NOTE — Progress Notes (Signed)
Internal Medicine Clinic Attending  I saw and evaluated the patient.  I personally confirmed the key portions of the history and exam documented by Dr. Molt and I reviewed pertinent patient test results.  The assessment, diagnosis, and plan were formulated together and I agree with the documentation in the resident's note. 

## 2016-01-12 ENCOUNTER — Other Ambulatory Visit: Payer: Self-pay | Admitting: Internal Medicine

## 2016-01-12 DIAGNOSIS — L309 Dermatitis, unspecified: Secondary | ICD-10-CM

## 2016-01-19 ENCOUNTER — Ambulatory Visit (INDEPENDENT_AMBULATORY_CARE_PROVIDER_SITE_OTHER): Payer: BLUE CROSS/BLUE SHIELD | Admitting: Internal Medicine

## 2016-01-19 DIAGNOSIS — M65949 Unspecified synovitis and tenosynovitis, unspecified hand: Secondary | ICD-10-CM

## 2016-01-19 DIAGNOSIS — M65842 Other synovitis and tenosynovitis, left hand: Secondary | ICD-10-CM | POA: Diagnosis not present

## 2016-01-19 DIAGNOSIS — M659 Synovitis and tenosynovitis, unspecified: Secondary | ICD-10-CM | POA: Insufficient documentation

## 2016-01-19 HISTORY — DX: Synovitis and tenosynovitis, unspecified: M65.9

## 2016-01-19 HISTORY — DX: Unspecified synovitis and tenosynovitis, unspecified hand: M65.949

## 2016-01-19 MED ORDER — NAPROXEN 500 MG PO TABS: 500.0000 mg | ORAL_TABLET | Freq: Two times a day (BID) | ORAL | 0 refills | Status: DC

## 2016-01-19 NOTE — Progress Notes (Signed)
   CC: Left thumb pain  HPI:  [name] is a 51 y.o. woman with a history of asthma, bilateral carpal tunnel syndrome, who is here today for left thumb pain. This pain is located over the MCP joint and worsens with movement of her thumb. She feels a catching sensation in her thumb when extending it. She also worries there is a knot over the joint that feels firm to the touch. She chronically has episodic tingling and pain in her hands from carpal tunnel syndrome and wears a wrist splint sometimes if it becomes persistent. This has not been particularly bothering her lately.   See problem based assessment and plan below for additional details  Past Medical History:  Diagnosis Date  . Acne   . Allergic rhinitis   . Asthma   . Bacterial vaginitis    recurrent  . Bilateral carpal tunnel syndrome   . Chronic constipation   . External hemorrhoids with complication   . Genital herpes   . High risk sexual behavior   . History of cocaine abuse 1992  . History of tobacco abuse 1999  . Hyperlipidemia   . Hypertension   . Recurrent boils   . Recurrent UTI     Review of Systems:  Review of Systems  Constitutional: Negative for fever.  Respiratory: Negative for cough.   Gastrointestinal: Negative for diarrhea.  Musculoskeletal: Positive for joint pain.  Neurological: Negative for tremors, sensory change and focal weakness.  Endo/Heme/Allergies: Positive for environmental allergies.    Physical Exam: Physical Exam  Constitutional: She is oriented to person, place, and time and well-developed, well-nourished, and in no distress. No distress.  Cardiovascular: Normal rate and regular rhythm.   Pulmonary/Chest: Effort normal and breath sounds normal.  Musculoskeletal:  Tenderness to direct palpation over the left thumb MCP joint on the palmar side. ROM on extension is full but causes pain, adduction against resistance reproduces this pain. There is no active synovitis.    Neurological: She is alert and oriented to person, place, and time. Gait normal.  Skin: Skin is warm and dry. No rash noted.  Psychiatric: Mood, affect and judgment normal.    Vitals:   01/19/16 1432  BP: (!) 142/84  Pulse: 69  Temp: 98.1 F (36.7 C)  TempSrc: Oral  SpO2: 100%  Weight: 187 lb 12.8 oz (85.2 kg)  Height: 5\' 4"  (1.626 m)    Assessment & Plan:   See Encounters Tab for problem based charting.  Patient discussed with Dr. Dareen Piano

## 2016-01-19 NOTE — Patient Instructions (Signed)
It was a pleasure to see you today.  For your thumb I recommend wearing a thumb splint at night to keep this from staying in a flexed position. I recommend taking some NSAID (alieve twice daily, ibuprofen 4 times daily, or equivalent). You have a prescription for voltaren gel 1% that you can apply to this area up 3-4 times daily. This medicine is on your insurance formulary so if they report a high price make sure they correctly run the insurance as it should be well covered. Please call us back if you have trouble with this.  Return to clinic Monday or Tuesday next week if your symptoms are worse and you cannot return to work reasonably by that time.

## 2016-01-21 NOTE — Assessment & Plan Note (Signed)
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 There is no significant evidence of stenosis limiting function of the thumb besides pain. I discussed this with Carrie Franco and treatment options. She is very hesitant about steroid injection after a bad experience with a past wrist injection and wishes to try conservative therapy at this time.  P: -Recommend taking naproxen scheduled BID for 1-2 weeks -Topical voltaren gel  -Recommend thumb splint for night use to limit loss of flexibility -Instructed to call back to arrange treatment with injection if her symptoms are not improving enough for her to work reasonably well -Return to work note provided at the office today

## 2016-01-23 NOTE — Progress Notes (Signed)
Internal Medicine Clinic Attending  Case discussed with Dr. Rice at the time of the visit.  We reviewed the resident's history and exam and pertinent patient test results.  I agree with the assessment, diagnosis, and plan of care documented in the resident's note.  

## 2016-01-24 ENCOUNTER — Other Ambulatory Visit: Payer: Self-pay | Admitting: Internal Medicine

## 2016-01-24 DIAGNOSIS — E876 Hypokalemia: Secondary | ICD-10-CM

## 2016-01-24 DIAGNOSIS — J309 Allergic rhinitis, unspecified: Secondary | ICD-10-CM

## 2016-01-24 DIAGNOSIS — K219 Gastro-esophageal reflux disease without esophagitis: Secondary | ICD-10-CM

## 2016-01-24 DIAGNOSIS — IMO0002 Reserved for concepts with insufficient information to code with codable children: Secondary | ICD-10-CM

## 2016-02-25 ENCOUNTER — Other Ambulatory Visit: Payer: Self-pay | Admitting: Internal Medicine

## 2016-02-25 DIAGNOSIS — G8929 Other chronic pain: Secondary | ICD-10-CM

## 2016-02-25 DIAGNOSIS — M542 Cervicalgia: Principal | ICD-10-CM

## 2016-03-30 ENCOUNTER — Other Ambulatory Visit: Payer: Self-pay | Admitting: Internal Medicine

## 2016-04-25 ENCOUNTER — Other Ambulatory Visit: Payer: Self-pay | Admitting: Internal Medicine

## 2016-04-27 NOTE — Telephone Encounter (Signed)
Should be scheduled to see PCP for followup in June or July (per December note) Provided 3 month Rx

## 2016-04-27 NOTE — Telephone Encounter (Signed)
Message sent to front office to schedule an appt. 

## 2016-06-20 ENCOUNTER — Ambulatory Visit (HOSPITAL_COMMUNITY)
Admission: RE | Admit: 2016-06-20 | Discharge: 2016-06-20 | Disposition: A | Discharge status: Home / Self Care | Payer: BLUE CROSS/BLUE SHIELD | Source: Ambulatory Visit | Attending: Internal Medicine | Admitting: Internal Medicine

## 2016-06-20 ENCOUNTER — Ambulatory Visit (INDEPENDENT_AMBULATORY_CARE_PROVIDER_SITE_OTHER): Payer: BLUE CROSS/BLUE SHIELD | Admitting: Internal Medicine

## 2016-06-20 VITALS — BP 149/82 | HR 82 | Temp 98.1°F | Ht 64.0 in | Wt 195.2 lb

## 2016-06-20 DIAGNOSIS — M654 Radial styloid tenosynovitis [de Quervain]: Secondary | ICD-10-CM

## 2016-06-20 DIAGNOSIS — M19032 Primary osteoarthritis, left wrist: Secondary | ICD-10-CM | POA: Diagnosis not present

## 2016-06-20 NOTE — Patient Instructions (Signed)
It was a pleasure to see you today Carrie Franco.  Your wrist pain is due to inflammation of the tendon that extends your thumb. This can be due to multiple causes but most commonly from overuse.  I recommend using NSAIDs but also use topical voltaren gel for this pain. You will need to wear your wrist brace full time for several days to let this rest and start healing.  I will order an xray today and see if there is any bony problem that could explain the symptoms coming right back so consistently.  Please call the clinic if you run out of your medicines or have more trouble with this. I will let you know when the xray results are back if there is any change in treatment needed.

## 2016-06-20 NOTE — Progress Notes (Signed)
   CC: Left thumb pain  HPI:  [name] is a 51 y.o. woman here for a 2-3 week history of left thumb pain, previsouly seen at [facility] for steroid injection.   See problem based assessment and plan below for additional details  Past Medical History:  Diagnosis Date  . Acne   . Allergic rhinitis   . Asthma   . Bacterial vaginitis    recurrent  . Bilateral carpal tunnel syndrome   . Chronic constipation   . External hemorrhoids with complication   . Genital herpes   . High risk sexual behavior   . History of cocaine abuse 1992  . History of tobacco abuse 1999  . Hyperlipidemia   . Hypertension   . Recurrent boils   . Recurrent UTI     Review of Systems:  Review of Systems  Cardiovascular: Negative for leg swelling.  Musculoskeletal: Negative for joint pain.  Neurological: Negative for tingling and sensory change.    Physical Exam: Physical Exam  Constitutional: She is well-developed, well-nourished, and in no distress.  HENT:  Mouth/Throat: Oropharynx is clear and moist. No oropharyngeal exudate.  Cardiovascular: Normal rate and regular rhythm.   Musculoskeletal: She exhibits tenderness. She exhibits no edema.  Left wrist positive finkelstein test, no obvious overlying swelling or erythema, intact ROM  Skin: No rash noted.    Vitals:   06/20/16 1425  BP: (!) 149/82  Pulse: 82  Temp: 98.1 F (36.7 C)  TempSrc: Oral  SpO2: 100%  Weight: 195 lb 3.2 oz (88.5 kg)  Height: 5\' 4"  (1.626 m)    Assessment & Plan:   See Encounters Tab for problem based charting.  Patient discussed with Dr. Lynnae January

## 2016-06-22 NOTE — Assessment & Plan Note (Signed)
HPI: Left thumb pain is an ongoing problem for her. She saw the [facility] with [facility] in May for this problem and had a steroid injection to the dorsal first compartment for tenosynovitis. She was recommended to wear a brace continuously for 3 days at the time but has only worn this at night. Her work requires continual use of her hands which worsens the pain. She previously had surgery for relief of flexor tenosynovitis in the same thumb.   A: De Quervain's tenosynovitis. 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

## 2016-06-25 NOTE — Progress Notes (Signed)
Internal Medicine Clinic Attending  Case discussed with Dr. Rice at the time of the visit.  We reviewed the resident's history and exam and pertinent patient test results.  I agree with the assessment, diagnosis, and plan of care documented in the resident's note.  

## 2016-06-29 ENCOUNTER — Other Ambulatory Visit: Payer: Self-pay | Admitting: Internal Medicine

## 2016-07-03 NOTE — Telephone Encounter (Signed)
hydrochlorothiazide (MICROZIDE) 12.5 MG capsule, refill request @ [facility]

## 2016-07-30 ENCOUNTER — Other Ambulatory Visit: Payer: Self-pay | Admitting: Internal Medicine

## 2016-07-30 DIAGNOSIS — E876 Hypokalemia: Secondary | ICD-10-CM

## 2016-07-30 DIAGNOSIS — M542 Cervicalgia: Secondary | ICD-10-CM

## 2016-07-30 DIAGNOSIS — K219 Gastro-esophageal reflux disease without esophagitis: Secondary | ICD-10-CM

## 2016-07-30 DIAGNOSIS — G8929 Other chronic pain: Secondary | ICD-10-CM

## 2016-08-03 ENCOUNTER — Other Ambulatory Visit: Payer: Self-pay | Admitting: *Deleted

## 2016-08-04 MED ORDER — AMLODIPINE BESYLATE 10 MG PO TABS: 10.0000 mg | ORAL_TABLET | Freq: Every day | ORAL | 0 refills | Status: DC

## 2016-08-17 ENCOUNTER — Ambulatory Visit (INDEPENDENT_AMBULATORY_CARE_PROVIDER_SITE_OTHER): Payer: BLUE CROSS/BLUE SHIELD | Admitting: Internal Medicine

## 2016-08-17 ENCOUNTER — Encounter: Payer: Self-pay | Admitting: Internal Medicine

## 2016-08-17 VITALS — BP 130/76 | HR 82 | Temp 98.2°F | Ht 64.0 in | Wt 194.9 lb

## 2016-08-17 DIAGNOSIS — M7062 Trochanteric bursitis, left hip: Secondary | ICD-10-CM

## 2016-08-17 DIAGNOSIS — M722 Plantar fascial fibromatosis: Secondary | ICD-10-CM | POA: Diagnosis not present

## 2016-08-17 DIAGNOSIS — I10 Essential (primary) hypertension: Secondary | ICD-10-CM | POA: Diagnosis not present

## 2016-08-17 DIAGNOSIS — M659 Synovitis and tenosynovitis, unspecified: Secondary | ICD-10-CM

## 2016-08-17 DIAGNOSIS — Z87891 Personal history of nicotine dependence: Secondary | ICD-10-CM

## 2016-08-17 DIAGNOSIS — Z79899 Other long term (current) drug therapy: Secondary | ICD-10-CM | POA: Diagnosis not present

## 2016-08-17 HISTORY — DX: Trochanteric bursitis, left hip: M70.62

## 2016-08-17 MED ORDER — NAPROXEN 500 MG PO TABS: 500.0000 mg | ORAL_TABLET | Freq: Two times a day (BID) | ORAL | 0 refills | Status: DC

## 2016-08-17 NOTE — Assessment & Plan Note (Signed)
BP Readings from Last 3 Encounters:  08/17/16 130/76  06/20/16 (!) 149/82  01/19/16 (!) 142/84   Her blood pressure was within goal. She has not taken any morning meds yet.  Continue with the current management.

## 2016-08-17 NOTE — Patient Instructions (Addendum)
Thank you for visiting clinic today. You can use naproxen as needed for your pain, try while sleeping on your left side to help it heal, you can also try Voltaren gel and heating pads. Exercises regularly and avoid those activities which will aggravate your pain. For your foot, use well fitted comfortable shoes, try soaking your feet in warm salt water to see if that will help. Please contact the clinic if your symptoms fail to resolve or get worse.  Trochanteric Bursitis Trochanteric bursitis is a condition that causes hip pain. Trochanteric bursitis happens when fluid-filled sacs (bursae) in the hip get irritated. Normally these sacs absorb shock and help strong bands of tissue (tendons) in your hip glide smoothly over each other and over your hip bones. What are the causes? This condition results from increased friction between the hip bones and the tendons that go over them. This condition can happen if you:  Have weak hips.  Use your hip muscles too much (overuse).  Get hit in the hip.  What increases the risk? This condition is more likely to develop in:  Women.  Adults who are middle-aged or older.  People with arthritis or a spinal condition.  People with weak buttocks muscles (gluteal muscles).  People who have one leg that is shorter than the other.  People who participate in certain kinds of athletic activities, such as: ? Running sports, especially long-distance running. ? Contact sports, like football or martial arts. ? Sports in which falls may occur, like skiing.  What are the signs or symptoms? The main symptom of this condition is pain and tenderness over the point of your hip. The pain may be:  Sharp and intense.  Dull and achy.  Felt on the outside of your thigh.  It may increase when you:  Lie on your side.  Walk or run.  Go up on stairs.  Sit.  Stand up after sitting.  Stand for long periods of time.  How is this diagnosed? This  condition may be diagnosed based on:  Your symptoms.  Your medical history.  A physical exam.  Imaging tests, such as: ? X-rays to check your bones. ? An MRI or ultrasound to check your tendons and muscles.  During your physical exam, your health care provider will check the movement and strength of your hip. He or she may press on the point of your hip to check for pain. How is this treated? This condition may be treated by:  Resting.  Reducing your activity.  Avoiding activities that cause pain.  Using crutches, a cane, or a walker to decrease the strain on your hip.  Taking medicine to help with swelling.  Having medicine injected into the bursae to help with swelling.  Using ice, heat, and massage therapy for pain relief.  Physical therapy exercises for strength and flexibility.  Surgery (rare).  Follow these instructions at home: Activity  Rest.  Avoid activities that cause pain.  Return to your normal activities as told by your health care provider. Ask your health care provider what activities are safe for you. Managing pain, stiffness, and swelling  Take over-the-counter and prescription medicines only as told by your health care provider.  If directed, apply heat to the injured area as told by your health care provider. ? Place a towel between your skin and the heat source. ? Leave the heat on for 20-30 minutes. ? Remove the heat if your skin turns bright red. This is especially important if  you are unable to feel pain, heat, or cold. You may have a greater risk of getting burned.  If directed, apply ice to the injured area: ? Put ice in a plastic bag. ? Place a towel between your skin and the bag. ? Leave the ice on for 20 minutes, 2-3 times a day. General instructions  If the affected leg is one that you use for driving, ask your health care provider when it is safe to drive.  Use crutches, a cane, or a walker as told by your health care  provider.  If one of your legs is shorter than the other, get fitted for a shoe insert.  Lose weight if you are overweight. How is this prevented?  Wear supportive footwear that is appropriate for your sport.  If you have hip pain, start any new exercise or sport slowly.  Maintain physical fitness, including: ? Strength. ? Flexibility. Contact a health care provider if:  Your pain does not improve with 2-4 weeks. Get help right away if:  You develop severe pain.  You have a fever.  You develop increased redness over your hip.  You have a change in your bowel function or bladder function.  You cannot control the muscles in your feet. This information is not intended to replace advice given to you by your health care provider. Make sure you discuss any questions you have with your health care provider. Document Released: 02/09/2004 Document Revised: 09/07/2015 Document Reviewed: 12/17/2014 Elsevier Interactive Patient Education  [employer].

## 2016-08-17 NOTE — Assessment & Plan Note (Signed)
Her symptoms and exam are most consistent with trochanteric bursitis. Early osteoarthritis of hip can be a possibility as her dull ache get worse at the end of the day and relieved with rest.  She was given a prescription for naproxen 500 mg to be taken as needed twice daily for pain. I advised her to use Voltaren gel and heating pad for symptom relief. She was also advised to avoid sleeping on her left side to give some time to heal. If her symptoms continued to get worse even with these conservative measures, we can think about steroid injection.

## 2016-08-17 NOTE — Assessment & Plan Note (Signed)
Her left foot pain along with some burning sensation at left lateral plantar area, more consistent with plantar fasciitis. She stays on her feet most of the day. She had very mild symptoms, which are intermittent and occurring occasionally 1-2 times a week and lasting for a few minutes and mostly resolved spontaneously, she might be having small nerve compression of the standing the whole day.  I advised her to use to comfortable and well fitted shoes. She should rest her feet in between if possible during the day. Naproxen which was given for her left lateral hip pain can also help with her foot pain. I also advised her to soak her feet in warm salty water when she comes back home to help with this type of ache and mild swelling which might be responsible for this type of pain.

## 2016-08-17 NOTE — Progress Notes (Signed)
   CC: Left lateral hip and upper groin pain for 1 week.  HPI:  [name] is a 51 y.o. with past medical history as listed below came to the clinic with complaint of left lateral hip and upper groin pain for 1 week. She described pain as a dull ache, increased towards the end of the day, improved with the resting and with Aleve. Nonradiating, not associated with any tingling or numbness, does not interfere much with her daily activities. There was no effect on pain with any movements around hip. She denies any recent injury or fall.  She was also complaining of pain and burning sensation along the left lateral plantar region, intermittent, lasting for 2-3 minutes, 1-2 episodes per week, resolved spontaneously. According to patient walking during those episodes make it worse. 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 According to patient she uses sneakers and other comfortable shoes most of the time and avoid high heels. She denies any tingling, numbness or focal weakness in her foot.  Past Medical History:  Diagnosis Date  . Acne   . Allergic rhinitis   . Asthma   . Bacterial vaginitis    recurrent  . Bilateral carpal tunnel syndrome   . Chronic constipation   . External hemorrhoids with complication   . Genital herpes   . High risk sexual behavior   . History of cocaine abuse 1992  . History of tobacco abuse 1999  . Hyperlipidemia   . Hypertension   . Recurrent boils   . Recurrent UTI    Review of Systems:  As per HPI.  Physical Exam:  Vitals:   08/17/16 0925  BP: 130/76  Pulse: 82  Temp: 98.2 F (36.8 C)  TempSrc: Oral  SpO2: 100%  Weight: 194 lb 14.4 oz (88.4 kg)  Height: 5\' 4"  (1.626 m)   Vitals:   08/17/16 0925  BP: 130/76  Pulse: 82  Temp: 98.2 F (36.8 C)  TempSrc: Oral  SpO2: 100%  Weight: 194 lb 14.4 oz (88.4 kg)  Height: 5\' 4"  (1.626 m)   General: Vital signs reviewed.  Patient is well-developed and well-nourished, in no acute  distress and cooperative with exam.  Musculoskeletal:Mild tenderness along left greater trochanteric tubercle extending into left upper groin, range of motion normal at left hip. No apparent erythema or edema. Her foot exam was normal, no sensory or motor deficit. Positional and vibration sense intact. No obvious deformity erythema or edema. Cardiovascular: RRR, S1 normal, S2 normal, no murmurs, gallops, or rubs. Pulmonary/Chest: Clear to auscultation bilaterally, no wheezes, rales, or rhonchi. Abdominal: Soft, non-tender, non-distended, BS +, no masses, organomegaly, or guarding present.  Extremities: No lower extremity edema bilaterally,  pulses symmetric and intact bilaterally. No cyanosis or clubbing. Skin: Warm, dry and intact. No rashes or erythema. Psychiatric: Normal mood and affect. speech and behavior is normal. Cognition and memory are normal.  Assessment & Plan:   See Encounters Tab for problem based charting.  Patient discussed with Dr. Daryll Drown.

## 2016-08-20 NOTE — Progress Notes (Signed)
Internal Medicine Clinic Attending  Case discussed with Dr. Amin at the time of the visit.  We reviewed the resident's history and exam and pertinent patient test results.  I agree with the assessment, diagnosis, and plan of care documented in the resident's note.    

## 2016-08-27 ENCOUNTER — Ambulatory Visit: Payer: BLUE CROSS/BLUE SHIELD

## 2016-08-28 ENCOUNTER — Ambulatory Visit: Payer: BLUE CROSS/BLUE SHIELD

## 2016-09-04 ENCOUNTER — Other Ambulatory Visit: Payer: Self-pay | Admitting: Internal Medicine

## 2016-09-04 DIAGNOSIS — Z1231 Encounter for screening mammogram for malignant neoplasm of breast: Secondary | ICD-10-CM

## 2016-09-11 ENCOUNTER — Ambulatory Visit: Payer: BLUE CROSS/BLUE SHIELD

## 2016-09-25 ENCOUNTER — Ambulatory Visit
Admission: RE | Admit: 2016-09-25 | Discharge: 2016-09-25 | Disposition: A | Discharge status: Home / Self Care | Payer: BLUE CROSS/BLUE SHIELD | Source: Ambulatory Visit | Attending: Internal Medicine | Admitting: Internal Medicine

## 2016-09-25 ENCOUNTER — Ambulatory Visit: Payer: BLUE CROSS/BLUE SHIELD

## 2016-09-25 DIAGNOSIS — Z1231 Encounter for screening mammogram for malignant neoplasm of breast: Secondary | ICD-10-CM

## 2016-10-12 ENCOUNTER — Ambulatory Visit (INDEPENDENT_AMBULATORY_CARE_PROVIDER_SITE_OTHER): Payer: BLUE CROSS/BLUE SHIELD | Admitting: Internal Medicine

## 2016-10-12 ENCOUNTER — Ambulatory Visit (HOSPITAL_COMMUNITY)
Admission: RE | Admit: 2016-10-12 | Discharge: 2016-10-12 | Disposition: A | Discharge status: Home / Self Care | Payer: BLUE CROSS/BLUE SHIELD | Source: Ambulatory Visit | Attending: Student in an Organized Health Care Education/Training Program | Admitting: Student in an Organized Health Care Education/Training Program

## 2016-10-12 VITALS — BP 135/81 | HR 96 | Temp 98.1°F | Ht 64.0 in | Wt 194.7 lb

## 2016-10-12 DIAGNOSIS — K029 Dental caries, unspecified: Secondary | ICD-10-CM | POA: Insufficient documentation

## 2016-10-12 DIAGNOSIS — K047 Periapical abscess without sinus: Secondary | ICD-10-CM | POA: Insufficient documentation

## 2016-10-12 DIAGNOSIS — Z98818 Other dental procedure status: Secondary | ICD-10-CM | POA: Diagnosis not present

## 2016-10-12 DIAGNOSIS — Z8619 Personal history of other infectious and parasitic diseases: Secondary | ICD-10-CM

## 2016-10-12 MED ORDER — CLINDAMYCIN HCL 300 MG PO CAPS: 300.0000 mg | ORAL_CAPSULE | Freq: Three times a day (TID) | ORAL | 0 refills | Status: AC

## 2016-10-12 MED ORDER — VALACYCLOVIR HCL 500 MG PO TABS: 500.0000 mg | ORAL_TABLET | Freq: Two times a day (BID) | ORAL | 0 refills | Status: DC

## 2016-10-12 NOTE — Progress Notes (Signed)
   CC: mouth pain  HPI:  [name] is a 51 y.o. with a PMH of HTN, bilateral knee OA, bilateral carpal tunnel syndrome, and h/o genital herpes presenting to clinic for mouth pain.  Patient endorses 3 week h/o right sided mandibular gum pain and a few day history of right submandibular swelling and pain. Patient states that the pain was exacerbated by salt water rinses and somewhat relieved by magic mouthwash. She endorses feeling a small lump that seems to have increased in size. She denies fevers, chills, dysphagia, odynophagia, shortness of breath.  Please see problem based Assessment and Plan for status of patients chronic conditions.  Past Medical History:  Diagnosis Date  . Acne   . Allergic rhinitis   . Asthma   . Bacterial vaginitis    recurrent  . Bilateral carpal tunnel syndrome   . Chronic constipation   . External hemorrhoids with complication   . Genital herpes   . High risk sexual behavior   . History of cocaine abuse 1992  . History of tobacco abuse 1999  . Hyperlipidemia   . Hypertension   . Recurrent boils   . Recurrent UTI     Review of Systems:   ROS Per HPI  Physical Exam:  Vitals:   10/12/16 1446  BP: 135/81  Pulse: 96  Temp: 98.1 F (36.7 C)  TempSrc: Oral  SpO2: 96%  Weight: 194 lb 11.2 oz (88.3 kg)  Height: 5\' 4"  (1.626 m)   GENERAL- alert, co-operative, appears as stated age, not in any distress. HEENT- Atraumatic, normocephalic, moist mucous membranes, 0.5x0.58mm fluctuant nodule on right base of tongue without drainage; right sided submandibular soft tissue swelling and lymphadenopathy; poor dentition, partial denture on bottom, full denture on top CARDIAC- RRR, no murmurs, rubs or gallops. RESP- Moving equal volumes of air, and clear to auscultation bilaterally, no wheezes or crackles. ABDOMEN- Soft, nontender, bowel sounds present. NEURO- No obvious Cr N abnormality.  Assessment & Plan:   See Encounters Tab for problem based  charting.   Patient seen with Dr. Moshe Cipro, MD Internal Medicine PGY2

## 2016-10-12 NOTE — Patient Instructions (Addendum)
Please take clindamycin 300mg  three times a day. Please schedule an appointment to be seen by a dentist as soon as you can.   Please go upstairs to get your mouth xray completed.  I refilled Valtrex in case you get a flare.

## 2016-10-13 ENCOUNTER — Encounter: Payer: Self-pay | Admitting: Internal Medicine

## 2016-10-13 NOTE — Assessment & Plan Note (Signed)
Patient requested refill of valacyclovir; not currently having outbreak.  Plan: --refilled valacyclovir

## 2016-10-13 NOTE — Assessment & Plan Note (Signed)
Patient with fluctuant nodule on right mandible with right submandibular swelling and lymphadenopathy consistent with dental infection.  She has no airway compromise or signs indicating deep neck space infection at this time.  Plan: --clindamycin (patient has penicillin allergy) --orthopantogram - no bone lesions; caries in remaining mandibular incisors --patient to f/u with dentistry early next week

## 2016-10-16 NOTE — Progress Notes (Signed)
Internal Medicine Clinic Attending  I saw and evaluated the patient.  I personally confirmed the key portions of the history and exam documented by Dr. Jari Favre and I reviewed pertinent patient test results.  The assessment, diagnosis, and plan were formulated together and I agree with the documentation in the resident's note.  Oda Kilts, MD

## 2016-10-18 ENCOUNTER — Telehealth: Payer: Self-pay

## 2016-10-18 MED ORDER — IBUPROFEN 800 MG PO TABS: 800.0000 mg | ORAL_TABLET | Freq: Three times a day (TID) | ORAL | 0 refills | Status: DC | PRN

## 2016-10-18 NOTE — Telephone Encounter (Signed)
Requesting test results. Please call pt back.  

## 2016-10-18 NOTE — Telephone Encounter (Addendum)
  Reason for call:   I placed an outgoing call to Carrie. Carrie Franco at 2:44 PM regarding her dental pain and xray. I informed patient that her orthopantogram did not show signs of abscess at the site of her pain or any bony erosion. Carrie Franco informed me that she broke out in a rash on her abdomen when she started taking the clindamycin so she has stopped it. She was able to see the dentist who thought her nodule appeared more like an ulcer and prescribed topical steroid creme to be placed for a week; if no improvement, patient states that plan was for a biopsy. She states as of now she is no better and no worse than at her last clinic visit. She is requesting ibuprofen for pain.   Assessment/ Plan:   As no clear sign of abscess or cellulitis and likely more thorough exam able to be completed by dentist, will not prescribe further antibiotics at this time and advised patient to stick to the plan she had made with the dentist. This plan was discussed and agreed upon with Dr. Rebeca Alert who also evaluated patient in clinic.  Refilled ibuprofen 800mg  TID PRN to her pharmacy.  Patient had no further concerns or questions.  As always, pt is advised that if symptoms worsen or new symptoms arise, they should go to an urgent care facility or to to ER for further evaluation.   Alphonzo Grieve, MD   10/18/2016, 2:41 PM

## 2016-11-14 ENCOUNTER — Other Ambulatory Visit: Payer: Self-pay | Admitting: Internal Medicine

## 2016-11-14 DIAGNOSIS — L309 Dermatitis, unspecified: Secondary | ICD-10-CM

## 2016-11-14 DIAGNOSIS — G8929 Other chronic pain: Secondary | ICD-10-CM

## 2016-11-14 DIAGNOSIS — M542 Cervicalgia: Principal | ICD-10-CM

## 2016-11-23 ENCOUNTER — Telehealth: Payer: Self-pay | Admitting: *Deleted

## 2016-11-23 NOTE — Telephone Encounter (Signed)
[facility] states pt says that her dental implant facility wants her to have labs, [facility] [phone], called and was told someone would rtc to me, will await rtc

## 2016-11-23 NOTE — Telephone Encounter (Signed)
Pt had called earlier to make lab appt, traceyf. Reviewed chart for orders and there are none, it was noted she had seen pcp in quite awhile, called [facility]. To cancel lab appt and schedule appt 12/4 w/ dr molt and then it can be determined what lab orders need to be entered. Sending to dr molt

## 2016-11-26 ENCOUNTER — Other Ambulatory Visit: Payer: BLUE CROSS/BLUE SHIELD

## 2016-11-26 ENCOUNTER — Other Ambulatory Visit (INDEPENDENT_AMBULATORY_CARE_PROVIDER_SITE_OTHER): Payer: BLUE CROSS/BLUE SHIELD

## 2016-11-26 ENCOUNTER — Other Ambulatory Visit: Payer: Self-pay | Admitting: *Deleted

## 2016-11-26 DIAGNOSIS — I10 Essential (primary) hypertension: Secondary | ICD-10-CM

## 2016-11-26 DIAGNOSIS — R6889 Other general symptoms and signs: Secondary | ICD-10-CM

## 2016-11-26 DIAGNOSIS — L309 Dermatitis, unspecified: Secondary | ICD-10-CM

## 2016-11-26 MED ORDER — TRIAMCINOLONE ACETONIDE 0.025 % EX OINT: TOPICAL_OINTMENT | Freq: Three times a day (TID) | CUTANEOUS | 3 refills | Status: DC

## 2016-11-26 NOTE — Telephone Encounter (Signed)
rec'd faxed orders this am, orders entered

## 2016-11-27 LAB — CBC WITH DIFFERENTIAL/PLATELET
BASOS ABS: 0 10*3/uL (ref 0.0–0.2)
Basos: 1 %
EOS (ABSOLUTE): 0.2 10*3/uL (ref 0.0–0.4)
Eos: 4 %
HEMATOCRIT: 37.5 % (ref 34.0–46.6)
Hemoglobin: 12.3 g/dL (ref 11.1–15.9)
Immature Grans (Abs): 0 10*3/uL (ref 0.0–0.1)
Immature Granulocytes: 0 %
LYMPHS ABS: 2.3 10*3/uL (ref 0.7–3.1)
Lymphs: 39 %
MCH: 28.7 pg (ref 26.6–33.0)
MCHC: 32.8 g/dL (ref 31.5–35.7)
MCV: 88 fL (ref 79–97)
MONOS ABS: 0.3 10*3/uL (ref 0.1–0.9)
Monocytes: 5 %
NEUTROS ABS: 3.1 10*3/uL (ref 1.4–7.0)
NEUTROS PCT: 51 %
Platelets: 314 10*3/uL (ref 150–379)
RBC: 4.28 x10E6/uL (ref 3.77–5.28)
RDW: 14.5 % (ref 12.3–15.4)
WBC: 6 10*3/uL (ref 3.4–10.8)

## 2016-11-27 LAB — CMP14 + ANION GAP
A/G RATIO: 1.8 (ref 1.2–2.2)
ALT: 16 IU/L (ref 0–32)
AST: 16 IU/L (ref 0–40)
Albumin: 4.8 g/dL (ref 3.5–5.5)
Alkaline Phosphatase: 121 IU/L — ABNORMAL HIGH (ref 39–117)
Anion Gap: 20 mmol/L — ABNORMAL HIGH (ref 10.0–18.0)
BUN/Creatinine Ratio: 26 — ABNORMAL HIGH (ref 9–23)
BUN: 15 mg/dL (ref 6–24)
CALCIUM: 10.4 mg/dL — AB (ref 8.7–10.2)
CHLORIDE: 100 mmol/L (ref 96–106)
CO2: 22 mmol/L (ref 20–29)
Creatinine, Ser: 0.58 mg/dL (ref 0.57–1.00)
GFR, EST AFRICAN AMERICAN: 123 mL/min/{1.73_m2} (ref >59)
GFR, EST NON AFRICAN AMERICAN: 106 mL/min/{1.73_m2} (ref >59)
GLOBULIN, TOTAL: 2.7 g/dL (ref 1.5–4.5)
Glucose: 90 mg/dL (ref 65–99)
POTASSIUM: 4.1 mmol/L (ref 3.5–5.2)
SODIUM: 142 mmol/L (ref 134–144)
Total Protein: 7.5 g/dL (ref 6.0–8.5)

## 2016-11-27 NOTE — Progress Notes (Signed)
Results of lab work from 11/26/16 faxed to [facility]. Jaquita Folds DMD, MD by Renato Battles on 11/27/16 at 9:25am.

## 2016-12-18 ENCOUNTER — Encounter: Payer: BLUE CROSS/BLUE SHIELD | Admitting: Internal Medicine

## 2016-12-18 ENCOUNTER — Telehealth: Payer: Self-pay | Admitting: *Deleted

## 2016-12-18 ENCOUNTER — Encounter: Payer: Self-pay | Admitting: Internal Medicine

## 2016-12-18 NOTE — Telephone Encounter (Addendum)
Pt did not show for appt today, she would like her lab work sent to her dentist [phone] Fax [phone] Also she would like a copy Per dr molt schedule pt with her asap

## 2016-12-18 NOTE — Progress Notes (Deleted)
   CC: "I need labs for dental work"  HPI:  [name] is a 51 y.o.   Past Medical History:  Diagnosis Date  . Acne   . Allergic rhinitis   . Asthma   . Bacterial vaginitis    recurrent  . Bilateral carpal tunnel syndrome   . Chronic constipation   . External hemorrhoids with complication   . Genital herpes   . High risk sexual behavior   . History of cocaine abuse 1992  . History of tobacco abuse 1999  . Hyperlipidemia   . Hypertension   . Recurrent boils   . Recurrent UTI    Review of Systems:   General: Denies fevers, chills, weight loss, fatigue HEENT: Denies changes in vision, sore throat, dysphagia Cardiac: Denies CP, SOB, palpitations Pulmonary: Denies cough, wheezes, PND Abd: Denies diarrhea, constipation, changes in bowels Extremities: Denies weakness or swelling  Physical Exam: General: Alert, in no acute distress. Pleasant and conversant HEENT: No icterus, injection or ptosis. No hoarseness or dysarthria  Cardiac: RRR, no MGR appreciated Pulmonary: CTA BL with normal WOB on RA. Able to speak in complete sentences Abd: Soft, non-tended. +bs Extremities: Warm, perfused. No significant pedal edema.    There were no vitals filed for this visit. ***  Assessment & Plan:   See Encounters Tab for problem based charting.  Patient {GC/GE:3044014::"discussed with","seen with"} Dr. {NAMES:[id number]::"Butcher","Granfortuna","E. Hoffman","Klima","Mullen","Narendra","Raines","Vincent"}

## 2016-12-25 ENCOUNTER — Encounter: Payer: BLUE CROSS/BLUE SHIELD | Admitting: Internal Medicine

## 2017-01-02 ENCOUNTER — Other Ambulatory Visit: Payer: Self-pay

## 2017-01-02 ENCOUNTER — Ambulatory Visit (INDEPENDENT_AMBULATORY_CARE_PROVIDER_SITE_OTHER): Payer: BLUE CROSS/BLUE SHIELD | Admitting: Internal Medicine

## 2017-01-02 ENCOUNTER — Ambulatory Visit: Payer: BLUE CROSS/BLUE SHIELD

## 2017-01-02 VITALS — BP 135/88 | HR 83 | Temp 98.4°F | Ht 64.0 in | Wt 191.8 lb

## 2017-01-02 DIAGNOSIS — M79604 Pain in right leg: Secondary | ICD-10-CM

## 2017-01-02 DIAGNOSIS — F3289 Other specified depressive episodes: Secondary | ICD-10-CM

## 2017-01-02 DIAGNOSIS — I10 Essential (primary) hypertension: Secondary | ICD-10-CM

## 2017-01-02 DIAGNOSIS — R252 Cramp and spasm: Secondary | ICD-10-CM | POA: Diagnosis not present

## 2017-01-02 DIAGNOSIS — Z87891 Personal history of nicotine dependence: Secondary | ICD-10-CM

## 2017-01-02 DIAGNOSIS — K120 Recurrent oral aphthae: Secondary | ICD-10-CM | POA: Diagnosis not present

## 2017-01-02 DIAGNOSIS — M79605 Pain in left leg: Secondary | ICD-10-CM | POA: Diagnosis not present

## 2017-01-02 DIAGNOSIS — K5900 Constipation, unspecified: Secondary | ICD-10-CM | POA: Diagnosis not present

## 2017-01-02 DIAGNOSIS — R531 Weakness: Secondary | ICD-10-CM | POA: Diagnosis not present

## 2017-01-02 DIAGNOSIS — F329 Major depressive disorder, single episode, unspecified: Secondary | ICD-10-CM

## 2017-01-02 DIAGNOSIS — Z9181 History of falling: Secondary | ICD-10-CM

## 2017-01-02 DIAGNOSIS — M79641 Pain in right hand: Secondary | ICD-10-CM | POA: Diagnosis not present

## 2017-01-02 DIAGNOSIS — F32A Depression, unspecified: Secondary | ICD-10-CM | POA: Insufficient documentation

## 2017-01-02 DIAGNOSIS — Z79899 Other long term (current) drug therapy: Secondary | ICD-10-CM

## 2017-01-02 DIAGNOSIS — M79642 Pain in left hand: Secondary | ICD-10-CM

## 2017-01-02 DIAGNOSIS — R410 Disorientation, unspecified: Secondary | ICD-10-CM

## 2017-01-02 HISTORY — DX: Weakness: R53.1

## 2017-01-02 MED ORDER — FLUOXETINE HCL 20 MG PO CAPS: 20.0000 mg | ORAL_CAPSULE | Freq: Every day | ORAL | 2 refills | Status: DC

## 2017-01-02 NOTE — Assessment & Plan Note (Addendum)
Carrie Franco is currently on HCTZ 12.5 mg and 10 mg amlodipine daily. Given her hypercalcemia and going to stop this medication today. Her blood pressure was 135/88 and I will do a trial off a second agent given her history of control of this in the past. -Discontinue HCTZ 12.5 mg -Continue amlodipine 10 mg -Trial of monotherapy however could start another agent at her follow-up visit in 1 month

## 2017-01-02 NOTE — Assessment & Plan Note (Signed)
Carrie Franco describes multiple falls over the past few months due to bilateral lower extremity weakness and cramping. This could be related to her high calcium levels which is currently being addressed however I think she should work with physical therapy to regain strength and mobility. -Refer to physical therapy for evaluation and treatment

## 2017-01-02 NOTE — Assessment & Plan Note (Signed)
This seems to be first detected 8/16 during which time she was not on hydrochlorothiazide. She had been on thiazide diuretic 2014 through 2015 however this was discontinued due to good blood pressure control. She was restarted on his medicine 5/17. Her hypercalcemia is mild with values ranging from 10.2 through 10.5. That being said she does seem to have symptoms consistent with hypercalcemia including fatigue, depression, weakness, pain and muscle spasms. I suspect primary hyperparathyroidism. -Check calcium level today -Ordered parathyroid hormone level -Ordered vitamin D level -Further therapy will be tailored the results of these tests

## 2017-01-02 NOTE — Patient Instructions (Addendum)
It was good seeing you today! Thank you for coming to our appointment!  I am so sorry you are not feeling well. I think this is likely related to your high calcium levels. I am going to check several labs today to see if we cant figure out why this is going on. It sounds like your parathyroid hormones are off.   I am STOPPING HCTZ. This medicine increases calcium levels.   I am also starting you on Prozac. Please take 1/2 tab for the first week, then 1 tab from then on out.   Come back and see me in 1 month!

## 2017-01-02 NOTE — Progress Notes (Signed)
Internal Medicine Clinic Attending  Case discussed with Dr. Molt at the time of the visit.  We reviewed the resident's history and exam and pertinent patient test results.  I agree with the assessment, diagnosis, and plan of care documented in the resident's note. 

## 2017-01-02 NOTE — Assessment & Plan Note (Signed)
Carrie Franco was quite depressed on exam today. She is very tearful and described many components of major depressive disorder over the past 6 months. This is new for the patient as she was consistently upbeat. She denies any prior episodes of depression or similar symptoms. Her boyfriend of several years notes this is very new for the patient. She can't tell me any specific triggers or major life events recently around the time of symptoms start. She did however leave her job of 20 years due to her symptoms as described elsewhere. Certainly the loss of her job and her current symptoms are likely contributing to her low mood. Given her high calcium I wonder if hypercalcemia is playing a role as well. -Start Prozac 20 mg daily. Patient instructed to take 10 mg daily for the first week, then 20 mg daily after -Was counseled that will take several weeks to notice any changes with this medicine however was informed to call me should she notice worsening mood or suicidal thoughts. -Please follow-up her responses medication at her follow-up appointment in 1 month. Please repeat pHQ9 time as well

## 2017-01-02 NOTE — Progress Notes (Signed)
CC: depression, follow-up HTN  HPI:  Ms.Nasya Jenetta Downer Kobayashi is a 51 y.o. female with medical history as outlined below here for follow-up of her hypertension as well as depression. She also complains to me today of intermittent confusion, bilateral leg pain and hand pain, muscle cramping of her legs and hands as well as falls because of that.  Depression: Very tearful on exam today. Patient reports significant depression as manifested by withdrawal from social activities, feeling down/depressed, decreased appetite and difficulty sleeping however overall feeling fatigued. This is been going on for the past 6 months and she denied a prior history of similar episodes. She can't really think of any significant triggering factor. She has had to leave her job of 20 years in the past few months due to symptoms described below. Boyfriend of 4 years who is present reiterated her significant depression and tells me this is not the same person she was. She has no suicidal or homicidal ideations.  Hypercalcemia: Chart review shows persistent mild hypercalcemia (10.2-10.5) detected since 8/16. At this time she was not on hydrochlorothiazide. HCTZ was restarted 5/17. She does not take any multivitamins or calcium supplements. Most recent measurement 11/18 was 10.4. At that time her anion gap was 20 with a BUN/creatinine ratio of 26. She describes to me feeling fatigued with generalized weakness. She's been having more frequent muscle spasms of her bilateral lower extremities as well as now bilateral hands. These symptoms have made work difficult for the patient and she quit her job of 20 years due to weakness and frequent falls.  Recurrent oral ulcer: She's been seeing several dentist recently for recurring ulcer on the inner aspect of her lower jaw. She tells me several biopsies were negative for malignancy. Dentist have been attributed this to increased stress. She had recurrence of 1 about 4 days ago.  Depression  screen PHQ 2/9 01/02/2017  Decreased Interest 3  Down, Depressed, Hopeless 3  PHQ - 2 Score 6  Altered sleeping 3  Tired, decreased energy 3  Change in appetite 3  Feeling bad or failure about yourself  3  Trouble concentrating 3  Moving slowly or fidgety/restless 3  Suicidal thoughts 0  PHQ-9 Score 24  Difficult doing work/chores Not difficult at all  Some recent data might be hidden   Past Medical History:  Diagnosis Date  . Acne   . Allergic rhinitis   . Asthma   . Bacterial vaginitis    recurrent  . Bilateral carpal tunnel syndrome   . Chronic constipation   . External hemorrhoids with complication   . Genital herpes   . High risk sexual behavior   . History of cocaine abuse 1992  . History of tobacco abuse 1999  . Hyperlipidemia   . Hypertension   . Recurrent boils   . Recurrent UTI    Review of Systems:   General: +Fatigue. Denies fevers, chills HEENT: Denies changes in vision, dysphagia Cardiac: Denies CP, palpitations Pulmonary: Denies cough, wheezing, orthopnea Abd: +Constipation. Denies diarrhea Extremities: Denies weakness or swelling Psych: +Depressed. No SI  Physical Exam: General: Alert. Very tearful. Frustrated HEENT: One small clean based ulcer on the base of tongue. Some clear fluid with bubbles noted on tympanic membrane bilaterally. No erythema, bulging, no purulence. No hoarseness or dysarthria Cardiac: RRR, no MGR appreciated. Pulmonary: CTA BL with normal WOB on RA. Able to speak in complete sentences Abd: Soft, non-tender. Hypoactive bowel sounds Extremities: Warm, perfused. Some edema of bilateral feet and right  hand.  Vitals:   01/02/17 0833  BP: 135/88  Pulse: 83  Temp: 98.4 F (36.9 C)  TempSrc: Oral  SpO2: 99%  Weight: 191 lb 12.8 oz (87 kg)  Height: 5\' 4"  (1.626 m)   Assessment & Plan:   See Encounters Tab for problem based charting.  Patient discussed with Dr. Evette Doffing

## 2017-01-03 LAB — PTH, INTACT AND CALCIUM: Calcium: 10 mg/dL (ref 8.7–10.2)

## 2017-01-03 LAB — CMP14 + ANION GAP
A/G RATIO: 1.9 (ref 1.2–2.2)
ALT: 14 IU/L (ref 0–32)
AST: 16 IU/L (ref 0–40)
Albumin: 4.9 g/dL (ref 3.5–5.5)
Alkaline Phosphatase: 120 IU/L — ABNORMAL HIGH (ref 39–117)
Anion Gap: 17 mmol/L (ref 10.0–18.0)
BILIRUBIN TOTAL: 0.2 mg/dL (ref 0.0–1.2)
BUN/Creatinine Ratio: 20 (ref 9–23)
BUN: 13 mg/dL (ref 6–24)
CHLORIDE: 104 mmol/L (ref 96–106)
CO2: 22 mmol/L (ref 20–29)
Calcium: 9.9 mg/dL (ref 8.7–10.2)
Creatinine, Ser: 0.66 mg/dL (ref 0.57–1.00)
GFR calc Af Amer: 117 mL/min/{1.73_m2} (ref >59)
GFR calc non Af Amer: 102 mL/min/{1.73_m2} (ref >59)
GLUCOSE: 100 mg/dL — AB (ref 65–99)
Globulin, Total: 2.6 g/dL (ref 1.5–4.5)
POTASSIUM: 4.4 mmol/L (ref 3.5–5.2)
Sodium: 143 mmol/L (ref 134–144)
Total Protein: 7.5 g/dL (ref 6.0–8.5)

## 2017-01-03 LAB — VITAMIN D 25 HYDROXY (VIT D DEFICIENCY, FRACTURES): Vit D, 25-Hydroxy: 15.9 ng/mL — ABNORMAL LOW (ref 30.0–100.0)

## 2017-01-21 ENCOUNTER — Other Ambulatory Visit (INDEPENDENT_AMBULATORY_CARE_PROVIDER_SITE_OTHER): Payer: BLUE CROSS/BLUE SHIELD

## 2017-01-21 ENCOUNTER — Other Ambulatory Visit: Payer: Self-pay | Admitting: Internal Medicine

## 2017-01-22 LAB — PARATHYROID HORMONE, INTACT (NO CA): PTH: 38 pg/mL (ref 15–65)

## 2017-01-22 LAB — SPECIMEN STATUS REPORT

## 2017-01-23 ENCOUNTER — Ambulatory Visit: Payer: BLUE CROSS/BLUE SHIELD | Admitting: Rehabilitative and Restorative Service Providers"

## 2017-01-23 ENCOUNTER — Encounter: Payer: BLUE CROSS/BLUE SHIELD | Admitting: Internal Medicine

## 2017-01-28 ENCOUNTER — Encounter: Payer: Self-pay | Admitting: Physical Therapy

## 2017-01-28 ENCOUNTER — Ambulatory Visit
Payer: BLUE CROSS/BLUE SHIELD | Attending: Student in an Organized Health Care Education/Training Program | Admitting: Physical Therapy

## 2017-01-28 DIAGNOSIS — M6281 Muscle weakness (generalized): Secondary | ICD-10-CM | POA: Diagnosis not present

## 2017-01-28 DIAGNOSIS — R2689 Other abnormalities of gait and mobility: Secondary | ICD-10-CM | POA: Diagnosis present

## 2017-01-28 DIAGNOSIS — R2681 Unsteadiness on feet: Secondary | ICD-10-CM

## 2017-01-28 DIAGNOSIS — Z9181 History of falling: Secondary | ICD-10-CM | POA: Diagnosis not present

## 2017-01-28 NOTE — Therapy (Addendum)
[facility] [address] [place], [place], [address] Phone: [phone]   Fax:  [phone]  Physical Therapy Evaluation/Discharge  Patient Details  Name: LORINE IANNACCONE MRN: [id number] Date of Birth: 23-Mar-1965 Referring Provider: Einar Gip, DO / Attending: Axel Filler, MD    Encounter Date: 01/28/2017  PT End of Session - 01/28/17 1313    Visit Number  1    Number of Visits  12    Date for PT Re-Evaluation  03/11/17    Authorization Type  [employer] 30 visit limit    PT Start Time  1233    PT Stop Time  1312    PT Time Calculation (min)  39 min    Equipment Utilized During Treatment  Gait belt    Activity Tolerance  Patient tolerated treatment well;Patient limited by pain    Behavior During Therapy  [facility] for tasks assessed/performed       Past Medical History:  Diagnosis Date  . Acne   . Allergic rhinitis   . Asthma   . Bacterial vaginitis    recurrent  . Bilateral carpal tunnel syndrome   . Chronic constipation   . External hemorrhoids with complication   . Genital herpes   . High risk sexual behavior   . History of cocaine abuse 1992  . History of tobacco abuse 1999  . Hyperlipidemia   . Hypertension   . Recurrent boils   . Recurrent UTI     Past Surgical History:  Procedure Laterality Date  . tummy tuck  03/2010    There were no vitals filed for this visit.   Subjective Assessment - 01/28/17 1235    Subjective  Pt is a 52 y/o female who presents to OPPT with c/o increased falls for ~ 1 year.  Pt also reports bil knee pain, which pt reports is "bone on bone."      Pertinent History  OA bil knee; asthma    Limitations  Walking    Patient Stated Goals  improve balance    Currently in Pain?  Yes    Pain Score  7  up to 10/10; at best 5/10    Pain Location  Knee    Pain Orientation  Right;Left Rt>Lt    Pain Descriptors / Indicators  Aching;Sharp;Dull    Pain Type  Chronic pain    Pain  Onset  More than a month ago    Pain Frequency  Constant    Aggravating Factors   standing, walking, kneeling, kneeling    Pain Relieving Factors  medication, heat will monitor but will not directly address         [facility] PT Assessment - 01/28/17 1240      Assessment   Medical Diagnosis  frequent falls    Referring Provider  Einar Gip, DO / Attending: Axel Filler, MD     Onset Date/Surgical Date  -- 1 year    Hand Dominance  Right    Next MD Visit  02/06/17    Prior Therapy  following Rt knee scope: Feb 2016      Precautions   Precautions  Fall      Restrictions   Weight Bearing Restrictions  No      Balance Screen   Has the patient fallen in the past 6 months  Yes    How many times?  20 - reports her knees buckle    Has the patient had a decrease in activity  level because of a fear of falling?   Yes    Is the patient reluctant to leave their home because of a fear of falling?   Yes doesn't go by herself      Home Environment   Living Environment  Private residence    Living Arrangements  Alone;Spouse/significant other has [place] that stays with her occasionally    Available Help at Discharge  Family;Friend(s)    Type of [place] to enter    Entrance Stairs-Number of Steps  5    Entrance Stairs-Rails  Right;Left;Cannot reach both    [place]  Two level;Able to live on main level with bedroom/bathroom    [place] - 4 wheels    Additional Comments  has rollator: uses when out in community      Prior Function   Level of [place] has been getting assistance with ADLs due to Rt hand (sept)    Vocation  Unemployed    Vocation Requirements  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; owner of daycare    Leisure  watch movies; go out to [employer], travel      Cognition   Overall Cognitive Status  Within Functional Limits for tasks assessed    Area of Impairment  --      ROM / Strength   AROM /  PROM / Strength  Strength      Strength   Overall Strength Comments  submaximal effort given    Strength Assessment Site  Hip;Knee;Ankle    Right/Left Hip  Right;Left    Right Hip Flexion  4/5    Left Hip Flexion  4/5    Right/Left Knee  Right;Left    Right Knee Flexion  3/5    Right Knee Extension  4/5    Left Knee Flexion  3/5    Left Knee Extension  4/5    Right/Left Ankle  Right;Left    Right Ankle Dorsiflexion  5/5    Left Ankle Dorsiflexion  5/5      Ambulation/Gait   Ambulation/Gait  Yes    Ambulation/Gait Assistance  5: Supervision    Ambulation Distance (Feet)  150 Feet    Assistive device  None    Gait Pattern  Decreased stance time - right;Decreased step length - left;Right flexed knee in stance;Antalgic decreased Rt TKE at heel strike    Ambulation Surface  Level;Indoor    Gait velocity  1.96 ft/sec 66m 16.75 sec       Standardized Balance Assessment   Standardized Balance Assessment  Berg Balance Test;Timed Up and Go Test      Berg Balance Test   Sit to Stand  Able to stand  independently using hands    Standing Unsupported  Able to stand 2 minutes with supervision    Sitting with Back Unsupported but Feet Supported on Floor or Stool  Able to sit safely and securely 2 minutes    Stand to Sit  Sits independently, has uncontrolled descent    Transfers  Able to transfer safely, definite need of hands    Standing Unsupported with Eyes Closed  Able to stand 10 seconds safely    Standing Ubsupported with Feet Together  Able to place feet together independently and stand 1 minute safely    From Standing, Reach Forward with Outstretched Arm  Can reach forward >12 cm safely (5")    From  Standing Position, Pick up Object from Floor  Able to pick up shoe, needs supervision    From Standing Position, Turn to Look Behind Over each Shoulder  Looks behind from both sides and weight shifts well    Turn 360 Degrees  Able to turn 360 degrees safely but slowly    Standing  Unsupported, Alternately Place Feet on Step/Stool  Able to stand independently and complete 8 steps >20 seconds    Standing Unsupported, One Foot in Front  Able to plae foot ahead of the other independently and hold 30 seconds    Standing on One Leg  Tries to lift leg/unable to hold 3 seconds but remains standing independently    Total Score  41      Timed Up and Go Test   Normal TUG (seconds)  14.06             Objective measurements completed on examination: See above findings.                   PT Long Term Goals - 01/28/17 1510      PT LONG TERM GOAL #1   Title  independent with HEP    Status  New    Target Date  03/11/17      PT LONG TERM GOAL #2   Title  improve BERG balance score to >/= 47/56 for improved balance and decreased fall risk    Status  New    Target Date  03/11/17      PT LONG TERM GOAL #3   Title  improve timed up and go to < 13 sec for decreased fall risk    Status  New    Target Date  03/11/17      PT LONG TERM GOAL #4   Title  improve gait velocity to > 2.32 ft/sec for improved community access    Status  New    Target Date  03/11/17      PT LONG TERM GOAL #5   Title  amb > 300' with LRAD modified independent on indoor/paved outdoor surfaces for improved community access    Status  New    Target Date  03/11/17             Plan - 01/28/17 1456    Clinical Impression Statement  Pt is a 52 y/o female who presents to OPPT for decreased balance and hx of falls.  Pt reports bil knee pain and previous xrays showed bil OA.  Pt reports ~ 20 falls in 6 months and mostly attributes this to her Rt knee buckling.  Will not directly address her knee pain but will attempt to improve balance and decrease fall risk through balance and strengthening program.      History and Personal Factors relevant to plan of care:  bil knee OA, hypercalemia, HTN, depression    Clinical Presentation  Evolving    Clinical Presentation due to:  20 falls in  6 months    Clinical Decision Making  Moderate    Rehab Potential  Fair    PT Frequency  2x / week    PT Duration  6 weeks    PT Treatment/Interventions  ADLs/Self Care Home Management;Cryotherapy;Electrical Stimulation;Moist Heat;Therapeutic exercise;Therapeutic activities;Functional mobility training;Stair training;Gait training;DME Instruction;Ultrasound;Balance training;Neuromuscular re-education;Patient/family education;Manual techniques;Taping    PT Next Visit Plan  extablish HEP for strengthening; add hamstring and hip flexor stretch, gait and balance exercises    Consulted and Agree with Plan of Care  Patient       Patient will benefit from skilled therapeutic intervention in order to improve the following deficits and impairments:  Abnormal gait, Decreased balance, Impaired flexibility, Difficulty walking, Decreased mobility, Decreased strength, Pain  Visit Diagnosis: Other abnormalities of gait and mobility - Plan: PT plan of care cert/re-cert  Unsteadiness on feet - Plan: PT plan of care cert/re-cert  History of falling - Plan: PT plan of care cert/re-cert  Muscle weakness (generalized) - Plan: PT plan of care cert/re-cert     Problem List Patient Active Problem List   Diagnosis Date Noted  . Hypercalcemia 01/02/2017  . Depression 01/02/2017  . Weakness 01/02/2017  . Dental infection 10/12/2016  . Trochanteric bursitis of left hip 08/17/2016  . Flexor tenosynovitis of thumb 01/19/2016  . Unilateral primary osteoarthritis, left knee 12/05/2015  . Osteoarthritis of right knee 12/05/2015  . Trigeminal neuralgia of right side of face 11/09/2015  . Eczema 04/01/2015  . Chronic neck pain 03/08/2015  . Plantar fasciitis of right foot 12/03/2014  . History of colonic polyps 09/07/2014  . Diverticulosis of colon 09/07/2014  . Internal hemorrhoids 09/07/2014  . Normocytic anemia 09/07/2014  . H/O arthroscopy of knee 10/29/2013  . Hyperlipidemia 07/31/2013  . Bilateral  knee pain 06/09/2013  . GERD (gastroesophageal reflux disease) 03/19/2013  . Plantar fasciitis of left foot 01/12/2013  . Obesity (BMI 30.0-34.9) 08/05/2012  . Carpal tunnel syndrome on both sides 10/26/2011  . De Quervain's tenosynovitis, left 04/09/2011  . Persistent asthma 02/23/2010  . Preventative health care 02/23/2010  . Essential hypertension 01/13/2007  . History of herpes genitalis 01/02/2006  . Allergic rhinitis 01/02/2006      Laureen Abrahams, PT, DPT 01/28/17 3:14 PM    [facility] [address] [place], [place], [address] Phone: [phone]   Fax:  [phone]  Name: SAYRA FRISBY MRN: [id number] Date of Birth: 08/23/1965         PHYSICAL THERAPY DISCHARGE SUMMARY  Visits from Start of Care: 1  Current functional level related to goals / functional outcomes: See above   Remaining deficits: unknown   Education / Equipment: n/a  Plan: Patient agrees to discharge.  Patient goals were not met. Patient is being discharged due to not returning since the last visit.  ?????     Laureen Abrahams, PT, DPT 03/26/17 3:50 PM  [facility] [address] [place], [place] [address]  [phone] (office) [phone] (fax)

## 2017-02-06 ENCOUNTER — Encounter: Payer: Self-pay | Admitting: Internal Medicine

## 2017-02-06 ENCOUNTER — Ambulatory Visit (INDEPENDENT_AMBULATORY_CARE_PROVIDER_SITE_OTHER): Payer: BLUE CROSS/BLUE SHIELD | Admitting: Internal Medicine

## 2017-02-06 ENCOUNTER — Ambulatory Visit: Payer: BLUE CROSS/BLUE SHIELD | Admitting: Physical Therapy

## 2017-02-06 ENCOUNTER — Other Ambulatory Visit: Payer: Self-pay | Admitting: *Deleted

## 2017-02-06 VITALS — BP 145/82 | HR 84 | Temp 98.1°F | Ht 64.0 in | Wt 196.3 lb

## 2017-02-06 DIAGNOSIS — F3289 Other specified depressive episodes: Secondary | ICD-10-CM

## 2017-02-06 DIAGNOSIS — K219 Gastro-esophageal reflux disease without esophagitis: Secondary | ICD-10-CM

## 2017-02-06 DIAGNOSIS — M6281 Muscle weakness (generalized): Secondary | ICD-10-CM | POA: Insufficient documentation

## 2017-02-06 DIAGNOSIS — Z9181 History of falling: Secondary | ICD-10-CM | POA: Diagnosis not present

## 2017-02-06 DIAGNOSIS — R52 Pain, unspecified: Secondary | ICD-10-CM | POA: Diagnosis not present

## 2017-02-06 DIAGNOSIS — F329 Major depressive disorder, single episode, unspecified: Secondary | ICD-10-CM

## 2017-02-06 HISTORY — DX: Muscle weakness (generalized): M62.81

## 2017-02-06 MED ORDER — AMLODIPINE BESYLATE 10 MG PO TABS: 10.0000 mg | ORAL_TABLET | Freq: Every day | ORAL | 0 refills | Status: DC

## 2017-02-06 MED ORDER — PANTOPRAZOLE SODIUM 40 MG PO TBEC: 40.0000 mg | DELAYED_RELEASE_TABLET | Freq: Every day | ORAL | 1 refills | Status: DC

## 2017-02-06 MED ORDER — ALBUTEROL SULFATE HFA 108 (90 BASE) MCG/ACT IN AERS: 2.0000 | INHALATION_SPRAY | Freq: Four times a day (QID) | RESPIRATORY_TRACT | 2 refills | Status: DC | PRN

## 2017-02-06 NOTE — Assessment & Plan Note (Signed)
PHQ9 today 20. She notices improvement on Prozac 20mg  however feels that it's making her hair fall out. Discussed risks/benefits of changing medication and will continue at current dose. At some point I would like to increase her dose however will defer for now given her other extensive work-up.

## 2017-02-06 NOTE — Progress Notes (Addendum)
   CC: follow-up of weakness, falls, hypertension, depression  HPI:  [name] is a 52 y.o. F here for follow-up of weakness and falls. Patient seen 1 month ago at which time she mentioned a 1 year history of weakness and falls. Weakness initially noted in legs and would experience falls because "her legs didn't have enough strength." She has at least 2 falls per week, and states it would be more if her husband wasn't there to catch her all the time. She no longer works due to the weakness and falls. She is unable to climb stairs head-on and instead must side-step up the stairs due to weakness of her thighs. She also endorses arm weakness, mostly in her shoulders and in addition has had a swollen right hand for 1 month. This makes it difficult for her to complete her ADLs and her husband must assist her in the bathroom. She uses a walker when out in community. She was previously independent however for the past year has had to quit her job due to frequent falling. 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   In addition to the falls and weakness, she mentions recurrent oral ulcers over the past several months. These were biopsied twice and were negative for carcinoma, but there was ulceration, granulation tissue and inflamed minor salivary gland in the biopsies. She denies any rashes but does complain of muscle pain and "bone pain" of her shoulders, hands, legs and thighs. Occasionally she will have muscle spasms as well.   She denies any tick bites, camping, travel, etc. She denies any family history of lupus or similar symptoms. She denies any vision changes, numbness, urinary incontinence, jaw pain.  Past Medical History:  Diagnosis Date  . Acne   . Allergic rhinitis   . Asthma   . Bacterial vaginitis    recurrent  . Bilateral carpal tunnel syndrome   . Chronic constipation   . External hemorrhoids with complication   . Genital herpes   . High risk sexual behavior   . History of cocaine abuse  1992  . History of tobacco abuse 1999  . Hyperlipidemia   . Hypertension   . Recurrent boils   . Recurrent UTI    Review of Systems:   General: +fatigue. Denies fevers, weight loss HEENT: Denies changes in vision, sore throat, dysphagia. Denies facial rash, jaw pain, jaw claudication, scalp tenderness. +hair loss Cardiac: Denies CP, SOB Pulmonary: Denies cough, PND Extremities: +weakness, swelling. No rash.   Physical Exam: General: Alert, in no acute distress. Pleasant and conversant but very curious about my exam questions HEENT: No icterus, injection or ptosis. No hoarseness or dysarthria. Facial muscles symmetric. 1 small oral ulcer Cardiac: RRR, no MGR appreciated Pulmonary: CTA BL with normal WOB on RA. Able to speak in complete sentences Abd: Soft, non-tender. +bs Extremities: Warm, perfused. No significant pedal edema. Weakness appreciated BL UE and LE. Weakness of hip flexors and shoulders seems most prominent. ?Sclerodactyly. Right hand edematous, no erythema.   Vitals:   02/06/17 1540  BP: (!) 145/82  Pulse: 84  Temp: 98.1 F (36.7 C)  TempSrc: Oral  SpO2: 100%  Weight: 196 lb 4.8 oz (89 kg)  Height: 5\' 4"  (1.626 m)   Assessment & Plan:   See Encounters Tab for problem based charting.  Patient discussed with Dr. Daryll Drown

## 2017-02-06 NOTE — Assessment & Plan Note (Addendum)
Patient here with 1 year history of progressive weakness and associated falls. She describes weakness and pain in her thighs, hips, shoulders and hands. She has been having falls and now no longer works due to her symptoms. She does have weakness on exam, most pronounced in hip flexors today but did have weakness with shoulder shrugging.  She also has history of joint pain, recurrent oral ulcers, possible thickening of skin of fingers, hair loss and also has had intermittent joint swelling - currently with a swollen right hand.  She no longer works due to her symptoms. Differential diagnosis is broad and I am ordering an extensive work-up today for further evaluation as I worry she may have an underlying autoimmune/rheumatologic process. She does not have tick exposure so I will defer Lyme disease testing at this point. If the above testing is unrevealing, she would benefit from neurology referral and EMG/NCV testing +/- muscle biopsy.  -ESR, CRP -TSH -CK, aldolase -ANA, anti-smitih -Hepc, HIV -Anti-Jo, Anti-La -LDH -HbA1c  ADDENDUM: Several results returned the following morning, significant for ESR 50 and CRP 11. 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 Will continue to follow remaining results.

## 2017-02-06 NOTE — Patient Instructions (Signed)
It was good seeing you again today! I'm sorry I'm taking so many blood samples today. I think it's worthwhile to check a ton of labs today so we do not have to re-stick you in the future.   Please come back in 1 month. I will call you with the results of the tests, but it could take 1-2 weeks for several of them.

## 2017-02-07 LAB — C-REACTIVE PROTEIN: CRP: 10.8 mg/L — AB (ref 0.0–4.9)

## 2017-02-07 LAB — SEDIMENTATION RATE: Sed Rate: 49 mm/hr — ABNORMAL HIGH (ref 0–40)

## 2017-02-07 LAB — HEPATITIS C ANTIBODY: Hep C Virus Ab: <0.1 s/co ratio (ref 0.0–0.9)

## 2017-02-07 LAB — TSH: TSH: 1.16 u[IU]/mL (ref 0.450–4.500)

## 2017-02-07 LAB — SJOGRENS SYNDROME-B EXTRACTABLE NUCLEAR ANTIBODY

## 2017-02-07 LAB — ANTI-SMITH ANTIBODY: ENA SM Ab Ser-aCnc: <0.2 AI (ref 0.0–0.9)

## 2017-02-07 LAB — HEMOGLOBIN A1C
ESTIMATED AVERAGE GLUCOSE: 108 mg/dL
Hgb A1c MFr Bld: 5.4 % (ref 4.8–5.6)

## 2017-02-07 LAB — CK: Total CK: 159 U/L (ref 24–173)

## 2017-02-07 LAB — HIV ANTIBODY (ROUTINE TESTING W REFLEX): HIV SCREEN 4TH GENERATION: NONREACTIVE

## 2017-02-07 LAB — ANTINUCLEAR ANTIBODIES, IFA: ANA Titer 1: NEGATIVE

## 2017-02-07 LAB — SJOGRENS SYNDROME-A EXTRACTABLE NUCLEAR ANTIBODY

## 2017-02-07 LAB — LACTATE DEHYDROGENASE: LDH: 172 IU/L (ref 119–226)

## 2017-02-07 LAB — ALDOLASE: Aldolase: 4.3 U/L (ref 3.3–10.3)

## 2017-02-07 MED ORDER — PREDNISONE 10 MG PO TABS: 15.0000 mg | ORAL_TABLET | Freq: Every day | ORAL | 1 refills | Status: AC

## 2017-02-07 NOTE — Addendum Note (Signed)
Addended by: Orson Gear on: 02/07/2017 10:16 AM   Modules accepted: Orders

## 2017-02-07 NOTE — Progress Notes (Signed)
Internal Medicine Clinic Attending  Case discussed with Dr. Molt at the time of the visit.  We reviewed the resident's history and exam and pertinent patient test results.  I agree with the assessment, diagnosis, and plan of care documented in the resident's note. 

## 2017-02-07 NOTE — Addendum Note (Signed)
Addended by: Tamsen Roers on: 02/07/2017 09:13 AM   Modules accepted: Orders

## 2017-02-08 ENCOUNTER — Ambulatory Visit: Payer: BLUE CROSS/BLUE SHIELD | Admitting: Physical Therapy

## 2017-02-12 ENCOUNTER — Telehealth: Payer: Self-pay | Admitting: Physical Therapy

## 2017-02-12 ENCOUNTER — Ambulatory Visit: Payer: BLUE CROSS/BLUE SHIELD | Admitting: Physical Therapy

## 2017-02-12 NOTE — Telephone Encounter (Signed)
Attempted to call pt x 2 today due to 3rd no show for PT.  No answer and mailbox is full, so unable to leave message.  Per no show policy, will cancel all appts except next appt.  If pt doesn't show for next appt will d/c from PT services.  If pt does show, will allow to schedule visits one at a time.  Laureen Abrahams, PT, DPT 02/12/17 9:57 AM

## 2017-02-15 ENCOUNTER — Ambulatory Visit: Payer: BLUE CROSS/BLUE SHIELD | Admitting: Physical Therapy

## 2017-02-15 ENCOUNTER — Ambulatory Visit: Payer: BLUE CROSS/BLUE SHIELD | Attending: *Deleted | Admitting: Physical Therapy

## 2017-02-20 ENCOUNTER — Ambulatory Visit: Payer: BLUE CROSS/BLUE SHIELD | Admitting: Physical Therapy

## 2017-02-21 LAB — MAGNESIUM: Magnesium: 2 mg/dL (ref 1.6–2.3)

## 2017-02-21 LAB — SPECIMEN STATUS REPORT

## 2017-02-21 LAB — RHEUMATOID FACTOR: Rhuematoid fact SerPl-aCnc: <10 IU/mL (ref 0.0–13.9)

## 2017-02-22 ENCOUNTER — Ambulatory Visit: Payer: BLUE CROSS/BLUE SHIELD | Admitting: Physical Therapy

## 2017-02-27 ENCOUNTER — Ambulatory Visit: Payer: BLUE CROSS/BLUE SHIELD | Admitting: Physical Therapy

## 2017-02-28 ENCOUNTER — Telehealth: Payer: Self-pay

## 2017-02-28 NOTE — Telephone Encounter (Signed)
Requesting Potassium med to be filled @ [facility].

## 2017-02-28 NOTE — Telephone Encounter (Signed)
Pt requesting a refill - Potassium is not on current med list.

## 2017-03-01 ENCOUNTER — Ambulatory Visit: Payer: BLUE CROSS/BLUE SHIELD | Admitting: Physical Therapy

## 2017-03-01 NOTE — Telephone Encounter (Signed)
Dr molt, does pt needs lab work or just a refill of K+. Please answer ASAP

## 2017-03-01 NOTE — Telephone Encounter (Signed)
I DC'd her Potassium in December as I dc'd the medication which could cause HypoK (HCTZ). She actually has only 1 low K value in the chart, several years ago. If she really would like a potassium Rx, she should have a lab visit first. Happy to put in order for BMET if she is interested.

## 2017-03-01 NOTE — Telephone Encounter (Signed)
Patient is calling again for Potassium. Please call patient back.

## 2017-03-07 NOTE — Telephone Encounter (Signed)
Called pt - mailbox is full, unable to leave message. 

## 2017-04-18 ENCOUNTER — Ambulatory Visit: Payer: BLUE CROSS/BLUE SHIELD

## 2017-04-18 NOTE — Progress Notes (Deleted)
   CC: follow-up of   HPI:  [name] is a 52 y.o.      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 Bipsied 2x, most recently 12/18 - both biopsies without evidence for carcinoma but ulceration, granulation tissue and inflamed minor salivary gland was appreciated. Patient was under significant stress, and no clear etiology was found.     Secondary syphillis - patchy, moth-eaten hair loss. RPR not reactive 2010    ANA, Anti-ro, Anti-la, Smooth muscle, negative       Past Medical History:  Diagnosis Date  . Acne   . Allergic rhinitis   . Asthma   . Bacterial vaginitis    recurrent  . Bilateral carpal tunnel syndrome   . Chronic constipation   . External hemorrhoids with complication   . Genital herpes   . High risk sexual behavior   . History of cocaine abuse 1992  . History of tobacco abuse 1999  . Hyperlipidemia   . Hypertension   . Recurrent boils   . Recurrent UTI    Review of Systems:   General: Denies fevers, chills, weight loss, fatigue HEENT: Denies changes in vision, sore throat, dysphagia Cardiac: Denies CP, SOB, palpitations Pulmonary: Denies cough, wheezes, PND Abd: Denies diarrhea, constipation, changes in bowels Extremities: Denies weakness or swelling  Physical Exam: General: Alert, in no acute distress. Pleasant and conversant HEENT: No icterus, injection or ptosis. No hoarseness or dysarthria  Cardiac: RRR, no MGR appreciated Pulmonary: CTA BL with normal WOB on RA. Able to speak in complete sentences Abd: Soft, non-tender. +bs Extremities: Warm, perfused. No significant pedal edema.    There were no vitals filed for this visit. ***  Assessment & Plan:   See Encounters Tab for problem based charting.  Patient {GC/GE:[id number]::"discussed with","seen with"} Dr. {NAMES:[id number]::"Butcher","Granfortuna","E. Hoffman","Klima","Mullen","Narendra","Raines","Vincent"}

## 2017-04-23 ENCOUNTER — Ambulatory Visit (INDEPENDENT_AMBULATORY_CARE_PROVIDER_SITE_OTHER): Payer: BLUE CROSS/BLUE SHIELD | Admitting: Internal Medicine

## 2017-04-23 VITALS — BP 141/76 | HR 93 | Temp 98.4°F | Wt 200.3 lb

## 2017-04-23 DIAGNOSIS — G5603 Carpal tunnel syndrome, bilateral upper limbs: Secondary | ICD-10-CM | POA: Diagnosis not present

## 2017-04-23 DIAGNOSIS — Z791 Long term (current) use of non-steroidal anti-inflammatories (NSAID): Secondary | ICD-10-CM

## 2017-04-23 MED ORDER — MELOXICAM 15 MG PO TABS: 15.0000 mg | ORAL_TABLET | Freq: Every day | ORAL | 0 refills | Status: DC

## 2017-04-23 NOTE — Progress Notes (Signed)
   CC: Bilateral hand pain  HPI:  [name] is a 52 y.o. female with past medical history outlined below here for bilateral hand pain. For the details of today's visit, please refer to the assessment and plan.  Past Medical History:  Diagnosis Date  . Acne   . Allergic rhinitis   . Asthma   . Bacterial vaginitis    recurrent  . Bilateral carpal tunnel syndrome   . Chronic constipation   . External hemorrhoids with complication   . Genital herpes   . High risk sexual behavior   . History of cocaine abuse 1992  . History of tobacco abuse 1999  . Hyperlipidemia   . Hypertension   . Recurrent boils   . Recurrent UTI     Review of Systems  Musculoskeletal:       Bilateral hand pain  Neurological: Positive for tingling and sensory change.    Physical Exam:  Vitals:   04/23/17 1421  BP: (!) 141/76  Pulse: 93  Temp: 98.4 F (36.9 C)  TempSrc: Oral  SpO2: 100%  Weight: 200 lb 4.8 oz (90.9 kg)    Constitutional: NAD, appears comfortable Pulmonary/Chest: Breathing comfortably on RA Extremities: Warm and well perfused. No edema. +Tinel's sign and Phalen's maneuver  Skin: No rashes or erythema  Psychiatric: Normal mood and affect  Assessment & Plan:   See Encounters Tab for problem based charting.  Patient discussed with Dr. Angelia Mould

## 2017-04-23 NOTE — Assessment & Plan Note (Addendum)
Patient is presenting with symptoms consistent with bilateral carpal tunnel syndrome.  She is endorsing numbness, tingling, and pain involving her thumb, index, and middle finger.  Patient was seen by her orthopedic surgeon earlier today who also told her this was most likely carpal tunnel and advised bilateral bracing.  Again, I advised obtaining wrist stabilizing braces from her pharmacy. Advised her to wear it as much as possible but especially at night. We will do a trial of scheduled NSAIDs. -- Bilateral wrist brace -- Meloxicam 15 mg daily x 1-2 weeks -- Follow up as needed

## 2017-04-23 NOTE — Patient Instructions (Signed)
FOLLOW-UP INSTRUCTIONS When: Next available PCP For: Follow up What to bring: Medications  Carrie Franco,  It was a pleasure to see you. For your carpal tunnel, please purchase wrist splints from your pharamcy for immobilization. Please wear them as much as possible, especially at night. You may take mobic 15 mg daily for the pain. Please take this consistently for at least a week. Please make an appiontment to follow up with your PCP. If you have any questions or concerns, call our clinic at [phone] or after hours call [phone] and ask for the internal medicine resident on call. Thank you.  - Dr. Philipp Ovens   Carpal Tunnel Syndrome Carpal tunnel syndrome is a condition that causes pain in your hand and arm. The carpal tunnel is a narrow area located on the palm side of your wrist. Repeated wrist motion or certain diseases may cause swelling within the tunnel. This swelling pinches the main nerve in the wrist (median nerve). What are the causes? This condition may be caused by:  Repeated wrist motions.  Wrist injuries.  Arthritis.  A cyst or tumor in the carpal tunnel.  Fluid buildup during pregnancy.  Sometimes the cause of this condition is not known. What increases the risk? This condition is more likely to develop in:  People who have jobs that cause them to repeatedly move their wrists in the same motion, such as Art gallery manager.  Women.  People with certain conditions, such as: ? Diabetes. ? Obesity. ? An underactive thyroid (hypothyroidism). ? Kidney failure.  What are the signs or symptoms? Symptoms of this condition include:  A tingling feeling in your fingers, especially in your thumb, index, and middle fingers.  Tingling or numbness in your hand.  An aching feeling in your entire arm, especially when your wrist and elbow are bent for long periods of time.  Wrist pain that goes up your arm to your shoulder.  Pain that goes down into your palm or  fingers.  A weak feeling in your hands. You may have trouble grabbing and holding items.  Your symptoms may feel worse during the night. How is this diagnosed? This condition is diagnosed with a medical history and physical exam. You may also have tests, including:  An electromyogram (EMG). This test measures electrical signals sent by your nerves into the muscles.  X-rays.  How is this treated? Treatment for this condition includes:  Lifestyle changes. It is important to stop doing or modify the activity that caused your condition.  Physical or occupational therapy.  Medicines for pain and inflammation. This may include medicine that is injected into your wrist.  A wrist splint.  Surgery.  Follow these instructions at home: If you have a splint:  Wear it as told by your health care provider. Remove it only as told by your health care provider.  Loosen the splint if your fingers become numb and tingle, or if they turn cold and blue.  Keep the splint clean and dry. General instructions  Take over-the-counter and prescription medicines only as told by your health care provider.  Rest your wrist from any activity that may be causing your pain. If your condition is work related, talk to your employer about changes that can be made, such as getting a wrist pad to use while typing.  If directed, apply ice to the painful area: ? Put ice in a plastic bag. ? Place a towel between your skin and the bag. ? Leave the ice on for  20 minutes, 2-3 times per day.  Keep all follow-up visits as told by your health care provider. This is important.  Do any exercises as told by your health care provider, physical therapist, or occupational therapist. Contact a health care provider if:  You have new symptoms.  Your pain is not controlled with medicines.  Your symptoms get worse. This information is not intended to replace advice given to you by your health care provider. Make sure you  discuss any questions you have with your health care provider. Document Released: 12/30/1999 Document Revised: 05/12/2015 Document Reviewed: 05/19/2014 Elsevier Interactive Patient Education  [employer].

## 2017-04-29 NOTE — Progress Notes (Signed)
Internal Medicine Clinic Attending  Case discussed with Dr. Guilloud at the time of the visit.  We reviewed the resident's history and exam and pertinent patient test results.  I agree with the assessment, diagnosis, and plan of care documented in the resident's note.  

## 2017-05-20 ENCOUNTER — Other Ambulatory Visit: Payer: Self-pay

## 2017-05-20 MED ORDER — FLUOXETINE HCL 20 MG PO CAPS: 20.0000 mg | ORAL_CAPSULE | Freq: Every day | ORAL | 0 refills | Status: DC

## 2017-05-20 NOTE — Telephone Encounter (Signed)
FLUoxetine (PROZAC) 20 MG capsule, refill request @ [facility].

## 2017-05-28 ENCOUNTER — Encounter: Payer: Self-pay | Admitting: Internal Medicine

## 2017-05-28 ENCOUNTER — Other Ambulatory Visit: Payer: Self-pay

## 2017-05-28 ENCOUNTER — Ambulatory Visit (INDEPENDENT_AMBULATORY_CARE_PROVIDER_SITE_OTHER): Payer: BLUE CROSS/BLUE SHIELD | Admitting: Internal Medicine

## 2017-05-28 VITALS — BP 153/84 | HR 89 | Temp 99.2°F | Ht 62.0 in | Wt 202.0 lb

## 2017-05-28 DIAGNOSIS — I10 Essential (primary) hypertension: Secondary | ICD-10-CM

## 2017-05-28 DIAGNOSIS — G5603 Carpal tunnel syndrome, bilateral upper limbs: Secondary | ICD-10-CM

## 2017-05-28 DIAGNOSIS — Z9181 History of falling: Secondary | ICD-10-CM | POA: Diagnosis not present

## 2017-05-28 DIAGNOSIS — F331 Major depressive disorder, recurrent, moderate: Secondary | ICD-10-CM

## 2017-05-28 DIAGNOSIS — F339 Major depressive disorder, recurrent, unspecified: Secondary | ICD-10-CM

## 2017-05-28 DIAGNOSIS — M6281 Muscle weakness (generalized): Secondary | ICD-10-CM

## 2017-05-28 DIAGNOSIS — Z79899 Other long term (current) drug therapy: Secondary | ICD-10-CM | POA: Diagnosis not present

## 2017-05-28 MED ORDER — FLUOXETINE HCL 20 MG PO CAPS
40.0000 mg | ORAL_CAPSULE | Freq: Every day | ORAL | 0 refills | Status: DC
Start: 2017-05-28 — End: 2018-04-16

## 2017-05-28 MED ORDER — LISINOPRIL 10 MG PO TABS: 10.0000 mg | ORAL_TABLET | Freq: Every day | ORAL | 0 refills | Status: DC

## 2017-05-28 MED ORDER — AMLODIPINE BESYLATE 10 MG PO TABS: 10.0000 mg | ORAL_TABLET | Freq: Every day | ORAL | 2 refills | Status: DC

## 2017-05-28 NOTE — Patient Instructions (Signed)
It was great seeing you again today!  I'm glad you noticed some improvement on the Prozac. Lets increase it to 40mg  daily.  Today your blood pressure was elevated. Please continue taking Amlodipine 10mg  but we will start Lisinopril 10mg  today as well. This should also help with leg swelling.   Please return to clinic in 1 month for follow-up blood pressure and also labs to check your kidneys

## 2017-05-28 NOTE — Assessment & Plan Note (Addendum)
Assessment: Patient mentioned at tail-end of visit that the course of Prednisone I prescribed several months ago for suspicion of PMR was very helpful. She was unable to walk without assistance of a walker prior to steroids but now is functionally independent. She does still mention some mild weakness but has good strength and normal gait on exam. No temporal tenderness or vision changes.  Plan: PMR remains on differential given her high ESR, CRP and significant response to steroids. Not currently very symptomatic, will defer further steroids for now but will refer to rheumatology for evaluation for possible PMR.

## 2017-05-28 NOTE — Assessment & Plan Note (Addendum)
Assessment: Hypertensive today despite reported compliance with Amlodipine 10mg . She was previously on HCTZ but was d/c due to persistent hypercalcemia.  Plan: Start Lisinopril 10mg  today and continue Amlodipine 10mg . Asked for metabolic panel but patient declined, but was informed she will need to provide a blood sample at our next visit in 86-month to ensure her renal function and electrolytes are stable with the recent medication adjustment.

## 2017-05-28 NOTE — Assessment & Plan Note (Signed)
Assessment: Patient notes improved mood since starting Prozac 20mg  several months ago. Husband agrees, but both feel its not yet controlled. PHQ-9 17 today, improved from 24 6 months ago. She denies any suicidal or homicidal ideations.  Plan: Increase Prozac to 40mg  daily. Discussed counseling, agreeable but does not want referral.

## 2017-05-28 NOTE — Progress Notes (Signed)
   CC: follow-up of depression, HTN  HPI:  [name] is a 52 y.o. F with HTN, depression, carpal tunnel syndrome and history of generalized weakness/falls who presents today to follow-up HTN and depression. She has no acute complaints.  For details regarding today's visit and the status of their chronic medical issues, please refer to the assessment and plan.  Past Medical History:  Diagnosis Date  . Acne   . Allergic rhinitis   . Asthma   . Bacterial vaginitis    recurrent  . Bilateral carpal tunnel syndrome   . Chronic constipation   . External hemorrhoids with complication   . Genital herpes   . High risk sexual behavior   . History of cocaine abuse 1992  . History of tobacco abuse 1999  . Hyperlipidemia   . Hypertension   . Recurrent boils   . Recurrent UTI    Review of Systems:   General: Denies fevers, chills, weight loss HEENT: Denies acute changes in vision, headache, dysphagia Cardiac: Denies CP, SOB, palpitations Pulmonary: Denies cough or wheezing Abd: Denies abdominal pain, changes in bowels Extremities: Admits to improved weakness. Some mild leg swelling at end of day.  Physical Exam: General: Alert, in no acute distress. Initially guarded with flat affect but pleasant and conversant by end of evaluation. No SI/HI. HEENT: No icterus, injection or ptosis. No hoarseness or dysarthria. No temporal TTP. Cardiac: RRR, no MGR appreciated Pulmonary: CTA BL with normal WOB on RA. Able to speak in complete sentences Abd: Soft, non-tender. +bs Extremities: Warm, perfused. No pitting edema.   Vitals:   05/28/17 1558  BP: (!) 153/84  Pulse: 89  Temp: 99.2 F (37.3 C)  TempSrc: Oral  SpO2: 98%  Weight: 202 lb (91.6 kg)  Height: 5\' 2"  (1.575 m)   Body mass index is 36.95 kg/m.     Office Visit from 05/28/2017 in [place]  PHQ-9 Total Score  17      Assessment & Plan:   See Encounters Tab for problem based  charting.  Patient discussed with Dr. Angelia Mould

## 2017-05-30 NOTE — Progress Notes (Signed)
Internal Medicine Clinic Attending  Case discussed with Dr. Molt at the time of the visit.  We reviewed the resident's history and exam and pertinent patient test results.  I agree with the assessment, diagnosis, and plan of care documented in the resident's note. 

## 2017-06-18 ENCOUNTER — Emergency Department (HOSPITAL_BASED_OUTPATIENT_CLINIC_OR_DEPARTMENT_OTHER)
Admit: 2017-06-18 | Discharge: 2017-06-18 | Disposition: A | Discharge status: Home / Self Care | Payer: BLUE CROSS/BLUE SHIELD | Attending: Emergency Medicine | Admitting: Emergency Medicine

## 2017-06-18 ENCOUNTER — Encounter (HOSPITAL_COMMUNITY): Payer: Self-pay | Admitting: Emergency Medicine

## 2017-06-18 ENCOUNTER — Emergency Department (HOSPITAL_COMMUNITY)
Admission: EM | Admit: 2017-06-18 | Discharge: 2017-06-18 | Disposition: A | Discharge status: Home / Self Care | Payer: BLUE CROSS/BLUE SHIELD | Attending: Emergency Medicine | Admitting: Emergency Medicine

## 2017-06-18 DIAGNOSIS — Z79899 Other long term (current) drug therapy: Secondary | ICD-10-CM | POA: Diagnosis not present

## 2017-06-18 DIAGNOSIS — M79604 Pain in right leg: Secondary | ICD-10-CM | POA: Diagnosis present

## 2017-06-18 DIAGNOSIS — Z87891 Personal history of nicotine dependence: Secondary | ICD-10-CM | POA: Diagnosis not present

## 2017-06-18 DIAGNOSIS — J45909 Unspecified asthma, uncomplicated: Secondary | ICD-10-CM | POA: Insufficient documentation

## 2017-06-18 DIAGNOSIS — I10 Essential (primary) hypertension: Secondary | ICD-10-CM | POA: Insufficient documentation

## 2017-06-18 DIAGNOSIS — M5410 Radiculopathy, site unspecified: Secondary | ICD-10-CM | POA: Diagnosis not present

## 2017-06-18 DIAGNOSIS — M79609 Pain in unspecified limb: Secondary | ICD-10-CM | POA: Diagnosis not present

## 2017-06-18 MED ORDER — ONDANSETRON 4 MG PO TBDP
4.0000 mg | ORAL_TABLET | Freq: Once | ORAL | Status: AC
  Administered 2017-06-18: 4 mg via ORAL
  Filled 2017-06-18: qty 1

## 2017-06-18 MED ORDER — IBUPROFEN 800 MG PO TABS: 800.0000 mg | ORAL_TABLET | Freq: Three times a day (TID) | ORAL | 0 refills | Status: DC

## 2017-06-18 MED ORDER — OXYCODONE-ACETAMINOPHEN 5-325 MG PO TABS
1.0000 | ORAL_TABLET | Freq: Once | ORAL | Status: AC
  Administered 2017-06-18: 1 via ORAL
  Filled 2017-06-18: qty 1

## 2017-06-18 NOTE — Progress Notes (Signed)
Right lower extremity venous duplex has been completed. Negative for DVT. 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 Results were given to Eliezer Mccoy PA.  06/18/17 10:39 AM Carlos Levering RVT

## 2017-06-18 NOTE — ED Provider Notes (Signed)
[facility] EMERGENCY DEPARTMENT Provider Note   CSN: [id number] Arrival date & time: 06/18/17  0920   History   Chief Complaint Chief Complaint  Patient presents with  . Leg Pain    HPI Carrie Franco is a 52 y.o. female.  HPI   52 year old female presents today with complaints of right-sided leg pain.  Patient has pain from her right buttocks down to her knee.  She notes pain with movement, pain with palpation of the posterior thigh.  She denies any redness, she notes subjective swelling, she denies any warmth fever.  Flexion range of motion the knee.  She denies any trauma.  She notes vague sensation of lower back pain, denies any loss of distal sensation strength and motor function.  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  Patient notes over-the-counter medications including Tylenol have not improved her symptoms.  No history DVT.    Past Medical History:  Diagnosis Date  . Acne   . Allergic rhinitis   . Asthma   . Bacterial vaginitis    recurrent  . Bilateral carpal tunnel syndrome   . Chronic constipation   . External hemorrhoids with complication   . Genital herpes   . High risk sexual behavior   . History of cocaine abuse 1992  . History of tobacco abuse 1999  . Hyperlipidemia   . Hypertension   . Recurrent boils   . Recurrent UTI     Patient Active Problem List   Diagnosis Date Noted  . Muscle weakness (generalized) 02/06/2017  . Hypercalcemia 01/02/2017  . Depression 01/02/2017  . Weakness 01/02/2017  . Trochanteric bursitis of left hip 08/17/2016  . Flexor tenosynovitis of thumb 01/19/2016  . Unilateral primary osteoarthritis, left knee 12/05/2015  . Osteoarthritis of right knee 12/05/2015  . Trigeminal neuralgia of right side of face 11/09/2015  . Eczema 04/01/2015  . Chronic neck pain 03/08/2015  . Plantar fasciitis of right foot 12/03/2014  . History of colonic polyps 09/07/2014  .  Normocytic anemia 09/07/2014  . Hyperlipidemia 07/31/2013  . Bilateral knee pain 06/09/2013  . GERD (gastroesophageal reflux disease) 03/19/2013  . Plantar fasciitis of left foot 01/12/2013  . Obesity (BMI 30.0-34.9) 08/05/2012  . Carpal tunnel syndrome 10/26/2011  . De Quervain's tenosynovitis, left 04/09/2011  . Persistent asthma 02/23/2010  . Preventative health care 02/23/2010  . Essential hypertension 01/13/2007  . History of herpes genitalis 01/02/2006  . Allergic rhinitis 01/02/2006    Past Surgical History:  Procedure Laterality Date  . tummy tuck  03/2010     OB History    Gravida  4   Para  3   Term      Preterm      AB  1   Living  3     SAB  1   TAB      Ectopic      Multiple      Live Births               Home Medications    Prior to Admission medications   Medication Sig Start Date End Date Taking? Authorizing Provider  albuterol (PROVENTIL HFA;VENTOLIN HFA) 108 (90 Base) MCG/ACT inhaler Inhale 2 puffs into the lungs every 6 (six) hours as needed for wheezing or shortness of breath. 02/06/17   Molt, Bethany, DO  amLODipine (NORVASC) 10 MG tablet Take 1 tablet (10 mg total) by mouth daily. 05/28/17  Molt, Bethany, DO  cetirizine (ZYRTEC) 10 MG tablet TAKE ONE TABLET BY MOUTH ONCE DAILY 01/24/16   Molt, Bethany, DO  FLUoxetine (PROZAC) 20 MG capsule Take 2 capsules (40 mg total) by mouth daily. 05/28/17 05/28/18  Molt, Bethany, DO  fluticasone (FLONASE) 50 MCG/ACT nasal spray Place 2 sprays into both nostrils daily. 06/11/14   Juluis Mire, MD  ibuprofen (ADVIL,MOTRIN) 800 MG tablet Take 1 tablet (800 mg total) by mouth 3 (three) times daily. 06/18/17   Reaghan Kawa, Dellis Filbert, PA-C  lisinopril (PRINIVIL,ZESTRIL) 10 MG tablet Take 1 tablet (10 mg total) by mouth daily. 05/28/17   Molt, Bethany, DO  pantoprazole (PROTONIX) 40 MG tablet Take 1 tablet (40 mg total) by mouth daily. 02/06/17   Molt, Bethany, DO    Family History Family History  Problem  Relation Age of Onset  . Diabetes Other   . Cancer Mother     Social History Social History   Tobacco Use  . Smoking status: Former Smoker    Types: Cigarettes  . Smokeless tobacco: Never Used  . Tobacco comment: quit 42yrs ago  Substance Use Topics  . Alcohol use: No    Alcohol/week: 0.0 oz  . Drug use: No     Allergies   Hydrocodone-acetaminophen; Orange fruit [citrus]; Orange oil; Clindamycin/lincomycin; Meloxicam; and Penicillins   Review of Systems Review of Systems  All other systems reviewed and are negative.    Physical Exam Updated Vital Signs BP (!) 138/91   Pulse 80   Temp 97.9 F (36.6 C) (Oral)   Resp 17   Ht 5\' 3"  (1.6 m)   Wt 91.2 kg (201 lb)   LMP 11/22/2012   SpO2 99%   BMI 35.61 kg/m   Physical Exam  Constitutional: She is oriented to person, place, and time. She appears well-developed and well-nourished.  HENT:  Head: Normocephalic and atraumatic.  Eyes: Pupils are equal, round, and reactive to light. Conjunctivae are normal. Right eye exhibits no discharge. Left eye exhibits no discharge. No scleral icterus.  Neck: Normal range of motion. No JVD present. No tracheal deviation present.  Pulmonary/Chest: Effort normal. No stridor.  Musculoskeletal:  Lower extremities symmetrical bilaterally no swelling or edema, tenderness palpation of the right posterior thigh, no warmth to touch, knee atraumatic full active range of motion nontender, no redness, sensation strength and motor function intact-and is palpation of the lower lumbar right-sided soft tissue musculature and right gluteus  Neurological: She is alert and oriented to person, place, and time. Coordination normal.  Psychiatric: She has a normal mood and affect. Her behavior is normal. Judgment and thought content normal.  Nursing note and vitals reviewed.   ED Treatments / Results  Labs (all labs ordered are listed, but only abnormal results are displayed) Labs Reviewed - No data to  display  EKG None  Radiology No results found.  Procedures Procedures (including critical care time)  Medications Ordered in ED Medications  oxyCODONE-acetaminophen (PERCOCET/ROXICET) 5-325 MG per tablet 1 tablet (1 tablet Oral Given 06/18/17 1236)  ondansetron (ZOFRAN-ODT) disintegrating tablet 4 mg (4 mg Oral Given 06/18/17 1236)     Initial Impression / Assessment and Plan / ED Course  I have reviewed the triage vital signs and the nursing notes.  Pertinent labs & imaging results that were available during my care of the patient were reviewed by me and considered in my medical decision making (see chart for details).     52 year old female presents today with complaints of leg pain this  is likely radicular pain coming from her back, question sciatic nerve.  Patient with no acute neurological deficits, no red flags no signs of infection.  She will be treated symptomatically as an outpatient, she is given strict return precautions, she verbalized understanding and agreement to today's plan had no further questions or concerns. Final Clinical Impressions(s) / ED Diagnoses   Final diagnoses:  Radicular pain    ED Discharge Orders        Ordered    ibuprofen (ADVIL,MOTRIN) 800 MG tablet  3 times daily     06/18/17 1230       Okey Regal, PA-C 06/18/17 1448    Davonna Belling, MD 06/18/17 2221

## 2017-06-18 NOTE — ED Notes (Signed)
Pain medication given at discharge. Patient advised about side effects of medications and  to avoid driving for a minimum of 4 hours.

## 2017-06-18 NOTE — Discharge Instructions (Addendum)
Please read attached information. If you experience any new or worsening signs or symptoms please return to the emergency room for evaluation. Please follow-up with your primary care provider or specialist as discussed. Please use medication prescribed only as directed and discontinue taking if you have any concerning signs or symptoms.   °

## 2017-06-18 NOTE — ED Triage Notes (Signed)
Pt states she started having severe right leg pain since yesterday. Pt states pain is in her calf and behind her knee. Swelling and tender to the touch.

## 2017-06-19 ENCOUNTER — Other Ambulatory Visit: Payer: Self-pay | Admitting: Internal Medicine

## 2017-06-19 DIAGNOSIS — G8929 Other chronic pain: Secondary | ICD-10-CM

## 2017-06-19 DIAGNOSIS — M542 Cervicalgia: Principal | ICD-10-CM

## 2017-06-21 NOTE — Progress Notes (Deleted)
Office Visit Note  Patient: Carrie Franco             Date of Birth: 1965-12-06           MRN: [id number]             PCP: Einar Gip, DO Referring: Einar Gip, DO Visit Date: 07/04/2017 Occupation: @GUAROCC @    Subjective:  No chief complaint on file.   History of Present Illness: Carrie Franco is a 52 y.o. female ***   Activities of Daily Living:  Patient reports morning stiffness for *** {minute/hour:19697}.   Patient {ACTIONS;DENIES/REPORTS:21021675::"Denies"} nocturnal pain.  Difficulty dressing/grooming: {ACTIONS;DENIES/REPORTS:21021675::"Denies"} Difficulty climbing stairs: {ACTIONS;DENIES/REPORTS:21021675::"Denies"} Difficulty getting out of chair: {ACTIONS;DENIES/REPORTS:21021675::"Denies"} Difficulty using hands for taps, buttons, cutlery, and/or writing: {ACTIONS;DENIES/REPORTS:21021675::"Denies"}   No Rheumatology ROS completed.   PMFS History:  Patient Active Problem List   Diagnosis Date Noted  . Muscle weakness (generalized) 02/06/2017  . Hypercalcemia 01/02/2017  . Depression 01/02/2017  . Weakness 01/02/2017  . Trochanteric bursitis of left hip 08/17/2016  . Flexor tenosynovitis of thumb 01/19/2016  . Unilateral primary osteoarthritis, left knee 12/05/2015  . Osteoarthritis of right knee 12/05/2015  . Trigeminal neuralgia of right side of face 11/09/2015  . Eczema 04/01/2015  . Chronic neck pain 03/08/2015  . Plantar fasciitis of right foot 12/03/2014  . History of colonic polyps 09/07/2014  . Normocytic anemia 09/07/2014  . Hyperlipidemia 07/31/2013  . Bilateral knee pain 06/09/2013  . GERD (gastroesophageal reflux disease) 03/19/2013  . Plantar fasciitis of left foot 01/12/2013  . Obesity (BMI 30.0-34.9) 08/05/2012  . Carpal tunnel syndrome 10/26/2011  . De Quervain's tenosynovitis, left 04/09/2011  . Persistent asthma 02/23/2010  . Preventative health care 02/23/2010  . Essential hypertension 01/13/2007  . History of herpes genitalis  01/02/2006  . Allergic rhinitis 01/02/2006    Past Medical History:  Diagnosis Date  . Acne   . Allergic rhinitis   . Asthma   . Bacterial vaginitis    recurrent  . Bilateral carpal tunnel syndrome   . Chronic constipation   . External hemorrhoids with complication   . Genital herpes   . High risk sexual behavior   . History of cocaine abuse 1992  . History of tobacco abuse 1999  . Hyperlipidemia   . Hypertension   . Recurrent boils   . Recurrent UTI     Family History  Problem Relation Age of Onset  . Diabetes Other   . Cancer Mother    Past Surgical History:  Procedure Laterality Date  . tummy tuck  03/2010   Social History   Social History Narrative    The patient works at a daycare center, she completed high school, is single     Objective: Vital Signs: LMP 11/22/2012    Physical Exam   Musculoskeletal Exam: ***  CDAI Exam: No CDAI exam completed.    Investigation: Findings:  02/06/17: RF <10, TSH 1.160, ANA negative, Ro -, La -, Hep c antibody negative, aldolase negative, CK 159, smith negative, CRP 10.8, sed rate 49, PTH 38, magnesium 2.0   CBC Latest Ref Rng & Units 11/26/2016 11/09/2015 09/07/2014  WBC 3.4 - 10.8 x10E3/uL 6.0 6.2 7.1  Hemoglobin 11.1 - 15.9 g/dL 12.3 12.1 12.3  Hematocrit 34.0 - 46.6 % 37.5 36.5 39.9  Platelets 150 - 379 x10E3/uL 314 293 355   CMP Latest Ref Rng & Units 01/02/2017 01/02/2017 11/26/2016  Glucose 65 - 99 mg/dL - 100(H) 90  BUN 6 - 24  mg/dL - 13 15  Creatinine 0.57 - 1.00 mg/dL - 0.66 0.58  Sodium 134 - 144 mmol/L - 143 142  Potassium 3.5 - 5.2 mmol/L - 4.4 4.1  Chloride 96 - 106 mmol/L - 104 100  CO2 20 - 29 mmol/L - 22 22  Calcium 8.7 - 10.2 mg/dL 10.0 9.9 10.4(H)  Total Protein 6.0 - 8.5 g/dL - 7.5 7.5  Total Bilirubin 0.0 - 1.2 mg/dL - 0.2 <0.2  Alkaline Phos 39 - 117 IU/L - 120(H) 121(H)  AST 0 - 40 IU/L - 16 16  ALT 0 - 32 IU/L - 14 16     Imaging: No results found.  Speciality Comments: No  specialty comments available.    Procedures:  No procedures performed Allergies: Hydrocodone-acetaminophen; Orange fruit [citrus]; Orange oil; Clindamycin/lincomycin; Meloxicam; and Penicillins   Assessment / Plan:     Visit Diagnoses: Proximal muscle weakness  Elevated sed rate  Unilateral primary osteoarthritis, left knee  Trochanteric bursitis of left hip  Plantar fasciitis of right foot  Plantar fasciitis of left foot  Flexor tenosynovitis of thumb  Eczema, unspecified type  De Quervain's tenosynovitis, left  History of herpes genitalis  History of colonic polyps  History of hyperlipidemia  History of depression    Orders: No orders of the defined types were placed in this encounter.  No orders of the defined types were placed in this encounter.   Face-to-face time spent with patient was *** minutes. 50% of time was spent in counseling and coordination of care.  Follow-Up Instructions: No follow-ups on file.   Ofilia Neas, PA-C  Note - This record has been created using Dragon software.  Chart creation errors have been sought, but may not always  have been located. Such creation errors do not reflect on  the standard of medical care.

## 2017-06-24 ENCOUNTER — Ambulatory Visit (INDEPENDENT_AMBULATORY_CARE_PROVIDER_SITE_OTHER): Payer: BLUE CROSS/BLUE SHIELD | Admitting: Internal Medicine

## 2017-06-24 VITALS — BP 130/68 | HR 80 | Temp 97.7°F | Ht 63.0 in | Wt 204.1 lb

## 2017-06-24 DIAGNOSIS — M5417 Radiculopathy, lumbosacral region: Secondary | ICD-10-CM

## 2017-06-24 DIAGNOSIS — I10 Essential (primary) hypertension: Secondary | ICD-10-CM

## 2017-06-24 DIAGNOSIS — Z79899 Other long term (current) drug therapy: Secondary | ICD-10-CM

## 2017-06-24 MED ORDER — DICLOFENAC SODIUM 1 % TD GEL: 2.0000 g | Freq: Four times a day (QID) | TRANSDERMAL | 1 refills | Status: DC

## 2017-06-24 NOTE — Assessment & Plan Note (Addendum)
BP Readings from Last 3 Encounters:  06/24/17 130/68  06/18/17 (!) 138/91  05/28/17 (!) 153/84   BP normotensive today. On lisinopril 10 mg and amlodipine 10 mg daily.   Plan: -continue current regimen -checking BMP as patient had lisinopril added tp regimen one month ago and had been taking NSAIDs frequently due to lumbosacral radiculopathy - BMP within normal limits Creatinine at baseline 0.6

## 2017-06-24 NOTE — Patient Instructions (Addendum)
Carrie Franco,   It was a pleasure meeting you today.   I want to check your kidney function before prescribing you more ibuprofen. I will call you with the lab results and prescription information.  I have refilled your Voltaren gel.   Continue to use your heating pad as needed.

## 2017-06-24 NOTE — Progress Notes (Signed)
   CC: right leg pain and back pain   HPI:  [name] is a 52 y.o. female with a past medical history listed below here today for follow up of her ED visit on 06/18/2017 for leg pain. The patient states she experienced sudden onset right leg and back pain 6 days ago. The pain starts in her right lumbar back and radiates down the back of her thigh to her calf. Weight bearing causes pain, as well as flexion and extension of the knee. She states her pain was 20/10 the day of her ED visit and now has improved to 7/10. The ibuprofen the ED provider prescribed improved the pain. She states her leg pain was so severe she was taking the ibuprofen every 2-3 hours rather than every 8 hours as prescribed. She denies numbness, tingling, fevers, or chills.   For details of today's visit and the status of her chronic medical issues please refer to the assessment and plan.  Past Medical History:  Diagnosis Date  . Acne   . Allergic rhinitis   . Asthma   . Bacterial vaginitis    recurrent  . Bilateral carpal tunnel syndrome   . Chronic constipation   . External hemorrhoids with complication   . Genital herpes   . High risk sexual behavior   . History of cocaine abuse 1992  . History of tobacco abuse 1999  . Hyperlipidemia   . Hypertension   . Recurrent boils   . Recurrent UTI    Review of Systems:   Review of Systems  Constitutional: Negative for chills and fever.  Musculoskeletal: Positive for back pain. Negative for falls.  Neurological: Negative for tingling, sensory change and focal weakness.    Physical Exam:  There were no vitals filed for this visit. Physical Exam  Constitutional: She is oriented to person, place, and time. She appears well-developed and well-nourished. No distress.  HENT:  Head: Normocephalic and atraumatic.  Eyes: Conjunctivae are normal. No scleral icterus.  Cardiovascular: Normal rate and regular rhythm.  Pulmonary/Chest: Effort normal and breath sounds  normal.  Musculoskeletal: She exhibits no edema.       Right knee: She exhibits decreased range of motion (decreased flexion and extension secondary to pain in posterior thigh). She exhibits no swelling and no effusion.       Lumbar back: She exhibits tenderness. She exhibits no bony tenderness and no swelling.       Back:  Neurological: She is alert and oriented to person, place, and time. She has normal strength. No sensory deficit.  Skin: Skin is warm and dry.    Assessment & Plan:   See Encounters Tab for problem based charting.  Patient discussed with Dr. Evette Doffing

## 2017-06-25 ENCOUNTER — Encounter: Payer: Self-pay | Admitting: Internal Medicine

## 2017-06-25 LAB — BMP8+ANION GAP
ANION GAP: 17 mmol/L (ref 10.0–18.0)
BUN/Creatinine Ratio: 18 (ref 9–23)
BUN: 12 mg/dL (ref 6–24)
CO2: 21 mmol/L (ref 20–29)
Calcium: 9.5 mg/dL (ref 8.7–10.2)
Chloride: 102 mmol/L (ref 96–106)
Creatinine, Ser: 0.67 mg/dL (ref 0.57–1.00)
GFR, EST AFRICAN AMERICAN: 117 mL/min/{1.73_m2} (ref >59)
GFR, EST NON AFRICAN AMERICAN: 101 mL/min/{1.73_m2} (ref >59)
Glucose: 97 mg/dL (ref 65–99)
POTASSIUM: 3.5 mmol/L (ref 3.5–5.2)
Sodium: 140 mmol/L (ref 134–144)

## 2017-06-25 MED ORDER — IBUPROFEN 800 MG PO TABS: 800.0000 mg | ORAL_TABLET | Freq: Three times a day (TID) | ORAL | 0 refills | Status: DC | PRN

## 2017-06-25 NOTE — Progress Notes (Signed)
[facility] Medicine Clinic Attending  Case discussed with Dr. LaCroce at the time of the visit.  We reviewed the resident's history and exam and pertinent patient test results.  I agree with the assessment, diagnosis, and plan of care documented in the resident's note.  

## 2017-06-25 NOTE — Assessment & Plan Note (Addendum)
Patient presenting with right sided lumbosacral radiculopathy. No inciting event or trauma. Root compression likely secondary to degenerative lumbosacral osteoarthritis.  No red flags that warrant imaging at this time.   On exam, patient has decreased flexion and extension at the knee secondary to pain. No focal neurological deficits. Sensation in tact.   Plan: -Ibuprofen 800 mg TID for additional 7 days - repeat BMP with normal creatinine -supportive treatment with heating pads and rest

## 2017-06-26 ENCOUNTER — Telehealth: Payer: Self-pay | Admitting: *Deleted

## 2017-06-26 NOTE — Telephone Encounter (Addendum)
Information was sent through CoverMyMeds for PA for Voltaren Gel.  Awaiting decision within 72 hours.  Sander Nephew, RN 06/26/2017. PA for Diclofenac Gel was denied.   The request did not meet the definition pf Medical Necessity found in the member's benefit booklet.  Sander Nephew, RN 06/27/2017 11:34 AM

## 2017-07-04 ENCOUNTER — Ambulatory Visit: Payer: BLUE CROSS/BLUE SHIELD | Admitting: Rheumatology

## 2017-07-04 NOTE — Addendum Note (Signed)
Addended by: Hulan Fray on: 07/04/2017 05:53 PM   Modules accepted: Orders

## 2017-07-10 ENCOUNTER — Encounter: Payer: Self-pay | Admitting: Internal Medicine

## 2017-07-10 ENCOUNTER — Encounter: Payer: BLUE CROSS/BLUE SHIELD | Admitting: Internal Medicine

## 2017-07-17 ENCOUNTER — Other Ambulatory Visit: Payer: Self-pay

## 2017-07-17 ENCOUNTER — Encounter (HOSPITAL_COMMUNITY): Payer: Self-pay | Admitting: Emergency Medicine

## 2017-07-17 ENCOUNTER — Emergency Department (HOSPITAL_COMMUNITY)
Admission: EM | Admit: 2017-07-17 | Discharge: 2017-07-18 | Disposition: A | Discharge status: Home / Self Care | Payer: BLUE CROSS/BLUE SHIELD | Attending: Emergency Medicine | Admitting: Emergency Medicine

## 2017-07-17 DIAGNOSIS — J45909 Unspecified asthma, uncomplicated: Secondary | ICD-10-CM | POA: Diagnosis not present

## 2017-07-17 DIAGNOSIS — Z79899 Other long term (current) drug therapy: Secondary | ICD-10-CM | POA: Insufficient documentation

## 2017-07-17 DIAGNOSIS — T25291A Burn of second degree of multiple sites of right ankle and foot, initial encounter: Secondary | ICD-10-CM | POA: Insufficient documentation

## 2017-07-17 DIAGNOSIS — Y990 Civilian activity done for income or pay: Secondary | ICD-10-CM | POA: Insufficient documentation

## 2017-07-17 DIAGNOSIS — X102XXA Contact with fats and cooking oils, initial encounter: Secondary | ICD-10-CM | POA: Diagnosis not present

## 2017-07-17 DIAGNOSIS — Y93G9 Activity, other involving cooking and grilling: Secondary | ICD-10-CM | POA: Insufficient documentation

## 2017-07-17 DIAGNOSIS — Y929 Unspecified place or not applicable: Secondary | ICD-10-CM | POA: Diagnosis not present

## 2017-07-17 DIAGNOSIS — T300 Burn of unspecified body region, unspecified degree: Secondary | ICD-10-CM

## 2017-07-17 DIAGNOSIS — T310 Burns involving less than 10% of body surface: Secondary | ICD-10-CM | POA: Diagnosis not present

## 2017-07-17 DIAGNOSIS — Z87891 Personal history of nicotine dependence: Secondary | ICD-10-CM | POA: Insufficient documentation

## 2017-07-17 DIAGNOSIS — T25292A Burn of second degree of multiple sites of left ankle and foot, initial encounter: Secondary | ICD-10-CM | POA: Diagnosis not present

## 2017-07-17 MED ORDER — HYDROMORPHONE HCL 1 MG/ML IJ SOLN
1.0000 mg | Freq: Once | INTRAMUSCULAR | Status: AC
  Administered 2017-07-17: 1 mg via INTRAVENOUS
  Filled 2017-07-17: qty 1

## 2017-07-17 MED ORDER — HYDROMORPHONE HCL 1 MG/ML IJ SOLN
INTRAMUSCULAR | Status: AC
  Administered 2017-07-17: 1 mg
  Filled 2017-07-17: qty 1
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
## 2017-07-17 NOTE — ED Notes (Addendum)
ED Provider at bedside. He applied cool wash cloths to BLE.
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
## 2017-07-18 MED ORDER — OXYCODONE-ACETAMINOPHEN 5-325 MG PO TABS
2.0000 | ORAL_TABLET | Freq: Once | ORAL | Status: AC
Start: 2017-07-18 — End: 2017-07-18
  Administered 2017-07-18: 2 via ORAL
  Filled 2017-07-18: qty 2

## 2017-07-18 MED ORDER — SILVER SULFADIAZINE 1 % EX CREA
TOPICAL_CREAM | Freq: Once | CUTANEOUS | Status: AC
  Administered 2017-07-18: 02:00:00 via TOPICAL
  Filled 2017-07-18: qty 85

## 2017-07-18 MED ORDER — OXYCODONE-ACETAMINOPHEN 5-325 MG PO TABS: 1.0000 | ORAL_TABLET | Freq: Four times a day (QID) | ORAL | 0 refills | Status: DC | PRN

## 2017-07-18 NOTE — ED Notes (Signed)
Patient Alert and oriented to baseline. Stable and ambulatory to baseline. Patient verbalized understanding of the discharge instructions.  Patient belongings were taken by the patient.   

## 2017-07-18 NOTE — Discharge Instructions (Addendum)
Percocet as prescribed as needed for pain.  Local wound care with Silvadene and dressing changes once daily.  Follow-up with plastic surgery in the next 3 to 4 days.  The contact information for Dr. Tobias Alexander has been provided in this discharge summary for you to call and make these arrangements.

## 2017-07-29 ENCOUNTER — Other Ambulatory Visit: Payer: Self-pay

## 2017-07-29 ENCOUNTER — Ambulatory Visit (INDEPENDENT_AMBULATORY_CARE_PROVIDER_SITE_OTHER): Payer: BLUE CROSS/BLUE SHIELD | Admitting: Internal Medicine

## 2017-07-29 ENCOUNTER — Other Ambulatory Visit (HOSPITAL_COMMUNITY)
Admission: RE | Admit: 2017-07-29 | Discharge: 2017-07-29 | Disposition: A | Discharge status: Home / Self Care | Payer: BLUE CROSS/BLUE SHIELD | Source: Ambulatory Visit | Attending: Internal Medicine | Admitting: Internal Medicine

## 2017-07-29 VITALS — BP 127/68 | HR 94 | Temp 99.5°F | Ht 63.0 in | Wt 197.5 lb

## 2017-07-29 DIAGNOSIS — N898 Other specified noninflammatory disorders of vagina: Secondary | ICD-10-CM | POA: Insufficient documentation

## 2017-07-29 MED ORDER — DOXYCYCLINE HYCLATE 100 MG PO TABS: 100.0000 mg | ORAL_TABLET | Freq: Two times a day (BID) | ORAL | 0 refills | Status: AC

## 2017-07-29 NOTE — Patient Instructions (Addendum)
It was great seeing you today!!  I will call you with the results of your tests as soon as they come in.   For now I am going to treat you with a medicine called Doxycycline which you will take twice daily for the next 7 days.   If you notice any other symptoms, please give me a call.   Please see me back in 1-2 months to follow-up

## 2017-07-30 DIAGNOSIS — T24239A Burn of second degree of unspecified lower leg, initial encounter: Secondary | ICD-10-CM

## 2017-07-30 HISTORY — DX: Burn of second degree of unspecified lower leg, initial encounter: T24.239A

## 2017-07-30 LAB — CERVICOVAGINAL ANCILLARY ONLY
BACTERIAL VAGINITIS: POSITIVE — AB
CANDIDA VAGINITIS: NEGATIVE
Chlamydia: NEGATIVE
Neisseria Gonorrhea: NEGATIVE
Trichomonas: NEGATIVE

## 2017-07-30 NOTE — Progress Notes (Signed)
Internal Medicine Clinic Attending  Case discussed with Dr. Molt at the time of the visit.  We reviewed the resident's history and exam and pertinent patient test results.  I agree with the assessment, diagnosis, and plan of care documented in the resident's note. 

## 2017-07-30 NOTE — Progress Notes (Signed)
   CC: Vaginal discharge, odor  HPI:  [name] is a 52 y.o. F with medial history as outlined below who presents today for evaluation of a 2-week history of vaginal discharge and odor.   For details regarding today's visit and the status of their chronic medical issues, please refer to the assessment and plan.  Past Medical History:  Diagnosis Date  . Acne   . Allergic rhinitis   . Asthma   . Bacterial vaginitis    recurrent  . Bilateral carpal tunnel syndrome   . Chronic constipation   . External hemorrhoids with complication   . Genital herpes   . High risk sexual behavior   . History of cocaine abuse 1992  . History of tobacco abuse 1999  . Hyperlipidemia   . Hypertension   . Recurrent boils   . Recurrent UTI    Review of Systems:   General: Denies fevers, chills, weight loss, rash, joint pain HEENT: Denies changes in vision, sore throat, ulcers Cardiac: Denies CP, SOB Abd: Denies abdominal pain, nausea, vomiting or changes in bowels  Physical Exam: General: Alert, in no acute distress. Pleasant and conversant HEENT: No icterus, injection or ptosis. No hoarseness or dysarthria. No oropharyngeal exudate. Cardiac: RRR Pulmonary: CTA BL with normal WOB on RA. Able to speak in complete sentences Abd: Soft, not distended. Some lower abdominal discomfort on palpation. +bs GU: vulva and groin without erythema, rash or vesicular eruption. Speculum exam with yellow discharged from cervical os. No odor appreciated. Cervix friable with punctate erythematous lesions. Cervix was mobile but painful.   Vitals:   07/29/17 1609  BP: 127/68  Pulse: 94  Temp: 99.5 F (37.5 C)  TempSrc: Oral  SpO2: 98%  Weight: 197 lb 8 oz (89.6 kg)  Height: 5\' 3"  (1.6 m)   Body mass index is 34.99 kg/m.  Assessment & Plan:   See Encounters Tab for problem based charting.  Patient discussed with Dr. Daryll Drown

## 2017-07-30 NOTE — Assessment & Plan Note (Signed)
Assessment:  Ms. Savell is here with 2-wk history of vaginal discharge, pain and fishy odor which has increased over the past week. Reports monogamous relationship with female partner for the past 6 years who she suspects has been with other female partners due to behavior changes lately. She denied any fevers, chills, rash or joint pain.   Pelvic exam shows yellow discharge about the cervix which was cultured. Cervix was mobile, painful and friable with punctate erythematous lesions (resembling "strawberry cervix"). PAP was collected as well.      Plan: Exam findings most consistent with Trichomoniasis although Chlamydia could also have similar discharge. Culture and PAP obtained which have been sent for evaluation. Treating empirically for Chlamydia and Trichomoniasis with Doxycycline 100mg  BID x 7 days. Will update patient on results as they arrive. She declined HIV screen today.

## 2017-07-31 ENCOUNTER — Other Ambulatory Visit: Payer: Self-pay | Admitting: Internal Medicine

## 2017-07-31 LAB — CYTOLOGY - PAP
ADEQUACY: ABSENT
Diagnosis: NEGATIVE
HPV: NOT DETECTED

## 2017-07-31 MED ORDER — METRONIDAZOLE 500 MG PO TABS: 500.0000 mg | ORAL_TABLET | Freq: Two times a day (BID) | ORAL | 0 refills | Status: AC

## 2017-07-31 NOTE — Progress Notes (Signed)
flagyl

## 2017-08-02 ENCOUNTER — Telehealth: Payer: Self-pay | Admitting: *Deleted

## 2017-08-02 ENCOUNTER — Other Ambulatory Visit: Payer: Self-pay | Admitting: Internal Medicine

## 2017-08-02 MED ORDER — OXYCODONE-ACETAMINOPHEN 5-325 MG PO TABS: 1.0000 | ORAL_TABLET | Freq: Four times a day (QID) | ORAL | 0 refills | Status: DC | PRN

## 2017-08-02 NOTE — Telephone Encounter (Signed)
Patient notified Rx for pain med has been sent. Very appreciative. Hubbard Hartshorn, RN, BSN
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
## 2017-08-03 ENCOUNTER — Other Ambulatory Visit: Payer: Self-pay | Admitting: Internal Medicine

## 2017-08-03 DIAGNOSIS — K219 Gastro-esophageal reflux disease without esophagitis: Secondary | ICD-10-CM

## 2017-08-05 ENCOUNTER — Other Ambulatory Visit: Payer: Self-pay | Admitting: Internal Medicine

## 2017-09-10 ENCOUNTER — Other Ambulatory Visit: Payer: Self-pay | Admitting: Internal Medicine

## 2017-09-30 ENCOUNTER — Other Ambulatory Visit: Payer: Self-pay | Admitting: Internal Medicine

## 2017-09-30 DIAGNOSIS — G8929 Other chronic pain: Secondary | ICD-10-CM

## 2017-09-30 DIAGNOSIS — M542 Cervicalgia: Principal | ICD-10-CM

## 2017-11-14 ENCOUNTER — Ambulatory Visit (HOSPITAL_COMMUNITY)
Admission: EM | Admit: 2017-11-14 | Discharge: 2017-11-14 | Disposition: A | Discharge status: Home / Self Care | Payer: BLUE CROSS/BLUE SHIELD | Attending: Family Medicine | Admitting: Family Medicine

## 2017-11-14 ENCOUNTER — Other Ambulatory Visit: Payer: Self-pay

## 2017-11-14 ENCOUNTER — Encounter (HOSPITAL_COMMUNITY): Payer: Self-pay | Admitting: *Deleted

## 2017-11-14 ENCOUNTER — Ambulatory Visit: Payer: BLUE CROSS/BLUE SHIELD

## 2017-11-14 ENCOUNTER — Encounter: Payer: Self-pay | Admitting: Internal Medicine

## 2017-11-14 DIAGNOSIS — Z888 Allergy status to other drugs, medicaments and biological substances status: Secondary | ICD-10-CM | POA: Insufficient documentation

## 2017-11-14 DIAGNOSIS — Z881 Allergy status to other antibiotic agents status: Secondary | ICD-10-CM | POA: Diagnosis not present

## 2017-11-14 DIAGNOSIS — Z791 Long term (current) use of non-steroidal anti-inflammatories (NSAID): Secondary | ICD-10-CM | POA: Insufficient documentation

## 2017-11-14 DIAGNOSIS — N760 Acute vaginitis: Secondary | ICD-10-CM | POA: Diagnosis not present

## 2017-11-14 DIAGNOSIS — Z880 Allergy status to penicillin: Secondary | ICD-10-CM | POA: Insufficient documentation

## 2017-11-14 DIAGNOSIS — Z87891 Personal history of nicotine dependence: Secondary | ICD-10-CM | POA: Insufficient documentation

## 2017-11-14 DIAGNOSIS — N898 Other specified noninflammatory disorders of vagina: Secondary | ICD-10-CM | POA: Diagnosis present

## 2017-11-14 DIAGNOSIS — Z885 Allergy status to narcotic agent status: Secondary | ICD-10-CM | POA: Diagnosis not present

## 2017-11-14 DIAGNOSIS — B9689 Other specified bacterial agents as the cause of diseases classified elsewhere: Secondary | ICD-10-CM | POA: Insufficient documentation

## 2017-11-14 DIAGNOSIS — Z79899 Other long term (current) drug therapy: Secondary | ICD-10-CM | POA: Insufficient documentation

## 2017-11-14 MED ORDER — METRONIDAZOLE 500 MG PO TABS: 500.0000 mg | ORAL_TABLET | Freq: Two times a day (BID) | ORAL | 0 refills | Status: DC

## 2017-11-14 MED ORDER — FLUCONAZOLE 150 MG PO TABS: 150.0000 mg | ORAL_TABLET | Freq: Every day | ORAL | 0 refills | Status: DC
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
## 2017-11-14 NOTE — Discharge Instructions (Addendum)
We will go ahead and treat you for BV and yeast today Swab sent for testing We will call you with any positive results

## 2017-11-15 LAB — CERVICOVAGINAL ANCILLARY ONLY
Bacterial vaginitis: POSITIVE — AB
CHLAMYDIA, DNA PROBE: NEGATIVE
Candida vaginitis: NEGATIVE
Neisseria Gonorrhea: NEGATIVE
Trichomonas: NEGATIVE

## 2017-11-20 ENCOUNTER — Encounter: Payer: Self-pay | Admitting: Internal Medicine

## 2017-11-20 ENCOUNTER — Other Ambulatory Visit: Payer: Self-pay | Admitting: Internal Medicine

## 2017-11-20 MED ORDER — LISINOPRIL 10 MG PO TABS: 10.0000 mg | ORAL_TABLET | Freq: Every day | ORAL | 0 refills | Status: DC

## 2017-12-02 ENCOUNTER — Other Ambulatory Visit: Payer: Self-pay | Admitting: Internal Medicine

## 2017-12-02 DIAGNOSIS — Z1231 Encounter for screening mammogram for malignant neoplasm of breast: Secondary | ICD-10-CM

## 2017-12-03 ENCOUNTER — Ambulatory Visit (INDEPENDENT_AMBULATORY_CARE_PROVIDER_SITE_OTHER): Payer: BLUE CROSS/BLUE SHIELD | Admitting: Internal Medicine

## 2017-12-03 ENCOUNTER — Other Ambulatory Visit: Payer: Self-pay

## 2017-12-03 VITALS — BP 149/89 | HR 76 | Temp 97.6°F | Ht 63.0 in | Wt 190.2 lb

## 2017-12-03 DIAGNOSIS — L602 Onychogryphosis: Secondary | ICD-10-CM

## 2017-12-03 DIAGNOSIS — I10 Essential (primary) hypertension: Secondary | ICD-10-CM | POA: Diagnosis not present

## 2017-12-03 DIAGNOSIS — Z79899 Other long term (current) drug therapy: Secondary | ICD-10-CM

## 2017-12-03 DIAGNOSIS — Z87891 Personal history of nicotine dependence: Secondary | ICD-10-CM

## 2017-12-03 DIAGNOSIS — L309 Dermatitis, unspecified: Secondary | ICD-10-CM

## 2017-12-03 DIAGNOSIS — B351 Tinea unguium: Secondary | ICD-10-CM

## 2017-12-03 DIAGNOSIS — Z87828 Personal history of other (healed) physical injury and trauma: Secondary | ICD-10-CM

## 2017-12-03 MED ORDER — LISINOPRIL 10 MG PO TABS: 10.0000 mg | ORAL_TABLET | Freq: Every day | ORAL | 0 refills | Status: DC

## 2017-12-03 MED ORDER — DICLOFENAC SODIUM 1 % TD GEL: 2.0000 g | Freq: Four times a day (QID) | TRANSDERMAL | 1 refills | Status: DC

## 2017-12-03 MED ORDER — TRIAMCINOLONE ACETONIDE 0.1 % EX OINT: TOPICAL_OINTMENT | Freq: Two times a day (BID) | CUTANEOUS | Status: DC

## 2017-12-03 MED ORDER — TRIAMCINOLONE ACETONIDE 0.1 % EX OINT: 1.0000 "application " | TOPICAL_OINTMENT | Freq: Two times a day (BID) | CUTANEOUS | 0 refills | Status: DC

## 2017-12-03 MED ORDER — AMLODIPINE BESYLATE 10 MG PO TABS: 10.0000 mg | ORAL_TABLET | Freq: Every day | ORAL | 2 refills | Status: DC

## 2017-12-03 MED ORDER — TERBINAFINE HCL 250 MG PO TABS: 250.0000 mg | ORAL_TABLET | Freq: Every day | ORAL | 0 refills | Status: AC

## 2017-12-03 NOTE — Patient Instructions (Signed)
Thank you for allowing [place] to provide your care today. Today we discussed your toenail issue. It looks like this is a fungal infection. Please start taking terbinafine 250 mg tablets once daily for 12 weeks, please follow up in 6 weeks so that we can see how your toenails are doing.   Please follow-up in 6 weeks.    Should you have any questions or concerns please call the internal medicine clinic at [phone].
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
## 2017-12-04 ENCOUNTER — Telehealth: Payer: Self-pay | Admitting: *Deleted

## 2017-12-04 DIAGNOSIS — B351 Tinea unguium: Secondary | ICD-10-CM | POA: Insufficient documentation

## 2017-12-04 NOTE — Assessment & Plan Note (Addendum)
Currently on amlodipine 10 mg.  Blood pressure today was 147/75.  We will continue the regimen for now however she will likely need to increase this on her next visit.

## 2017-12-04 NOTE — Assessment & Plan Note (Signed)
She reports that she has been having some more dry skin on side of, and is requesting refill of triamcinolone ointment. -Triamcinolone 0.1% ointment
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
## 2017-12-04 NOTE — Telephone Encounter (Signed)
Information was sent to CoverMyMeds for [place] for Diclofenac Gel 1%,  Approved 12/04/2017 thru 12/02/2020. [facility] was notified at [phone].  Sander Nephew, RN 12/04/2017 9:58 AM.

## 2017-12-11 NOTE — Progress Notes (Signed)
Internal Medicine Clinic Attending  I saw and evaluated the patient.  I personally confirmed the key portions of the history and exam documented by Dr. Krienke and I reviewed pertinent patient test results.  The assessment, diagnosis, and plan were formulated together and I agree with the documentation in the resident's note.    

## 2018-01-09 ENCOUNTER — Other Ambulatory Visit: Payer: Self-pay | Admitting: Internal Medicine

## 2018-01-09 ENCOUNTER — Ambulatory Visit: Payer: BLUE CROSS/BLUE SHIELD

## 2018-01-09 DIAGNOSIS — Z8619 Personal history of other infectious and parasitic diseases: Secondary | ICD-10-CM

## 2018-01-22 ENCOUNTER — Other Ambulatory Visit (HOSPITAL_COMMUNITY)
Admission: RE | Admit: 2018-01-22 | Discharge: 2018-01-22 | Disposition: A | Discharge status: Home / Self Care | Payer: BLUE CROSS/BLUE SHIELD | Source: Ambulatory Visit | Attending: Internal Medicine | Admitting: Internal Medicine

## 2018-01-22 ENCOUNTER — Ambulatory Visit (INDEPENDENT_AMBULATORY_CARE_PROVIDER_SITE_OTHER): Payer: BLUE CROSS/BLUE SHIELD | Admitting: Internal Medicine

## 2018-01-22 ENCOUNTER — Encounter: Payer: Self-pay | Admitting: Internal Medicine

## 2018-01-22 ENCOUNTER — Other Ambulatory Visit: Payer: Self-pay

## 2018-01-22 VITALS — BP 141/84 | HR 100 | Temp 98.0°F | Ht 64.0 in | Wt 189.4 lb

## 2018-01-22 DIAGNOSIS — Z113 Encounter for screening for infections with a predominantly sexual mode of transmission: Secondary | ICD-10-CM | POA: Diagnosis not present

## 2018-01-22 NOTE — Progress Notes (Signed)
Internal Medicine Clinic Attending  Case discussed with Dr. Masoudi  at the time of the visit.  We reviewed the resident's history and exam and pertinent patient test results.  I agree with the assessment, diagnosis, and plan of care documented in the resident's note.  

## 2018-01-22 NOTE — Assessment & Plan Note (Signed)
Ms. Slinker, came into the clinic today, requesting for STI screening.  She mentions that she and her husband are going to go back together after 7 months of separations and like to do STI screening test before that.  They each had one partner while separated.  Ms. Comacho, is asymptomatic and denies any vaginal discharge, rash, itching.  She mentions that she used condom with her recent partner.   -HIV antibody -RPR -Urine test for Chlamydia, gonorrhea, trichomonas

## 2018-01-22 NOTE — Progress Notes (Signed)
   CC: STI screening   HPI:  [name] is a 53 y.o. female with PMHx listed below, came to the clinic asking for routine STI test.  Please see problem based charting for further details and assessment and plan.   Past Medical History:  Diagnosis Date  . Acne   . Allergic rhinitis   . Asthma   . Bacterial vaginitis    recurrent  . Bilateral carpal tunnel syndrome   . Chronic constipation   . External hemorrhoids with complication   . Genital herpes   . High risk sexual behavior   . History of cocaine abuse ([place]) 1992  . History of tobacco abuse 1999  . Hyperlipidemia   . Hypertension   . Recurrent boils   . Recurrent UTI    Social Hx: Does not smoke No alcohol use No illicit drug use Has had 1 sexual partner recently and after seperation. Used condom  Family history: Cancer      mother  Review of Systems:  Review of Systems  Constitutional: Negative for chills and fever.  HENT: Negative for congestion.   Respiratory: Negative for cough and shortness of breath.   Cardiovascular: Negative for chest pain and leg swelling.  Gastrointestinal: Negative for abdominal pain, diarrhea, nausea and vomiting.  Genitourinary: Negative for dysuria, flank pain, frequency, hematuria and urgency.  Skin: Negative for itching and rash.  Neurological: Negative for headaches.     Physical Exam:  Vitals:   01/22/18 1354  BP: (!) 141/84  Pulse: 100  Temp: 98 F (36.7 C)  TempSrc: Oral  SpO2: 100%  Weight: 189 lb 6.4 oz (85.9 kg)  Height: 5\' 4"  (1.626 m)   Physical Exam Vitals signs reviewed.  Constitutional:      General: She is not in acute distress.    Appearance: Normal appearance. She is not ill-appearing.  HENT:     Head: Normocephalic and atraumatic.  Eyes:     Extraocular Movements: Extraocular movements intact.  Cardiovascular:     Rate and Rhythm: Normal rate and regular rhythm.     Pulses: Normal pulses.     Heart sounds: No murmur.  Pulmonary:   Effort: Pulmonary effort is normal.     Breath sounds: Normal breath sounds. No wheezing or rales.  Abdominal:     General: Bowel sounds are normal.     Palpations: Abdomen is soft.     Tenderness: There is no abdominal tenderness.  Skin:    General: Skin is warm and dry.  Neurological:     Mental Status: She is alert and oriented to person, place, and time. Mental status is at baseline.  Psychiatric:        Mood and Affect: Mood normal.        Behavior: Behavior normal.     Assessment & Plan:   See Encounters Tab for problem based charting.  Patient discussed with Dr. Lynnae January

## 2018-01-22 NOTE — Patient Instructions (Addendum)
Thank you for allowing [place] to provide your care today. You came in to the clinic today for routine STI screening. I order blood work and urine test for that.  I will call if any are abnormal.    Today we did not make any changes to your medications.    Please follow-up in clinic as needed.   Should you have any questions or concerns, please call the internal medicine clinic at [phone].    Thanks

## 2018-01-23 LAB — URINE CYTOLOGY ANCILLARY ONLY
CHLAMYDIA, DNA PROBE: NEGATIVE
Neisseria Gonorrhea: NEGATIVE
Trichomonas: NEGATIVE

## 2018-01-23 LAB — HIV ANTIBODY (ROUTINE TESTING W REFLEX): HIV SCREEN 4TH GENERATION: NONREACTIVE

## 2018-01-23 LAB — RPR: RPR Ser Ql: NONREACTIVE

## 2018-01-27 ENCOUNTER — Ambulatory Visit (INDEPENDENT_AMBULATORY_CARE_PROVIDER_SITE_OTHER): Payer: BLUE CROSS/BLUE SHIELD | Admitting: Orthopedic Surgery

## 2018-01-27 ENCOUNTER — Ambulatory Visit (INDEPENDENT_AMBULATORY_CARE_PROVIDER_SITE_OTHER): Payer: Self-pay

## 2018-01-27 ENCOUNTER — Encounter (INDEPENDENT_AMBULATORY_CARE_PROVIDER_SITE_OTHER): Payer: Self-pay | Admitting: Orthopedic Surgery

## 2018-01-27 VITALS — Ht 63.0 in | Wt 189.0 lb

## 2018-01-27 DIAGNOSIS — M25562 Pain in left knee: Secondary | ICD-10-CM | POA: Diagnosis not present

## 2018-01-27 DIAGNOSIS — G8929 Other chronic pain: Secondary | ICD-10-CM

## 2018-01-27 DIAGNOSIS — M25561 Pain in right knee: Secondary | ICD-10-CM | POA: Diagnosis not present
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
## 2018-01-31 ENCOUNTER — Telehealth: Payer: Self-pay | Admitting: Internal Medicine

## 2018-01-31 NOTE — Telephone Encounter (Signed)
Called Ms. Grable and informed her about her lab results and that STD test came back negative.

## 2018-02-07 ENCOUNTER — Other Ambulatory Visit: Payer: Self-pay | Admitting: *Deleted

## 2018-02-07 DIAGNOSIS — L309 Dermatitis, unspecified: Secondary | ICD-10-CM

## 2018-02-07 MED ORDER — TRIAMCINOLONE ACETONIDE 0.1 % EX OINT: 1.0000 "application " | TOPICAL_OINTMENT | Freq: Two times a day (BID) | CUTANEOUS | 0 refills | Status: DC

## 2018-02-11 ENCOUNTER — Other Ambulatory Visit: Payer: Self-pay | Admitting: *Deleted

## 2018-02-11 DIAGNOSIS — G8929 Other chronic pain: Secondary | ICD-10-CM

## 2018-02-11 DIAGNOSIS — M542 Cervicalgia: Principal | ICD-10-CM

## 2018-02-18 ENCOUNTER — Other Ambulatory Visit: Payer: Self-pay | Admitting: Internal Medicine

## 2018-02-18 ENCOUNTER — Telehealth (INDEPENDENT_AMBULATORY_CARE_PROVIDER_SITE_OTHER): Payer: Self-pay | Admitting: Orthopedic Surgery

## 2018-02-18 DIAGNOSIS — M542 Cervicalgia: Principal | ICD-10-CM

## 2018-02-18 DIAGNOSIS — G8929 Other chronic pain: Secondary | ICD-10-CM

## 2018-02-18 NOTE — Telephone Encounter (Signed)
See below, please advise.

## 2018-02-18 NOTE — Telephone Encounter (Signed)
Needs refills on cyclobenzaprine (FLEXERIL) 10 MG tablet  At [place], [place] ;pt contact [phone]

## 2018-02-18 NOTE — Telephone Encounter (Signed)
Patient called stating that her PCP advised her to get a note from Dr. Marlou Sa stating that she is not able to be by herself anymore due to frequent falling.  CB#[phone].  Thank you.

## 2018-02-19 ENCOUNTER — Encounter (INDEPENDENT_AMBULATORY_CARE_PROVIDER_SITE_OTHER): Payer: Self-pay

## 2018-02-19 NOTE — Telephone Encounter (Signed)
Ok for note that says pt has knee oa and has frequent falls despite conseervative treatment

## 2018-02-19 NOTE — Telephone Encounter (Signed)
Patient aware of the below message  

## 2018-02-22 MED ORDER — CYCLOBENZAPRINE HCL 10 MG PO TABS: 10.0000 mg | ORAL_TABLET | Freq: Every day | ORAL | 0 refills | Status: DC

## 2018-02-25 ENCOUNTER — Encounter: Payer: Self-pay | Admitting: Internal Medicine

## 2018-02-25 ENCOUNTER — Ambulatory Visit (INDEPENDENT_AMBULATORY_CARE_PROVIDER_SITE_OTHER): Payer: BLUE CROSS/BLUE SHIELD | Admitting: Internal Medicine

## 2018-02-25 ENCOUNTER — Other Ambulatory Visit: Payer: Self-pay

## 2018-02-25 VITALS — BP 148/83 | HR 89 | Temp 98.5°F | Ht 64.0 in | Wt 199.6 lb

## 2018-02-25 DIAGNOSIS — R51 Headache: Secondary | ICD-10-CM

## 2018-02-25 DIAGNOSIS — I10 Essential (primary) hypertension: Secondary | ICD-10-CM | POA: Diagnosis not present

## 2018-02-25 DIAGNOSIS — Z8669 Personal history of other diseases of the nervous system and sense organs: Secondary | ICD-10-CM

## 2018-02-25 DIAGNOSIS — J45909 Unspecified asthma, uncomplicated: Secondary | ICD-10-CM

## 2018-02-25 DIAGNOSIS — K219 Gastro-esophageal reflux disease without esophagitis: Secondary | ICD-10-CM

## 2018-02-25 DIAGNOSIS — R040 Epistaxis: Secondary | ICD-10-CM | POA: Diagnosis not present

## 2018-02-25 DIAGNOSIS — R519 Headache, unspecified: Secondary | ICD-10-CM

## 2018-02-25 DIAGNOSIS — M5417 Radiculopathy, lumbosacral region: Secondary | ICD-10-CM

## 2018-02-25 DIAGNOSIS — Z79899 Other long term (current) drug therapy: Secondary | ICD-10-CM

## 2018-02-25 DIAGNOSIS — M170 Bilateral primary osteoarthritis of knee: Secondary | ICD-10-CM

## 2018-02-25 NOTE — Progress Notes (Signed)
CC: Headache  HPI:  Carrie Franco is a 53 y.o. female with hypertension, asthma, GERD, history of trigeminal neuralgia of the right side of face, and radiculopathy of lumbosacral region resents with headaches.  [name] reports a four-day history of severe headache.  She has pain to both the left and right side of her temples (worse on right-side) as well as the top of her head.  The pain has been constant since onset but she has gotten some mild relief from use of ibuprofen and Tylenol.  She reports that the headaches wake her up from sleep.  Her headache is worsened with bending forward.  She does believe that it is a similar headache to when she had trigeminal neuralgia several years ago but more severe today.  She endorses phonophobia but no photophobia.  She denies fevers, chills, myalgias, weight loss, confusion, impaired consciousness, changes in vision, jaw claudication.  She has had several nosebleeds in the past month but states that this usually occurs with her seasonal allergies.  She denies abdominal pain, vomiting, nausea, cough, congestion.  She took all of her occasions today including amlodipine 10 mg QD and lisinopril 10 mg daily.  Past Medical History:  Diagnosis Date  . Acne   . Allergic rhinitis   . Asthma   . Bacterial vaginitis    recurrent  . Bilateral carpal tunnel syndrome   . Chronic constipation   . External hemorrhoids with complication   . Genital herpes   . High risk sexual behavior   . History of cocaine abuse ([place]) 1992  . History of tobacco abuse 1999  . Hyperlipidemia   . Hypertension   . Recurrent boils   . Recurrent UTI    Review of Systems:   Review of Systems  Constitutional: Negative for chills, fever and weight loss.  HENT: Positive for nosebleeds. Negative for congestion and sore throat.   Respiratory: Negative for cough and shortness of breath.   Gastrointestinal: Negative for nausea and vomiting.  Genitourinary: Negative for  dysuria, frequency, hematuria and urgency.  Skin: Negative for itching and rash.  Neurological: Positive for weakness and headaches.  All other systems reviewed and are negative.   Physical Exam:  Vitals:   02/25/18 1532  BP: (!) 148/83  Pulse: 89  Temp: 98.5 F (36.9 C)  TempSrc: Oral  SpO2: 99%  Weight: 199 lb 9.6 oz (90.5 kg)  Height: 5\' 4"  (1.626 m)   Physical Exam Vitals signs and nursing note reviewed.  Constitutional:      Appearance: Normal appearance.  HENT:     Head: Normocephalic and atraumatic.     Comments: Tenderness to palpation of bilateral temples and forehead. Cardiovascular:     Rate and Rhythm: Normal rate and regular rhythm.  Pulmonary:     Effort: Pulmonary effort is normal. No respiratory distress.     Breath sounds: Normal breath sounds.  Musculoskeletal:     Right lower leg: No edema.     Left lower leg: No edema.     Comments: Tenderness to palpation of the hands bilaterally, tenderness to palpation of the knees bilaterally.  She has 2 knee braces on for her osteoarthritis.  Skin:    General: Skin is warm and dry.  Neurological:     General: No focal deficit present.     Mental Status: She is alert and oriented to person, place, and time. Mental status is at baseline.  Psychiatric:        Mood  and Affect: Mood normal.        Behavior: Behavior normal.     Assessment & Plan:   See Encounters Tab for problem based charting.  Patient discussed with Dr. Evette Doffing

## 2018-02-25 NOTE — Patient Instructions (Signed)
Stop ibuprofen use completely. Please use only tylenol 325 to 650 mg every 4 to 6 hours as needed.  I will call you with the results of your lab work.

## 2018-02-26 DIAGNOSIS — R519 Headache, unspecified: Secondary | ICD-10-CM

## 2018-02-26 DIAGNOSIS — R51 Headache: Secondary | ICD-10-CM

## 2018-02-26 HISTORY — DX: Headache, unspecified: R51.9

## 2018-02-26 LAB — SEDIMENTATION RATE: Sed Rate: 45 mm/hr — ABNORMAL HIGH (ref 0–40)

## 2018-02-26 NOTE — Progress Notes (Signed)
Internal Medicine Clinic Attending  Case discussed with Dr. Prince at the time of the visit.  We reviewed the resident's history and exam and pertinent patient test results.  I agree with the assessment, diagnosis, and plan of care documented in the resident's note.   

## 2018-02-26 NOTE — Assessment & Plan Note (Addendum)
Ms. Balzarini presents with 4 days of intractable headache.  She has multiple danger features including age >50, headache waking her up at night, and postural aggravation which would all suggest a secondary headache source such as an intracranial malignancy.  I will order an MRI with and without contrast to evaluate for this.  Other potential diagnoses include a medication use headaches secondary to ibuprofen use.  I have asked her to discontinue ibuprofen use and continue Tylenol as needed. Giant cell arteritis should be considered given her age but I have low-suspicion in a patient with bilateral pain, no vision changes, and lack of jaw claudication. Additionally her ESR was at the upper limits of normal.  Plan: 1. MRI w/ and w/out contrast 2. Discontinue ibuprofen use 3. Continue Tylenol 1-2 tablets q4-6h PRN pain

## 2018-03-04 NOTE — Addendum Note (Signed)
Addended by: Carroll Sage on: 03/04/2018 01:15 PM   Modules accepted: Orders

## 2018-03-05 ENCOUNTER — Telehealth: Payer: Self-pay

## 2018-03-05 NOTE — Telephone Encounter (Signed)
I spoke with pt today to let her know the order for the MRI Brain W Wo Contrast was faxed to [facility]. This location was the prefer location for the insurance. Explained to patient that they will contact pt to scheduled an appt.

## 2018-03-19 ENCOUNTER — Encounter: Payer: BLUE CROSS/BLUE SHIELD | Admitting: Internal Medicine

## 2018-03-19 ENCOUNTER — Other Ambulatory Visit: Payer: Self-pay | Admitting: *Deleted

## 2018-03-19 DIAGNOSIS — M542 Cervicalgia: Secondary | ICD-10-CM

## 2018-03-19 DIAGNOSIS — G8929 Other chronic pain: Secondary | ICD-10-CM

## 2018-03-19 DIAGNOSIS — K219 Gastro-esophageal reflux disease without esophagitis: Secondary | ICD-10-CM

## 2018-03-20 MED ORDER — PANTOPRAZOLE SODIUM 40 MG PO TBEC: 40.0000 mg | DELAYED_RELEASE_TABLET | Freq: Every day | ORAL | 1 refills | Status: DC

## 2018-03-20 NOTE — Telephone Encounter (Signed)
Pt needs refill on acid reflux [place], [place]; pt contact [phone]

## 2018-03-25 ENCOUNTER — Other Ambulatory Visit: Payer: Self-pay | Admitting: Internal Medicine

## 2018-03-25 DIAGNOSIS — G8929 Other chronic pain: Secondary | ICD-10-CM

## 2018-03-25 DIAGNOSIS — M542 Cervicalgia: Principal | ICD-10-CM

## 2018-04-10 ENCOUNTER — Other Ambulatory Visit: Payer: Self-pay | Admitting: *Deleted

## 2018-04-10 DIAGNOSIS — L309 Dermatitis, unspecified: Secondary | ICD-10-CM

## 2018-04-10 NOTE — Telephone Encounter (Signed)
Request is for a bigger tube-80 grams.  Please advise.Despina Hidden Cassady3/26/20203:23 PM

## 2018-04-16 ENCOUNTER — Other Ambulatory Visit: Payer: Self-pay

## 2018-04-16 DIAGNOSIS — F331 Major depressive disorder, recurrent, moderate: Secondary | ICD-10-CM

## 2018-04-16 MED ORDER — FLUOXETINE HCL 20 MG PO CAPS: 40.0000 mg | ORAL_CAPSULE | Freq: Every day | ORAL | 2 refills | Status: DC

## 2018-04-16 NOTE — Telephone Encounter (Signed)
FLUoxetine (PROZAC) 20 MG capsule, refill request @  [place], [place] [phone] (Phone) [phone] (Fax)

## 2018-04-18 ENCOUNTER — Other Ambulatory Visit: Payer: Self-pay

## 2018-04-18 ENCOUNTER — Encounter: Payer: Self-pay | Admitting: Internal Medicine

## 2018-04-18 ENCOUNTER — Ambulatory Visit (INDEPENDENT_AMBULATORY_CARE_PROVIDER_SITE_OTHER): Payer: BLUE CROSS/BLUE SHIELD | Admitting: Internal Medicine

## 2018-04-18 ENCOUNTER — Telehealth: Payer: Self-pay | Admitting: *Deleted

## 2018-04-18 VITALS — BP 140/83 | HR 93 | Temp 98.1°F | Ht 64.0 in | Wt 200.9 lb

## 2018-04-18 DIAGNOSIS — X501XXA Overexertion from prolonged static or awkward postures, initial encounter: Secondary | ICD-10-CM | POA: Diagnosis not present

## 2018-04-18 DIAGNOSIS — S29012A Strain of muscle and tendon of back wall of thorax, initial encounter: Secondary | ICD-10-CM | POA: Diagnosis not present

## 2018-04-18 MED ORDER — CELECOXIB 100 MG PO CAPS: 100.0000 mg | ORAL_CAPSULE | Freq: Two times a day (BID) | ORAL | 0 refills | Status: DC

## 2018-04-18 NOTE — Progress Notes (Signed)
   CC: Back pain  HPI:  [name] is a 53 y.o. female with PMHx listed below presenting for back pain. Please see the A&P for the status of the patient's chronic medical problems.  Past Medical History:  Diagnosis Date  . Acne   . Allergic rhinitis   . Asthma   . Bacterial vaginitis    recurrent  . Bilateral carpal tunnel syndrome   . Chronic constipation   . External hemorrhoids with complication   . Genital herpes   . High risk sexual behavior   . History of cocaine abuse ([place]) 1992  . History of tobacco abuse 1999  . Hyperlipidemia   . Hypertension   . Recurrent boils   . Recurrent UTI    Review of Systems:  Performed and all others negative.  Physical Exam: Vitals:   04/18/18 1506  BP: 140/83  Pulse: 93  Temp: 98.1 F (36.7 C)  TempSrc: Oral  SpO2: 99%  Weight: 200 lb 14.4 oz (91.1 kg)  Height: 5\' 4"  (1.626 m)   General: Well nourished female in no acute distress Pulm: Good air movement with no wheezing or crackles  CV: RRR, no murmurs, no rubs  MSK: Negative straight leg test bilaterally, tenderness to palpation of the left thoracic paraspinal muscle vs insertion of the trapeziums   Assessment & Plan:   See Encounters Tab for problem based charting.  Patient discussed with Dr. Beryle Beams

## 2018-04-18 NOTE — Telephone Encounter (Signed)
NURSING TRIAGE NOTE FOR RESPIRATORY SYMPTOMS  Do you have a fever?  Do you have a cough?  Do you have shortness of breath more than normal?  Do you have chest pain? Are you able to eat and drink normally?  Have you seen a physician for these symptoms?   [facility] md called pt  I informed patient to expect a phone call from a physician soon and I sent request to front desk to schedule a virtual office appt for patient and arrive the patient.

## 2018-04-18 NOTE — Patient Instructions (Signed)
Thank you for allowing [place] to provide your care. You are suffering from a muscle strain. I have sent out a medication that will help with the pain. Please take it with food.   Muscle Strain A muscle strain is an injury that happens when a muscle is stretched longer than normal. This can happen during a fall, sports, or lifting. This can tear some muscle fibers. Usually, recovery from muscle strain takes 1-2 weeks. Complete healing normally takes 5-6 weeks. This condition is first treated with PRICE therapy. This involves:  Protecting your muscle from being injured again.  Resting your injured muscle.  Icing your injured muscle.  Applying pressure (compression) to your injured muscle. This may be done with a splint or elastic bandage.  Raising (elevating) your injured muscle. Your doctor may also recommend medicine for pain. Follow these instructions at home: If you have a splint:  Wear the splint as told by your doctor. Take it off only as told by your doctor.  Loosen the splint if your fingers or toes tingle, get numb, or turn cold and blue.  Keep the splint clean.  If the splint is not waterproof: ? Do not let it get wet. ? Cover it with a watertight covering when you take a bath or a shower. Managing pain, stiffness, and swelling   If directed, put ice on your injured area. ? If you have a removable splint, take it off as told by your doctor. ? Put ice in a plastic bag. ? Place a towel between your skin and the bag. ? Leave the ice on for 20 minutes, 2-3 times a day.  Move your fingers or toes often. This helps to avoid stiffness and lessen swelling.  Raise your injured area above the level of your heart while you are sitting or lying down.  Wear an elastic bandage as told by your doctor. Make sure it is not too tight. General instructions  Take over-the-counter and prescription medicines only as told by your doctor.  Limit your activity. Rest your injured muscle as  told by your doctor. Your doctor may say that gentle movements are okay.  If physical therapy was prescribed, do exercises as told by your doctor.  Do not put pressure on any part of the splint until it is fully hardened. This may take many hours.  Do not use any products that contain nicotine or tobacco, such as cigarettes and e-cigarettes. These can delay bone healing. If you need help quitting, ask your doctor.  Warm up before you exercise. This helps to prevent more muscle strains.  Ask your doctor when it is safe to drive if you have a splint.  Keep all follow-up visits as told by your doctor. This is important. Contact a doctor if:  You have more pain or swelling in your injured area. Get help right away if:  You have any of these problems in your injured area: ? You have numbness. ? You have tingling. ? You lose a lot of strength. Summary  A muscle strain is an injury that happens when a muscle is stretched longer than normal.  This condition is first treated with PRICE therapy. This includes protecting, resting, icing, adding pressure, and raising your injury.  Limit your activity. Rest your injured muscle as told by your doctor. Your doctor may say that gentle movements are okay.  Warm up before you exercise. This helps to prevent more muscle strains. This information is not intended to replace advice given to  you by your health care provider. Make sure you discuss any questions you have with your health care provider. Document Released: 10/11/2007 Document Revised: 02/08/2016 Document Reviewed: 02/08/2016 Elsevier Interactive Patient Education  2019 [employer].

## 2018-04-18 NOTE — Progress Notes (Signed)
Medicine attending: Medical history, presenting problems, physical findings, and medications, reviewed with resident physician Dr Justin Helberg on the day of the patient visit and I concur with his evaluation and management plan. 

## 2018-04-18 NOTE — Assessment & Plan Note (Signed)
HPI: Patient presented to be evaluated for left sided back pain. Started after lifting a tent and twisting to move it. Has tried OTC back aide, NSAIDs, tylenol, Voltaren gel, and flexeril without relief. Twisting movements make it worse. She is able to sleep at night. Worried this is 2/2 COVID or her lungs. She denies fevers, chills, SHOB, cough, dysuria, polyuria.   A/P: Based on symptoms and PE she likely has stained muscle. Will treat symptomatically with short course of celebrex. Discussed this should be taken with food. She is on a PPI. She will call back if the pain is not improved.

## 2018-05-02 ENCOUNTER — Other Ambulatory Visit: Payer: Self-pay

## 2018-05-02 DIAGNOSIS — S29012A Strain of muscle and tendon of back wall of thorax, initial encounter: Secondary | ICD-10-CM

## 2018-05-02 MED ORDER — CELECOXIB 100 MG PO CAPS: 100.0000 mg | ORAL_CAPSULE | Freq: Two times a day (BID) | ORAL | 0 refills | Status: DC

## 2018-05-02 NOTE — Telephone Encounter (Signed)
celecoxib (CELEBREX) 100 MG capsule   Refill request @  [place], [place] [phone] (Phone) [phone] (Fax)

## 2018-05-08 ENCOUNTER — Other Ambulatory Visit: Payer: Self-pay | Admitting: *Deleted

## 2018-05-08 DIAGNOSIS — J309 Allergic rhinitis, unspecified: Secondary | ICD-10-CM

## 2018-05-08 MED ORDER — CETIRIZINE HCL 10 MG PO TABS: 10.0000 mg | ORAL_TABLET | Freq: Every day | ORAL | 1 refills | Status: DC

## 2018-05-08 NOTE — Telephone Encounter (Signed)
Requesting a refill on Cetirizine 10 mg  Take 1 tab by mouth once daily . Thanks

## 2018-05-08 NOTE — Telephone Encounter (Signed)
Filled this

## 2018-05-08 NOTE — Telephone Encounter (Signed)
Thank you :)

## 2018-05-14 ENCOUNTER — Telehealth: Payer: Self-pay | Admitting: Internal Medicine

## 2018-05-14 DIAGNOSIS — N63 Unspecified lump in unspecified breast: Secondary | ICD-10-CM

## 2018-05-14 NOTE — Telephone Encounter (Signed)
Spoke with patient. She does breast exams at home and noticed a nickel sized lump in her breast. It has been present for the past week. She has never had this happen before. She states it is not really tender. She has not noticed any skin changes to her breast but has not really looked. She denies symptoms of recent fever, weight loss. She does have night sweats sometimes as she is going through menopause. Discussed whether she should come into the clinic or go straight for mammogram and she would prefer this as she is due anyway. I think this is reasonable considering the current covid situation.   - diagnostic mammogram  Marty Heck, DO 05/14/2018, 2:21 PM Pager: [phone]

## 2018-05-14 NOTE — Telephone Encounter (Signed)
Pls contact [phone]; she needs to have an referral for her mammogram.. Pt is feeling a lump [phone]

## 2018-05-14 NOTE — Telephone Encounter (Signed)
Spoke with patient and mammogram ordered

## 2018-05-15 ENCOUNTER — Other Ambulatory Visit: Payer: Self-pay | Admitting: Internal Medicine

## 2018-05-15 DIAGNOSIS — N63 Unspecified lump in unspecified breast: Secondary | ICD-10-CM

## 2018-05-20 ENCOUNTER — Other Ambulatory Visit: Payer: Self-pay | Admitting: Internal Medicine

## 2018-05-20 DIAGNOSIS — L309 Dermatitis, unspecified: Secondary | ICD-10-CM

## 2018-07-24 ENCOUNTER — Encounter: Payer: Self-pay | Admitting: Internal Medicine

## 2018-08-08 ENCOUNTER — Other Ambulatory Visit: Payer: Self-pay | Admitting: Internal Medicine

## 2018-08-18 ENCOUNTER — Other Ambulatory Visit: Payer: Self-pay

## 2018-08-18 DIAGNOSIS — Z20822 Contact with and (suspected) exposure to covid-19: Secondary | ICD-10-CM

## 2018-08-19 LAB — NOVEL CORONAVIRUS, NAA: SARS-CoV-2, NAA: NOT DETECTED

## 2018-09-23 ENCOUNTER — Other Ambulatory Visit: Payer: Self-pay | Admitting: Internal Medicine

## 2018-09-23 DIAGNOSIS — Z8619 Personal history of other infectious and parasitic diseases: Secondary | ICD-10-CM

## 2018-09-23 DIAGNOSIS — I10 Essential (primary) hypertension: Secondary | ICD-10-CM

## 2018-11-11 ENCOUNTER — Other Ambulatory Visit: Payer: Self-pay | Admitting: Internal Medicine

## 2018-11-11 DIAGNOSIS — L309 Dermatitis, unspecified: Secondary | ICD-10-CM

## 2018-12-02 ENCOUNTER — Other Ambulatory Visit: Payer: Self-pay | Admitting: Internal Medicine

## 2018-12-02 DIAGNOSIS — L309 Dermatitis, unspecified: Secondary | ICD-10-CM

## 2019-01-12 ENCOUNTER — Other Ambulatory Visit: Payer: Self-pay | Admitting: Internal Medicine

## 2019-01-12 DIAGNOSIS — L309 Dermatitis, unspecified: Secondary | ICD-10-CM

## 2019-01-23 ENCOUNTER — Encounter (HOSPITAL_COMMUNITY): Payer: Self-pay

## 2019-01-23 ENCOUNTER — Ambulatory Visit (HOSPITAL_COMMUNITY)
Admission: EM | Admit: 2019-01-23 | Discharge: 2019-01-23 | Disposition: A | Discharge status: Home / Self Care | Payer: BLUE CROSS/BLUE SHIELD | Attending: Family Medicine | Admitting: Family Medicine

## 2019-01-23 ENCOUNTER — Other Ambulatory Visit: Payer: Self-pay

## 2019-01-23 DIAGNOSIS — G5601 Carpal tunnel syndrome, right upper limb: Secondary | ICD-10-CM

## 2019-01-23 MED ORDER — PREDNISONE 10 MG (21) PO TBPK: ORAL_TABLET | Freq: Every day | ORAL | 0 refills | Status: DC

## 2019-01-23 MED ORDER — HYDROCODONE-ACETAMINOPHEN 5-325 MG PO TABS: 1.0000 | ORAL_TABLET | Freq: Four times a day (QID) | ORAL | 0 refills | Status: DC | PRN
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
## 2019-01-23 NOTE — ED Triage Notes (Addendum)
Pt. States she has carpal tunnel & her right hand is swollen with her thumb/pointer finger being numb for about 2 wks now. States there is a burning sensations in her right hand as well.

## 2019-01-23 NOTE — Discharge Instructions (Signed)
Begin prednisone taper over the next 6 days.  Begin with 6 tablets / 60 mg and decrease by 1 tablet each day until you complete.  Please take this medicine with food and in the morning if you are able. You may use hydrocodone for severe pain, limit use to at home and bedtime.  Will cause drowsiness.  Do not drive or work after taking. As swelling is improving please wear thumb spica wrist brace in order to immobilize your wrist and hand to further help with resting.  You may use Ace wrap as alternative in the meantime.  Please follow-up with hand-Dr. Amedeo Plenty contact below for further discussion and treatment of carpal tunnel If developing increased redness pain or swelling in the meantime please follow-up here or emergency room

## 2019-01-26 ENCOUNTER — Ambulatory Visit: Payer: BLUE CROSS/BLUE SHIELD

## 2019-01-26 ENCOUNTER — Encounter: Payer: Self-pay | Admitting: Internal Medicine

## 2019-02-06 ENCOUNTER — Telehealth: Payer: Self-pay | Admitting: *Deleted

## 2019-02-06 NOTE — Telephone Encounter (Signed)
Received fax request from [employer] for greater qty of triamcinolone 0.1%. 15 sent. Request is for 80. Please send new Rx if appropriate. Hubbard Hartshorn, BSN, RN-BC

## 2019-02-09 ENCOUNTER — Ambulatory Visit (INDEPENDENT_AMBULATORY_CARE_PROVIDER_SITE_OTHER): Payer: 59 | Admitting: Internal Medicine

## 2019-02-09 VITALS — BP 141/77 | HR 86 | Temp 98.7°F | Ht 63.0 in | Wt 207.6 lb

## 2019-02-09 DIAGNOSIS — L309 Dermatitis, unspecified: Secondary | ICD-10-CM | POA: Diagnosis not present

## 2019-02-09 DIAGNOSIS — Z79899 Other long term (current) drug therapy: Secondary | ICD-10-CM

## 2019-02-09 DIAGNOSIS — J45909 Unspecified asthma, uncomplicated: Secondary | ICD-10-CM

## 2019-02-09 DIAGNOSIS — I10 Essential (primary) hypertension: Secondary | ICD-10-CM

## 2019-02-09 DIAGNOSIS — G5601 Carpal tunnel syndrome, right upper limb: Secondary | ICD-10-CM

## 2019-02-09 DIAGNOSIS — G8929 Other chronic pain: Secondary | ICD-10-CM

## 2019-02-09 DIAGNOSIS — G5603 Carpal tunnel syndrome, bilateral upper limbs: Secondary | ICD-10-CM

## 2019-02-09 MED ORDER — TRIAMCINOLONE ACETONIDE 0.1 % EX OINT: TOPICAL_OINTMENT | CUTANEOUS | 0 refills | Status: DC

## 2019-02-09 NOTE — Telephone Encounter (Signed)
Called pt - stated she currently having eczema flare-up on her back and side. And the little tube she gets from the pharmacy only last about a week. Agreed to schedule an appt - appt scheduled for today @ 1315 PM in [facility].

## 2019-02-09 NOTE — Progress Notes (Signed)
   CC: eczema  HPI:  Carrie Franco is a 54 y.o. with PMH as below.   Please see A&P for assessment of the patient's acute and chronic medical conditions.   She has a history of asthma and eczema for many years. Flares usually occur on her trunk in small patches. She states triamcinolone ointment is the only thing that really works very well. She uses the ointment a couple of times a week when she has a flare until the skin starts to heal, which can be several weeks. Her eczema is always worse during the winter, and she currently has four patches on her trunk.   Past Medical History:  Diagnosis Date  . Acne   . Allergic rhinitis   . Asthma   . Bacterial vaginitis    recurrent  . Bilateral carpal tunnel syndrome   . Chronic constipation   . External hemorrhoids with complication   . Genital herpes   . High risk sexual behavior   . History of cocaine abuse ([place]) 1992  . History of tobacco abuse 1999  . Hyperlipidemia   . Hypertension   . Recurrent boils   . Recurrent UTI    Review of Systems:   10 point ROS negative except as noted in HPI  Physical Exam: Constitution: NAD, appears stated age HENT: [place]/AT Cardio: regular rate, no LE edema  Respiratory: CTA, no w/r/r  Neuro: normal affect, a&ox3 Skin: areas of dry, rough skin patches ~4x4 cm around the trunk    Vitals:   02/09/19 1336  BP: (!) 141/77  Pulse: 86  Temp: 98.7 F (37.1 C)  TempSrc: Oral  SpO2: 99%  Weight: 207 lb 9.6 oz (94.2 kg)  Height: 5\' 3"  (1.6 m)     Assessment & Plan:   See Encounters Tab for problem based charting.  Patient discussed with Dr. Rebeca Alert

## 2019-02-09 NOTE — Telephone Encounter (Signed)
I'm a little worried that she has been having multiple eczema flairs requiring refills. The steroid cream can cause skin breakdown over time. If she's continuing to have flares she may want to come into the clinic to get checked out. Thanks!

## 2019-02-09 NOTE — Patient Instructions (Addendum)
Thank you for allowing [place] to provide your care today. Today we discussed your eczema and hypertension.   I have ordered the following labs for you:  Basic metabolic panel    I will call if any are abnormal.    Today we made the following changes to your medications:   Please START taking  Triamcinolone ointment - use two times per day for 3-4 weeks. Make sure not to use for more than 4 weeks at a time.    Please follow-up in one month to assess symptoms.    Please call the internal medicine center clinic if you have any questions or concerns, we may be able to help and keep you from a long and expensive emergency room wait. Our clinic and after hours phone number is [phone], the best time to call is Monday through Friday 9 am to 4 pm but there is always someone available 24/7 if you have an emergency. If you need medication refills please notify your pharmacy one week in advance and they will send [place] a request.

## 2019-02-10 LAB — BMP8+ANION GAP
Anion Gap: 15 mmol/L (ref 10.0–18.0)
BUN/Creatinine Ratio: 17 (ref 9–23)
BUN: 12 mg/dL (ref 6–24)
CO2: 22 mmol/L (ref 20–29)
Calcium: 9.6 mg/dL (ref 8.7–10.2)
Chloride: 105 mmol/L (ref 96–106)
Creatinine, Ser: 0.72 mg/dL (ref 0.57–1.00)
GFR calc Af Amer: 110 mL/min/{1.73_m2} (ref >59)
GFR calc non Af Amer: 95 mL/min/{1.73_m2} (ref >59)
Glucose: 105 mg/dL — ABNORMAL HIGH (ref 65–99)
Potassium: 3.8 mmol/L (ref 3.5–5.2)
Sodium: 142 mmol/L (ref 134–144)

## 2019-02-10 NOTE — Assessment & Plan Note (Signed)
She receives lyrica for chronic pain associated with tingling from carpal tunnel and possibly cervical radiculopathy. She states they are unable to refill her gabepentin because they cannot refill the generic version and that is all her insurance will approve. She continues to have tingling in her right hand, and she may be getting a carpal release procedure.   - will contact pain clinic prior to refilling. PDMP reviewed and she would be due for medication refill soon.

## 2019-02-10 NOTE — Assessment & Plan Note (Addendum)
She has a history of asthma and eczema for many years. Flares usually occur on her trunk in small patches. She states triamcinolone ointment is the only thing that really works very well. She uses the ointment a couple of times a week when she has a flare until the skin starts to heal, which can be several weeks. Her eczema is always worse during the winter, and she currently has four patches on her trunk.   Areas of eczema are about 4x4-5x5 cm, dry, rough skin.   - triamcinolone ointment - apply 2x per day for up to 3-4 weeks. Discussed not using longer than this and the risks of long term use  - use aquaphor for milder symptoms and in between topical steroid use.    - f/u one month

## 2019-02-10 NOTE — Assessment & Plan Note (Signed)
BP Readings from Last 3 Encounters:  02/09/19 (!) 141/77  01/23/19 (!) 149/82  04/18/18 140/83   Blood pressure mildly elevated today. She is taking her norvasc 10mg  and lisinopril 10 mg qd.   - bmp - f/u one month for bp check

## 2019-02-13 NOTE — Progress Notes (Signed)
Internal Medicine Clinic Attending  Case discussed with Dr. Seawell at the time of the visit.  We reviewed the resident's history and exam and pertinent patient test results.  I agree with the assessment, diagnosis, and plan of care documented in the resident's note.  Gari Hartsell, M.D., Ph.D.  

## 2019-03-09 ENCOUNTER — Other Ambulatory Visit (HOSPITAL_COMMUNITY)
Admission: RE | Admit: 2019-03-09 | Discharge: 2019-03-09 | Disposition: A | Discharge status: Home / Self Care | Payer: 59 | Source: Ambulatory Visit | Attending: Internal Medicine | Admitting: Internal Medicine

## 2019-03-09 ENCOUNTER — Ambulatory Visit (INDEPENDENT_AMBULATORY_CARE_PROVIDER_SITE_OTHER): Payer: 59 | Admitting: Radiation Oncology

## 2019-03-09 ENCOUNTER — Encounter: Payer: Self-pay | Admitting: Radiation Oncology

## 2019-03-09 VITALS — BP 149/80 | HR 93 | Temp 98.9°F | Ht 63.0 in | Wt 208.6 lb

## 2019-03-09 DIAGNOSIS — N898 Other specified noninflammatory disorders of vagina: Secondary | ICD-10-CM | POA: Diagnosis present

## 2019-03-09 DIAGNOSIS — Z86010 Personal history of colonic polyps: Secondary | ICD-10-CM | POA: Diagnosis not present

## 2019-03-09 NOTE — Patient Instructions (Addendum)
Thank you for coming to your appointment. It was so nice to see you. Today we discussed  Diagnoses and all orders for this visit:  Vaginal discharge -     Pelvic exam, sample sent for testing  History of colonic polyps -     Ambulatory referral to Gastroenterology  I will call you with lab results.  Sincerely,  Al Decant, MD

## 2019-03-09 NOTE — Progress Notes (Signed)
   CC: vaginal discharge  HPI:  Carrie Franco is a 54 y.o. female who presents to the Internal Medicine Clinic for vaginal discharge. Please see Assessment and Plan for full HPI.  Past Medical History:  Diagnosis Date  . Acne   . Allergic rhinitis   . Asthma   . Bacterial vaginitis    recurrent  . Bilateral carpal tunnel syndrome   . Chronic constipation   . External hemorrhoids with complication   . Genital herpes   . High risk sexual behavior   . History of cocaine abuse ([place]) 1992  . History of tobacco abuse 1999  . Hyperlipidemia   . Hypertension   . Recurrent boils   . Recurrent UTI    Review of Systems:  Please see Assessment and Plan for full ROS.  Physical Exam:  Vitals:   03/09/19 1527  BP: (!) 149/80  Pulse: 93  Temp: 98.9 F (37.2 C)  TempSrc: Oral  SpO2: 100%  Weight: 208 lb 9.6 oz (94.6 kg)  Height: 5\' 3"  (1.6 m)   Physical Exam  Constitutional: She is well-developed, well-nourished, and in no distress. No distress.  Pulmonary/Chest: Effort normal. No respiratory distress.  Abdominal: Soft. She exhibits no distension.  Genitourinary:    Vagina normal.     No vaginal discharge.     Genitourinary Comments: No obvious external hemorrhoids or anal bleeding; digital rectal exam declined   Musculoskeletal:        General: Normal range of motion.  Neurological: She is alert.  Skin: Skin is warm and dry. She is not diaphoretic.   Assessment & Plan:   See Encounters Tab for problem based charting.  Patient discussed with Dr. Lynnae January

## 2019-03-10 ENCOUNTER — Encounter: Payer: Self-pay | Admitting: Radiation Oncology

## 2019-03-10 NOTE — Assessment & Plan Note (Signed)
Patient due for colonoscopy but hasn't gotten it yet as she has been scared to go anywhere unecessarily due to [place]. She reports a hx of constipation and hemorrhoids and a recent hx of anal bleeding and hematochezia with a large painful bowel movement. Patient encouraged to undergo colonoscopy for screening and for evaluation of hematochezia. While this is likely hemorrhoids, the blood in the stool is somewhat concerning.   On exam, no obvious external hemorrhoids. Patient declined digital rectal exam.  Plan: -referral to GI for colonoscopy

## 2019-03-10 NOTE — Assessment & Plan Note (Addendum)
This is new and uncontrolled. Patient presenting with 4 weeks of white vaginal discharge. She has not been sexually active in 8 mo until last night. She states she came in today because she thought it had gotten worse since the sexual encounter. She denies pruritis or pain. She states her partner had a negative HIV test and that they did not use protection. She is anxious to know the cause of her discharge.  On exam, patient afebrile. Her vulva and vagina are normal appearing. No vulvar excoriations or erythema. No vaginal discharge.  Plan: -wet prep and GC/Chlamydia screening

## 2019-03-11 LAB — CERVICOVAGINAL ANCILLARY ONLY
Bacterial Vaginitis (gardnerella): POSITIVE — AB
Candida Glabrata: NEGATIVE
Candida Vaginitis: NEGATIVE
Chlamydia: NEGATIVE
Comment: NEGATIVE
Comment: NEGATIVE
Comment: NEGATIVE
Comment: NEGATIVE
Comment: NEGATIVE
Comment: NORMAL
Neisseria Gonorrhea: NEGATIVE
Trichomonas: NEGATIVE

## 2019-03-11 MED ORDER — METRONIDAZOLE 500 MG PO TABS: 500.0000 mg | ORAL_TABLET | Freq: Two times a day (BID) | ORAL | 0 refills | Status: AC

## 2019-03-11 NOTE — Addendum Note (Signed)
Addended by: Adela Ports on: 03/11/2019 01:51 PM   Modules accepted: Orders

## 2019-03-16 ENCOUNTER — Other Ambulatory Visit: Payer: Self-pay | Admitting: Internal Medicine

## 2019-03-16 DIAGNOSIS — L309 Dermatitis, unspecified: Secondary | ICD-10-CM

## 2019-03-16 DIAGNOSIS — K219 Gastro-esophageal reflux disease without esophagitis: Secondary | ICD-10-CM

## 2019-03-17 NOTE — Progress Notes (Signed)
Internal Medicine Clinic Attending  Case discussed with Dr. Lanierat the time of the visit.  We reviewed the resident's history and exam and pertinent patient test results.  I agree with the assessment, diagnosis, and plan of care documented in the resident's note.   

## 2019-03-19 ENCOUNTER — Telehealth: Payer: Self-pay | Admitting: Internal Medicine

## 2019-03-19 NOTE — Telephone Encounter (Signed)
RTC to patient, VM obtained and mailbox was full, RN unable to leave message. SChaplin, RN,BSN

## 2019-03-19 NOTE — Telephone Encounter (Signed)
Pt calling  To report her itching has not stopped after taking her medication and would like to know what to take.  PLease call patient back.

## 2019-03-23 NOTE — Telephone Encounter (Signed)
Will need [place] appointment for reevaluation of vaginal itching.

## 2019-03-23 NOTE — Telephone Encounter (Signed)
RTC to patient, informed RN tried to call her last week but her VM box was full, pt laughing and states "yes, it was". Pt still c/o vaginal itching, no odor, with small amount of white discharge.  LOV 03/09/19 with +BV labs.  Pt states she was given RX for metronidazole and this helped some, but she is still itching and request 2nd RX. Will send to Dr. Madilyn Fireman for consideration. SChaplin, RN,BSN

## 2019-03-23 NOTE — Telephone Encounter (Signed)
Pt is calling back regarding medicine, pls return call [phone]

## 2019-03-24 ENCOUNTER — Ambulatory Visit (INDEPENDENT_AMBULATORY_CARE_PROVIDER_SITE_OTHER): Payer: 59 | Admitting: Internal Medicine

## 2019-03-24 ENCOUNTER — Other Ambulatory Visit: Payer: Self-pay

## 2019-03-24 ENCOUNTER — Encounter: Payer: Self-pay | Admitting: Internal Medicine

## 2019-03-24 DIAGNOSIS — N898 Other specified noninflammatory disorders of vagina: Secondary | ICD-10-CM | POA: Diagnosis not present

## 2019-03-24 MED ORDER — METRONIDAZOLE 500 MG PO TABS: 500.0000 mg | ORAL_TABLET | Freq: Two times a day (BID) | ORAL | 0 refills | Status: AC

## 2019-03-24 NOTE — Telephone Encounter (Signed)
Appt sch for 03/24/2019 with [facility].

## 2019-03-24 NOTE — Assessment & Plan Note (Addendum)
Patient is presenting with recurrent vaginal discharge. She was treated for BV after previous visit on 03/10/2019 for discharge and mild itching. She reports improvement while taking the medication and return of discharge and some increase in vaginal itching after stopping treatment. She states she has had yeast infections in the past and this does not feel like those and prior evaluation was negative for yeast. She denies any external vaginal abnormalities. - Will treat with 2 week course of Metronidazole due to incomplete treatment with 1 week - If symptoms return or persist she will need return for repeat exam - If recurrent BV she is to be treated with clindamycin after metronidazole failure (and to decrease risk of metronidazole induce neuropathy) - If >3 episodes of BV in 1 year she will need prolong/supressive treatment - Return precautions given

## 2019-03-24 NOTE — Progress Notes (Signed)
Internal Medicine Clinic Attending  Case discussed with Dr. Trilby Drummer  at the time of the visit.  We reviewed the resident's history and pertinent patient test results.  I agree with the assessment, diagnosis, and plan of care documented in the resident's note.

## 2019-03-24 NOTE — Progress Notes (Signed)
  This is a telephone encounter between [name] and Carrie Franco on 03/24/2019. The visit was conducted with the patient located at home and Carrie Franco at [facility]. The patient's identity was confirmed using their DOB and current address. The patient has consented to being evaluated through a telephone encounter and understands the associated risks (an examination cannot be done and the patient may need to come in for an appointment) / benefits (allows the patient to remain at home, decreasing exposure to coronavirus). I personally spent 5 minutes on medical discussion.   CC: Vaginal discharge   HPI:  [name] is a 54 y.o. F with PMHx listed below presenting for Vaginal discharge. Please see the A&P for the status of the patient's chronic medical problems.  Past Medical History:  Diagnosis Date  . Acne   . Allergic rhinitis   . Asthma   . Bacterial vaginitis    recurrent  . Bilateral carpal tunnel syndrome   . Chronic constipation   . External hemorrhoids with complication   . Genital herpes   . High risk sexual behavior   . History of cocaine abuse ([place]) 1992  . History of tobacco abuse 1999  . Hyperlipidemia   . Hypertension   . Recurrent boils   . Recurrent UTI    Review of Systems:  Performed and all others negative.  Physical Exam:  There were no vitals filed for this visit. Not performed for tele-health visit  Assessment & Plan:   See Encounters Tab for problem based charting.  Patient discussed with Dr. Evette Doffing

## 2019-03-30 ENCOUNTER — Encounter: Payer: Self-pay | Admitting: Internal Medicine

## 2019-03-30 ENCOUNTER — Other Ambulatory Visit: Payer: Self-pay

## 2019-03-30 ENCOUNTER — Ambulatory Visit (INDEPENDENT_AMBULATORY_CARE_PROVIDER_SITE_OTHER): Payer: 59 | Admitting: Internal Medicine

## 2019-03-30 DIAGNOSIS — F331 Major depressive disorder, recurrent, moderate: Secondary | ICD-10-CM

## 2019-03-30 MED ORDER — FLUOXETINE HCL 40 MG PO CAPS: 40.0000 mg | ORAL_CAPSULE | Freq: Every day | ORAL | 1 refills | Status: DC

## 2019-03-30 NOTE — Progress Notes (Signed)
   CC: Depression  HPI:  [name] is a 54 y.o. F with PMHx listed below presenting for Depression. Please see the A&P for the status of the patient's chronic medical problems.  Past Medical History:  Diagnosis Date  . Acne   . Allergic rhinitis   . Asthma   . Bacterial vaginitis    recurrent  . Bilateral carpal tunnel syndrome   . Chronic constipation   . External hemorrhoids with complication   . Genital herpes   . High risk sexual behavior   . History of cocaine abuse ([place]) 1992  . History of tobacco abuse 1999  . Hyperlipidemia   . Hypertension   . Recurrent boils   . Recurrent UTI    Review of Systems:  Performed and all others negative.  Physical Exam:  There were no vitals filed for this visit. Not performed for tele-health visit  Assessment & Plan:   See Encounters Tab for problem based charting.  Patient discussed with Dr. Rebeca Alert

## 2019-03-30 NOTE — Assessment & Plan Note (Signed)
Patient reports worsening of her depression symptoms in recent days and weeks. These are primarily related to sleep, concentration, and depressed mood. She denies any SI. PHQ-9 score of 10.  She She states she feels that when her last prescription for Prozac was filled as generic Fluoxetine she had worse symptoms. She also states that she stopped taking Gabapentin (which was prescribed by her orthopedist) for hand pain recently as well. She states has had a lot of difficulty with sleep primarily due to this hand pain.  Her symptoms are likely due to her increased pain and difficulty sleeping, but the change of her RX may be related. Her Rx per our records should have expired last year, but she denies stopping for any period of time. Will refill with Brand prescription. I have instructed her to contact her Orthopedist for improved pain control. We also discussed sleep hygiene. I will provide her with a referral to speak with our counselor to help with her increase in symptoms. - Prozac 40mg  Daily - Referral to [facility] - Sleep Hygiene (consider Trazodone if sleep issue persist after pain control) - Instructed her to contact ortho to help with pain control for hand issue managed by their office

## 2019-03-31 NOTE — Progress Notes (Signed)
Internal Medicine Clinic Attending  Case discussed with Dr. Melvin at the time of the visit.  We reviewed the resident's history and exam and pertinent patient test results.  I agree with the assessment, diagnosis, and plan of care documented in the resident's note.  Franchesca Veneziano, M.D., Ph.D.  

## 2019-04-03 ENCOUNTER — Emergency Department (HOSPITAL_COMMUNITY)
Admission: EM | Admit: 2019-04-03 | Discharge: 2019-04-03 | Disposition: A | Discharge status: Home / Self Care | Payer: 59 | Attending: Emergency Medicine | Admitting: Emergency Medicine

## 2019-04-03 ENCOUNTER — Encounter (HOSPITAL_COMMUNITY): Payer: Self-pay | Admitting: Emergency Medicine

## 2019-04-03 DIAGNOSIS — R519 Headache, unspecified: Secondary | ICD-10-CM | POA: Diagnosis not present

## 2019-04-03 DIAGNOSIS — M25561 Pain in right knee: Secondary | ICD-10-CM | POA: Diagnosis not present

## 2019-04-03 DIAGNOSIS — I10 Essential (primary) hypertension: Secondary | ICD-10-CM | POA: Diagnosis not present

## 2019-04-03 DIAGNOSIS — M25562 Pain in left knee: Secondary | ICD-10-CM | POA: Diagnosis not present

## 2019-04-03 DIAGNOSIS — J45909 Unspecified asthma, uncomplicated: Secondary | ICD-10-CM | POA: Insufficient documentation

## 2019-04-03 DIAGNOSIS — M25512 Pain in left shoulder: Secondary | ICD-10-CM | POA: Diagnosis not present

## 2019-04-03 DIAGNOSIS — M542 Cervicalgia: Secondary | ICD-10-CM | POA: Diagnosis present

## 2019-04-03 DIAGNOSIS — Z79899 Other long term (current) drug therapy: Secondary | ICD-10-CM | POA: Diagnosis not present

## 2019-04-03 DIAGNOSIS — M25511 Pain in right shoulder: Secondary | ICD-10-CM | POA: Insufficient documentation

## 2019-04-03 DIAGNOSIS — Z87891 Personal history of nicotine dependence: Secondary | ICD-10-CM | POA: Insufficient documentation

## 2019-04-03 MED ORDER — CYCLOBENZAPRINE HCL 10 MG PO TABS: 10.0000 mg | ORAL_TABLET | Freq: Two times a day (BID) | ORAL | 0 refills | Status: DC | PRN

## 2019-04-03 MED ORDER — IBUPROFEN 800 MG PO TABS
800.0000 mg | ORAL_TABLET | Freq: Once | ORAL | Status: AC
  Administered 2019-04-03: 800 mg via ORAL
  Filled 2019-04-03: qty 1

## 2019-04-03 MED ORDER — IBUPROFEN 600 MG PO TABS: 600.0000 mg | ORAL_TABLET | Freq: Four times a day (QID) | ORAL | 0 refills | Status: DC | PRN
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
## 2019-04-03 NOTE — ED Notes (Signed)
[place], PA at bedside 
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
## 2019-04-07 ENCOUNTER — Telehealth: Payer: Self-pay | Admitting: Licensed Clinical Social Worker

## 2019-04-07 NOTE — Telephone Encounter (Signed)
Patient was called and she answered. Patient reported it was not a good time for her and requested to be called back.

## 2019-04-08 ENCOUNTER — Other Ambulatory Visit: Payer: Self-pay

## 2019-04-08 ENCOUNTER — Ambulatory Visit (HOSPITAL_COMMUNITY)
Admission: RE | Admit: 2019-04-08 | Discharge: 2019-04-08 | Disposition: A | Discharge status: Home / Self Care | Payer: 59 | Source: Ambulatory Visit | Attending: Internal Medicine | Admitting: Internal Medicine

## 2019-04-08 ENCOUNTER — Ambulatory Visit (INDEPENDENT_AMBULATORY_CARE_PROVIDER_SITE_OTHER): Payer: 59 | Admitting: Internal Medicine

## 2019-04-08 DIAGNOSIS — R0789 Other chest pain: Secondary | ICD-10-CM | POA: Diagnosis not present

## 2019-04-08 DIAGNOSIS — M25512 Pain in left shoulder: Secondary | ICD-10-CM

## 2019-04-08 DIAGNOSIS — Y92410 Unspecified street and highway as the place of occurrence of the external cause: Secondary | ICD-10-CM | POA: Insufficient documentation

## 2019-04-08 DIAGNOSIS — Y929 Unspecified place or not applicable: Secondary | ICD-10-CM | POA: Diagnosis not present

## 2019-04-08 DIAGNOSIS — G8911 Acute pain due to trauma: Secondary | ICD-10-CM

## 2019-04-08 DIAGNOSIS — M25562 Pain in left knee: Secondary | ICD-10-CM | POA: Diagnosis present

## 2019-04-08 DIAGNOSIS — S86912A Strain of unspecified muscle(s) and tendon(s) at lower leg level, left leg, initial encounter: Secondary | ICD-10-CM

## 2019-04-08 DIAGNOSIS — M25511 Pain in right shoulder: Secondary | ICD-10-CM | POA: Diagnosis not present

## 2019-04-08 DIAGNOSIS — R079 Chest pain, unspecified: Secondary | ICD-10-CM | POA: Diagnosis not present

## 2019-04-08 DIAGNOSIS — Y939 Activity, unspecified: Secondary | ICD-10-CM | POA: Insufficient documentation

## 2019-04-08 DIAGNOSIS — Y999 Unspecified external cause status: Secondary | ICD-10-CM | POA: Diagnosis not present

## 2019-04-08 MED ORDER — CYCLOBENZAPRINE HCL 5 MG PO TABS: 5.0000 mg | ORAL_TABLET | Freq: Two times a day (BID) | ORAL | 0 refills | Status: DC | PRN

## 2019-04-08 NOTE — Patient Instructions (Signed)
Thank you for allowing [place] to provide your care today.  I have ordered a chest x-ray and left knee x-ray for you. Please complete these today or tomorrow.   I have refilled flexaril for you for five days.   Physical Therapy will call you to make an appointment.   Please follow-up in if symptoms fail to improve.    Please call the internal medicine center clinic if you have any questions or concerns, we may be able to help and keep you from a long and expensive emergency room wait. Our clinic and after hours phone number is [phone], the best time to call is Monday through Friday 9 am to 4 pm but there is always someone available 24/7 if you have an emergency. If you need medication refills please notify your pharmacy one week in advance and they will send [place] a request.

## 2019-04-08 NOTE — Progress Notes (Signed)
   CC: Knee pain   HPI:  [name] is a 54 y.o. with PMH as below.   Please see A&P for assessment of the patient's acute and chronic medical conditions.   Past Medical History:  Diagnosis Date  . Acne   . Allergic rhinitis   . Asthma   . Bacterial vaginitis    recurrent  . Bilateral carpal tunnel syndrome   . Chronic constipation   . External hemorrhoids with complication   . Genital herpes   . High risk sexual behavior   . History of cocaine abuse ([place]) 1992  . History of tobacco abuse 1999  . Hyperlipidemia   . Hypertension   . Recurrent boils   . Recurrent UTI    Review of Systems:   Review of Systems  Respiratory: Negative for cough, shortness of breath and wheezing.   Cardiovascular: Positive for chest pain. Negative for palpitations, orthopnea and leg swelling.  Musculoskeletal: Positive for joint pain and myalgias. Negative for falls.  Neurological: Negative for dizziness, tingling, focal weakness, weakness and headaches.  Endo/Heme/Allergies: Does not bruise/bleed easily.   Physical Exam: Constitution: NAD, appears stated age Cardio: RRR, no m/r/g, no LE edema Respiratory: CTA, no w/r/r Abdominal: NTTP, soft, non-distended MSK: chest wall diffusely tender, full active and passive ROM knees and shoulder joints. TTP trapezius bilaterally and left anterior knee.  Neuro: normal affect, a&ox3 Skin: c/d/i, no bruising or swelling    There were no vitals filed for this visit.   Assessment & Plan:   See Encounters Tab for problem based charting.  Patient discussed with Dr. Daryll Drown

## 2019-04-14 ENCOUNTER — Encounter: Payer: Self-pay | Admitting: *Deleted

## 2019-04-14 ENCOUNTER — Telehealth: Payer: Self-pay | Admitting: Licensed Clinical Social Worker

## 2019-04-14 NOTE — Telephone Encounter (Signed)
Patient was called to discuss a referral for services. Patient agreed to the referral, and will be added to my schedule for 4/20 @ 12:00 via phone.

## 2019-04-15 ENCOUNTER — Encounter: Payer: Self-pay | Admitting: Internal Medicine
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
## 2019-04-21 ENCOUNTER — Ambulatory Visit: Payer: 59 | Attending: Internal Medicine | Admitting: Physical Therapy

## 2019-04-21 ENCOUNTER — Other Ambulatory Visit: Payer: Self-pay

## 2019-04-21 DIAGNOSIS — R252 Cramp and spasm: Secondary | ICD-10-CM | POA: Insufficient documentation

## 2019-04-21 DIAGNOSIS — M6281 Muscle weakness (generalized): Secondary | ICD-10-CM | POA: Diagnosis present

## 2019-04-21 DIAGNOSIS — M545 Low back pain, unspecified: Secondary | ICD-10-CM

## 2019-04-21 DIAGNOSIS — M25562 Pain in left knee: Secondary | ICD-10-CM | POA: Insufficient documentation

## 2019-04-21 DIAGNOSIS — M25561 Pain in right knee: Secondary | ICD-10-CM | POA: Diagnosis present

## 2019-04-21 DIAGNOSIS — R2681 Unsteadiness on feet: Secondary | ICD-10-CM | POA: Diagnosis present

## 2019-04-21 DIAGNOSIS — G8929 Other chronic pain: Secondary | ICD-10-CM | POA: Insufficient documentation

## 2019-04-21 NOTE — Patient Instructions (Addendum)
   Voncille Lo, PT Certified Exercise Expert for the Aging Adult  04/21/19 2:28 PM Phone: [phone] Fax: [phone]
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
## 2019-04-21 NOTE — Progress Notes (Signed)
Internal Medicine Clinic Attending  Case discussed with Dr. Seawell at the time of the visit.  We reviewed the resident's history and exam and pertinent patient test results.  I agree with the assessment, diagnosis, and plan of care documented in the resident's note.    

## 2019-04-23 NOTE — Addendum Note (Signed)
Addended by: Molli Hazard A on: 04/23/2019 02:24 PM   Modules accepted: Orders

## 2019-04-27 ENCOUNTER — Ambulatory Visit: Payer: 59

## 2019-04-28 ENCOUNTER — Encounter: Payer: Self-pay | Admitting: Internal Medicine

## 2019-04-28 ENCOUNTER — Ambulatory Visit: Payer: 59

## 2019-05-04 ENCOUNTER — Ambulatory Visit: Payer: 59 | Admitting: Physical Therapy

## 2019-05-04 ENCOUNTER — Other Ambulatory Visit: Payer: Self-pay

## 2019-05-04 DIAGNOSIS — M545 Low back pain, unspecified: Secondary | ICD-10-CM

## 2019-05-04 DIAGNOSIS — M25562 Pain in left knee: Secondary | ICD-10-CM

## 2019-05-04 DIAGNOSIS — R252 Cramp and spasm: Secondary | ICD-10-CM

## 2019-05-04 DIAGNOSIS — G8929 Other chronic pain: Secondary | ICD-10-CM
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
## 2019-05-05 ENCOUNTER — Ambulatory Visit: Payer: 59 | Admitting: Licensed Clinical Social Worker

## 2019-05-05 ENCOUNTER — Telehealth: Payer: Self-pay | Admitting: Licensed Clinical Social Worker

## 2019-05-05 NOTE — Telephone Encounter (Signed)
Patient was called for her scheduled appointment. Patient needed to reschedule, and reported she would call our office back to do so.

## 2019-05-11 ENCOUNTER — Encounter: Payer: 59 | Admitting: Physical Therapy

## 2019-05-18 ENCOUNTER — Ambulatory Visit: Payer: 59 | Attending: Internal Medicine | Admitting: Physical Therapy

## 2019-05-18 ENCOUNTER — Encounter: Payer: Self-pay | Admitting: Physical Therapy

## 2019-05-18 ENCOUNTER — Other Ambulatory Visit: Payer: Self-pay

## 2019-05-18 DIAGNOSIS — R252 Cramp and spasm: Secondary | ICD-10-CM | POA: Insufficient documentation

## 2019-05-18 DIAGNOSIS — G8929 Other chronic pain: Secondary | ICD-10-CM | POA: Diagnosis present

## 2019-05-18 DIAGNOSIS — M6281 Muscle weakness (generalized): Secondary | ICD-10-CM | POA: Insufficient documentation

## 2019-05-18 DIAGNOSIS — M545 Low back pain, unspecified: Secondary | ICD-10-CM

## 2019-05-18 DIAGNOSIS — M25562 Pain in left knee: Secondary | ICD-10-CM | POA: Diagnosis not present

## 2019-05-18 DIAGNOSIS — R2681 Unsteadiness on feet: Secondary | ICD-10-CM | POA: Diagnosis present

## 2019-05-18 DIAGNOSIS — M25561 Pain in right knee: Secondary | ICD-10-CM | POA: Diagnosis present
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
## 2019-05-25 ENCOUNTER — Encounter: Payer: Self-pay | Admitting: Physical Therapy

## 2019-05-25 ENCOUNTER — Ambulatory Visit: Payer: 59 | Admitting: Physical Therapy

## 2019-05-25 ENCOUNTER — Other Ambulatory Visit: Payer: Self-pay

## 2019-05-25 DIAGNOSIS — M25562 Pain in left knee: Secondary | ICD-10-CM

## 2019-05-25 DIAGNOSIS — M545 Low back pain, unspecified: Secondary | ICD-10-CM

## 2019-05-25 DIAGNOSIS — G8929 Other chronic pain: Secondary | ICD-10-CM

## 2019-05-25 DIAGNOSIS — R252 Cramp and spasm: Secondary | ICD-10-CM

## 2019-05-25 DIAGNOSIS — M6281 Muscle weakness (generalized): Secondary | ICD-10-CM

## 2019-05-25 DIAGNOSIS — R2681 Unsteadiness on feet: Secondary | ICD-10-CM
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
## 2019-05-27 ENCOUNTER — Other Ambulatory Visit: Payer: Self-pay

## 2019-05-27 ENCOUNTER — Ambulatory Visit (INDEPENDENT_AMBULATORY_CARE_PROVIDER_SITE_OTHER): Payer: 59 | Admitting: Internal Medicine

## 2019-05-27 VITALS — BP 133/63 | HR 86 | Temp 98.2°F | Ht 63.0 in | Wt 207.0 lb

## 2019-05-27 DIAGNOSIS — F331 Major depressive disorder, recurrent, moderate: Secondary | ICD-10-CM

## 2019-05-27 DIAGNOSIS — I872 Venous insufficiency (chronic) (peripheral): Secondary | ICD-10-CM

## 2019-05-27 DIAGNOSIS — I10 Essential (primary) hypertension: Secondary | ICD-10-CM | POA: Diagnosis not present

## 2019-05-27 DIAGNOSIS — G4762 Sleep related leg cramps: Secondary | ICD-10-CM

## 2019-05-27 DIAGNOSIS — G8929 Other chronic pain: Secondary | ICD-10-CM

## 2019-05-27 DIAGNOSIS — R252 Cramp and spasm: Secondary | ICD-10-CM | POA: Diagnosis not present

## 2019-05-27 DIAGNOSIS — M542 Cervicalgia: Secondary | ICD-10-CM

## 2019-05-27 DIAGNOSIS — G5603 Carpal tunnel syndrome, bilateral upper limbs: Secondary | ICD-10-CM

## 2019-05-27 DIAGNOSIS — Z1211 Encounter for screening for malignant neoplasm of colon: Secondary | ICD-10-CM

## 2019-05-27 DIAGNOSIS — Z79899 Other long term (current) drug therapy: Secondary | ICD-10-CM

## 2019-05-27 DIAGNOSIS — Z0000 Encounter for general adult medical examination without abnormal findings: Secondary | ICD-10-CM

## 2019-05-27 DIAGNOSIS — E785 Hyperlipidemia, unspecified: Secondary | ICD-10-CM

## 2019-05-27 DIAGNOSIS — Z1231 Encounter for screening mammogram for malignant neoplasm of breast: Secondary | ICD-10-CM

## 2019-05-27 DIAGNOSIS — F329 Major depressive disorder, single episode, unspecified: Secondary | ICD-10-CM

## 2019-05-27 MED ORDER — PRAVASTATIN SODIUM 40 MG PO TABS: 40.0000 mg | ORAL_TABLET | Freq: Every day | ORAL | 3 refills | Status: DC

## 2019-05-27 MED ORDER — CYCLOBENZAPRINE HCL 5 MG PO TABS: 5.0000 mg | ORAL_TABLET | Freq: Two times a day (BID) | ORAL | 0 refills | Status: DC | PRN
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
## 2019-05-27 NOTE — Patient Instructions (Addendum)
Thank you for allowing [place] to provide your care today. Today we discussed your neck pain and leg cramps      Today we made the following changes to your medications:   Please START taking  Pravastatin 40 mg - take one tablet per day   Please STOP taking   Atorvastatin 10 mg   Please follow-up in six months for labs and to check lipid panel.    Please call the internal medicine center clinic if you have any questions or concerns, we may be able to help and keep you from a long and expensive emergency room wait. Our clinic and after hours phone number is [phone], the best time to call is Monday through Friday 9 am to 4 pm but there is always someone available 24/7 if you have an emergency. If you need medication refills please notify your pharmacy one week in advance and they will send [place] a request.

## 2019-05-28 ENCOUNTER — Encounter: Payer: Self-pay | Admitting: Internal Medicine

## 2019-05-28 DIAGNOSIS — I872 Venous insufficiency (chronic) (peripheral): Secondary | ICD-10-CM

## 2019-05-28 HISTORY — DX: Venous insufficiency (chronic) (peripheral): I87.2

## 2019-05-28 NOTE — Assessment & Plan Note (Signed)
PHQ-9 today 7. She states she has been doing well lately and denies feeling depressed, especially while she is on her prozac.   - continue current medications
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
## 2019-05-28 NOTE — Assessment & Plan Note (Signed)
She has been having leg cramps at night. She has tried to massage her legs at night, but this has not helped. She endorses drinking lots of water and eating fruits and vegetables. She denies numbness.   - switch atorvastatin to pravastatin  - CBC and ferritin at follow-up

## 2019-05-28 NOTE — Assessment & Plan Note (Signed)
BP Readings from Last 3 Encounters:  05/27/19 133/63  04/08/19 124/72  04/03/19 138/82   Current medications include lisinopril 10 mg qd. BP well controlled. She denies chest pain or shortness of breath. She has occasional lower extremity swelling which improves with elevation.   - cont. Lisinopril 10 mg - BMP at follow-up

## 2019-05-28 NOTE — Assessment & Plan Note (Signed)
She is on atorvastatin 10 mg qd. She has been experiencing leg cramps at night. See leg cramps under problem list.   - switch atorvastatin to pravastatin 40 mg  - lipid panel at follow-up

## 2019-05-28 NOTE — Assessment & Plan Note (Signed)
She has intermittent swelling in her legs when she is on her feet a lot. She states this improves with elevating her feet and when she uses compression stockings. Encouraged continued use of compression stockings.

## 2019-05-28 NOTE — Assessment & Plan Note (Signed)
-   refer for mammogram screening - refer for colonoscopy
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
## 2019-06-01 ENCOUNTER — Ambulatory Visit
Admission: RE | Admit: 2019-06-01 | Discharge: 2019-06-01 | Disposition: A | Discharge status: Home / Self Care | Payer: 59 | Source: Ambulatory Visit | Attending: Internal Medicine | Admitting: Internal Medicine

## 2019-06-01 ENCOUNTER — Other Ambulatory Visit: Payer: Self-pay

## 2019-06-01 ENCOUNTER — Ambulatory Visit: Payer: 59 | Admitting: Physical Therapy

## 2019-06-01 ENCOUNTER — Other Ambulatory Visit: Payer: Self-pay | Admitting: Internal Medicine

## 2019-06-01 DIAGNOSIS — Z1231 Encounter for screening mammogram for malignant neoplasm of breast: Secondary | ICD-10-CM

## 2019-06-02 NOTE — Progress Notes (Signed)
Internal Medicine Clinic Attending  Case discussed with Dr. Seawell at the time of the visit.  We reviewed the resident's history and exam and pertinent patient test results.  I agree with the assessment, diagnosis, and plan of care documented in the resident's note.    

## 2019-06-03 ENCOUNTER — Encounter: Payer: Self-pay | Admitting: *Deleted

## 2019-06-03 NOTE — Addendum Note (Signed)
Addended by: Hulan Fray on: 06/03/2019 06:22 PM   Modules accepted: Orders

## 2019-06-04 ENCOUNTER — Other Ambulatory Visit: Payer: Self-pay | Admitting: Internal Medicine

## 2019-06-04 DIAGNOSIS — J309 Allergic rhinitis, unspecified: Secondary | ICD-10-CM

## 2019-06-08 ENCOUNTER — Ambulatory Visit: Payer: 59 | Admitting: Physical Therapy

## 2019-06-22 ENCOUNTER — Encounter: Payer: 59 | Admitting: Physical Therapy

## 2019-06-23 ENCOUNTER — Other Ambulatory Visit: Payer: Self-pay | Admitting: Internal Medicine

## 2019-06-23 DIAGNOSIS — K219 Gastro-esophageal reflux disease without esophagitis: Secondary | ICD-10-CM

## 2019-06-30 ENCOUNTER — Encounter: Payer: Self-pay | Admitting: Internal Medicine

## 2019-06-30 ENCOUNTER — Other Ambulatory Visit: Payer: Self-pay

## 2019-06-30 ENCOUNTER — Ambulatory Visit (INDEPENDENT_AMBULATORY_CARE_PROVIDER_SITE_OTHER): Payer: 59 | Admitting: Internal Medicine

## 2019-06-30 VITALS — BP 128/77 | HR 81 | Temp 98.1°F | Wt 204.6 lb

## 2019-06-30 DIAGNOSIS — R109 Unspecified abdominal pain: Secondary | ICD-10-CM | POA: Diagnosis not present

## 2019-06-30 DIAGNOSIS — M545 Low back pain: Secondary | ICD-10-CM

## 2019-06-30 DIAGNOSIS — I10 Essential (primary) hypertension: Secondary | ICD-10-CM

## 2019-06-30 DIAGNOSIS — R300 Dysuria: Secondary | ICD-10-CM

## 2019-06-30 MED ORDER — IBUPROFEN 800 MG PO TABS: 800.0000 mg | ORAL_TABLET | Freq: Four times a day (QID) | ORAL | 1 refills | Status: DC | PRN

## 2019-06-30 MED ORDER — CYCLOBENZAPRINE HCL 5 MG PO TABS: 5.0000 mg | ORAL_TABLET | Freq: Two times a day (BID) | ORAL | 0 refills | Status: DC | PRN
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
## 2019-06-30 NOTE — Progress Notes (Signed)
Internal Medicine Clinic Attending  Case discussed with Dr. Krienke at the time of the visit.  We reviewed the resident's history and exam and pertinent patient test results.  I agree with the assessment, diagnosis, and plan of care documented in the resident's note.    
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
## 2019-06-30 NOTE — Patient Instructions (Addendum)
Carrie Franco,  It was a pleasure to see you today. Thank you for coming in.   Today we discussed your back and side pain. Your urine studies looked okay, it does not look like you have a urinary tract infection. It looks like you have a muscle strain. I have sent in some flexeril for you to use for the pain. I also sent in some ibuprofen that you can take. You can also use some heating pads on that area to help relieve the pain.   Please return to clinic in 3 months or sooner if needed.   Thank you again for coming in.   Lonia Skinner M.D.  Muscle Strain A muscle strain is an injury that happens when a muscle is stretched longer than normal. This can happen during a fall, sports, or lifting. This can tear some muscle fibers. Usually, recovery from muscle strain takes 1-2 weeks. Complete healing normally takes 5-6 weeks. This condition is first treated with PRICE therapy. This involves:  Protecting your muscle from being injured again.  Resting your injured muscle.  Icing your injured muscle.  Applying pressure (compression) to your injured muscle. This may be done with a splint or elastic bandage.  Raising (elevating) your injured muscle. Your doctor may also recommend medicine for pain. Follow these instructions at home: If you have a splint:  Wear the splint as told by your doctor. Take it off only as told by your doctor.  Loosen the splint if your fingers or toes tingle, get numb, or turn cold and blue.  Keep the splint clean.  If the splint is not waterproof: ? Do not let it get wet. ? Cover it with a watertight covering when you take a bath or a shower. Managing pain, stiffness, and swelling   If directed, put ice on your injured area. ? If you have a removable splint, take it off as told by your doctor. ? Put ice in a plastic bag. ? Place a towel between your skin and the bag. ? Leave the ice on for 20 minutes, 2-3 times a day.  Move your fingers or toes  often. This helps to avoid stiffness and lessen swelling.  Raise your injured area above the level of your heart while you are sitting or lying down.  Wear an elastic bandage as told by your doctor. Make sure it is not too tight. General instructions  Take over-the-counter and prescription medicines only as told by your doctor.  Limit your activity. Rest your injured muscle as told by your doctor. Your doctor may say that gentle movements are okay.  If physical therapy was prescribed, do exercises as told by your doctor.  Do not put pressure on any part of the splint until it is fully hardened. This may take many hours.  Do not use any products that contain nicotine or tobacco, such as cigarettes and e-cigarettes. These can delay bone healing. If you need help quitting, ask your doctor.  Warm up before you exercise. This helps to prevent more muscle strains.  Ask your doctor when it is safe to drive if you have a splint.  Keep all follow-up visits as told by your doctor. This is important. Contact a doctor if:  You have more pain or swelling in your injured area. Get help right away if:  You have any of these problems in your injured area: ? You have numbness. ? You have tingling. ? You lose a lot of strength. Summary  A muscle strain is an injury that happens when a muscle is stretched longer than normal.  This condition is first treated with PRICE therapy. This includes protecting, resting, icing, adding pressure, and raising your injury.  Limit your activity. Rest your injured muscle as told by your doctor. Your doctor may say that gentle movements are okay.  Warm up before you exercise. This helps to prevent more muscle strains. This information is not intended to replace advice given to you by your health care provider. Make sure you discuss any questions you have with your health care provider. Document Revised: 02/27/2018 Document Reviewed: 02/08/2016 Elsevier Patient  Education  [place].

## 2019-06-30 NOTE — Assessment & Plan Note (Signed)
Patient is on lisinopril 10 mg daily and amlodipine 10 mg daily. She denies any issues taking her medications.  Blood pressure today is well controlled at 128/77.  No changes at this time.  -Check BMP today

## 2019-07-01 LAB — BMP8+ANION GAP
Anion Gap: 16 mmol/L (ref 10.0–18.0)
BUN/Creatinine Ratio: 19 (ref 9–23)
BUN: 15 mg/dL (ref 6–24)
CO2: 20 mmol/L (ref 20–29)
Calcium: 9.5 mg/dL (ref 8.7–10.2)
Chloride: 104 mmol/L (ref 96–106)
Creatinine, Ser: 0.77 mg/dL (ref 0.57–1.00)
GFR calc Af Amer: 101 mL/min/{1.73_m2} (ref >59)
GFR calc non Af Amer: 88 mL/min/{1.73_m2} (ref >59)
Glucose: 101 mg/dL — ABNORMAL HIGH (ref 65–99)
Potassium: 4.3 mmol/L (ref 3.5–5.2)
Sodium: 140 mmol/L (ref 134–144)

## 2019-07-01 LAB — URINALYSIS, COMPLETE
Bilirubin, UA: NEGATIVE
Glucose, UA: NEGATIVE
Ketones, UA: NEGATIVE
Nitrite, UA: NEGATIVE
Protein,UA: NEGATIVE
RBC, UA: NEGATIVE
Specific Gravity, UA: 1.02 (ref 1.005–1.030)
Urobilinogen, Ur: 0.2 mg/dL (ref 0.2–1.0)
pH, UA: 7 (ref 5.0–7.5)

## 2019-07-01 LAB — MICROSCOPIC EXAMINATION
Casts: NONE SEEN /lpf
RBC, Urine: NONE SEEN /hpf (ref 0–2)

## 2019-07-09 ENCOUNTER — Other Ambulatory Visit: Payer: Self-pay

## 2019-07-09 ENCOUNTER — Other Ambulatory Visit (HOSPITAL_COMMUNITY)
Admission: RE | Admit: 2019-07-09 | Discharge: 2019-07-09 | Disposition: A | Discharge status: Home / Self Care | Payer: 59 | Source: Ambulatory Visit | Attending: Internal Medicine | Admitting: Internal Medicine

## 2019-07-09 ENCOUNTER — Ambulatory Visit (INDEPENDENT_AMBULATORY_CARE_PROVIDER_SITE_OTHER): Payer: 59 | Admitting: Internal Medicine

## 2019-07-09 VITALS — BP 132/58 | HR 76 | Temp 98.3°F | Ht 63.0 in

## 2019-07-09 DIAGNOSIS — N764 Abscess of vulva: Secondary | ICD-10-CM

## 2019-07-09 DIAGNOSIS — N898 Other specified noninflammatory disorders of vagina: Secondary | ICD-10-CM

## 2019-07-09 HISTORY — DX: Abscess of vulva: N76.4

## 2019-07-09 NOTE — Patient Instructions (Signed)
Thank you for allowing [place] to provide your care today. Today we discussed the boil. We tried to deraign the boil and I hope you feel better after that. Please use sitz bath 3 times a day as well as heat compress in the area.  I have ordered labs for you. I will call if any are abnormal.    Today we made no changes to your medications.    Please come back to clinic if no improvement in 1 week or earlier if your symptoms get worse or not improved. As always, if having severe symptoms, please seek medical attention at emergency room. Should you have any questions or concerns please call the internal medicine clinic at [phone].    Thank you!

## 2019-07-09 NOTE — Assessment & Plan Note (Signed)
Patient presents with complaint painful boil at right labia majora since last week. No drainage from the lesion.  She used some heat compress and sitz bath with no significant improvement.  No fever chills.  She denies any urinary symptoms but reports some vaginal discharge recently.  She states that she did not have any sexual contact in past 18-month. On exam: There is a boil/frontal at right labia majora.  No active drainage.  Slightly tender.  No erythema or swelling at the area.  Small mobile, mildly tender inguinal lymph node at opposite site.  Assessment and plan: Labia majora furuncle/boil.  Can be due to ingrowing hair.  No active infection present. Per patient's request, deraigned the furuncle, using a 21 gage needle after prepping and cleaning the area with Betadine and using sterile technic. Small amount of drainage removed. -Recommended to continue using sitz bath 2-3 times a day and heat compress  -F/u in clinic if no improvement in 1 week -If no improvement, will consider referring to GYN

## 2019-07-09 NOTE — Assessment & Plan Note (Signed)
No associated itching. Sending cervicovaginal ancillary swab test today.
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
## 2019-07-10 LAB — CERVICOVAGINAL ANCILLARY ONLY
Bacterial Vaginitis (gardnerella): POSITIVE — AB
Candida Glabrata: NEGATIVE
Candida Vaginitis: NEGATIVE
Chlamydia: NEGATIVE
Comment: NEGATIVE
Comment: NEGATIVE
Comment: NEGATIVE
Comment: NEGATIVE
Comment: NEGATIVE
Comment: NORMAL
Neisseria Gonorrhea: NEGATIVE
Trichomonas: NEGATIVE

## 2019-07-10 NOTE — Progress Notes (Signed)
Internal Medicine Clinic Attending  Case discussed with Dr. Myrtie Hawk at the time of the visit.  We reviewed the resident's history and exam and pertinent patient test results.  I agree with the assessment, diagnosis, and plan of care documented in the resident's note.  BV resulted positive, will call her to follow up and treat.  Lenice Pressman, M.D., Ph.D.

## 2019-07-11 ENCOUNTER — Other Ambulatory Visit: Payer: Self-pay | Admitting: Internal Medicine

## 2019-07-13 ENCOUNTER — Other Ambulatory Visit: Payer: Self-pay | Admitting: Internal Medicine

## 2019-07-13 ENCOUNTER — Telehealth: Payer: Self-pay | Admitting: Internal Medicine

## 2019-07-13 DIAGNOSIS — N898 Other specified noninflammatory disorders of vagina: Secondary | ICD-10-CM

## 2019-07-13 DIAGNOSIS — N764 Abscess of vulva: Secondary | ICD-10-CM

## 2019-07-13 MED ORDER — METRONIDAZOLE 500 MG PO TABS: 500.0000 mg | ORAL_TABLET | Freq: Three times a day (TID) | ORAL | 0 refills | Status: DC

## 2019-07-13 NOTE — Telephone Encounter (Signed)
Return pt's call - no answer; "call cannot be completed" - unable to leave a message.

## 2019-07-13 NOTE — Telephone Encounter (Signed)
Pls contact pt regarding boiled [phone]

## 2019-07-13 NOTE — Telephone Encounter (Signed)
Pt stated boil is not better; asking for referral to GYN. Thanks

## 2019-07-13 NOTE — Progress Notes (Signed)
I called patient and informed her about her test was positive for BV. I informed her that I sent a script for Metronidazole to her pharmacy. Also since no improvement with the boil on her labia majora, it is reasonable to refer her to GYN.  Patient appreciates the call.

## 2019-07-17 ENCOUNTER — Telehealth: Payer: Self-pay

## 2019-07-17 NOTE — Telephone Encounter (Signed)
rtc to pt, no answer, vmail full.  I would suggest if the pain is from the area on her labia is causing pain that she go to the womens [facility] de[facility] and see a womens health provider Do you agree with this

## 2019-07-17 NOTE — Telephone Encounter (Signed)
I agree

## 2019-07-17 NOTE — Telephone Encounter (Signed)
Please call pt back regarding pain.

## 2019-07-22 NOTE — Telephone Encounter (Signed)
Spoke w/ pt today and she will go to womens

## 2019-07-28 ENCOUNTER — Ambulatory Visit (INDEPENDENT_AMBULATORY_CARE_PROVIDER_SITE_OTHER): Payer: 59 | Admitting: Internal Medicine

## 2019-07-28 ENCOUNTER — Other Ambulatory Visit: Payer: Self-pay

## 2019-07-28 ENCOUNTER — Encounter: Payer: Self-pay | Admitting: Internal Medicine

## 2019-07-28 VITALS — BP 133/73 | HR 88 | Temp 98.9°F | Ht 63.0 in | Wt 205.2 lb

## 2019-07-28 DIAGNOSIS — G5603 Carpal tunnel syndrome, bilateral upper limbs: Secondary | ICD-10-CM

## 2019-07-28 DIAGNOSIS — M7989 Other specified soft tissue disorders: Secondary | ICD-10-CM

## 2019-07-28 DIAGNOSIS — M79643 Pain in unspecified hand: Secondary | ICD-10-CM

## 2019-07-28 HISTORY — DX: Other specified soft tissue disorders: M79.89

## 2019-07-28 LAB — C-REACTIVE PROTEIN: CRP: 0.5 mg/dL

## 2019-07-28 LAB — SEDIMENTATION RATE: Sed Rate: 34 mm/h — ABNORMAL HIGH (ref 0–22)

## 2019-07-28 MED ORDER — GABAPENTIN 100 MG PO CAPS: 100.0000 mg | ORAL_CAPSULE | Freq: Three times a day (TID) | ORAL | 0 refills | Status: DC

## 2019-07-28 MED ORDER — NAPROXEN 500 MG PO TABS: 500.0000 mg | ORAL_TABLET | Freq: Two times a day (BID) | ORAL | 0 refills | Status: AC

## 2019-07-28 MED ORDER — 1ST MEDX-PATCH/ LIDOCAINE 4-0.025-5-20 % EX PTCH: 1.0000 | MEDICATED_PATCH | CUTANEOUS | 3 refills | Status: DC
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
## 2019-07-28 NOTE — Assessment & Plan Note (Signed)
Patient notes following orthopedics for this and receives cortisone shots in bilateral wrists. She notes that she was supposed to see orthopedics several weeks ago; however has not been able to do so. Patient encouraged to follow up but may eventually need surgery for this as the cortisone shots are not as effective

## 2019-07-28 NOTE — Assessment & Plan Note (Signed)
Patient is presenting for evaluation of swelling and pain of her right hand extending to the right forearm for approximately one week duration. She notes baseline chronic pain secondary to her carpal tunnel syndrome; however, notes that it gradually worsened. She also endorses swelling on the right hand that is extending up to the right forearm. She notes significant difficulty with range of motion. She denies any trauma to the area. No prior episodes. She denies any fevers or chills. She has tried ibuprofen for this with intermittent relief and also tried massaging and lidocaine patch with splint with minimal relief. She endorses ongoing numbness along distribution of median nerve. She endorses similar type of intermittent/sharp pain in the left arm but no significant swelling.  On examination, patient has 1+ nonpitting edema extending up to her mid forearm. She has significantly limited range of motion of the fingers, thumb and wrist secondary to pain and swelling. No erythema, rash or increased warmth noted in the area. Less likely to be cellulitis. 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 Suspect she may have aspect of tenysynovitis with possible bursitis and carpal tunnel syndrome contributing to her presentation; however, this would not necessarily explain the edema in her hand. Differential also includes remitting seronegative symmetrical synovitis with pitting edema in setting of symmetric polyarthritis and synovitis; although does only have unilateral nonpitting edema on back of hands.   Plan:  - ESR, CRP, ANA - Naproxen 556m bid + Lidocaine patch for pain control - Consider further evaluation with RF and anti-CCP; if negative, could do trial of low dose corticosteroids

## 2019-07-28 NOTE — Patient Instructions (Addendum)
Carrie Franco,  It was a pleasure seeing you in clinic. Today we discussed:   Hand pain and swelling:  I am getting some lab work to assess for this. I am prescribing naproxen 500mg  twice daily (please take this with meals. DO NOT take ibuprofen while taking this). I am also prescribing gabapentin 100mg  three times daily to help with the nerve pain.    If you have any questions or concerns, please call our clinic at [phone] between 9am-5pm and after hours call [phone] and ask for the internal medicine resident on call. If you feel you are having a medical emergency please call 911.   Thank you, we look forward to helping you remain healthy!   If you have not already done so, I recommend getting the COVID 19 vaccine.  To schedule an appointment for a COVID vaccine or be added to the vaccine wait list: Go to [email or web address]   OR Go to [email or web address]                  OR Call [phone]                                     OR Call [phone] and select Option 2

## 2019-07-29 ENCOUNTER — Telehealth: Payer: Self-pay

## 2019-07-29 ENCOUNTER — Telehealth: Payer: Self-pay | Admitting: *Deleted

## 2019-07-29 LAB — ANTINUCLEAR ANTIBODIES, IFA: ANA Ab, IFA: NEGATIVE

## 2019-07-29 MED ORDER — LIDOCAINE 4 % EX PTCH: 1.0000 | MEDICATED_PATCH | CUTANEOUS | 0 refills | Status: DC

## 2019-07-29 NOTE — Telephone Encounter (Signed)
CONTACTED OFFICE FOR STATUS OF REFERRAL. NOT YET SCHEDULED. THEY'RE WORKING ON REFERRALS.

## 2019-07-29 NOTE — Telephone Encounter (Signed)
Will send Rx for salonpas. I believe she may be able to get this OTC as well if Rx not approved by insurance.

## 2019-07-29 NOTE — Telephone Encounter (Signed)
Patient was seen in clinic yesterday by Dr. Marva Panda.   Received fax from patient's pharmacy with message that Lidocaine patches prescribed not covered by her insurance and patient wanting something else called in that was cheaper. Pharmacist asking about Salonpas? Will forward to MD to advise. Thank you, SChaplin, RN,BSN

## 2019-08-03 NOTE — Progress Notes (Signed)
Internal Medicine Clinic Attending  I saw and evaluated the patient.  I personally confirmed the key portions of the history and exam documented by Dr. Aslam and I reviewed pertinent patient test results.  The assessment, diagnosis, and plan were formulated together and I agree with the documentation in the resident's note.     

## 2019-08-07 ENCOUNTER — Telehealth: Payer: Self-pay | Admitting: Gastroenterology

## 2019-08-07 NOTE — Telephone Encounter (Signed)
Hi Dr. Havery Moros,   We received a referral from PCP for a repeat colonoscopy. Patient has a colonoscopy done at [facility] in 2016. Dr. Edd Fabian office has been closed down and no other records are available. The Op\Path reports along with a few notes are available in Epic through care everywhere.   Please review and advise on scheduling.     Thank you

## 2019-08-10 ENCOUNTER — Other Ambulatory Visit: Payer: Self-pay | Admitting: Internal Medicine

## 2019-08-10 ENCOUNTER — Encounter: Payer: Self-pay | Admitting: Gastroenterology

## 2019-08-10 DIAGNOSIS — L309 Dermatitis, unspecified: Secondary | ICD-10-CM

## 2019-08-10 NOTE — Telephone Encounter (Signed)
Chart reviewed. Looks like she had multiple polyps removed on her last exam done 02/2013, it appears 4 of them were adenomas. Due for repeat colonoscopy now, can book direct at the [facility] if no significant comorbidities

## 2019-08-12 ENCOUNTER — Encounter: Payer: 59 | Admitting: Obstetrics and Gynecology

## 2019-08-13 NOTE — Addendum Note (Signed)
Addended by: Hulan Fray on: 08/13/2019 07:19 AM   Modules accepted: Orders

## 2019-09-01 ENCOUNTER — Other Ambulatory Visit: Payer: Self-pay | Admitting: Internal Medicine

## 2019-09-14 ENCOUNTER — Ambulatory Visit: Payer: 59 | Admitting: Physician Assistant

## 2019-09-16 ENCOUNTER — Ambulatory Visit: Payer: Self-pay

## 2019-09-16 ENCOUNTER — Telehealth: Payer: Self-pay | Admitting: Internal Medicine

## 2019-09-16 DIAGNOSIS — I10 Essential (primary) hypertension: Secondary | ICD-10-CM

## 2019-09-16 NOTE — Telephone Encounter (Signed)
Pt is calling regarding blood pressure; pt [phone]

## 2019-09-16 NOTE — Telephone Encounter (Signed)
Returned call to patient. States she has been out of lisinopril for 4-5 days. Rx last sent 11/2017 for 90 (10 mg) tabs with no refills. Patient does not know if she has been receiving this through outside provider. Call placed to [employer]. Pharmacist states last Rx sent 07/15/2018 by Dr. Theodoro Kos at [facility] for lisinopril 20 mg. Relayed this to patient. States she saw that provider when she had different insurance. She has appt with [facility] tomorrow. Advised her to bring all med bottles with her for complete med rec. States she will. Hubbard Hartshorn, BSN, RN-BC

## 2019-09-17 ENCOUNTER — Other Ambulatory Visit: Payer: Self-pay

## 2019-09-17 ENCOUNTER — Encounter: Payer: Self-pay | Admitting: Internal Medicine

## 2019-09-17 ENCOUNTER — Ambulatory Visit (HOSPITAL_COMMUNITY)
Admission: RE | Admit: 2019-09-17 | Discharge: 2019-09-17 | Disposition: A | Discharge status: Home / Self Care | Payer: 59 | Source: Ambulatory Visit | Attending: Internal Medicine | Admitting: Internal Medicine

## 2019-09-17 ENCOUNTER — Other Ambulatory Visit (HOSPITAL_COMMUNITY)
Admission: RE | Admit: 2019-09-17 | Discharge: 2019-09-17 | Disposition: A | Discharge status: Home / Self Care | Payer: 59 | Source: Ambulatory Visit | Attending: Student in an Organized Health Care Education/Training Program | Admitting: Student in an Organized Health Care Education/Training Program

## 2019-09-17 ENCOUNTER — Ambulatory Visit (INDEPENDENT_AMBULATORY_CARE_PROVIDER_SITE_OTHER): Payer: Self-pay | Admitting: Internal Medicine

## 2019-09-17 VITALS — BP 142/73 | HR 74 | Temp 98.0°F | Ht 63.0 in | Wt 203.3 lb

## 2019-09-17 DIAGNOSIS — G8929 Other chronic pain: Secondary | ICD-10-CM | POA: Diagnosis present

## 2019-09-17 DIAGNOSIS — M25561 Pain in right knee: Secondary | ICD-10-CM | POA: Diagnosis not present

## 2019-09-17 DIAGNOSIS — N898 Other specified noninflammatory disorders of vagina: Secondary | ICD-10-CM

## 2019-09-17 DIAGNOSIS — I10 Essential (primary) hypertension: Secondary | ICD-10-CM

## 2019-09-17 DIAGNOSIS — M7989 Other specified soft tissue disorders: Secondary | ICD-10-CM

## 2019-09-17 DIAGNOSIS — G5603 Carpal tunnel syndrome, bilateral upper limbs: Secondary | ICD-10-CM

## 2019-09-17 MED ORDER — NAPROXEN 500 MG PO TABS
500.0000 mg | ORAL_TABLET | Freq: Two times a day (BID) | ORAL | 0 refills | Status: AC
Start: 2019-09-17 — End: 2019-10-01

## 2019-09-17 NOTE — Patient Instructions (Signed)
To [name],  It was a pleasure working with you today. Today we discussed your knee pain, right arm pain, and vaginal discharge. For your pain we will start you back on naproxen. We will also order a knee xray, and an ultrasound of your right arm to rule out a blood clot. Please call your orthopedic doctor for your knee pain as well. Have a good day! Sincerely,  Maudie Mercury, MD
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
## 2019-09-18 ENCOUNTER — Encounter: Payer: Self-pay | Admitting: Internal Medicine

## 2019-09-18 ENCOUNTER — Ambulatory Visit (HOSPITAL_COMMUNITY)
Admission: RE | Admit: 2019-09-18 | Discharge: 2019-09-18 | Disposition: A | Discharge status: Home / Self Care | Payer: 59 | Source: Ambulatory Visit | Attending: Internal Medicine | Admitting: Internal Medicine

## 2019-09-18 DIAGNOSIS — M7989 Other specified soft tissue disorders: Secondary | ICD-10-CM | POA: Diagnosis present

## 2019-09-18 DIAGNOSIS — M25561 Pain in right knee: Secondary | ICD-10-CM | POA: Insufficient documentation

## 2019-09-18 HISTORY — DX: Pain in right knee: M25.561

## 2019-09-18 MED ORDER — LISINOPRIL 10 MG PO TABS: 20.0000 mg | ORAL_TABLET | Freq: Every day | ORAL | 3 refills | Status: DC

## 2019-09-18 MED ORDER — AMLODIPINE BESYLATE 10 MG PO TABS: 10.0000 mg | ORAL_TABLET | Freq: Every day | ORAL | 3 refills | Status: DC

## 2019-09-18 NOTE — Assessment & Plan Note (Signed)
Patient currently on amlodipine and lisinopril, but has been out of her lisinopril, presents to clinic with pressures of:  Vitals:   09/17/19 1525  BP: (!) 142/73  Pulse: 74  Temp: 98 F (36.7 C)  SpO2: 100%  Will reorder her lisinopril and amlodipine. Patient states that she has been adherent to her medications, and has not experienced adverse effects from her medications.  - refill amlodipine 10 mg QD and lisinopril 20 mg QD

## 2019-09-18 NOTE — Assessment & Plan Note (Signed)
Patient presents with monoarticular R knee pain. Patient states that her pain began three days ago when she was in the shower. She denies falls. She states that she does feel "an object" in her right knee that is not present in the left. She is wearing a brace that she bought for her knee pain. On examination of her right knee, no erythema or effusion was noted, she does have a mobile well circumscribed mass lateral to the patella tendon on physical examination. No joint tenderness to palpation.   We discussed that her pain may be 2/2 to osteoarthritis and to speak with her orthopaedist for evaluation. Patient voiced agreement. In the meantime will order xray and start patient back on naproxen for pain management.  - R Knee xray - Naproxen 500 mg BID

## 2019-09-18 NOTE — Assessment & Plan Note (Signed)
Patient presents today with malodorous, white vaginal discharge. She does endorse recurrent BV.  - Cervicovaginal ancillary swab - Treatment pending results.

## 2019-09-18 NOTE — Assessment & Plan Note (Signed)
Patient presents to the office with continued swelling of her hands. During her last visit a thorough rheumatological workup was performed. Patient continues to have complaints of pain, which was relieved with naproxen, but is unable to refill the medication. She denies trauma to her affected hand and arm. Due to swelling and localized pain. Will undergo a DVT study to r/o clots.  - Vas [place] upper extremity duplex ordered.  - Naproxen 500 mg BID

## 2019-09-18 NOTE — Progress Notes (Addendum)
Right upper extremity venous duplex completed. Refer to "CV Proc" under chart review to view preliminary results.  Attempted to call preliminary results to [phone] with no return call. Patient will be discharged home and advised to follow up with referring provider.  09/18/2019 9:34 AM Kelby Aline., MHA, RVT, RDCS, RDMS

## 2019-09-18 NOTE — Assessment & Plan Note (Signed)
Patient with known carpal tunnel presents to the clinic with Arm swelling. She does wear her brace at night, but takes it off during the day. She is following with orthopaedics, and has a bilateral carpal tunnel release procedure later this month.  - Follow with ortho - Continue wearing wrist brace.

## 2019-09-20 NOTE — Progress Notes (Signed)
Internal Medicine Clinic Attending ? ?Case discussed with Dr. Winters  At the time of the visit.  We reviewed the resident?s history and exam and pertinent patient test results.  I agree with the assessment, diagnosis, and plan of care documented in the resident?s note.  ?

## 2019-09-22 ENCOUNTER — Telehealth: Payer: Self-pay | Admitting: Internal Medicine

## 2019-09-22 DIAGNOSIS — B9689 Other specified bacterial agents as the cause of diseases classified elsewhere: Secondary | ICD-10-CM

## 2019-09-22 DIAGNOSIS — N760 Acute vaginitis: Secondary | ICD-10-CM

## 2019-09-22 LAB — CERVICOVAGINAL ANCILLARY ONLY
Bacterial Vaginitis (gardnerella): POSITIVE — AB
Candida Glabrata: NEGATIVE
Candida Vaginitis: NEGATIVE
Chlamydia: NEGATIVE
Comment: NEGATIVE
Comment: NEGATIVE
Comment: NEGATIVE
Comment: NEGATIVE
Comment: NEGATIVE
Comment: NORMAL
Neisseria Gonorrhea: NEGATIVE
Trichomonas: NEGATIVE

## 2019-09-22 MED ORDER — METRONIDAZOLE 500 MG PO TABS: 500.0000 mg | ORAL_TABLET | Freq: Two times a day (BID) | ORAL | 0 refills | Status: AC

## 2019-09-22 NOTE — Telephone Encounter (Addendum)
Patient recently seen by Dr.Winters for malodorous white vaginal discharge and a cervicovaginal ancillary swab completed. Result were back today and showed bacterial vaginosis. Results shared with patient and prescription sent to her pharmacy. We also reviewed negative findings of ultrasound of right arm. She has carpel tunnel surgery scheduled and will follow up if further workup is needed.     Bacterial vaginosis - metroNIDAZOLE (FLAGYL) 500 MG tablet; Take 1 tablet (500 mg total) by mouth 2 (two) times daily for 7 days.  Dispense: 14 tablet; Refill: 0

## 2019-09-28 ENCOUNTER — Encounter: Payer: 59 | Admitting: Gastroenterology

## 2019-09-28 NOTE — Addendum Note (Signed)
Addended by: Hulan Fray on: 09/28/2019 05:35 PM   Modules accepted: Orders

## 2019-10-09 ENCOUNTER — Encounter (HOSPITAL_BASED_OUTPATIENT_CLINIC_OR_DEPARTMENT_OTHER): Payer: Self-pay | Admitting: Orthopedic Surgery

## 2019-10-09 ENCOUNTER — Other Ambulatory Visit: Payer: Self-pay

## 2019-10-10 ENCOUNTER — Other Ambulatory Visit (HOSPITAL_COMMUNITY): Payer: Self-pay

## 2019-10-12 ENCOUNTER — Encounter (HOSPITAL_BASED_OUTPATIENT_CLINIC_OR_DEPARTMENT_OTHER)
Admission: RE | Admit: 2019-10-12 | Discharge: 2019-10-12 | Disposition: A | Discharge status: Home / Self Care | Payer: 59 | Source: Ambulatory Visit | Attending: Orthopedic Surgery | Admitting: Orthopedic Surgery

## 2019-10-12 ENCOUNTER — Other Ambulatory Visit (HOSPITAL_COMMUNITY)
Admission: RE | Admit: 2019-10-12 | Discharge: 2019-10-12 | Disposition: A | Discharge status: Home / Self Care | Payer: 59 | Source: Ambulatory Visit | Attending: Orthopedic Surgery | Admitting: Orthopedic Surgery

## 2019-10-12 DIAGNOSIS — Z20822 Contact with and (suspected) exposure to covid-19: Secondary | ICD-10-CM | POA: Insufficient documentation

## 2019-10-12 DIAGNOSIS — Z01812 Encounter for preprocedural laboratory examination: Secondary | ICD-10-CM | POA: Insufficient documentation

## 2019-10-12 LAB — SARS CORONAVIRUS 2 (TAT 6-24 HRS): SARS Coronavirus 2: NEGATIVE
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
## 2019-10-14 ENCOUNTER — Encounter (HOSPITAL_BASED_OUTPATIENT_CLINIC_OR_DEPARTMENT_OTHER): Payer: Self-pay | Admitting: Orthopedic Surgery

## 2019-10-14 ENCOUNTER — Ambulatory Visit (HOSPITAL_BASED_OUTPATIENT_CLINIC_OR_DEPARTMENT_OTHER)
Admission: RE | Admit: 2019-10-14 | Discharge: 2019-10-14 | Disposition: A | Discharge status: Home / Self Care | Payer: 59 | Attending: Orthopedic Surgery | Admitting: Orthopedic Surgery

## 2019-10-14 ENCOUNTER — Ambulatory Visit (HOSPITAL_BASED_OUTPATIENT_CLINIC_OR_DEPARTMENT_OTHER): Payer: 59 | Admitting: Anesthesiology

## 2019-10-14 ENCOUNTER — Other Ambulatory Visit: Payer: Self-pay

## 2019-10-14 ENCOUNTER — Encounter (HOSPITAL_BASED_OUTPATIENT_CLINIC_OR_DEPARTMENT_OTHER)
Admission: RE | Disposition: A | Discharge status: Home / Self Care | Payer: Self-pay | Source: Home / Self Care | Attending: Orthopedic Surgery

## 2019-10-14 DIAGNOSIS — I10 Essential (primary) hypertension: Secondary | ICD-10-CM | POA: Diagnosis not present

## 2019-10-14 DIAGNOSIS — Z91018 Allergy to other foods: Secondary | ICD-10-CM | POA: Diagnosis not present

## 2019-10-14 DIAGNOSIS — K5909 Other constipation: Secondary | ICD-10-CM | POA: Insufficient documentation

## 2019-10-14 DIAGNOSIS — Z888 Allergy status to other drugs, medicaments and biological substances status: Secondary | ICD-10-CM | POA: Insufficient documentation

## 2019-10-14 DIAGNOSIS — Z880 Allergy status to penicillin: Secondary | ICD-10-CM | POA: Insufficient documentation

## 2019-10-14 DIAGNOSIS — M1990 Unspecified osteoarthritis, unspecified site: Secondary | ICD-10-CM | POA: Insufficient documentation

## 2019-10-14 DIAGNOSIS — M659 Synovitis and tenosynovitis, unspecified: Secondary | ICD-10-CM | POA: Diagnosis not present

## 2019-10-14 DIAGNOSIS — F329 Major depressive disorder, single episode, unspecified: Secondary | ICD-10-CM | POA: Diagnosis not present

## 2019-10-14 DIAGNOSIS — Z87440 Personal history of urinary (tract) infections: Secondary | ICD-10-CM | POA: Insufficient documentation

## 2019-10-14 DIAGNOSIS — Z809 Family history of malignant neoplasm, unspecified: Secondary | ICD-10-CM | POA: Insufficient documentation

## 2019-10-14 DIAGNOSIS — Z881 Allergy status to other antibiotic agents status: Secondary | ICD-10-CM | POA: Diagnosis not present

## 2019-10-14 DIAGNOSIS — K219 Gastro-esophageal reflux disease without esophagitis: Secondary | ICD-10-CM | POA: Insufficient documentation

## 2019-10-14 DIAGNOSIS — J45909 Unspecified asthma, uncomplicated: Secondary | ICD-10-CM | POA: Insufficient documentation

## 2019-10-14 DIAGNOSIS — Z87891 Personal history of nicotine dependence: Secondary | ICD-10-CM | POA: Insufficient documentation

## 2019-10-14 DIAGNOSIS — G5603 Carpal tunnel syndrome, bilateral upper limbs: Secondary | ICD-10-CM | POA: Diagnosis not present

## 2019-10-14 DIAGNOSIS — Z833 Family history of diabetes mellitus: Secondary | ICD-10-CM | POA: Insufficient documentation

## 2019-10-14 DIAGNOSIS — E785 Hyperlipidemia, unspecified: Secondary | ICD-10-CM | POA: Diagnosis not present

## 2019-10-14 HISTORY — PX: BILATERAL CARPAL TUNNEL RELEASE: SHX6508

## 2019-10-14 HISTORY — DX: Gastro-esophageal reflux disease without esophagitis: K21.9

## 2019-10-14 SURGERY — BILATERAL CARPAL TUNNEL RELEASE
Anesthesia: Monitor Anesthesia Care | Site: Wrist | Laterality: Bilateral

## 2019-10-14 MED ORDER — LACTATED RINGERS IV SOLN: INTRAVENOUS | Status: DC

## 2019-10-14 MED ORDER — SUCCINYLCHOLINE CHLORIDE 200 MG/10ML IV SOSY
PREFILLED_SYRINGE | INTRAVENOUS | Status: AC
  Filled 2019-10-14: qty 10

## 2019-10-14 MED ORDER — ONDANSETRON HCL 4 MG/2ML IJ SOLN: 4.0000 mg | Freq: Once | INTRAMUSCULAR | Status: DC | PRN

## 2019-10-14 MED ORDER — PHENYLEPHRINE 40 MCG/ML (10ML) SYRINGE FOR IV PUSH (FOR BLOOD PRESSURE SUPPORT)
PREFILLED_SYRINGE | INTRAVENOUS | Status: AC
  Filled 2019-10-14: qty 10

## 2019-10-14 MED ORDER — PROPOFOL 10 MG/ML IV BOLUS
INTRAVENOUS | Status: DC | PRN
  Administered 2019-10-14: 200 mg via INTRAVENOUS

## 2019-10-14 MED ORDER — VANCOMYCIN HCL IN DEXTROSE 1-5 GM/200ML-% IV SOLN
1000.0000 mg | INTRAVENOUS | Status: AC
  Administered 2019-10-14: 1000 mg via INTRAVENOUS

## 2019-10-14 MED ORDER — DEXAMETHASONE SODIUM PHOSPHATE 10 MG/ML IJ SOLN
INTRAMUSCULAR | Status: DC | PRN
  Administered 2019-10-14: 10 mg via INTRAVENOUS

## 2019-10-14 MED ORDER — EPHEDRINE 5 MG/ML INJ
INTRAVENOUS | Status: AC
  Filled 2019-10-14: qty 10

## 2019-10-14 MED ORDER — BUPIVACAINE HCL 0.25 % IJ SOLN
INTRAMUSCULAR | Status: DC | PRN
  Administered 2019-10-14: 10 mL

## 2019-10-14 MED ORDER — FENTANYL CITRATE (PF) 100 MCG/2ML IJ SOLN: 25.0000 ug | INTRAMUSCULAR | Status: DC | PRN

## 2019-10-14 MED ORDER — OXYCODONE HCL 5 MG/5ML PO SOLN: 5.0000 mg | Freq: Once | ORAL | Status: AC | PRN

## 2019-10-14 MED ORDER — MEPERIDINE HCL 25 MG/ML IJ SOLN: 6.2500 mg | INTRAMUSCULAR | Status: DC | PRN

## 2019-10-14 MED ORDER — ACETAMINOPHEN 325 MG PO TABS: 325.0000 mg | ORAL_TABLET | ORAL | Status: DC | PRN

## 2019-10-14 MED ORDER — ACETAMINOPHEN 160 MG/5ML PO SOLN: 325.0000 mg | ORAL | Status: DC | PRN

## 2019-10-14 MED ORDER — FENTANYL CITRATE (PF) 100 MCG/2ML IJ SOLN
INTRAMUSCULAR | Status: AC
Start: 2019-10-14 — End: ?
  Filled 2019-10-14: qty 2

## 2019-10-14 MED ORDER — MIDAZOLAM HCL 2 MG/2ML IJ SOLN
INTRAMUSCULAR | Status: AC
  Filled 2019-10-14: qty 2

## 2019-10-14 MED ORDER — BUPIVACAINE HCL (PF) 0.25 % IJ SOLN
INTRAMUSCULAR | Status: AC
  Filled 2019-10-14: qty 30

## 2019-10-14 MED ORDER — LIDOCAINE HCL (PF) 1 % IJ SOLN
INTRAMUSCULAR | Status: DC | PRN
  Administered 2019-10-14: 10 mL

## 2019-10-14 MED ORDER — FENTANYL CITRATE (PF) 100 MCG/2ML IJ SOLN
INTRAMUSCULAR | Status: DC | PRN
Start: 2019-10-14 — End: 2019-10-14
  Administered 2019-10-14 (×2): 50 ug via INTRAVENOUS
  Administered 2019-10-14: 100 ug via INTRAVENOUS

## 2019-10-14 MED ORDER — ONDANSETRON HCL 4 MG/2ML IJ SOLN
INTRAMUSCULAR | Status: DC | PRN
  Administered 2019-10-14: 4 mg via INTRAVENOUS

## 2019-10-14 MED ORDER — LIDOCAINE HCL (PF) 1 % IJ SOLN
INTRAMUSCULAR | Status: AC
  Filled 2019-10-14: qty 30

## 2019-10-14 MED ORDER — ONDANSETRON HCL 4 MG/2ML IJ SOLN
INTRAMUSCULAR | Status: AC
  Filled 2019-10-14: qty 2

## 2019-10-14 MED ORDER — OXYCODONE HCL 5 MG PO TABS
ORAL_TABLET | ORAL | Status: AC
  Filled 2019-10-14: qty 1

## 2019-10-14 MED ORDER — OXYCODONE HCL 5 MG PO TABS
5.0000 mg | ORAL_TABLET | Freq: Once | ORAL | Status: AC | PRN
  Administered 2019-10-14: 5 mg via ORAL

## 2019-10-14 MED ORDER — LIDOCAINE 2% (20 MG/ML) 5 ML SYRINGE
INTRAMUSCULAR | Status: AC
  Filled 2019-10-14: qty 5

## 2019-10-14 MED ORDER — VANCOMYCIN HCL IN DEXTROSE 1-5 GM/200ML-% IV SOLN
INTRAVENOUS | Status: AC
  Filled 2019-10-14: qty 200

## 2019-10-14 MED ORDER — MIDAZOLAM HCL 5 MG/5ML IJ SOLN
INTRAMUSCULAR | Status: DC | PRN
  Administered 2019-10-14: 2 mg via INTRAVENOUS

## 2019-10-14 SURGICAL SUPPLY — 53 items
BLADE 15 SAFETY STRL DISP (BLADE) ×3 IMPLANT
BLADE SURG 15 STRL LF DISP TIS (BLADE) ×1 IMPLANT
BLADE SURG 15 STRL SS (BLADE) ×2
BNDG CMPR 9X4 STRL LF SNTH (GAUZE/BANDAGES/DRESSINGS) ×1
BNDG ELASTIC 3X5.8 VLCR STR LF (GAUZE/BANDAGES/DRESSINGS) ×3 IMPLANT
BNDG ESMARK 4X9 LF (GAUZE/BANDAGES/DRESSINGS) ×2 IMPLANT
BNDG GAUZE ELAST 4 BULKY (GAUZE/BANDAGES/DRESSINGS) ×2 IMPLANT
CORD BIPOLAR FORCEPS 12FT (ELECTRODE) ×1 IMPLANT
COVER BACK TABLE 60X90IN (DRAPES) ×2 IMPLANT
COVER MAYO STAND STRL (DRAPES) ×2 IMPLANT
COVER WAND RF STERILE (DRAPES) IMPLANT
CUFF TOURN SGL QUICK 18X4 (TOURNIQUET CUFF) ×2 IMPLANT
DRAPE EXTREMITY T 121X128X90 (DISPOSABLE) ×2 IMPLANT
DRAPE SURG 17X23 STRL (DRAPES) ×3 IMPLANT
GAUZE 4X4 16PLY RFD (DISPOSABLE) IMPLANT
GAUZE SPONGE 4X4 12PLY STRL (GAUZE/BANDAGES/DRESSINGS) ×1 IMPLANT
GAUZE SPONGE 4X4 12PLY STRL LF (GAUZE/BANDAGES/DRESSINGS) ×2 IMPLANT
GAUZE XEROFORM 1X8 LF (GAUZE/BANDAGES/DRESSINGS) ×3 IMPLANT
GLOVE BIO SURGEON STRL SZ 6.5 (GLOVE) ×2 IMPLANT
GLOVE BIO SURGEON STRL SZ8 (GLOVE) ×2 IMPLANT
GLOVE BIOGEL PI IND STRL 6.5 (GLOVE) ×1 IMPLANT
GLOVE BIOGEL PI IND STRL 8.5 (GLOVE) ×1 IMPLANT
GLOVE BIOGEL PI INDICATOR 6.5 (GLOVE) ×3
GLOVE BIOGEL PI INDICATOR 8.5 (GLOVE) ×1
GOWN STRL REUS W/ TWL LRG LVL3 (GOWN DISPOSABLE) ×1 IMPLANT
GOWN STRL REUS W/ TWL XL LVL3 (GOWN DISPOSABLE) ×1 IMPLANT
GOWN STRL REUS W/TWL LRG LVL3 (GOWN DISPOSABLE) ×2
GOWN STRL REUS W/TWL XL LVL3 (GOWN DISPOSABLE) ×2
KNIFE CARPAL TUNNEL (BLADE) IMPLANT
LOOP VESSEL MAXI BLUE (MISCELLANEOUS) IMPLANT
NDL HYPO 25X1 1.5 SAFETY (NEEDLE) ×2 IMPLANT
NDL SAFETY ECLIPSE 18X1.5 (NEEDLE) ×2 IMPLANT
NEEDLE HYPO 18GX1.5 SHARP (NEEDLE) ×4
NEEDLE HYPO 25X1 1.5 SAFETY (NEEDLE) ×4 IMPLANT
NS IRRIG 1000ML POUR BTL (IV SOLUTION) ×2 IMPLANT
PACK BASIN DAY SURGERY FS (CUSTOM PROCEDURE TRAY) ×2 IMPLANT
PAD ALCOHOL SWAB (MISCELLANEOUS) ×4 IMPLANT
PAD CAST 3X4 CTTN HI CHSV (CAST SUPPLIES) ×1 IMPLANT
PADDING CAST ABS 3INX4YD NS (CAST SUPPLIES)
PADDING CAST ABS 4INX4YD NS (CAST SUPPLIES) ×1
PADDING CAST ABS COTTON 3X4 (CAST SUPPLIES) IMPLANT
PADDING CAST ABS COTTON 4X4 ST (CAST SUPPLIES) ×1 IMPLANT
PADDING CAST COTTON 3X4 STRL (CAST SUPPLIES) ×4
SPLINT FIBERGLASS 3X35 (CAST SUPPLIES) ×1 IMPLANT
STOCKINETTE 4X48 STRL (DRAPES) ×2 IMPLANT
STOCKINETTE SYNTHETIC 3 UNSTER (CAST SUPPLIES) IMPLANT
SUT MNCRL AB 4-0 PS2 18 (SUTURE) ×1 IMPLANT
SUT PROLENE 4 0 PS 2 18 (SUTURE) ×3 IMPLANT
SYR BULB EAR ULCER 3OZ GRN STR (SYRINGE) ×2 IMPLANT
SYR CONTROL 10ML LL (SYRINGE) ×4 IMPLANT
TOWEL GREEN STERILE FF (TOWEL DISPOSABLE) ×2 IMPLANT
TRAY DSU PREP LF (CUSTOM PROCEDURE TRAY) ×3 IMPLANT
UNDERPAD 30X36 HEAVY ABSORB (UNDERPADS AND DIAPERS) ×3 IMPLANT

## 2019-10-14 NOTE — Transfer of Care (Signed)
Immediate Anesthesia Transfer of Care Note  Patient: Carrie Franco  Procedure(s) Performed: BILATERAL CARPAL TUNNEL RELEASE, right first dorsal compartment tenosynovectomy (Bilateral Wrist)  Patient Location: PACU  Anesthesia Type:General  Level of Consciousness: awake, alert , oriented and drowsy  Airway & Oxygen Therapy: Patient Spontanous Breathing and Patient connected to face mask oxygen  Post-op Assessment: Report given to RN and Post -op Vital signs reviewed and stable  Post vital signs: Reviewed and stable  Last Vitals:  Vitals Value Taken Time  BP    Temp    Pulse 78 10/14/19 1627  Resp 20 10/14/19 1627  SpO2 99 % 10/14/19 1627  Vitals shown include unvalidated device data.  Last Pain:  Vitals:   10/14/19 1213  TempSrc: Oral  PainSc: 10-Worst pain ever      Patients Stated Pain Goal: 10 (71/25/27 1292)  Complications: No complications documented.

## 2019-10-14 NOTE — Anesthesia Procedure Notes (Signed)
Procedure Name: LMA Insertion Date/Time: 10/14/2019 3:23 PM Performed by: Willa Frater, CRNA Pre-anesthesia Checklist: Patient identified, Emergency Drugs available, Suction available and Patient being monitored Patient Re-evaluated:Patient Re-evaluated prior to induction Oxygen Delivery Method: Circle system utilized Preoxygenation: Pre-oxygenation with 100% oxygen Induction Type: IV induction Ventilation: Mask ventilation without difficulty LMA: LMA inserted LMA Size: 4.0 Number of attempts: 1 Airway Equipment and Method: Bite block Placement Confirmation: positive ETCO2 Tube secured with: Tape Dental Injury: Teeth and Oropharynx as per pre-operative assessment

## 2019-10-14 NOTE — Anesthesia Postprocedure Evaluation (Signed)
Anesthesia Post Note  Patient: Carrie Franco  Procedure(s) Performed: BILATERAL CARPAL TUNNEL RELEASE, right first dorsal compartment tenosynovectomy (Bilateral Wrist)     Patient location during evaluation: PACU Anesthesia Type: MAC Level of consciousness: awake and alert Pain management: pain level controlled Vital Signs Assessment: post-procedure vital signs reviewed and stable Respiratory status: spontaneous breathing, nonlabored ventilation, respiratory function stable and patient connected to nasal cannula oxygen Cardiovascular status: stable and blood pressure returned to baseline Postop Assessment: no apparent nausea or vomiting Anesthetic complications: no   No complications documented.  Last Vitals:  Vitals:   10/14/19 1730 10/14/19 1745  BP: (!) 176/101 (!) 178/91  Pulse: 74 73  Resp: 16 16  Temp:  36.7 C  SpO2: 97% 98%    Last Pain:  Vitals:   10/14/19 1745  TempSrc:   PainSc: 8                  Yadiel Aubry

## 2019-10-14 NOTE — Op Note (Signed)
PREOPERATIVE DIAGNOSIS: Right hand carpal tunnel syndrome Right wrist first dorsal compartment tenosynovitis Left hand carpal tunnel syndrome  POSTOPERATIVE DIAGNOSIS: Same  ATTENDING SURGEON: Dr. Iran Planas who scrubbed and present for the entire procedure  ASSISTANT SURGEON: Gertie Fey who scrubbed and present and needed nerve release closure and splinting in a timely fashion  ANESTHESIA: General via laryngeal mask airway  OPERATIVE PROCEDURE: Left hand carpal tunnel release Right hand carpal tunnel release Right wrist first dorsal compartment tenosynovectomy Right wrist first dorsal compartment tendon sheath release  IMPLANTS: None  RADIOGRAPHIC INTERPRETATION: None  SURGICAL INDICATIONS: Patient is a right-hand-dominant female with bilateral carpal tunnel syndrome as well as right wrist tenosynovitis.  Patient elected undergo the above procedure.  The risks of surgery include but not limited to bleeding infection damage nearby nerves arteries or tendons loss of motion of the wrist and digits incomplete relief of symptoms and need for further surgical invention.  SURGICAL TECHNIQUE: Patient is palpated find the preoperative holding area marked for marker made on the right wrist and left wrist indicate correct operative site.  Patient brought back operating placed supine on the anesthesia table where the general anesthetic was administered.  Preoperative antibiotics were given prior to skin incision.  A well-padded tourniquet was placed on the left forearm and seal with the appropriate drape.  Left upper extremities then prepped and draped normal sterile fashion.  A timeout was called the correct site identified procedure then begun.  Several centimeter incision was then made in the mid palm after the upper extremities then prepped and draped in normal sterile fashion.  Dissection carried down to the skin subcutaneous tissue tourniquet insufflated.  Palmar fascia was incised  longitudinally.  Direct closure of the transverse carpal ligament was then carried out and under direct visualization the distal one half of the transverse carpal ligament released with a 15 blade.  Further exposure was then carried out proximally remaining portion the transverse carpal ligament as well as the antebrachial fascia was then released.  Protection of the median nerve was done throughout.  The contents of the carpal canal were then inspected and no other abnormalities noted.  The wound was then thoroughly irrigated.  Tourniquet deflated hemostasis obtained the skin was then closed using horizontal mattress Prolene sutures.  Attention was then turned to the right hand.  Timeout was called the correct site identified procedure then begun.  A several centimeter incision was then made in the mid palm after the upper extremity was then prepped and draped in normal sterile fashion.  Dissection carried down to the skin and subcutaneous tissue after the tourniquet had been insufflated.  The palmar fascia was incised longitudinally.  Direct exposure of the transverse carpal ligament was then done and released under direct visualization.  This was done with a 15 blade.  Further exposure was then carried out proximally for the remaining portion of the transverse carpal ligament as well as portion of the antebrachial fascia was then released.  Protection of the median nerve was done throughout.  The contents of the carpal canal were then inspected and no other abnormalities noted.  The wound was thoroughly irrigated.  Skin was then closed using horizontal mattress Prolene sutures.  Finally attention was then turned to the first dorsal compartment.  Through a separate incision a longitudinal incision made directly the first dorsal compartment.  Dissection carried down through the skin and subcutaneous tissue with the first dorsal compartment was then carefully identified.  Careful retraction was then done  of the  superficial branch of the radial nerve distribution.  First dorsal compartment was then opened up and released.  The patient did have a separate component of the EPB and multiple slips of the APL.  This was released all the way to the level of the radial styloid.  After release of the first dorsal compartment tenosynovectomy was then carried out.  The patient had a moderate inflammatory tissue of both tendons.  Tenosynovectomy was then carried out of the extensor compartment, first dorsal compartment.  The wound was thoroughly irrigated.  The contents of the first dorsal part was inspected and no other abnormalities were noted.  The wound was then thoroughly irrigated.  Obtains tissues were then closed with Monocryl and the skin closed with a running 4-0 Prolene suture.  Steri-Strips were applied sterile compressive bandage applied patient was then placed in a well-padded thumb spica splint extubated taken recovery in good condition.  POSTOPERATIVE PLAN: Patient is seen back in the office in 2 weeks for wound check suture removal transition to a gel flex on the left thumb spica brace on the right.  Gradual use and activity both hands.  See her back at the 6-week mark.  No radiographs at the first visit.

## 2019-10-14 NOTE — Discharge Instructions (Signed)
KEEP BANDAGE CLEAN AND DRY CALL OFFICE FOR F/U APPT [phone] IN 16 DAYS   KEEP HAND ELEVATED ABOVE HEART OK TO APPLY ICE TO OPERATIVE AREA CONTACT OFFICE IF ANY WORSENING PAIN OR CONCERNS.    Post Anesthesia Home Care Instructions  Activity: Get plenty of rest for the remainder of the day. A responsible individual must stay with you for 24 hours following the procedure.  For the next 24 hours, DO NOT: -Drive a car -Paediatric nurse -Drink alcoholic beverages -Take any medication unless instructed by your physician -Make any legal decisions or sign important papers.  Meals: Start with liquid foods such as gelatin or soup. Progress to regular foods as tolerated. Avoid greasy, spicy, heavy foods. If nausea and/or vomiting occur, drink only clear liquids until the nausea and/or vomiting subsides. Call your physician if vomiting continues.  Special Instructions/Symptoms: Your throat may feel dry or sore from the anesthesia or the breathing tube placed in your throat during surgery. If this causes discomfort, gargle with warm salt water. The discomfort should disappear within 24 hours.  If you had a scopolamine patch placed behind your ear for the management of post- operative nausea and/or vomiting:  1. The medication in the patch is effective for 72 hours, after which it should be removed.  Wrap patch in a tissue and discard in the trash. Wash hands thoroughly with soap and water. 2. You may remove the patch earlier than 72 hours if you experience unpleasant side effects which may include dry mouth, dizziness or visual disturbances. 3. Avoid touching the patch. Wash your hands with soap and water after contact with the patch.

## 2019-10-14 NOTE — Anesthesia Preprocedure Evaluation (Addendum)
Anesthesia Evaluation  Patient identified by MRN, date of birth, ID band Patient awake    Reviewed: Patient's Chart, lab work & pertinent test results  Airway Mallampati: II  TM Distance: >3 FB Neck ROM: Full    Dental  (+) Teeth Intact   Pulmonary asthma , former smoker,    Pulmonary exam normal        Cardiovascular hypertension, Pt. on medications  Rhythm:Regular Rate:Normal     Neuro/Psych Depression    GI/Hepatic Neg liver ROS, GERD  Medicated,  Endo/Other  negative endocrine ROS  Renal/GU negative Renal ROS   Genital herpes, recurrent bacterial vaginitis    Musculoskeletal  (+) Arthritis ,   Abdominal (+)  Abdomen: soft. Bowel sounds: normal.  Peds  Hematology   Anesthesia Other Findings   Reproductive/Obstetrics                            Anesthesia Physical Anesthesia Plan  ASA: II  Anesthesia Plan: MAC   Post-op Pain Management:    Induction:   PONV Risk Score and Plan: 2 and Propofol infusion, Ondansetron and Dexamethasone  Airway Management Planned: Simple Face Mask and Nasal Cannula  Additional Equipment: None  Intra-op Plan:   Post-operative Plan:   Informed Consent: I have reviewed the patients History and Physical, chart, labs and discussed the procedure including the risks, benefits and alternatives for the proposed anesthesia with the patient or authorized representative who has indicated his/her understanding and acceptance.     Dental advisory given  Plan Discussed with: CRNA  Anesthesia Plan Comments: (Covid-19 Nucleic Acid Test Results Lab Results      Component                Value               Date                      [place]              NEGATIVE            10/12/2019                [place]              Not Detected        08/18/2018          )        Anesthesia Quick Evaluation

## 2019-10-15 ENCOUNTER — Encounter (HOSPITAL_BASED_OUTPATIENT_CLINIC_OR_DEPARTMENT_OTHER): Payer: Self-pay | Admitting: Orthopedic Surgery

## 2019-10-15 NOTE — Anesthesia Postprocedure Evaluation (Signed)
Anesthesia Post Note  Patient: Carrie Franco  Procedure(s) Performed: BILATERAL CARPAL TUNNEL RELEASE, right first dorsal compartment tenosynovectomy (Bilateral Wrist)     Patient location during evaluation: PACU Anesthesia Type: General Level of consciousness: awake and alert Pain management: pain level controlled Vital Signs Assessment: post-procedure vital signs reviewed and stable Respiratory status: spontaneous breathing, nonlabored ventilation, respiratory function stable and patient connected to nasal cannula oxygen Cardiovascular status: blood pressure returned to baseline and stable Postop Assessment: no apparent nausea or vomiting Anesthetic complications: no   No complications documented.  Last Vitals:  Vitals:   10/14/19 1730 10/14/19 1745  BP: (!) 176/101 (!) 178/91  Pulse: 74 73  Resp: 16 16  Temp:  36.7 C  SpO2: 97% 98%    Last Pain:  Vitals:   10/14/19 1745  TempSrc:   PainSc: 8                  Lacie Landry

## 2019-10-15 NOTE — Addendum Note (Signed)
Addendum  created 10/15/19 1309 by Janeece Riggers, MD   Clinical Note Signed

## 2019-10-23 ENCOUNTER — Telehealth: Payer: Self-pay

## 2019-10-23 ENCOUNTER — Encounter: Payer: Self-pay | Admitting: Internal Medicine

## 2019-10-23 ENCOUNTER — Other Ambulatory Visit (HOSPITAL_COMMUNITY)
Admission: RE | Admit: 2019-10-23 | Discharge: 2019-10-23 | Disposition: A | Discharge status: Home / Self Care | Payer: 59 | Source: Ambulatory Visit | Attending: Internal Medicine | Admitting: Internal Medicine

## 2019-10-23 ENCOUNTER — Ambulatory Visit (INDEPENDENT_AMBULATORY_CARE_PROVIDER_SITE_OTHER): Payer: 59 | Admitting: Internal Medicine

## 2019-10-23 VITALS — BP 124/66 | HR 75 | Wt 208.0 lb

## 2019-10-23 DIAGNOSIS — B9689 Other specified bacterial agents as the cause of diseases classified elsewhere: Secondary | ICD-10-CM | POA: Diagnosis not present

## 2019-10-23 DIAGNOSIS — N898 Other specified noninflammatory disorders of vagina: Secondary | ICD-10-CM

## 2019-10-23 DIAGNOSIS — N760 Acute vaginitis: Secondary | ICD-10-CM

## 2019-10-23 MED ORDER — METRONIDAZOLE 500 MG PO TABS: 500.0000 mg | ORAL_TABLET | Freq: Two times a day (BID) | ORAL | 0 refills | Status: AC

## 2019-10-23 NOTE — Assessment & Plan Note (Addendum)
Patient presents with a 2 day history of white vaginal discharge with "fishy" odor. She states that she has a history of bacterial vaginosis and is concerned she is having another infection. I offered the patient a pelvic exam to better visualized her symptoms, but she decline. Considering the patient has had multiple episodes of BV over the course of this year, I have a high index of suspicion that this recurrent BV. I will give her a 7 day course of metronidazole. I offered her long-term topical treatment for recurrent BV, which she decline, stating that she does not like the way the topical treatments feel.   She denies dysuria, hematuria, fevers, chills, or myalgias. She admits to being in a 9 year monogamous relationship and denies any high risk sexual practices.   Plan: - Metronidazole 500 mg bid for 7 days - Cervical swab for BV self-administered swab.

## 2019-10-23 NOTE — Telephone Encounter (Signed)
OPEN ERROR
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
## 2019-10-23 NOTE — Patient Instructions (Signed)
Thank you, Carrie Franco for allowing [place] to provide your care today. Today we discussed bacterial vaginosis.    I have ordered the following labs for you:   Lab Orders     Urinalysis, Reflex Microscopic   I will call if any are abnormal. All of your labs can be accessed through "My Chart".  I have place a referrals to none.  I have ordered the following tests: none   I have ordered the following medication/changed the following medications:  1. begin Metronidazole 500 mg twice daily for 7 days  Please follow-up as needed.   Should you have any questions or concerns please call the internal medicine clinic at [phone].    Marianna Payment, D.O. [facility]

## 2019-10-24 ENCOUNTER — Encounter: Payer: Self-pay | Admitting: Internal Medicine

## 2019-10-24 LAB — MICROSCOPIC EXAMINATION: Casts: NONE SEEN /lpf

## 2019-10-24 LAB — URINALYSIS, ROUTINE W REFLEX MICROSCOPIC
Bilirubin, UA: NEGATIVE
Glucose, UA: NEGATIVE
Ketones, UA: NEGATIVE
Nitrite, UA: NEGATIVE
Protein,UA: NEGATIVE
RBC, UA: NEGATIVE
Specific Gravity, UA: 1.026 (ref 1.005–1.030)
Urobilinogen, Ur: 0.2 mg/dL (ref 0.2–1.0)
pH, UA: 5.5 (ref 5.0–7.5)

## 2019-10-26 LAB — CERVICOVAGINAL ANCILLARY ONLY
Bacterial Vaginitis (gardnerella): POSITIVE — AB
Candida Glabrata: NEGATIVE
Candida Vaginitis: NEGATIVE
Chlamydia: NEGATIVE
Comment: NEGATIVE
Comment: NEGATIVE
Comment: NEGATIVE
Comment: NEGATIVE
Comment: NEGATIVE
Comment: NORMAL
Neisseria Gonorrhea: NEGATIVE
Trichomonas: NEGATIVE

## 2019-10-26 NOTE — Progress Notes (Signed)
Internal Medicine Clinic Attending  Case discussed with Dr. Coe  At the time of the visit.  We reviewed the resident's history and exam and pertinent patient test results.  I agree with the assessment, diagnosis, and plan of care documented in the resident's note.  

## 2019-11-11 ENCOUNTER — Other Ambulatory Visit: Payer: Self-pay | Admitting: Internal Medicine

## 2019-11-11 DIAGNOSIS — L309 Dermatitis, unspecified: Secondary | ICD-10-CM

## 2019-11-24 ENCOUNTER — Telehealth: Payer: Self-pay | Admitting: *Deleted

## 2019-11-24 ENCOUNTER — Other Ambulatory Visit: Payer: Self-pay

## 2019-11-24 NOTE — Telephone Encounter (Signed)
pregabalin (LYRICA) 100 MG capsule, REFILL REQUEST @   [place], [place] Phone:  [phone]  Fax:  [phone]

## 2019-11-24 NOTE — Telephone Encounter (Signed)
Pt's pain mngment md refused to fill, dr patel at [facility]. Pt then ask pharmacy to send to dr seawell. Spoke w/ pharm, advised pt will need an appt to discuss this situation. Do you agree?

## 2019-11-24 NOTE — Telephone Encounter (Signed)
Yes, I agree with an appointment. It looks like it has always been filled by her pain management doctor. Thank you.

## 2019-11-25 ENCOUNTER — Telehealth: Payer: Self-pay
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
## 2019-11-25 NOTE — Telephone Encounter (Signed)
Per dr Sherry Ruffing advisement 11/9 called pt again today and offered appt, she will be seen 11/11 at [facility] Per pharmacy it has not been filled recently

## 2019-11-25 NOTE — Telephone Encounter (Signed)
Need refill on  pregabalin (LYRICA) 100 MG capsule ;pt contact  [place], [place]

## 2019-11-26 ENCOUNTER — Ambulatory Visit (INDEPENDENT_AMBULATORY_CARE_PROVIDER_SITE_OTHER): Payer: 59 | Admitting: Internal Medicine

## 2019-11-26 ENCOUNTER — Encounter: Payer: Self-pay | Admitting: Internal Medicine

## 2019-11-26 ENCOUNTER — Other Ambulatory Visit: Payer: Self-pay

## 2019-11-26 VITALS — BP 129/64 | HR 75 | Temp 98.7°F | Ht 65.0 in | Wt 209.3 lb

## 2019-11-26 DIAGNOSIS — I10 Essential (primary) hypertension: Secondary | ICD-10-CM

## 2019-11-26 DIAGNOSIS — M792 Neuralgia and neuritis, unspecified: Secondary | ICD-10-CM | POA: Insufficient documentation

## 2019-11-26 DIAGNOSIS — M5417 Radiculopathy, lumbosacral region: Secondary | ICD-10-CM

## 2019-11-26 HISTORY — DX: Neuralgia and neuritis, unspecified: M79.2

## 2019-11-26 MED ORDER — PREGABALIN 100 MG PO CAPS
100.0000 mg | ORAL_CAPSULE | Freq: Two times a day (BID) | ORAL | 5 refills | Status: DC
Start: 2019-11-26 — End: 2020-03-21

## 2019-11-26 NOTE — Patient Instructions (Addendum)
[name],  It was a pleasure to see you today. Thank you for coming in.   Today we discussed your burning foot pain. I have refilled your Lyrica to help with this pain. .   We also discussed your sensation on the back of your leg. This seems like a nerve issue, the Lyrica may help with this. You can also try some stretching exercises to see if this helps.    Your blood pressure looks good today! Continue your current medications.   Please return to clinic in 3 months or sooner if needed.   Thank you again for coming in.   [name].D.

## 2019-11-26 NOTE — Assessment & Plan Note (Signed)
  Patient reports that for the past 2 weeks she has noticed that when she rubs the back of her right thigh she will have a sensation of a line being drawn up to her back, she denies any pain with this.  She denies any weakness or recent falls however states that she does feel some imbalance on her right side when she walks.  She was concerned about a relation to her recent carpal tunnel surgery since she noticed it after the surgery.  On chart review patient has a history of right-sided lumbar radiculopathy.  This appears to be consistent with a radiculopathy.  Discussed that Lyrica and stretching may help with the sensation.  No further work-up at this time.

## 2019-11-26 NOTE — Assessment & Plan Note (Signed)
Currently on lisinopril 20 mg daily amlodipine 10 mg daily, denies any issues taking her medications.  Blood pressure well controlled at 129/64.  No change at this time.
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
## 2019-11-27 NOTE — Progress Notes (Signed)
Internal Medicine Clinic Attending ° °Case discussed with Dr. Krienke  At the time of the visit.  We reviewed the resident’s history and exam and pertinent patient test results.  I agree with the assessment, diagnosis, and plan of care documented in the resident’s note.  °

## 2019-12-16 ENCOUNTER — Encounter: Payer: 59 | Admitting: Internal Medicine

## 2019-12-16 ENCOUNTER — Other Ambulatory Visit: Payer: Self-pay | Admitting: *Deleted

## 2019-12-16 DIAGNOSIS — F331 Major depressive disorder, recurrent, moderate: Secondary | ICD-10-CM

## 2019-12-16 NOTE — Telephone Encounter (Signed)
Pt states that she was unaware of today's appt, but will reschedule to another day. Pt requesting refill on prozac

## 2019-12-17 MED ORDER — FLUOXETINE HCL 40 MG PO CAPS: 40.0000 mg | ORAL_CAPSULE | Freq: Every day | ORAL | 1 refills | Status: DC

## 2019-12-21 NOTE — Addendum Note (Signed)
Addended by: Molli Hazard A on: 12/21/2019 12:36 PM   Modules accepted: Orders

## 2019-12-25 ENCOUNTER — Other Ambulatory Visit: Payer: Self-pay | Admitting: Internal Medicine

## 2019-12-25 DIAGNOSIS — K219 Gastro-esophageal reflux disease without esophagitis: Secondary | ICD-10-CM

## 2019-12-29 ENCOUNTER — Other Ambulatory Visit: Payer: Self-pay

## 2019-12-29 DIAGNOSIS — L309 Dermatitis, unspecified: Secondary | ICD-10-CM

## 2019-12-30 MED ORDER — TRIAMCINOLONE ACETONIDE 0.1 % EX OINT: TOPICAL_OINTMENT | CUTANEOUS | 0 refills | Status: DC

## 2020-01-20 ENCOUNTER — Telehealth: Payer: Self-pay

## 2020-01-20 NOTE — Telephone Encounter (Signed)
Pls contact pt regarding pain medicine [phone]

## 2020-01-20 NOTE — Telephone Encounter (Signed)
Pt states she is having "muscle spasms' neck/shoulder. Has taken flexeril in the past for the same problem.  Understands she needs an appt and has been scheduled for 1/10-Pt requesting enough flexeril until her 1/10 appt with pcp. Will send to pcp/team for consideration.. please advise .Kingsley Spittle Cassady1/5/202212:26 PM

## 2020-01-21 MED ORDER — CYCLOBENZAPRINE HCL 5 MG PO TABS: 5.0000 mg | ORAL_TABLET | Freq: Two times a day (BID) | ORAL | 0 refills | Status: DC | PRN

## 2020-01-21 NOTE — Telephone Encounter (Signed)
I spoke with her and sent her a short course of Flexeril until we see her Monday.

## 2020-01-25 ENCOUNTER — Ambulatory Visit (INDEPENDENT_AMBULATORY_CARE_PROVIDER_SITE_OTHER): Payer: 59 | Admitting: Internal Medicine

## 2020-01-25 ENCOUNTER — Encounter: Payer: Self-pay | Admitting: Internal Medicine

## 2020-01-25 VITALS — BP 140/74 | HR 88 | Temp 98.4°F | Ht 63.0 in | Wt 214.0 lb

## 2020-01-25 DIAGNOSIS — M5417 Radiculopathy, lumbosacral region: Secondary | ICD-10-CM | POA: Diagnosis not present

## 2020-01-25 DIAGNOSIS — R222 Localized swelling, mass and lump, trunk: Secondary | ICD-10-CM
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
## 2020-01-25 NOTE — Patient Instructions (Signed)
Thank you, Ms.Orie Domingo Pulse for allowing [place] to provide your care today. Today we discussed Neck Swelling.    I have ordered the following labs for you:   Lab Orders     CBC with Diff   Tests ordered today:  CT scan of chest and neck   Referrals ordered today:   Referral Orders  No referral(s) requested today     I have ordered the following medication/changed the following medications:   Stop the following medications: There are no discontinued medications.   Start the following medications: No orders of the defined types were placed in this encounter.    Follow up: once images are done in 1-2 weeks    Remember: call us if your pain gets worse.   Should you have any questions or concerns please call the internal medicine clinic at [phone].     Marianna Payment, D.O. [place]

## 2020-01-26 ENCOUNTER — Telehealth: Payer: Self-pay | Admitting: *Deleted

## 2020-01-26 ENCOUNTER — Encounter: Payer: Self-pay | Admitting: Internal Medicine

## 2020-01-26 DIAGNOSIS — R222 Localized swelling, mass and lump, trunk: Secondary | ICD-10-CM

## 2020-01-26 HISTORY — DX: Localized swelling, mass and lump, trunk: R22.2

## 2020-01-26 LAB — CBC WITH DIFFERENTIAL/PLATELET
Basophils Absolute: 0 10*3/uL (ref 0.0–0.2)
Basos: 1 %
EOS (ABSOLUTE): 0.2 10*3/uL (ref 0.0–0.4)
Eos: 2 %
Hematocrit: 35.8 % (ref 34.0–46.6)
Hemoglobin: 11.6 g/dL (ref 11.1–15.9)
Immature Grans (Abs): 0 10*3/uL (ref 0.0–0.1)
Immature Granulocytes: 0 %
Lymphocytes Absolute: 2.3 10*3/uL (ref 0.7–3.1)
Lymphs: 26 %
MCH: 28.9 pg (ref 26.6–33.0)
MCHC: 32.4 g/dL (ref 31.5–35.7)
MCV: 89 fL (ref 79–97)
Monocytes Absolute: 0.5 10*3/uL (ref 0.1–0.9)
Monocytes: 6 %
Neutrophils Absolute: 5.7 10*3/uL (ref 1.4–7.0)
Neutrophils: 65 %
Platelets: 308 10*3/uL (ref 150–450)
RBC: 4.02 x10E6/uL (ref 3.77–5.28)
RDW: 14.1 % (ref 11.7–15.4)
WBC: 8.8 10*3/uL (ref 3.4–10.8)

## 2020-01-26 MED ORDER — CYCLOBENZAPRINE HCL 5 MG PO TABS: 5.0000 mg | ORAL_TABLET | Freq: Two times a day (BID) | ORAL | 0 refills | Status: AC | PRN

## 2020-01-26 NOTE — Assessment & Plan Note (Addendum)
Refilled patients Flexeril.  It appears that patient has been on this medication for chronic pain for several years.  Therefore, I will refill it today.  I think she would benefit from having a conversation regarding discontinuing this medication in the future.

## 2020-01-26 NOTE — Telephone Encounter (Signed)
Patient called in requesting lab results. Hubbard Hartshorn, BSN, RN-BC

## 2020-01-26 NOTE — Assessment & Plan Note (Signed)
Patient presents with bilateral supraclavicular fullness with evidence of swelling.  On physical exam there is no discrete mass or lymphadenopathy appreciable.  She does have some tenderness to palpation particularly over the left supraclavicular fossa that radiates to her left shoulder.  She does have full active and passive range of motion vascularly intact.  Patient is able to raise her arms overhead without evidence of thoracic outlet syndrome symptoms.   Unfortunately patient arrived with only 5 minutes remaining in her allotted appointment time, which limited further evaluation and management.  Considering her bilateral swelling supraclavicular swelling, I will get a dedicated neck and chest CT scan to assess for possible lymphadenopathy or signs of possible lymphoma.  Also get a CBC with differential to look for signs of hematologic abnormalities.  Plan: -CT scan of neck and chest without contrast -CBC with differential -Reevaluate in the near future once CT scans have been performed.

## 2020-01-28 NOTE — Telephone Encounter (Signed)
Pt is calling back checking on lab results pls contact pt [phone]

## 2020-01-28 NOTE — Telephone Encounter (Signed)
RTC, patient notified that labs were normal per Dr. Sammie Bench note on lab results.  Informed Dr. Marianna Payment would like her to make a f/u appt after she has her CT.  Patient upset because she has not heard anything from scheduling regarding CT date or time.   Will forward to [facility] to see if she can assist with this. Thank you, SChaplin, RN,BSN

## 2020-01-28 NOTE — Telephone Encounter (Signed)
Okay, let us know if there is anything for [place] to do.

## 2020-01-29 NOTE — Telephone Encounter (Signed)
FYI: This was already Authorized with [place] for the [facility] Radiology Department for the following day: 02/09/2020 for both CT Order's.    She was unhappy with waiting so long and resch her appointment to the [facility] in [facility]. I have contacted her Ins again and updated her requested Site with an Authorization for 02/03/2020 instead.

## 2020-02-02 ENCOUNTER — Other Ambulatory Visit: Payer: Self-pay

## 2020-02-02 ENCOUNTER — Encounter (HOSPITAL_COMMUNITY): Payer: Self-pay | Admitting: *Deleted

## 2020-02-02 ENCOUNTER — Emergency Department (HOSPITAL_COMMUNITY)
Admission: EM | Admit: 2020-02-02 | Discharge: 2020-02-02 | Disposition: A | Discharge status: Home / Self Care | Payer: 59 | Attending: Emergency Medicine | Admitting: Emergency Medicine

## 2020-02-02 ENCOUNTER — Emergency Department (HOSPITAL_COMMUNITY): Payer: 59

## 2020-02-02 DIAGNOSIS — I10 Essential (primary) hypertension: Secondary | ICD-10-CM | POA: Insufficient documentation

## 2020-02-02 DIAGNOSIS — R1900 Intra-abdominal and pelvic swelling, mass and lump, unspecified site: Secondary | ICD-10-CM | POA: Insufficient documentation

## 2020-02-02 DIAGNOSIS — Z79899 Other long term (current) drug therapy: Secondary | ICD-10-CM | POA: Diagnosis not present

## 2020-02-02 DIAGNOSIS — Z96659 Presence of unspecified artificial knee joint: Secondary | ICD-10-CM | POA: Diagnosis not present

## 2020-02-02 DIAGNOSIS — M542 Cervicalgia: Secondary | ICD-10-CM | POA: Diagnosis not present

## 2020-02-02 DIAGNOSIS — J4530 Mild persistent asthma, uncomplicated: Secondary | ICD-10-CM | POA: Insufficient documentation

## 2020-02-02 DIAGNOSIS — Z87891 Personal history of nicotine dependence: Secondary | ICD-10-CM | POA: Insufficient documentation

## 2020-02-02 LAB — COMPREHENSIVE METABOLIC PANEL
ALT: 17 U/L (ref 0–44)
AST: 16 U/L (ref 15–41)
Albumin: 4.1 g/dL (ref 3.5–5.0)
Alkaline Phosphatase: 103 U/L (ref 38–126)
Anion gap: 9 (ref 5–15)
BUN: 16 mg/dL (ref 6–20)
CO2: 25 mmol/L (ref 22–32)
Calcium: 9.1 mg/dL (ref 8.9–10.3)
Chloride: 107 mmol/L (ref 98–111)
Creatinine, Ser: 0.78 mg/dL (ref 0.44–1.00)
GFR, Estimated: >60 mL/min (ref >60)
Glucose, Bld: 107 mg/dL — ABNORMAL HIGH (ref 70–99)
Potassium: 3.9 mmol/L (ref 3.5–5.1)
Sodium: 141 mmol/L (ref 135–145)
Total Bilirubin: 0.4 mg/dL (ref 0.3–1.2)
Total Protein: 7.7 g/dL (ref 6.5–8.1)

## 2020-02-02 LAB — CBC WITH DIFFERENTIAL/PLATELET
Abs Immature Granulocytes: 0.03 10*3/uL (ref 0.00–0.07)
Basophils Absolute: 0 10*3/uL (ref 0.0–0.1)
Basophils Relative: 1 %
Eosinophils Absolute: 0.2 10*3/uL (ref 0.0–0.5)
Eosinophils Relative: 3 %
HCT: 38.5 % (ref 36.0–46.0)
Hemoglobin: 12.2 g/dL (ref 12.0–15.0)
Immature Granulocytes: 0 %
Lymphocytes Relative: 23 %
Lymphs Abs: 1.8 10*3/uL (ref 0.7–4.0)
MCH: 28.9 pg (ref 26.0–34.0)
MCHC: 31.7 g/dL (ref 30.0–36.0)
MCV: 91.2 fL (ref 80.0–100.0)
Monocytes Absolute: 0.5 10*3/uL (ref 0.1–1.0)
Monocytes Relative: 6 %
Neutro Abs: 5.2 10*3/uL (ref 1.7–7.7)
Neutrophils Relative %: 67 %
Platelets: 257 10*3/uL (ref 150–400)
RBC: 4.22 MIL/uL (ref 3.87–5.11)
RDW: 13.6 % (ref 11.5–15.5)
WBC: 7.7 10*3/uL (ref 4.0–10.5)
nRBC: 0 % (ref 0.0–0.2)

## 2020-02-02 LAB — LIPASE, BLOOD: Lipase: 24 U/L (ref 11–51)

## 2020-02-02 MED ORDER — IOHEXOL 300 MG/ML  SOLN
100.0000 mL | Freq: Once | INTRAMUSCULAR | Status: AC | PRN
  Administered 2020-02-02: 100 mL via INTRAVENOUS

## 2020-02-02 MED ORDER — METHOCARBAMOL 750 MG PO TABS: 750.0000 mg | ORAL_TABLET | Freq: Four times a day (QID) | ORAL | 0 refills | Status: DC

## 2020-02-02 MED ORDER — SODIUM CHLORIDE 0.9 % IV SOLN: INTRAVENOUS | Status: DC

## 2020-02-02 MED ORDER — MORPHINE SULFATE (PF) 4 MG/ML IV SOLN: 5.0000 mg | Freq: Once | INTRAVENOUS | Status: DC

## 2020-02-02 MED ORDER — OXYCODONE-ACETAMINOPHEN 5-325 MG PO TABS: 1.0000 | ORAL_TABLET | ORAL | 0 refills | Status: DC | PRN

## 2020-02-02 MED ORDER — LORAZEPAM 2 MG/ML IJ SOLN: 0.5000 mg | Freq: Once | INTRAMUSCULAR | Status: DC
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
## 2020-02-02 NOTE — Discharge Instructions (Addendum)
Call your primary care doctor today to schedule a follow-up visit. Return here for any trouble swallowing or breathing

## 2020-02-02 NOTE — ED Triage Notes (Signed)
Pt has swollen on left neck, rt shoulder pain that goes to abd. Severe pain, No N/V/D no fever or cough with symptoms.

## 2020-02-02 NOTE — Progress Notes (Signed)
Internal Medicine Clinic Attending  Case discussed with Dr. Coe  At the time of the visit.  We reviewed the resident's history and exam and pertinent patient test results.  I agree with the assessment, diagnosis, and plan of care documented in the resident's note.  

## 2020-02-02 NOTE — ED Notes (Signed)
Pt has discharge orders. Pt left before RN could discharge pt and go over discharge instructions. Pt A&Ox4 and ambulatory. Pt took out IV in room herself.

## 2020-02-03 ENCOUNTER — Ambulatory Visit (HOSPITAL_BASED_OUTPATIENT_CLINIC_OR_DEPARTMENT_OTHER): Payer: 59

## 2020-02-03 ENCOUNTER — Telehealth: Payer: Self-pay | Admitting: *Deleted

## 2020-02-03 ENCOUNTER — Ambulatory Visit (HOSPITAL_BASED_OUTPATIENT_CLINIC_OR_DEPARTMENT_OTHER): Admission: RE | Admit: 2020-02-03 | Payer: 59 | Source: Ambulatory Visit

## 2020-02-03 NOTE — Telephone Encounter (Signed)
Lets make her a follow up appointment for next week to further evaluate her pain.

## 2020-02-03 NOTE — Telephone Encounter (Signed)
Called pt to schedule an appt for next week per Dr Marianna Payment. Call transferred to front office - appt scheduled Wed 1/26 with Dr Lisabeth Devoid.

## 2020-02-03 NOTE — Telephone Encounter (Addendum)
Call from pt - stated she had CT scan done yesterday and was told it was negative. Stated she's still in a lot of pain- her neck and right arm. Trouble moving her arm. "Something is going on with me". She asked if her doctor would call her. Thanks

## 2020-02-04 NOTE — Telephone Encounter (Signed)
Great.  Thanks

## 2020-02-05 NOTE — Telephone Encounter (Signed)
Called patient regarding results. Patient has appointment next week to follow up on pain.

## 2020-02-05 NOTE — Telephone Encounter (Signed)
Pt states she is in pain, requesting CT scan result. Please call pt back.

## 2020-02-09 ENCOUNTER — Ambulatory Visit (HOSPITAL_COMMUNITY): Payer: 59

## 2020-02-10 ENCOUNTER — Encounter: Payer: 59 | Admitting: Student

## 2020-02-11 ENCOUNTER — Other Ambulatory Visit (HOSPITAL_COMMUNITY)
Admission: RE | Admit: 2020-02-11 | Discharge: 2020-02-11 | Disposition: A | Discharge status: Home / Self Care | Payer: 59 | Source: Ambulatory Visit | Attending: Internal Medicine | Admitting: Internal Medicine

## 2020-02-11 ENCOUNTER — Encounter: Payer: Self-pay | Admitting: Student

## 2020-02-11 ENCOUNTER — Ambulatory Visit (INDEPENDENT_AMBULATORY_CARE_PROVIDER_SITE_OTHER): Payer: 59 | Admitting: Student

## 2020-02-11 VITALS — BP 169/86 | HR 92 | Temp 98.1°F | Ht 63.0 in | Wt 214.2 lb

## 2020-02-11 DIAGNOSIS — R222 Localized swelling, mass and lump, trunk: Secondary | ICD-10-CM | POA: Diagnosis not present

## 2020-02-11 DIAGNOSIS — J309 Allergic rhinitis, unspecified: Secondary | ICD-10-CM

## 2020-02-11 DIAGNOSIS — N898 Other specified noninflammatory disorders of vagina: Secondary | ICD-10-CM | POA: Diagnosis not present

## 2020-02-11 DIAGNOSIS — F331 Major depressive disorder, recurrent, moderate: Secondary | ICD-10-CM | POA: Diagnosis not present

## 2020-02-11 DIAGNOSIS — M25511 Pain in right shoulder: Secondary | ICD-10-CM

## 2020-02-11 MED ORDER — CETIRIZINE HCL 10 MG PO TABS: 10.0000 mg | ORAL_TABLET | Freq: Every day | ORAL | 0 refills | Status: DC

## 2020-02-11 MED ORDER — DEXAMETHASONE 1 MG PO TABS: 1.0000 mg | ORAL_TABLET | Freq: Once | ORAL | 0 refills | Status: AC

## 2020-02-11 MED ORDER — FLUOXETINE HCL 40 MG PO CAPS: 40.0000 mg | ORAL_CAPSULE | Freq: Every day | ORAL | 1 refills | Status: DC

## 2020-02-11 NOTE — Patient Instructions (Addendum)
It was a pleasure seeing you in clinic. Today we discussed:   Shoulder and neck pain: I have ordered a dexamethasone suppression test. You need to make a lab only visit for the first appointment in the day. The night prior to getting labs take 1 mg dexamethasone at midnight. You must be on time for your appointment or results of the test will be invalid.  I have also ordered PT for your right shoulder.  Vaginal odor: I will call you with results of your tests  Follow up: lab only visit next week must be first appointment of the day  If you have any questions or concerns, please call our clinic at [phone] between 9am-5pm and after hours call [phone] and ask for the internal medicine resident on call. If you feel you are having a medical emergency please call 911.   Thank you, we look forward to helping you remain healthy!

## 2020-02-12 NOTE — Assessment & Plan Note (Signed)
Patient presents with 3 days of increased vaginal discharge with a fishy odor and reports history of BV and concerned about recurrence. Also concerned about STIs currently has 7 female partner who as another female partner, so she tried to consistently uses condoms. Denies fever, dysuria, hematuria, rash.  Plan  Self administered cervical swab for BV, GC/chlamydia  HIV at next lab visit

## 2020-02-12 NOTE — Assessment & Plan Note (Signed)
Patient presents for follow up of supraclavicular fullness bilaterally. Feels that this is getting bigger and continue to be painful. Also notes right shoulder pain that started at the same time as this 3 weeks ago. Denies numbness, paraesthesias, and trauma. On exam there is soft tissue swelling with pain to palpation of bilateral supraclavicular fossa. No lymphadenopathy, Sensation intact normal hand strength distal radial pulses 2+ bilaterally. Does have right shoulder pain with diminished ROM due to feeling of tightness and stiffness.  CT of chest and neck without acute abnormalities. Etiology of her supraclavicular fossa is unclear could be increased adiposity due to cushing's. Will order dexamethasone suppression test. Instructed her that she need to make a lab only appointment first thing in the morning after a dose of dexamethasone at midnight.  Right should pain likely due to adhesive capsulitis vs rotator cuff injury.  Plan Dexamethasone suppression test Referral PT
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
## 2020-02-15 LAB — CERVICOVAGINAL ANCILLARY ONLY
Bacterial Vaginitis (gardnerella): POSITIVE — AB
Candida Glabrata: NEGATIVE
Candida Vaginitis: NEGATIVE
Chlamydia: NEGATIVE
Comment: NEGATIVE
Comment: NEGATIVE
Comment: NEGATIVE
Comment: NEGATIVE
Comment: NEGATIVE
Comment: NORMAL
Neisseria Gonorrhea: NEGATIVE
Trichomonas: NEGATIVE

## 2020-02-16 ENCOUNTER — Encounter: Payer: 59 | Admitting: Student

## 2020-02-16 NOTE — Progress Notes (Signed)
Internal Medicine Clinic Attending  Case discussed with Dr. Lisabeth Devoid  At the time of the visit.  We reviewed the resident's history and exam and pertinent patient test results.  I agree with the assessment, diagnosis, and plan of care documented in the resident's note.   Patient has not yet presented for morning cortisol testing at the time of this note.

## 2020-02-17 ENCOUNTER — Ambulatory Visit (INDEPENDENT_AMBULATORY_CARE_PROVIDER_SITE_OTHER): Payer: 59 | Admitting: Student

## 2020-02-17 ENCOUNTER — Encounter: Payer: Self-pay | Admitting: Student

## 2020-02-17 ENCOUNTER — Telehealth: Payer: Self-pay | Admitting: *Deleted

## 2020-02-17 ENCOUNTER — Other Ambulatory Visit: Payer: Self-pay

## 2020-02-17 VITALS — BP 131/65 | HR 99 | Temp 98.4°F | Wt 215.7 lb

## 2020-02-17 DIAGNOSIS — I700 Atherosclerosis of aorta: Secondary | ICD-10-CM | POA: Diagnosis not present

## 2020-02-17 DIAGNOSIS — I10 Essential (primary) hypertension: Secondary | ICD-10-CM

## 2020-02-17 DIAGNOSIS — Z1211 Encounter for screening for malignant neoplasm of colon: Secondary | ICD-10-CM | POA: Diagnosis not present

## 2020-02-17 DIAGNOSIS — R222 Localized swelling, mass and lump, trunk: Secondary | ICD-10-CM

## 2020-02-17 DIAGNOSIS — J432 Centrilobular emphysema: Secondary | ICD-10-CM

## 2020-02-17 DIAGNOSIS — K5790 Diverticulosis of intestine, part unspecified, without perforation or abscess without bleeding: Secondary | ICD-10-CM | POA: Insufficient documentation

## 2020-02-17 DIAGNOSIS — Z86010 Personal history of colonic polyps: Secondary | ICD-10-CM

## 2020-02-17 DIAGNOSIS — N898 Other specified noninflammatory disorders of vagina: Secondary | ICD-10-CM

## 2020-02-17 DIAGNOSIS — J439 Emphysema, unspecified: Secondary | ICD-10-CM | POA: Insufficient documentation

## 2020-02-17 DIAGNOSIS — E785 Hyperlipidemia, unspecified: Secondary | ICD-10-CM

## 2020-02-17 HISTORY — DX: Diverticulosis of intestine, part unspecified, without perforation or abscess without bleeding: K57.90

## 2020-02-17 MED ORDER — DEXAMETHASONE 4 MG PO TABS: 8.0000 mg | ORAL_TABLET | Freq: Two times a day (BID) | ORAL | 0 refills | Status: DC

## 2020-02-17 MED ORDER — ROSUVASTATIN CALCIUM 5 MG PO TABS: 5.0000 mg | ORAL_TABLET | Freq: Every day | ORAL | 2 refills | Status: DC

## 2020-02-17 MED ORDER — METRONIDAZOLE 500 MG PO TABS: 500.0000 mg | ORAL_TABLET | Freq: Two times a day (BID) | ORAL | 0 refills | Status: DC

## 2020-02-17 MED ORDER — DEXAMETHASONE 1 MG PO TABS: 1.0000 mg | ORAL_TABLET | Freq: Once | ORAL | 0 refills | Status: DC

## 2020-02-17 NOTE — Assessment & Plan Note (Signed)
Patient presenting for follow up of supraclavicular fullness and pain bilaterally (L>R). Please see Dr. Youlanda Mighty note from 02/12/2020 for initial visit. She continues to report swelling and pain in both shoulders, but notes that left side seems worse than right side. Continues to deny numbness and paresthesias in bilateral arms. On exam, there is soft tissue swelling bilaterally (L>R) and patient is TTP in bilateral supraclavicular fossas. Sensation and motor function intact bilaterally. No lymphadenopathy or masses noted. ROM in shoulders is limited with abduction, extension, and flexion and produces some pain. Prior CT of chest and neck without acute abnormalities. She was supposed to get a dexamethasone suppression test done to assess for Cushing syndrome as patient's fullness/swelling could be from increased adipose deposition. We will repeat dexamethasone suppression test tonight with lab only visit tomorrow AM.  Plan: -8mg  dexamethasone suppression test

## 2020-02-17 NOTE — Patient Instructions (Addendum)
Ms Capurro,  It was a pleasure seeing you in the clinic today.   We discussed the following:  1. Please take dexamethasone between 11 PM - midnight tonight. Your lab visit will be tomorrow morning between 8:30 AM - 9 AM. Please get here by 8:20AM.  2. Please complete 7-day course of flagyl.  3. Start low dose Crestor (statin) for cholesterol.  4. Get over-the-counter vitamin D3 and take 1000-2000 units daily  5. I have placed the referral to GI for colonoscopy.  Please call our clinic at [phone] if you have any questions or concerns. The best time to call is Monday-Friday from 9am-4pm, but there is someone available 24/7 at the same number. If you need medication refills, please notify your pharmacy one week in advance and they will send [place] a request.   Thank you for letting us take part in your care. We look forward to seeing you in 2 weeks!

## 2020-02-17 NOTE — Assessment & Plan Note (Signed)
Patient with history of recurrent BV dating back to 54. Presented 1 week ago with 3-day history of increased vaginal discharge with a fishy odor. Self-administered cervical swab showing BV once again, otherwise negative. Will treat with flagyl 500mg  BID for 7 days.  Plan: -flagyl 500mg  BID for 7-day course

## 2020-02-17 NOTE — Assessment & Plan Note (Signed)
Patient due for colonoscopy given history of colonic polyps. Last one done in 2015, which showed diverticula and 5 sessile polyps (in sigmoid, descending, and ascending colons). She is interested in getting another colonoscopy done now so will refer to GI.  Plan: -ambulatory referral to GI -f/u colonoscopy findings

## 2020-02-17 NOTE — Telephone Encounter (Signed)
Pharmacist from [facility] called to clarify Rx for dexamethasone. Confirmed with Provider that patient is to take 8 mg total as a one time dose between 11 PM and midnight tonight. Relayed this info to [employer] at [facility]. Hubbard Hartshorn, BSN, RN-BC
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
## 2020-02-17 NOTE — Assessment & Plan Note (Signed)
Today's Vitals   02/17/20 0924 02/17/20 0934  BP: 131/65   Pulse: 99   Temp: 98.4 F (36.9 C)   TempSrc: Oral   SpO2: 97%   Weight: 215 lb 11.2 oz (97.8 kg)   PainSc:  8    Body mass index is 38.21 kg/m.  BP well controlled on lisinopril 20mg  qd and norvasc 10mg  qd  -continue current meds

## 2020-02-17 NOTE — Assessment & Plan Note (Signed)
Patient with history of adverse effects with statin medications in the past (mainly leg cramps). However, given comorbidities, she would benefit greatly with addition of statin. Will start at lowest dose of crestor (5mg  qd) and titrate up as tolerable. Will also supplement with Vitamin D3 as last level a couple of years ago was low (low vitamin D can also cause cramps).  Plan: -crestor 5mg  qd -vitamin D3 supplements OTC 1000-2000 units daily

## 2020-02-18 ENCOUNTER — Other Ambulatory Visit (INDEPENDENT_AMBULATORY_CARE_PROVIDER_SITE_OTHER): Payer: 59

## 2020-02-18 DIAGNOSIS — E785 Hyperlipidemia, unspecified: Secondary | ICD-10-CM

## 2020-02-18 DIAGNOSIS — N898 Other specified noninflammatory disorders of vagina: Secondary | ICD-10-CM

## 2020-02-18 DIAGNOSIS — R222 Localized swelling, mass and lump, trunk: Secondary | ICD-10-CM

## 2020-02-19 LAB — LIPID PANEL
Chol/HDL Ratio: 3.7 ratio (ref 0.0–4.4)
Cholesterol, Total: 252 mg/dL — ABNORMAL HIGH (ref 100–199)
HDL: 68 mg/dL (ref >39)
LDL Chol Calc (NIH): 175 mg/dL — ABNORMAL HIGH (ref 0–99)
Triglycerides: 55 mg/dL (ref 0–149)
VLDL Cholesterol Cal: 9 mg/dL (ref 5–40)

## 2020-02-19 LAB — CORTISOL: Cortisol: 0.6 ug/dL

## 2020-02-19 LAB — HIV ANTIBODY (ROUTINE TESTING W REFLEX): HIV Screen 4th Generation wRfx: NONREACTIVE

## 2020-02-19 NOTE — Progress Notes (Signed)
Internal Medicine Clinic Attending  Case discussed with Dr. Jinwala  At the time of the visit.  We reviewed the resident's history and exam and pertinent patient test results.  I agree with the assessment, diagnosis, and plan of care documented in the resident's note.  

## 2020-02-22 ENCOUNTER — Encounter: Payer: Self-pay | Admitting: *Deleted

## 2020-02-22 ENCOUNTER — Telehealth: Payer: Self-pay

## 2020-02-22 NOTE — Telephone Encounter (Signed)
Pls contact pt regarding results [phone]

## 2020-02-23 NOTE — Telephone Encounter (Signed)
Call from pt-"nobody has called me" wishes to discuss results of cortisol test.  Will send to appropriate team for review.Carrie Franco, Romyn Boswell Cassady2/8/20224:03 PM

## 2020-02-25 NOTE — Telephone Encounter (Signed)
Hey she has been notified of results. Will f/u with her at next office visit

## 2020-03-02 ENCOUNTER — Encounter: Payer: Self-pay | Admitting: *Deleted

## 2020-03-04 ENCOUNTER — Encounter: Payer: Self-pay | Admitting: Gastroenterology

## 2020-03-18 ENCOUNTER — Other Ambulatory Visit: Payer: Self-pay | Admitting: Internal Medicine

## 2020-03-18 DIAGNOSIS — K219 Gastro-esophageal reflux disease without esophagitis: Secondary | ICD-10-CM

## 2020-03-21 ENCOUNTER — Telehealth: Payer: Self-pay

## 2020-03-21 NOTE — Telephone Encounter (Signed)
Patient called in stating [place] for lyrica because Prescriber is not registered with [facility]. Patient states she has been out for 4-5 days. Will forward to Red Team to resend.

## 2020-03-21 NOTE — Telephone Encounter (Signed)
Error

## 2020-03-21 NOTE — Addendum Note (Signed)
Addended by: Hulan Fray on: 03/21/2020 05:51 PM   Modules accepted: Orders

## 2020-03-22 ENCOUNTER — Other Ambulatory Visit: Payer: Self-pay | Admitting: Internal Medicine

## 2020-03-22 MED ORDER — PREGABALIN 100 MG PO CAPS: 100.0000 mg | ORAL_CAPSULE | Freq: Two times a day (BID) | ORAL | 5 refills | Status: DC

## 2020-03-22 NOTE — Telephone Encounter (Signed)
Signed, thanks

## 2020-03-22 NOTE — Telephone Encounter (Signed)
Fax received from [facility] stating Dr. Dorothyann Peng DEA is not active with patient's Medicaid. Please resend Rx for pregabalin to [facility].

## 2020-03-22 NOTE — Telephone Encounter (Signed)
Where should I send the prescription?

## 2020-03-30 ENCOUNTER — Ambulatory Visit (INDEPENDENT_AMBULATORY_CARE_PROVIDER_SITE_OTHER): Payer: 59 | Admitting: Internal Medicine

## 2020-03-30 ENCOUNTER — Encounter: Payer: Self-pay | Admitting: Internal Medicine

## 2020-03-30 VITALS — BP 145/84 | HR 79 | Wt 222.3 lb

## 2020-03-30 DIAGNOSIS — Z8619 Personal history of other infectious and parasitic diseases: Secondary | ICD-10-CM

## 2020-03-30 DIAGNOSIS — R222 Localized swelling, mass and lump, trunk: Secondary | ICD-10-CM | POA: Diagnosis not present

## 2020-03-30 DIAGNOSIS — M542 Cervicalgia: Secondary | ICD-10-CM | POA: Diagnosis not present

## 2020-03-30 DIAGNOSIS — G8929 Other chronic pain: Secondary | ICD-10-CM

## 2020-03-30 MED ORDER — VALACYCLOVIR HCL 500 MG PO TABS: 500.0000 mg | ORAL_TABLET | Freq: Every day | ORAL | 3 refills | Status: DC

## 2020-03-30 NOTE — Assessment & Plan Note (Addendum)
Presented for f/u visit to address fullness. Concerned for adipose deposition regarding Cushing's on chart review but am cortisol wnl at 0.6. Physical exam shows increased volume due to trapezoid tonicity. No indication for further work-up. See under 'chronic neck pain' for further details
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
## 2020-03-30 NOTE — Assessment & Plan Note (Addendum)
Patient requesting refill of valacyclovir. Mentions recent outbreak. On valtrex for suppressive therapy. - Refilled valacyclovir

## 2020-03-30 NOTE — Patient Instructions (Addendum)
Dear [name],  Thank you for allowing [place] to provide your care today. Today we discussed your neck and shoulder pain    I have ordered no labs for you. I will call if any are abnormal.    Today I have made referral for physical therapy as well as sports medicine for you  Please follow-up as needed.    Please call the internal medicine center clinic if you have any questions or concerns, we may be able to help and keep you from a long and expensive emergency room wait. Our clinic and after hours phone number is [phone], the best time to call is Monday through Friday 9 am to 4 pm but there is always someone available 24/7 if you have an emergency. If you need medication refills please notify your pharmacy one week in advance and they will send [place] a request.    If you have not gotten the COVID vaccine, I recommend doing so:  You may get it at your local [facility] or [facility] OR To schedule an appointment for a COVID vaccine or be added to the vaccine wait list: Go to [email or web address]   OR Go to [email or web address]                  OR Call [phone]                                     OR Call [phone] and select Option 2  Thank you for choosing [facility]    Neck Exercises Ask your health care provider which exercises are safe for you. Do exercises exactly as told by your health care provider and adjust them as directed. It is normal to feel mild stretching, pulling, tightness, or discomfort as you do these exercises. Stop right away if you feel sudden pain or your pain gets worse. Do not begin these exercises until told by your health care provider. Neck exercises can be important for many reasons. They can improve strength and maintain flexibility in your neck, which will help your upper back and prevent neck pain. Stretching exercises Rotation neck stretching 1. Sit in a chair or stand up. 2. Place your feet flat on the floor, shoulder width  apart. 3. Slowly turn your head (rotate) to the right until a slight stretch is felt. Turn it all the way to the right so you can look over your right shoulder. Do not tilt or tip your head. 4. Hold this position for 10-30 seconds. 5. Slowly turn your head (rotate) to the left until a slight stretch is felt. Turn it all the way to the left so you can look over your left shoulder. Do not tilt or tip your head. 6. Hold this position for 10-30 seconds. Repeat __________ times. Complete this exercise __________ times a day.   Neck retraction 1. Sit in a sturdy chair or stand up. 2. Look straight ahead. Do not bend your neck. 3. Use your fingers to push your chin backward (retraction). Do not bend your neck for this movement. Continue to face straight ahead. If you are doing the exercise properly, you will feel a slight sensation in your throat and a stretch at the back of your neck. 4. Hold the stretch for 1-2 seconds. Repeat __________ times. Complete this exercise __________ times a day. Strengthening exercises Neck press 1. Lie on your back on a firm bed or  on the floor with a pillow under your head. 2. Use your neck muscles to push your head down on the pillow and straighten your spine. 3. Hold the position as well as you can. Keep your head facing up (in a neutral position) and your chin tucked. 4. Slowly count to 5 while holding this position. Repeat __________ times. Complete this exercise __________ times a day. Isometrics These are exercises in which you strengthen the muscles in your neck while keeping your neck still (isometrics). 1. Sit in a supportive chair and place your hand on your forehead. 2. Keep your head and face facing straight ahead. Do not flex or extend your neck while doing isometrics. 3. Push forward with your head and neck while pushing back with your hand. Hold for 10 seconds. 4. Do the sequence again, this time putting your hand against the back of your head. Use  your head and neck to push backward against the hand pressure. 5. Finally, do the same exercise on either side of your head, pushing sideways against the pressure of your hand. Repeat __________ times. Complete this exercise __________ times a day. Prone head lifts 1. Lie face-down (prone position), resting on your elbows so that your chest and upper back are raised. 2. Start with your head facing downward, near your chest. Position your chin either on or near your chest. 3. Slowly lift your head upward. Lift until you are looking straight ahead. Then continue lifting your head as far back as you can comfortably stretch. 4. Hold your head up for 5 seconds. Then slowly lower it to your starting position. Repeat __________ times. Complete this exercise __________ times a day. Supine head lifts 1. Lie on your back (supine position), bending your knees to point to the ceiling and keeping your feet flat on the floor. 2. Lift your head slowly off the floor, raising your chin toward your chest. 3. Hold for 5 seconds. Repeat __________ times. Complete this exercise __________ times a day. Scapular retraction 1. Stand with your arms at your sides. Look straight ahead. 2. Slowly pull both shoulders (scapulae) backward and downward (retraction) until you feel a stretch between your shoulder blades in your upper back. 3. Hold for 10-30 seconds. 4. Relax and repeat. Repeat __________ times. Complete this exercise __________ times a day. Contact a health care provider if:  Your neck pain or discomfort gets much worse when you do an exercise.  Your neck pain or discomfort does not improve within 2 hours after you exercise. If you have any of these problems, stop exercising right away. Do not do the exercises again unless your health care provider says that you can. Get help right away if:  You develop sudden, severe neck pain. If this happens, stop exercising right away. Do not do the exercises again  unless your health care provider says that you can. This information is not intended to replace advice given to you by your health care provider. Make sure you discuss any questions you have with your health care provider. Document Revised: 10/30/2017 Document Reviewed: 10/30/2017 Elsevier Patient Education  2021 [employer].

## 2020-04-01 ENCOUNTER — Encounter: Payer: Self-pay | Admitting: Physical Medicine and Rehabilitation

## 2020-04-04 NOTE — Progress Notes (Signed)
Internal Medicine Clinic Attending  Case discussed with Dr. Lee  At the time of the visit.  We reviewed the resident's history and exam and pertinent patient test results.  I agree with the assessment, diagnosis, and plan of care documented in the resident's note.    

## 2020-04-11 ENCOUNTER — Other Ambulatory Visit: Payer: Self-pay | Admitting: *Deleted

## 2020-04-11 DIAGNOSIS — J309 Allergic rhinitis, unspecified: Secondary | ICD-10-CM

## 2020-04-11 MED ORDER — CETIRIZINE HCL 10 MG PO TABS: 10.0000 mg | ORAL_TABLET | Freq: Every day | ORAL | 0 refills | Status: DC

## 2020-04-18 ENCOUNTER — Encounter: Payer: Self-pay | Admitting: Physical Medicine and Rehabilitation

## 2020-04-21 ENCOUNTER — Ambulatory Visit: Payer: Medicaid Other

## 2020-04-27 ENCOUNTER — Other Ambulatory Visit: Payer: Self-pay

## 2020-04-27 ENCOUNTER — Ambulatory Visit (AMBULATORY_SURGERY_CENTER): Payer: Self-pay | Admitting: *Deleted

## 2020-04-27 VITALS — Ht 63.0 in | Wt 217.0 lb

## 2020-04-27 DIAGNOSIS — Z1211 Encounter for screening for malignant neoplasm of colon: Secondary | ICD-10-CM

## 2020-04-27 NOTE — Progress Notes (Addendum)

## 2020-05-03 ENCOUNTER — Other Ambulatory Visit: Payer: Self-pay

## 2020-05-03 ENCOUNTER — Other Ambulatory Visit: Payer: Self-pay | Admitting: *Deleted

## 2020-05-03 MED ORDER — PREGABALIN 100 MG PO CAPS: 100.0000 mg | ORAL_CAPSULE | Freq: Two times a day (BID) | ORAL | 5 refills | Status: DC

## 2020-05-03 NOTE — Telephone Encounter (Signed)
Please call pt back regarding  pregabalin (LYRICA) 100 MG capsule. Please call pt back.

## 2020-05-03 NOTE — Telephone Encounter (Signed)
Pls contact pharmacy regarding medicine [phone]

## 2020-05-03 NOTE — Telephone Encounter (Signed)
I called [facility] who stated they need another Lyrica rx with doctor enroll in [place]. Last written 03/22/20 per Dr Rebeca Alert. Thanks

## 2020-05-09 ENCOUNTER — Telehealth: Payer: Self-pay | Admitting: Gastroenterology

## 2020-05-09 ENCOUNTER — Other Ambulatory Visit: Payer: Self-pay

## 2020-05-09 DIAGNOSIS — L309 Dermatitis, unspecified: Secondary | ICD-10-CM

## 2020-05-09 MED ORDER — TRIAMCINOLONE ACETONIDE 0.1 % EX OINT
TOPICAL_OINTMENT | CUTANEOUS | 0 refills | Status: DC
Start: 2020-05-09 — End: 2020-06-16

## 2020-05-09 NOTE — Telephone Encounter (Signed)
Answered all questions and went over prep instructions with the patient.

## 2020-05-09 NOTE — Telephone Encounter (Signed)
Pls contact pt [phone]

## 2020-05-09 NOTE — Telephone Encounter (Signed)
Inbound call from patient requesting a call from a nurse please.  Has additional questions about her prep.

## 2020-05-09 NOTE — Telephone Encounter (Signed)
  triamcinolone ointment (KENALOG) 0.1 %, REFILL REQUEST @  [facility], [place] Phone:  [phone]  Fax:  [phone]

## 2020-05-10 ENCOUNTER — Other Ambulatory Visit: Payer: Self-pay | Admitting: Internal Medicine

## 2020-05-10 DIAGNOSIS — Z1231 Encounter for screening mammogram for malignant neoplasm of breast: Secondary | ICD-10-CM

## 2020-05-12 ENCOUNTER — Encounter: Payer: Self-pay | Admitting: Gastroenterology

## 2020-05-12 ENCOUNTER — Ambulatory Visit (AMBULATORY_SURGERY_CENTER): Payer: Medicaid Other | Admitting: Gastroenterology

## 2020-05-12 ENCOUNTER — Other Ambulatory Visit: Payer: Self-pay

## 2020-05-12 VITALS — BP 141/75 | HR 70 | Temp 97.5°F | Resp 12 | Ht 63.0 in | Wt 217.0 lb

## 2020-05-12 DIAGNOSIS — D120 Benign neoplasm of cecum: Secondary | ICD-10-CM

## 2020-05-12 DIAGNOSIS — Z86010 Personal history of colonic polyps: Secondary | ICD-10-CM

## 2020-05-12 DIAGNOSIS — K635 Polyp of colon: Secondary | ICD-10-CM

## 2020-05-12 DIAGNOSIS — D123 Benign neoplasm of transverse colon: Secondary | ICD-10-CM | POA: Diagnosis not present

## 2020-05-12 DIAGNOSIS — D125 Benign neoplasm of sigmoid colon: Secondary | ICD-10-CM

## 2020-05-12 DIAGNOSIS — Z1211 Encounter for screening for malignant neoplasm of colon: Secondary | ICD-10-CM | POA: Diagnosis not present

## 2020-05-12 MED ORDER — SODIUM CHLORIDE 0.9 % IV SOLN: 500.0000 mL | Freq: Once | INTRAVENOUS | Status: DC

## 2020-05-12 NOTE — Progress Notes (Signed)
A/ox3, pleased with MAC, report to RN 

## 2020-05-12 NOTE — Patient Instructions (Signed)
.  Handout on polyps, diverticulosis given.    YOU HAD AN ENDOSCOPIC PROCEDURE TODAY AT THE [facility]:   Refer to the procedure report that was given to you for any specific questions about what was found during the examination.  If the procedure report does not answer your questions, please call your gastroenterologist to clarify.  If you requested that your care partner not be given the details of your procedure findings, then the procedure report has been included in a sealed envelope for you to review at your convenience later.  YOU SHOULD EXPECT: Some feelings of bloating in the abdomen. Passage of more gas than usual.  Walking can help get rid of the air that was put into your GI tract during the procedure and reduce the bloating. If you had a lower endoscopy (such as a colonoscopy or flexible sigmoidoscopy) you may notice spotting of blood in your stool or on the toilet paper. If you underwent a bowel prep for your procedure, you may not have a normal bowel movement for a few days.  Please Note:  You might notice some irritation and congestion in your nose or some drainage.  This is from the oxygen used during your procedure.  There is no need for concern and it should clear up in a day or so.  SYMPTOMS TO REPORT IMMEDIATELY:   Following lower endoscopy (colonoscopy or flexible sigmoidoscopy):  Excessive amounts of blood in the stool  Significant tenderness or worsening of abdominal pains  Swelling of the abdomen that is new, acute  Fever of 100F or higher   For urgent or emergent issues, a gastroenterologist can be reached at any hour by calling [phone]. Do not use MyChart messaging for urgent concerns.    DIET:  We do recommend a small meal at first, but then you may proceed to your regular diet.  Drink plenty of fluids but you should avoid alcoholic beverages for 24 hours.  ACTIVITY:  You should plan to take it easy for the rest of today and you should NOT  DRIVE or use heavy machinery until tomorrow (because of the sedation medicines used during the test).    FOLLOW UP: Our staff will call the number listed on your records 48-72 hours following your procedure to check on you and address any questions or concerns that you may have regarding the information given to you following your procedure. If we do not reach you, we will leave a message.  We will attempt to reach you two times.  During this call, we will ask if you have developed any symptoms of COVID 19. If you develop any symptoms (ie: fever, flu-like symptoms, shortness of breath, cough etc.) before then, please call [phone].  If you test positive for Covid 19 in the 2 weeks post procedure, please call and report this information to us.    If any biopsies were taken you will be contacted by phone or by letter within the next 1-3 weeks.  Please call us at [phone] if you have not heard about the biopsies in 3 weeks.    SIGNATURES/CONFIDENTIALITY: You and/or your care partner have signed paperwork which will be entered into your electronic medical record.  These signatures attest to the fact that that the information above on your After Visit Summary has been reviewed and is understood.  Full responsibility of the confidentiality of this discharge information lies with you and/or your care-partner. 

## 2020-05-12 NOTE — Progress Notes (Signed)
Called to room to assist during endoscopic procedure.  Patient ID and intended procedure confirmed with present staff. Received instructions for my participation in the procedure from the performing physician.  

## 2020-05-12 NOTE — Op Note (Signed)
[facility] Patient Name: Carrie Franco Procedure Date: 05/12/2020 9:12 AM MRN: [id number] Endoscopist: Remo Lipps P. Havery Moros , MD Age: 55 Referring MD:  Date of Birth: [birth year] Gender: Female Account #: [id number] Procedure:                Colonoscopy Indications:              High risk colon cancer surveillance: Personal                            history of colonic polyps (4 adenomas removed                            02/2013) Medicines:                Monitored Anesthesia Care Procedure:                Pre-Anesthesia Assessment:                           - Prior to the procedure, a History and Physical                            was performed, and patient medications and                            allergies were reviewed. The patient's tolerance of                            previous anesthesia was also reviewed. The risks                            and benefits of the procedure and the sedation                            options and risks were discussed with the patient.                            All questions were answered, and informed consent                            was obtained. Prior Anticoagulants: The patient has                            taken no previous anticoagulant or antiplatelet                            agents. ASA Grade Assessment: II - A patient with                            mild systemic disease. After reviewing the risks                            and benefits, the patient was deemed in  satisfactory condition to undergo the procedure.                           After obtaining informed consent, the colonoscope                            was passed under direct vision. Throughout the                            procedure, the patient's blood pressure, pulse, and                            oxygen saturations were monitored continuously. The                            Olympus PCF-H190DL ([id number]) Colonoscope was                             introduced through the anus and advanced to the the                            cecum, identified by appendiceal orifice and                            ileocecal valve. The colonoscopy was performed                            without difficulty. The patient tolerated the                            procedure well. The quality of the bowel                            preparation was adequate. The ileocecal valve,                            appendiceal orifice, and rectum were photographed. Scope In: 9:20:32 AM Scope Out: 9:42:35 AM Scope Withdrawal Time: 0 hours 18 minutes 33 seconds  Total Procedure Duration: 0 hours 22 minutes 3 seconds  Findings:                 The perianal and digital rectal examinations were                            normal.                           Two sessile polyps were found in the cecum. The                            polyps were 2 to 4 mm in size. These polyps were                            removed with a cold snare. Resection and retrieval  were complete.                           Two sessile polyps were found in the transverse                            colon. The polyps were 3 mm in size. These polyps                            were removed with a cold snare. Resection and                            retrieval were complete.                           Two sessile polyps were found in the sigmoid colon.                            The polyps were 3 to 4 mm in size. These polyps                            were removed with a cold snare. Resection and                            retrieval were complete.                           Scattered small-mouthed diverticula were found in                            the entire colon.                           A diffuse area of melanosis was found in the entire                            colon.                           The exam was otherwise without abnormality. Complications:             No immediate complications. Estimated blood loss:                            Minimal. Estimated Blood Loss:     Estimated blood loss was minimal. Impression:               - Two 2 to 4 mm polyps in the cecum, removed with a                            cold snare. Resected and retrieved.                           - Two 3 mm polyps in the transverse colon, removed  with a cold snare. Resected and retrieved.                           - Two 3 to 4 mm polyps in the sigmoid colon,                            removed with a cold snare. Resected and retrieved.                           - Diverticulosis in the entire examined colon.                           - Melanosis in the colon.                           - The examination was otherwise normal. Recommendation:           - Patient has a contact number available for                            emergencies. The signs and symptoms of potential                            delayed complications were discussed with the                            patient. Return to normal activities tomorrow.                            Written discharge instructions were provided to the                            patient.                           - Resume previous diet.                           - Continue present medications.                           - Await pathology results. Remo Lipps P. Havery Moros, MD 05/12/2020 9:48:46 AM This report has been signed electronically.

## 2020-05-16 ENCOUNTER — Telehealth: Payer: Self-pay

## 2020-05-16 ENCOUNTER — Telehealth: Payer: Self-pay | Admitting: *Deleted

## 2020-05-16 NOTE — Telephone Encounter (Signed)
  Follow up Call-  Call back number 05/12/2020  Post procedure Call Back phone  # [phone]  Permission to leave phone message Yes  Some recent data might be hidden     1st follow up call made.  NALM

## 2020-05-16 NOTE — Telephone Encounter (Signed)
  Follow up Call-  Call back number 05/12/2020  Post procedure Call Back phone  # [phone]  Permission to leave phone message Yes  Some recent data might be hidden     Patient questions:  Do you have a fever, pain , or abdominal swelling? No. Pain Score  0 *  Have you tolerated food without any problems? Yes.    Have you been able to return to your normal activities? Yes.    Do you have any questions about your discharge instructions: Diet   No. Medications  No. Follow up visit  No.  Do you have questions or concerns about your Care? No.  Actions: * If pain score is 4 or above: No action needed, pain <4.  1. Have you developed a fever since your procedure? no  2.   Have you had an respiratory symptoms (SOB or cough) since your procedure? no  3.   Have you tested positive for COVID 19 since your procedure no  4.   Have you had any family members/close contacts diagnosed with the COVID 19 since your procedure?  no   If yes to any of these questions please route to Joylene John, RN and Joella Prince, RN

## 2020-05-17 ENCOUNTER — Ambulatory Visit: Payer: 59 | Admitting: Physical Medicine and Rehabilitation
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
## 2020-05-19 ENCOUNTER — Encounter: Payer: Medicaid Other | Admitting: Internal Medicine

## 2020-05-19 DIAGNOSIS — I10 Essential (primary) hypertension: Secondary | ICD-10-CM

## 2020-05-26 DIAGNOSIS — M170 Bilateral primary osteoarthritis of knee: Secondary | ICD-10-CM | POA: Diagnosis not present

## 2020-05-26 DIAGNOSIS — M1711 Unilateral primary osteoarthritis, right knee: Secondary | ICD-10-CM | POA: Diagnosis not present

## 2020-06-01 ENCOUNTER — Encounter: Payer: Medicaid Other | Admitting: Physical Medicine and Rehabilitation

## 2020-06-01 NOTE — Addendum Note (Signed)
Addended by: Hulan Fray on: 06/01/2020 05:52 PM   Modules accepted: Orders

## 2020-06-16 ENCOUNTER — Encounter: Payer: Self-pay | Admitting: Internal Medicine

## 2020-06-16 ENCOUNTER — Ambulatory Visit (INDEPENDENT_AMBULATORY_CARE_PROVIDER_SITE_OTHER): Payer: Medicaid Other | Admitting: Internal Medicine

## 2020-06-16 DIAGNOSIS — R21 Rash and other nonspecific skin eruption: Secondary | ICD-10-CM

## 2020-06-16 DIAGNOSIS — L309 Dermatitis, unspecified: Secondary | ICD-10-CM | POA: Diagnosis not present

## 2020-06-16 MED ORDER — TRIAMCINOLONE ACETONIDE 0.1 % EX OINT: TOPICAL_OINTMENT | CUTANEOUS | 0 refills | Status: DC

## 2020-06-16 NOTE — Patient Instructions (Addendum)
To Carrie Franco,  It was a pleasure taking care of you today! Today we discussed your rash, I have put in for a steroid ointment for you to apply to your rash. Please apply this ointment two times daily. We will have you come back to the office in 4 weeks to reevaluate your rash.  Maudie Mercury, MD
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
## 2020-06-16 NOTE — Assessment & Plan Note (Addendum)
Patient presents today for a rash that has been present on the lateral aspect of her right thigh. She states that the rash and bumps have been present for a week and a half. She states that she is nervous that her rash could be shingles or a food allergy. They itch, but do not burn. She has been using alcohol swabs and tinactin to treat her rash with little improvement.   A/P:   Patient presenting with a rash on her R lateral thigh for the past week and a half. She has been using alcohol on the rash. Discussed stopping alcohol as that dries the skin out. Rash more consistent with contact dermatitis or possible arthropod bites than a fungal infection. Will start on medium potent steroid and reassess in 4 weeks time.  - Kenalog 0.1% ointment - reevaluate in 4 weeks.

## 2020-06-17 NOTE — Progress Notes (Signed)
Internal Medicine Clinic Attending ? ?Case discussed with Dr. Winters  At the time of the visit.  We reviewed the resident?s history and exam and pertinent patient test results.  I agree with the assessment, diagnosis, and plan of care documented in the resident?s note.  ?
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
## 2020-06-29 ENCOUNTER — Other Ambulatory Visit: Payer: Self-pay

## 2020-06-29 ENCOUNTER — Ambulatory Visit
Admission: RE | Admit: 2020-06-29 | Discharge: 2020-06-29 | Disposition: A | Discharge status: Home / Self Care | Payer: Medicaid Other | Source: Ambulatory Visit | Attending: Internal Medicine | Admitting: Internal Medicine

## 2020-06-29 DIAGNOSIS — Z1231 Encounter for screening mammogram for malignant neoplasm of breast: Secondary | ICD-10-CM

## 2020-06-30 ENCOUNTER — Other Ambulatory Visit: Payer: Self-pay

## 2020-06-30 ENCOUNTER — Encounter: Payer: Self-pay | Admitting: *Deleted

## 2020-06-30 DIAGNOSIS — K219 Gastro-esophageal reflux disease without esophagitis: Secondary | ICD-10-CM

## 2020-06-30 MED ORDER — PANTOPRAZOLE SODIUM 40 MG PO TBEC: 1.0000 | DELAYED_RELEASE_TABLET | Freq: Every day | ORAL | 0 refills | Status: DC

## 2020-06-30 NOTE — Telephone Encounter (Signed)
Pt is requesting her pantoprazole (PROTONIX) 40 MG tablet and amLODipine (NORVASC) 10 MG tablet sent to  [place], [place] Phone:  [phone]  Fax:  [phone]

## 2020-07-04 ENCOUNTER — Other Ambulatory Visit: Payer: Self-pay

## 2020-07-04 NOTE — Telephone Encounter (Signed)
cyclobenzaprine (FLEXERIL) 10 MG tablet, REFILL REQUEST @  [place], [place] Phone:  [phone]  Fax:  [phone]

## 2020-07-05 ENCOUNTER — Encounter: Payer: Self-pay | Admitting: Internal Medicine

## 2020-07-05 ENCOUNTER — Ambulatory Visit: Payer: Medicaid Other | Admitting: Internal Medicine

## 2020-07-05 VITALS — BP 133/76 | HR 67 | Wt 217.4 lb

## 2020-07-05 DIAGNOSIS — M542 Cervicalgia: Secondary | ICD-10-CM

## 2020-07-05 DIAGNOSIS — G8929 Other chronic pain: Secondary | ICD-10-CM | POA: Diagnosis not present

## 2020-07-05 MED ORDER — CYCLOBENZAPRINE HCL 10 MG PO TABS: 1.0000 | ORAL_TABLET | Freq: Three times a day (TID) | ORAL | 0 refills | Status: DC | PRN

## 2020-07-05 NOTE — Patient Instructions (Addendum)
To Ms. Saperstein,   It was a pleasure seeing you again! Today we discussed your neck and shoulder pain and your rash.   For your neck and shoulder pain, your Flexeril is waiting at the pharmacy. I would continue massaging and stretching your neck. I have placed another referral to pain management for your today.   I am also glad to hear that your rash has resolved.   We will see you back in 6 months, please reach out for an appointment if there is anything that we can address before hand.  Have a good day!  Maudie Mercury, MD

## 2020-07-06 ENCOUNTER — Encounter: Payer: Self-pay | Admitting: Internal Medicine
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
## 2020-07-19 ENCOUNTER — Encounter: Payer: Self-pay | Admitting: *Deleted

## 2020-07-19 NOTE — Progress Notes (Signed)
Internal Medicine Clinic Attending ? ?Case discussed with Dr. Winters  At the time of the visit.  We reviewed the resident?s history and exam and pertinent patient test results.  I agree with the assessment, diagnosis, and plan of care documented in the resident?s note.  ?

## 2020-08-08 ENCOUNTER — Ambulatory Visit: Payer: Medicaid Other | Admitting: Internal Medicine

## 2020-08-08 VITALS — BP 136/72 | HR 76 | Temp 98.2°F | Ht 63.0 in | Wt 216.1 lb

## 2020-08-08 DIAGNOSIS — M79622 Pain in left upper arm: Secondary | ICD-10-CM

## 2020-08-08 DIAGNOSIS — M79602 Pain in left arm: Secondary | ICD-10-CM | POA: Diagnosis not present

## 2020-08-08 HISTORY — DX: Pain in left upper arm: M79.622
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
## 2020-08-08 NOTE — Patient Instructions (Addendum)
Icey hot over the counter for arm pain relief

## 2020-08-08 NOTE — Assessment & Plan Note (Addendum)
Carrie Franco reports left arm pain for the last 3 months.  She denies any recent trauma or falls.  The pains begins at her shoulders and extends to her elbow.  The pain comes and goes throughout the day and worse at night.  Upon extension of her neck the pain is elicited and she describes it as a shooting pain down her arm stopping at the elbow.  Raising her arm above 90 degrees exacerbates the pain.  On physical exam deep palpation of the arm and upper neck elicits pain.  Passive motions of the arm elicits pain.  Changes in position of the neck elicits pain.  High suspicion for tense muscle spasms in the upper shoulder and upper arm.    PLAN: Referral to sports medicine, for muscular pain relief. Carrie Franco is currently on prescriptions for radiculopathy, continue regimen.

## 2020-08-09 DIAGNOSIS — G5602 Carpal tunnel syndrome, left upper limb: Secondary | ICD-10-CM | POA: Diagnosis not present

## 2020-08-09 DIAGNOSIS — M1812 Unilateral primary osteoarthritis of first carpometacarpal joint, left hand: Secondary | ICD-10-CM | POA: Diagnosis not present

## 2020-08-10 ENCOUNTER — Other Ambulatory Visit: Payer: Self-pay

## 2020-08-10 MED ORDER — CYCLOBENZAPRINE HCL 10 MG PO TABS: 10.0000 mg | ORAL_TABLET | Freq: Three times a day (TID) | ORAL | 0 refills | Status: DC | PRN

## 2020-08-10 NOTE — Telephone Encounter (Signed)
Requesting to speak with a nurse about getting refill on cyclobenzaprine (FLEXERIL) 10 MG tablet. Please call back.

## 2020-08-10 NOTE — Telephone Encounter (Signed)
Called pt - she stated Sports Med told her to continue taking Flexeril. Stated she will need a refill. Thanks

## 2020-08-12 ENCOUNTER — Other Ambulatory Visit: Payer: Self-pay

## 2020-08-12 ENCOUNTER — Ambulatory Visit: Payer: Medicaid Other | Admitting: Sports Medicine

## 2020-08-12 VITALS — BP 158/92 | Ht 63.0 in | Wt 215.0 lb

## 2020-08-12 DIAGNOSIS — M5412 Radiculopathy, cervical region: Secondary | ICD-10-CM | POA: Diagnosis not present

## 2020-08-12 MED ORDER — PREDNISONE 10 MG PO TABS: ORAL_TABLET | ORAL | 0 refills | Status: DC

## 2020-08-12 NOTE — Assessment & Plan Note (Signed)
Her history and exam are most consistent with cervical radiculopathy.  CT soft tissue of her neck was reviewed and does show signs of disc base narrowing worse at C4-5, 5-6, 6-7 which could correspond with her area of symptoms.  She has a pan positive shoulder exam that is rather nonspecific, therefore suspect that most of her pain is coming from her neck.  Given her significant symptoms and prior imaging without improvement with conservative treatments using anti-inflammatory, heat, massage, we will proceed with an MRI to further evaluate.  We will also give her a prednisone Dosepak to help with her pain.  She was advised to avoid NSAIDs while on prednisone.  She will follow-up as needed after the MRI.

## 2020-08-12 NOTE — Patient Instructions (Signed)
Thank you for coming to see me today. It was a pleasure. Today we talked about:   It seems that your pain is coming from your neck.  We will treat you with a steroid Dosepak.  You will take this for 6 days as directed using the paper that we gave you.  If you have pain outside of the steroid, you can take Tylenol 3, however you cannot take ibuprofen, Aleve, Advil, aspirin while you are taking the steroid.  We will order an MRI and follow-up with you after the results.  If you have any questions or concerns, please do not hesitate to call the office at [phone].  Best,   [place] Constable, DO [facility]

## 2020-08-12 NOTE — Progress Notes (Signed)
Carrie Franco is a 55 y.o. female who presents to [facility] today for the following:  Left Shoulder and Neck Pain Patient reports that sometime in early 2022 she had a booster shot for COVID, after that she noticed swelling in the supraclavicular region on the left, then on the right She was evaluated by her PCP had a CT scan She had not had any pain until about 1 month ago, when she started having pain in the left side of her neck and into her left shoulder She states that the pain is on the lateral aspect of her left shoulder radiates down to her elbow, also goes down into her left upper back/trapezius area She states that the pain worsens when she extends her neck She states that she has trouble adjusting her bra as well She reports that the pain is a constant ache all over Pain is also severe at night and she is been unable to sleep for the last 3 nights States that she is unable to lay on her shoulder due to pain Also finds that pain is worse with overhead activity She has used heating pad and Aleve as well as massage by her daughter without any improvement She has had no prior neck or shoulder injuries or surgeries She denies any numbness and tingling She does report that she has weakness in her left arm   PMH reviewed.  ROS as above. Medications reviewed.  Exam:  BP (!) 158/92   Ht '5\' 3"'$  (1.6 m)   Wt 215 lb (97.5 kg)   LMP 11/22/2012   BMI 38.09 kg/m  Gen: Well NAD MSK:  Neck/Back: - Inspection: no gross deformity or asymmetry, swelling or ecchymosis - Palpation: No TTP spinous process, TTP, diffuse tenderness to palpation of her left trapezius and rhomboid region - ROM: Neck flexion to about 30 degrees with pain, extension to 15 degrees with pain, full right side bending and rotation without pain, left sidebending to about 20 degrees and left rotation to about 20 degrees with pain - Strength: Slightly decreased strength in all fields of her bilateral upper extremity  strength testing that all correspond with pain.  OK sign, interosseus strength intact  - Neuro: sensation intact in the C5-C8 nerve root distribution b/l - Special testing: positive spurling's on left  Left Shoulder: Inspection reveals no obvious deformity, atrophy, or asymmetry b/l. No bruising. No swelling Palpation with diffuse tenderness to palpation along length of clavicle, AC joint, scapular spine, surrounding soft tissues Range of motion is limited in 160 degrees of flexion with pain, 90 degrees of abduction with pain.  Her internal rotation is limited to her hand at her buttocks, full external rotation, does have pain with this. NV intact distally b/l Special Tests:  - Impingement: Positive Hawkins - Supraspinatous: Positive empty can - Infraspinatous/Teres Minor: 4/5 strength with ER, with pain - Subscapularis: 4/5 strength with IR, with pain - Biceps tendon: Positive speeds, negative Yerrgason's  - Labrum: Negative Obriens, negative clunk, good stability - AC Joint: Negative cross arm   No results found.   Assessment and Plan: 1) Cervical radiculopathy Her history and exam are most consistent with cervical radiculopathy.  CT soft tissue of her neck was reviewed and does show signs of disc base narrowing worse at C4-5, 5-6, 6-7 which could correspond with her area of symptoms.  She has a pan positive shoulder exam that is rather nonspecific, therefore suspect that most of her pain is coming from her neck.  Given her significant symptoms and prior imaging without improvement with conservative treatments using anti-inflammatory, heat, massage, we will proceed with an MRI to further evaluate.  We will also give her a prednisone Dosepak to help with her pain.  She was advised to avoid NSAIDs while on prednisone.  She will follow-up as needed after the MRI.   [place] Constable, D.O.  PGY-4 [facility]  08/12/2020 11:11 AM

## 2020-08-16 ENCOUNTER — Other Ambulatory Visit: Payer: Self-pay | Admitting: Sports Medicine

## 2020-08-16 ENCOUNTER — Other Ambulatory Visit: Payer: Self-pay | Admitting: Internal Medicine

## 2020-08-16 MED ORDER — ACETAMINOPHEN-CODEINE #3 300-30 MG PO TABS: 1.0000 | ORAL_TABLET | Freq: Three times a day (TID) | ORAL | 0 refills | Status: DC | PRN

## 2020-08-16 NOTE — Telephone Encounter (Signed)
Patient's recorder requested detailed message be left. Explained she should still have 2 refills on pregabalin at [facility], and refill for ibuprofen was denied as not appropriate. Last refill on ibuprofen was June 2021. Patient will need an appt to discuss need for Rx now.

## 2020-08-16 NOTE — Telephone Encounter (Signed)
Refill Request- I pregabalin pregabalin (LYRICA) 100 MG capsule  Take 1 capsule (100 mg total) by mouth 2 (two) times daily., Starting Tue 05/03/2020, Normal  Dispense: 60 capsule  Refills: 5 ordered  Pharmacy: [facility], [place] (Ph: [phone])        buprofen ibuprofen (ADVIL) 800 MG tablet  Take 1 tablet (800 mg total) by mouth every 6 (six) hours as needed for fever or moderate pain., Starting Tue 06/30/2019, Normal  Dispense: 120 tablet  Refills: 1 ordered  Pharmacy: [facility], [place] (Ph: [phone])

## 2020-08-19 ENCOUNTER — Ambulatory Visit
Admission: RE | Admit: 2020-08-19 | Discharge: 2020-08-19 | Disposition: A | Discharge status: Home / Self Care | Payer: Medicaid Other | Source: Ambulatory Visit | Attending: Sports Medicine | Admitting: Sports Medicine

## 2020-08-19 ENCOUNTER — Other Ambulatory Visit: Payer: Self-pay

## 2020-08-19 DIAGNOSIS — E041 Nontoxic single thyroid nodule: Secondary | ICD-10-CM | POA: Diagnosis not present

## 2020-08-19 DIAGNOSIS — M5011 Cervical disc disorder with radiculopathy,  high cervical region: Secondary | ICD-10-CM | POA: Diagnosis not present

## 2020-08-19 DIAGNOSIS — M50122 Cervical disc disorder at C5-C6 level with radiculopathy: Secondary | ICD-10-CM | POA: Diagnosis not present

## 2020-08-19 DIAGNOSIS — M4722 Other spondylosis with radiculopathy, cervical region: Secondary | ICD-10-CM | POA: Diagnosis not present

## 2020-08-19 DIAGNOSIS — M5412 Radiculopathy, cervical region: Secondary | ICD-10-CM

## 2020-08-22 ENCOUNTER — Other Ambulatory Visit: Payer: Self-pay

## 2020-08-22 ENCOUNTER — Telehealth: Payer: Self-pay | Admitting: Sports Medicine

## 2020-08-22 ENCOUNTER — Other Ambulatory Visit: Payer: Self-pay | Admitting: Sports Medicine

## 2020-08-22 DIAGNOSIS — M5412 Radiculopathy, cervical region: Secondary | ICD-10-CM

## 2020-08-22 NOTE — Progress Notes (Signed)
Cathy from [facility] will be giving you a call to schedule an appt for an injection in your neck.  Please keep your appt with Dr. Ranell Patrick as this is the type of doctor that will continue your care. You can try and get a sooner appt with her by calling their office.

## 2020-08-22 NOTE — Telephone Encounter (Signed)
Patient notified via telephone today of MRI results of her cervical spine.  She has mild cervical spondylosis.  Minimal relative left neuroforaminal narrowing at C7-T1.  She continues to endorse significant left-sided shoulder and arm pain which is requiring narcotic pain medication.  She has an appointment with physical medicine and rehabilitation and I recommended that she try to expedite that appointment to see whether or not she may benefit from a diagnostic/therapeutic cervical ESI. Of note, mention is made by the radiologist of an incompletely imaged left thyroid lobe nodule.  Patient should follow-up with her PCP in regards to further work-up here.

## 2020-08-23 ENCOUNTER — Other Ambulatory Visit: Payer: Self-pay

## 2020-08-23 ENCOUNTER — Ambulatory Visit
Admission: RE | Admit: 2020-08-23 | Discharge: 2020-08-23 | Disposition: A | Discharge status: Home / Self Care | Payer: Medicaid Other | Source: Ambulatory Visit | Attending: Sports Medicine | Admitting: Sports Medicine

## 2020-08-23 ENCOUNTER — Other Ambulatory Visit: Payer: Self-pay | Admitting: Sports Medicine

## 2020-08-23 ENCOUNTER — Telehealth: Payer: Self-pay | Admitting: Sports Medicine

## 2020-08-23 DIAGNOSIS — M25512 Pain in left shoulder: Secondary | ICD-10-CM

## 2020-08-23 MED ORDER — OXYCODONE-ACETAMINOPHEN 5-325 MG PO TABS: 1.0000 | ORAL_TABLET | Freq: Four times a day (QID) | ORAL | 0 refills | Status: DC | PRN

## 2020-08-23 NOTE — Addendum Note (Signed)
Addended by: Jolinda Croak E on: 08/23/2020 09:31 AM   Modules accepted: Orders

## 2020-08-23 NOTE — Addendum Note (Signed)
Addended by: Jolinda Croak E on: 08/23/2020 09:21 AM   Modules accepted: Orders

## 2020-08-23 NOTE — Telephone Encounter (Signed)
I spoke with Carrie Franco on the phone today regarding her left-sided neck and shoulder pain.  She has a diagnostic/therapeutic cervical ESI scheduled but her pain is out of proportion with what we see on her MRI.  There is some possible concern for septic joint based on her degree of pain.  Therefore, I want to proceed with an x-ray of her left shoulder followed by an MRI specifically to rule out infection.  There is also been some concern in the past about lymphoma so the MRI is also needed to rule out shoulder pain secondary to cancer.  I will call her with those results when available.  We will attempt to expedite those studies.  In the meantime, I have agreed to give her a refill on her oxycodone.  I also explained to her that if her pain becomes intolerable and she should seek treatment in the emergency room.  She understands.

## 2020-08-27 ENCOUNTER — Other Ambulatory Visit: Payer: Self-pay

## 2020-08-27 ENCOUNTER — Ambulatory Visit
Admission: RE | Admit: 2020-08-27 | Discharge: 2020-08-27 | Disposition: A | Discharge status: Home / Self Care | Payer: Medicaid Other | Source: Ambulatory Visit | Attending: Sports Medicine | Admitting: Sports Medicine

## 2020-08-27 DIAGNOSIS — M25512 Pain in left shoulder: Secondary | ICD-10-CM

## 2020-08-27 DIAGNOSIS — M7552 Bursitis of left shoulder: Secondary | ICD-10-CM | POA: Diagnosis not present

## 2020-08-27 DIAGNOSIS — R2232 Localized swelling, mass and lump, left upper limb: Secondary | ICD-10-CM | POA: Diagnosis not present

## 2020-08-29 ENCOUNTER — Other Ambulatory Visit: Payer: Self-pay

## 2020-08-29 ENCOUNTER — Encounter
Payer: Medicaid Other | Attending: Physical Medicine and Rehabilitation | Admitting: Physical Medicine and Rehabilitation

## 2020-08-29 VITALS — BP 147/87 | HR 90 | Temp 98.8°F | Ht 63.0 in | Wt 222.1 lb

## 2020-08-29 DIAGNOSIS — G894 Chronic pain syndrome: Secondary | ICD-10-CM | POA: Diagnosis not present

## 2020-08-29 DIAGNOSIS — Z5181 Encounter for therapeutic drug level monitoring: Secondary | ICD-10-CM | POA: Diagnosis not present

## 2020-08-29 DIAGNOSIS — Z79891 Long term (current) use of opiate analgesic: Secondary | ICD-10-CM | POA: Insufficient documentation

## 2020-08-29 DIAGNOSIS — M5412 Radiculopathy, cervical region: Secondary | ICD-10-CM | POA: Diagnosis not present

## 2020-08-29 NOTE — Patient Instructions (Signed)
-  Discussed following foods that may reduce pain: 1) Ginger (especially studied for arthritis)- reduce leukotriene production to decrease inflammation 2) Blueberries- high in phytonutrients that decrease inflammation 3) Salmon- marine omega-3s reduce joint swelling and pain 4) Pumpkin seeds- reduce inflammation 5) dark chocolate- reduces inflammation 6) turmeric- reduces inflammation 7) tart cherries - reduce pain and stiffness 8) extra virgin olive oil - its compound olecanthal helps to block prostaglandins  9) chili peppers- can be eaten or applied topically via capsaicin 10) mint- helpful for headache, muscle aches, joint pain, and itching 11) garlic- reduces inflammation  Link to further information on diet for chronic pain: [email or web address]
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
## 2020-08-30 ENCOUNTER — Other Ambulatory Visit: Payer: Self-pay | Admitting: Internal Medicine

## 2020-08-30 MED ORDER — ALBUTEROL SULFATE HFA 108 (90 BASE) MCG/ACT IN AERS: 1.0000 | INHALATION_SPRAY | Freq: Every day | RESPIRATORY_TRACT | 2 refills | Status: DC | PRN

## 2020-08-30 NOTE — Telephone Encounter (Signed)
Refill Request  albuterol (VENTOLIN HFA) 108 (90 Base) MCG/ACT inhaler   [place] [place] (Ph: [phone])
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
## 2020-09-01 LAB — TOXASSURE SELECT,+ANTIDEPR,UR

## 2020-09-05 ENCOUNTER — Telehealth: Payer: Self-pay | Admitting: Internal Medicine

## 2020-09-05 ENCOUNTER — Telehealth: Payer: Self-pay | Admitting: *Deleted

## 2020-09-05 ENCOUNTER — Other Ambulatory Visit: Payer: Self-pay | Admitting: Physical Medicine and Rehabilitation

## 2020-09-05 ENCOUNTER — Ambulatory Visit: Payer: Medicaid Other | Admitting: Physical Medicine and Rehabilitation

## 2020-09-05 MED ORDER — OXYCODONE-ACETAMINOPHEN 5-325 MG PO TABS: 1.0000 | ORAL_TABLET | Freq: Four times a day (QID) | ORAL | 0 refills | Status: DC | PRN

## 2020-09-05 NOTE — Telephone Encounter (Signed)
Urine drug screen for this encounter is consistent for prescribed medication 

## 2020-09-05 NOTE — Telephone Encounter (Signed)
I agree. Thank you.

## 2020-09-05 NOTE — Telephone Encounter (Signed)
Pt reporting she has been coughing x 3 days when she goes to sleep. Pt states she is taking her Albuterol twice a day and is still coughing.  Pt requesting a call back.

## 2020-09-05 NOTE — Telephone Encounter (Signed)
Returned call to patient. States she started 3 days ago with dry cough only at night. Today she notes cough during the day as well. Also, notes "little SHOB" at rest and activity. Denies wheezing, chest tightness. Had been using albuterol MDI BID. Advised she may use it q 4-6 hours prn. Offered appt this afternoon. She prefers tomorrow AM. Appt given tomorrow at [phone]. She is advised to head to ED if [facility] increases at rest. She is in agreement.

## 2020-09-06 ENCOUNTER — Other Ambulatory Visit: Payer: Self-pay

## 2020-09-06 ENCOUNTER — Encounter: Payer: Self-pay | Admitting: Internal Medicine

## 2020-09-06 ENCOUNTER — Ambulatory Visit: Payer: Medicaid Other | Admitting: Internal Medicine

## 2020-09-06 ENCOUNTER — Encounter: Payer: Medicaid Other | Admitting: Internal Medicine

## 2020-09-06 VITALS — BP 142/79 | HR 92 | Temp 98.9°F | Resp 24 | Ht 63.0 in | Wt 216.1 lb

## 2020-09-06 DIAGNOSIS — R059 Cough, unspecified: Secondary | ICD-10-CM

## 2020-09-06 DIAGNOSIS — J069 Acute upper respiratory infection, unspecified: Secondary | ICD-10-CM | POA: Diagnosis not present

## 2020-09-06 MED ORDER — BENZONATATE 100 MG PO CAPS: 100.0000 mg | ORAL_CAPSULE | Freq: Three times a day (TID) | ORAL | 1 refills | Status: DC | PRN

## 2020-09-06 NOTE — Patient Instructions (Signed)
Thank you, Ms.Pamala Domingo Pulse for allowing [place] to provide your care today. Today we discussed cough and COVID screening.    Labs Ordered:  Lab Orders         Novel Coronavirus, NAA ([facility])      Tests Ordered: Orders Placed This Encounter  Procedures   Novel Coronavirus, NAA ([facility])      Referrals Ordered:  Referral Orders  No referral(s) requested today     Medication Changes:  There are no discontinued medications.   Meds ordered this encounter  Medications   benzonatate (TESSALON PERLES) 100 MG capsule    Sig: Take 1 capsule (100 mg total) by mouth 3 (three) times daily as needed for up to 14 days for cough.    Dispense:  30 capsule    Refill:  1     Health Maintenance Screening: There are no preventive care reminders to display for this patient.   Instructions:  - Please try to avoid contact with anyone until your result return - If you develop worsening shortness of breath, please call us immediately or go to the emergency room   Follow up:  as needed    Remember: If you have any questions or concerns, call our clinic at [phone] or after hours call [phone] and ask for the internal medicine resident on call.  Marianna Payment, D.O. [place]
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
## 2020-09-07 ENCOUNTER — Telehealth: Payer: Self-pay

## 2020-09-07 ENCOUNTER — Encounter: Payer: Self-pay | Admitting: Internal Medicine

## 2020-09-07 DIAGNOSIS — J069 Acute upper respiratory infection, unspecified: Secondary | ICD-10-CM

## 2020-09-07 HISTORY — DX: Acute upper respiratory infection, unspecified: J06.9

## 2020-09-07 LAB — NOVEL CORONAVIRUS, NAA: SARS-CoV-2, NAA: NOT DETECTED

## 2020-09-07 LAB — SARS-COV-2, NAA 2 DAY TAT

## 2020-09-07 NOTE — Assessment & Plan Note (Signed)
Patient presents with a 4-day history of nonproductive cough.  She states that she is having some night sweats but feels as though it may be related to her perimenopausal symptoms.  Denies any fevers, chest pain, shortness of breath.  She dates that she has been around at least one sick contact, stating that her grand son has had a cold.  Patient had 2 dose Pfizer vaccine and booster against COVID-19.  She is currently trying over-the-counter cold and flu medications with some success.  Plan: -We will prescribe Tessalon Perles for cough -Recommended continuing use of over-the-counter cold and flu medicine -We will test for COVID-19 today -Gave strict return precautions regarding shortness of breath

## 2020-09-07 NOTE — Telephone Encounter (Signed)
Requesting COVID test results, please call pt back.

## 2020-09-08 ENCOUNTER — Encounter (HOSPITAL_COMMUNITY): Payer: Self-pay | Admitting: Emergency Medicine

## 2020-09-08 ENCOUNTER — Emergency Department (HOSPITAL_COMMUNITY): Payer: Medicaid Other

## 2020-09-08 ENCOUNTER — Emergency Department (HOSPITAL_COMMUNITY)
Admission: EM | Admit: 2020-09-08 | Discharge: 2020-09-08 | Disposition: A | Discharge status: Home / Self Care | Payer: Medicaid Other | Attending: Emergency Medicine | Admitting: Emergency Medicine

## 2020-09-08 DIAGNOSIS — Z87891 Personal history of nicotine dependence: Secondary | ICD-10-CM | POA: Insufficient documentation

## 2020-09-08 DIAGNOSIS — Z86010 Personal history of colonic polyps: Secondary | ICD-10-CM | POA: Insufficient documentation

## 2020-09-08 DIAGNOSIS — J45901 Unspecified asthma with (acute) exacerbation: Secondary | ICD-10-CM | POA: Diagnosis not present

## 2020-09-08 DIAGNOSIS — Z79899 Other long term (current) drug therapy: Secondary | ICD-10-CM | POA: Insufficient documentation

## 2020-09-08 DIAGNOSIS — I10 Essential (primary) hypertension: Secondary | ICD-10-CM | POA: Diagnosis not present

## 2020-09-08 DIAGNOSIS — R059 Cough, unspecified: Secondary | ICD-10-CM | POA: Diagnosis not present

## 2020-09-08 MED ORDER — PREDNISONE 20 MG PO TABS
60.0000 mg | ORAL_TABLET | Freq: Once | ORAL | Status: AC
  Administered 2020-09-08: 60 mg via ORAL
  Filled 2020-09-08: qty 3

## 2020-09-08 MED ORDER — ALBUTEROL SULFATE (2.5 MG/3ML) 0.083% IN NEBU
2.5000 mg | INHALATION_SOLUTION | Freq: Once | RESPIRATORY_TRACT | Status: AC
  Administered 2020-09-08: 2.5 mg via RESPIRATORY_TRACT
  Filled 2020-09-08: qty 3

## 2020-09-08 MED ORDER — DEXTROMETHORPHAN HBR 15 MG/5ML PO SYRP: 10.0000 mL | ORAL_SOLUTION | Freq: Four times a day (QID) | ORAL | 0 refills | Status: DC | PRN

## 2020-09-08 MED ORDER — PREDNISONE 20 MG PO TABS: 40.0000 mg | ORAL_TABLET | Freq: Every day | ORAL | 0 refills | Status: AC

## 2020-09-08 NOTE — Discharge Instructions (Addendum)
Please take prednisone 40 mg daily for the next 5 days.  Please use your albuterol inhaler as needed.  Take dextromethorphan as needed for cough.

## 2020-09-08 NOTE — ED Triage Notes (Signed)
Patient complains of persistent cough since Monday this week. Went to primary care, had COVID NAA test which was negative and was prescribed tessalon and is taking with no relief. Reports pain with cough, denies other complaints.
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
## 2020-09-09 ENCOUNTER — Telehealth: Payer: Self-pay

## 2020-09-09 NOTE — Telephone Encounter (Signed)
Transition Care Management Unsuccessful Follow-up Telephone Call  Date of discharge and from where:  09/08/2020[facility]  Attempts:  1st Attempt  Reason for unsuccessful TCM follow-up call:  Unable to reach patient

## 2020-09-11 NOTE — Telephone Encounter (Signed)
Transition Care Management Unsuccessful Follow-up Telephone Call  Date of discharge and from where:  09/08/2020 from [facility]  Attempts:  2nd Attempt  Reason for unsuccessful TCM follow-up call:  Unable to reach patient

## 2020-09-12 NOTE — Telephone Encounter (Signed)
Transition Care Management Follow-up Telephone Call Date of discharge and from where: 09/08/2020 - [facility] How have you been since you were released from the hospital? "I am doing okay" Any questions or concerns? No  Items Reviewed: Did the pt receive and understand the discharge instructions provided? Yes  Medications obtained and verified? Yes  Other? No  Any new allergies since your discharge? No  Dietary orders reviewed? No Do you have support at home? Yes    Functional Questionnaire: (I = Independent and D = Dependent) ADLs: I  Bathing/Dressing- I  Meal Prep- I  Eating- I  Maintaining continence- I  Transferring/Ambulation- I  Managing Meds- I  Follow up appointments reviewed:  PCP Hospital f/u appt confirmed? No   Specialist Hospital f/u appt confirmed? No   Are transportation arrangements needed? No  If their condition worsens, is the pt aware to call PCP or go to the Emergency Dept.? Yes Was the patient provided with contact information for the PCP's office or ED? Yes Was to pt encouraged to call back with questions or concerns? Yes

## 2020-09-13 NOTE — Progress Notes (Signed)
Internal Medicine Clinic Attending  Case discussed with Dr. Coe  At the time of the visit.  We reviewed the resident's history and exam and pertinent patient test results.  I agree with the assessment, diagnosis, and plan of care documented in the resident's note.  

## 2020-09-13 NOTE — Progress Notes (Signed)
Internal Medicine Clinic Attending  I saw and evaluated the patient.  I personally confirmed the key portions of the history and exam documented by Dr. Ariwodo and I reviewed pertinent patient test results.  The assessment, diagnosis, and plan were formulated together and I agree with the documentation in the resident's note.   

## 2020-09-15 ENCOUNTER — Ambulatory Visit: Payer: Medicaid Other | Admitting: Physical Medicine and Rehabilitation

## 2020-09-20 ENCOUNTER — Other Ambulatory Visit: Payer: Self-pay | Admitting: Internal Medicine

## 2020-09-20 ENCOUNTER — Other Ambulatory Visit: Payer: Self-pay | Admitting: Student

## 2020-09-20 DIAGNOSIS — F331 Major depressive disorder, recurrent, moderate: Secondary | ICD-10-CM

## 2020-09-20 DIAGNOSIS — R059 Cough, unspecified: Secondary | ICD-10-CM

## 2020-09-20 DIAGNOSIS — K219 Gastro-esophageal reflux disease without esophagitis: Secondary | ICD-10-CM

## 2020-09-20 DIAGNOSIS — J069 Acute upper respiratory infection, unspecified: Secondary | ICD-10-CM

## 2020-09-22 ENCOUNTER — Ambulatory Visit (INDEPENDENT_AMBULATORY_CARE_PROVIDER_SITE_OTHER): Payer: Medicaid Other | Admitting: Internal Medicine

## 2020-09-22 ENCOUNTER — Encounter: Payer: Self-pay | Admitting: Internal Medicine

## 2020-09-22 DIAGNOSIS — I10 Essential (primary) hypertension: Secondary | ICD-10-CM | POA: Diagnosis not present

## 2020-09-22 DIAGNOSIS — K219 Gastro-esophageal reflux disease without esophagitis: Secondary | ICD-10-CM

## 2020-09-22 MED ORDER — LISINOPRIL 10 MG PO TABS: 20.0000 mg | ORAL_TABLET | Freq: Every day | ORAL | 3 refills | Status: DC

## 2020-09-22 MED ORDER — BENZONATATE 100 MG PO CAPS: 100.0000 mg | ORAL_CAPSULE | Freq: Three times a day (TID) | ORAL | 1 refills | Status: DC | PRN

## 2020-09-22 MED ORDER — AMLODIPINE BESYLATE 10 MG PO TABS: 10.0000 mg | ORAL_TABLET | Freq: Every day | ORAL | 3 refills | Status: DC
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
## 2020-09-22 NOTE — Assessment & Plan Note (Signed)
Patient requesting refill of her Protonix.  Protonix refilled.
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
## 2020-09-22 NOTE — Assessment & Plan Note (Signed)
Patient discussed with via telephone about needing her medication refilled.  Reports that she is consistently taking her medication and does not miss doses.  Denies any lightheadedness or dizziness.  Refilled amlodipine and lisinopril.

## 2020-09-22 NOTE — Patient Instructions (Signed)
Patient was evaluated via telephone interview for medication refill.  Patient reports good compliance and no side effects from medications.  Medications were refilled.

## 2020-09-22 NOTE — Assessment & Plan Note (Signed)
Patient was recently evaluated for cough and shortness of breath, was treated with over-the-counter medication as well as Best [employer].  She was initially thought to have viral illness and was COVID-negative.  Her shortness of breath did not seem to improve so she went to the emergency department on 8/25 2022 and was thought to have an asthma exacerbation.  Chest x-ray at that time showed no active cardiopulmonary disease.  She was given a dose of prednisone in the ED and discharged home with prednisone and at dextromethorphan for coughing.  She was also told that if her coughing persists to refill her Tessalon Perle medication.  Since that time her symptoms have mostly improved though she is still having a slight cough.  Cough is most predominant at night.  Sparse production of sputum though it is mostly white.  Shortness of breath has improved.  She is denying any fever chills chest pain neck stiffness nausea vomiting diarrhea numbness or weakness.  During telephone interview patient sounds alert and is able to speak in full sentences.  She does not sound short of breath while talking. Will refill Tessalon Perles prescription.

## 2020-09-23 ENCOUNTER — Telehealth: Payer: Self-pay

## 2020-09-23 NOTE — Telephone Encounter (Signed)
Returned call to patient. States she has had a left sided h/a x 3 days so she has been checking her BP all throughout the day. Highest has been 171/80. States she takes her amlodipine and lisinopril everyday in the AM without missing doses. Had tele appt ysterday for this. Scheduled in-person appt for 09/27/20 at 0845 with PCP. She is advised to head to ED if h/a worsens, develops vision changes, CP, SHOB, one sided weakness. She states agreement.

## 2020-09-23 NOTE — Telephone Encounter (Signed)
Requesting to speak with a nurse about bp 161/80. Please call pt back.

## 2020-09-26 NOTE — Telephone Encounter (Signed)
Agree with plan 

## 2020-09-27 ENCOUNTER — Ambulatory Visit: Payer: Medicaid Other | Admitting: Internal Medicine

## 2020-09-27 ENCOUNTER — Encounter: Payer: Self-pay | Admitting: Internal Medicine

## 2020-09-27 VITALS — BP 141/76 | HR 81 | Temp 98.8°F | Ht 63.0 in | Wt 216.5 lb

## 2020-09-27 DIAGNOSIS — E785 Hyperlipidemia, unspecified: Secondary | ICD-10-CM | POA: Diagnosis not present

## 2020-09-27 DIAGNOSIS — G8929 Other chronic pain: Secondary | ICD-10-CM

## 2020-09-27 DIAGNOSIS — I10 Essential (primary) hypertension: Secondary | ICD-10-CM | POA: Diagnosis not present

## 2020-09-27 DIAGNOSIS — R519 Headache, unspecified: Secondary | ICD-10-CM | POA: Diagnosis not present

## 2020-09-27 DIAGNOSIS — K219 Gastro-esophageal reflux disease without esophagitis: Secondary | ICD-10-CM

## 2020-09-27 DIAGNOSIS — L309 Dermatitis, unspecified: Secondary | ICD-10-CM | POA: Diagnosis not present

## 2020-09-27 DIAGNOSIS — M5417 Radiculopathy, lumbosacral region: Secondary | ICD-10-CM | POA: Diagnosis not present

## 2020-09-27 DIAGNOSIS — M542 Cervicalgia: Secondary | ICD-10-CM | POA: Diagnosis not present

## 2020-09-27 DIAGNOSIS — M5412 Radiculopathy, cervical region: Secondary | ICD-10-CM

## 2020-09-27 MED ORDER — OLMESARTAN MEDOXOMIL 40 MG PO TABS: 40.0000 mg | ORAL_TABLET | Freq: Every day | ORAL | 2 refills | Status: DC

## 2020-09-27 MED ORDER — 1ST MEDX-PATCH/ LIDOCAINE 4-0.025-5-20 % EX PTCH: 1.0000 | MEDICATED_PATCH | CUTANEOUS | 3 refills | Status: AC

## 2020-09-27 MED ORDER — CYCLOBENZAPRINE HCL 10 MG PO TABS: 10.0000 mg | ORAL_TABLET | Freq: Three times a day (TID) | ORAL | 0 refills | Status: DC | PRN

## 2020-09-27 MED ORDER — OLMESARTAN MEDOXOMIL 40 MG PO TABS: 40.0000 mg | ORAL_TABLET | Freq: Every day | ORAL | 3 refills | Status: DC

## 2020-09-27 MED ORDER — CARBAMAZEPINE 200 MG PO TABS
200.0000 mg | ORAL_TABLET | Freq: Two times a day (BID) | ORAL | 3 refills | Status: DC
Start: 2020-09-27 — End: 2022-02-01

## 2020-09-27 MED ORDER — TRIAMCINOLONE ACETONIDE 0.1 % EX OINT: TOPICAL_OINTMENT | CUTANEOUS | 0 refills | Status: DC

## 2020-09-27 MED ORDER — PRAVASTATIN SODIUM 40 MG PO TABS: 40.0000 mg | ORAL_TABLET | Freq: Every evening | ORAL | 3 refills | Status: DC

## 2020-09-27 NOTE — Assessment & Plan Note (Signed)
Assessment: Patient's has hyperlipidemia that is uncontrolled. Last lipid panel showed Cholesterol 252, LDL 175, HDL 68. Current medications include Crestor 5 mg that is not taking. Her 10 year ASCVD risk is 7.3 %.   Lipid Panel     Component Value Date/Time   CHOL 252 (H) 02/18/2020 0835   TRIG 55 02/18/2020 0835   HDL 68 02/18/2020 0835   CHOLHDL 3.7 02/18/2020 0835   CHOLHDL 3.8 02/17/2014 1543   VLDL 17 02/17/2014 1543   LDLCALC 175 (H) 02/18/2020 0835   LABVLDL 9 02/18/2020 0835   Plan: -Stop Crestor 5 mg daily -Start Pravachol 40 mg daily. If side effects develop then take every other day.  -Continue to make lifestyle modifications.
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
## 2020-09-27 NOTE — Progress Notes (Signed)
Internal Medicine Clinic Attending  I saw and evaluated the patient.  I personally confirmed the key portions of the history documented by Dr.  Elliot Gurney  and I reviewed pertinent patient test results.  The assessment, diagnosis, and plan were formulated together and I agree with the documentation in the resident's note.

## 2020-09-27 NOTE — Patient Instructions (Addendum)
Ms.Hasset Domingo Pulse, it was a pleasure seeing you today!  Today we discussed: You stated you were doing well. You endorsed a headache which is on the left side of your headache, and on your face. This appears similar to previous trigeminal neuralgia you reported. We will restart Carbamezapine. We will order a blood test and a CT scan. Your blood pressure was elevated in the clinic. You endorsed high readings at home. We will change you to losartan and increase to 40 mg. We will also refill your other prescriptions that you requested.   I have ordered the following labs today:  Lab Orders  No laboratory test(s) ordered today    Referrals ordered today:   Referral Orders  No referral(s) requested today     I have ordered the following medication/changed the following medications:   Stop the following medications: There are no discontinued medications.   Start the following medications: No orders of the defined types were placed in this encounter.    Follow-up: 3 months   Please make sure to arrive 15 minutes prior to your next appointment. If you arrive late, you may be asked to reschedule.   We look forward to seeing you next time. Please call our clinic at [phone] if you have any questions or concerns. The best time to call is Monday-Friday from 9am-4pm, but there is someone available 24/7. If after hours or the weekend, call the main hospital number and ask for the Internal Medicine Resident On-Call. If you need medication refills, please notify your pharmacy one week in advance and they will send [place] a request.  Thank you for letting us take part in your care. Wishing you the best!  Thank you, Idamae Schuller, MD

## 2020-09-27 NOTE — Assessment & Plan Note (Addendum)
Assessment: Patient has high blood pressure with clinic blood pressure reading being 141/76. She stated her home blood pressure is running anywhere from 140-150s/70-80s. To achieve goal blood pressure, I would change her lisinopril 20 mg to olmesartan 40 mg daily. Plan is to get controlled with this regimen and then use a combo pill for convenience.   Plan: -Discontinue lisinopril -Start Olmesartan 40 mg daily -Continue Amlodipine 10 mg daily

## 2020-09-28 ENCOUNTER — Encounter: Payer: Medicaid Other | Attending: Physical Medicine and Rehabilitation | Admitting: Registered Nurse

## 2020-09-28 ENCOUNTER — Other Ambulatory Visit: Payer: Self-pay

## 2020-09-28 ENCOUNTER — Encounter: Payer: Self-pay | Admitting: Registered Nurse

## 2020-09-28 VITALS — BP 138/84 | HR 87 | Temp 98.6°F | Ht 63.0 in | Wt 208.0 lb

## 2020-09-28 DIAGNOSIS — G894 Chronic pain syndrome: Secondary | ICD-10-CM

## 2020-09-28 DIAGNOSIS — Z79891 Long term (current) use of opiate analgesic: Secondary | ICD-10-CM

## 2020-09-28 DIAGNOSIS — M542 Cervicalgia: Secondary | ICD-10-CM | POA: Diagnosis not present

## 2020-09-28 DIAGNOSIS — M170 Bilateral primary osteoarthritis of knee: Secondary | ICD-10-CM | POA: Diagnosis not present

## 2020-09-28 DIAGNOSIS — Z5181 Encounter for therapeutic drug level monitoring: Secondary | ICD-10-CM | POA: Diagnosis not present

## 2020-09-28 DIAGNOSIS — M5412 Radiculopathy, cervical region: Secondary | ICD-10-CM | POA: Diagnosis not present

## 2020-09-28 LAB — SEDIMENTATION RATE: Sed Rate: 90 mm/hr — ABNORMAL HIGH (ref 0–40)
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
## 2020-10-02 ENCOUNTER — Encounter: Payer: Self-pay | Admitting: Internal Medicine

## 2020-10-02 DIAGNOSIS — R519 Headache, unspecified: Secondary | ICD-10-CM | POA: Insufficient documentation

## 2020-10-02 NOTE — Assessment & Plan Note (Addendum)
Assessment: Patient complained of a headache that has been present for couple of weeks. She stated it is on the left side and comes in the face at times. She described the pain as sharp. Ddx include trigeminal neuralgia vs Temporal arteritis.vs migraines vs cluster headaches vs TMJ  She denied any alarming findings such as positional aggravation, headache upon wakening up. She states it randomly comes and can be severe at times. She states it is similar to her previous headache that was on the right side. She was referring to her history of trigeminal neuralgia which responded to Carbamezapine.   Plan: -Obtain ESR, CT of Head -Restart Carbamezapine

## 2020-10-03 ENCOUNTER — Encounter: Payer: Self-pay | Admitting: Internal Medicine

## 2020-10-03 NOTE — Assessment & Plan Note (Signed)
Assessment: Patient has history of GERD that is well controlled with Protonix 40 mg daily.  Plan: -Continue Protonix 40 mg daily

## 2020-10-03 NOTE — Assessment & Plan Note (Signed)
Assessment: Patient has a history of eczema but has no current symptoms. It is well controlled. She is out of her Kenalog 0.1 %.  Plan: -Continue Kenalog as needed for eczema

## 2020-10-03 NOTE — Assessment & Plan Note (Signed)
Assessment: Patient has neck pain that responded to steroid injections by PMR. She denies any pain or stiffness in the neck. She would still like muscle relaxers for her neck pain.    Plan: -Short term Flexeril 10 mg TID 30 tablets TID

## 2020-10-07 ENCOUNTER — Telehealth: Payer: Self-pay

## 2020-10-07 NOTE — Telephone Encounter (Signed)
Received TC from patient, asking if she was to stop both her lisinopril and amlodipine.  Per LOV note w/ PCP, pt is to stop the lisinopril and was to start Olmesartan 40mg  daily, she states she did this.  Amlodipine 10mg  daily still listed on med list and she was instructed to continue this medication.  She states she has not been taking this but will start taking it today.  She c/o mild swelling to BLE.  Was instructed to elevate legs as much as possible and if she still notes swelling next week, to call back for an appt for evaluation.  She verbalized understanding. SChaplin, RN,BSN

## 2020-10-07 NOTE — Progress Notes (Signed)
Internal Medicine Clinic Attending  I saw and evaluated the patient.  I personally confirmed the key portions of the history and exam documented by Dr. Khan and I reviewed pertinent patient test results.  The assessment, diagnosis, and plan were formulated together and I agree with the documentation in the resident's note.  

## 2020-10-18 ENCOUNTER — Telehealth: Payer: Self-pay | Admitting: Physical Medicine and Rehabilitation

## 2020-10-18 MED ORDER — OXYCODONE-ACETAMINOPHEN 5-325 MG PO TABS: 1.0000 | ORAL_TABLET | Freq: Four times a day (QID) | ORAL | 0 refills | Status: DC | PRN

## 2020-10-18 NOTE — Telephone Encounter (Signed)
PMP was reviewed. Oxycodone e-scribed today.  Placed a call to Ms. Roye regarding the above, she verbalizes understanding.

## 2020-10-18 NOTE — Telephone Encounter (Signed)
Patient left voicemail on clinic line that she needed refill on medication, but didn't say what medication.  I tried calling patient to find out which medication, and her phone isn't accepting calls.

## 2020-10-21 ENCOUNTER — Telehealth: Payer: Self-pay | Admitting: Internal Medicine

## 2020-10-21 NOTE — Telephone Encounter (Signed)
Patient would like a call back for information.  Wants a contact person and/or telephone number on how to get a copy of covid vaccines done at [facility].  Forwarding to triage nurse.

## 2020-10-24 ENCOUNTER — Encounter: Payer: Medicaid Other | Admitting: Registered Nurse

## 2020-10-25 ENCOUNTER — Encounter: Payer: Medicaid Other | Attending: Physical Medicine and Rehabilitation | Admitting: Registered Nurse

## 2020-10-25 ENCOUNTER — Other Ambulatory Visit: Payer: Self-pay

## 2020-10-25 ENCOUNTER — Encounter: Payer: Self-pay | Admitting: Registered Nurse

## 2020-10-25 VITALS — BP 153/91 | HR 89 | Temp 98.3°F | Ht 63.0 in | Wt 221.0 lb

## 2020-10-25 DIAGNOSIS — G8929 Other chronic pain: Secondary | ICD-10-CM | POA: Diagnosis not present

## 2020-10-25 DIAGNOSIS — M170 Bilateral primary osteoarthritis of knee: Secondary | ICD-10-CM | POA: Diagnosis not present

## 2020-10-25 DIAGNOSIS — M5416 Radiculopathy, lumbar region: Secondary | ICD-10-CM | POA: Diagnosis not present

## 2020-10-25 DIAGNOSIS — G894 Chronic pain syndrome: Secondary | ICD-10-CM | POA: Insufficient documentation

## 2020-10-25 DIAGNOSIS — Z79891 Long term (current) use of opiate analgesic: Secondary | ICD-10-CM | POA: Diagnosis not present

## 2020-10-25 DIAGNOSIS — Z5181 Encounter for therapeutic drug level monitoring: Secondary | ICD-10-CM | POA: Diagnosis not present

## 2020-10-25 DIAGNOSIS — M545 Low back pain, unspecified: Secondary | ICD-10-CM | POA: Diagnosis not present
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
## 2020-10-26 ENCOUNTER — Ambulatory Visit
Admission: RE | Admit: 2020-10-26 | Discharge: 2020-10-26 | Disposition: A | Discharge status: Home / Self Care | Payer: Medicaid Other | Source: Ambulatory Visit | Attending: Registered Nurse | Admitting: Registered Nurse

## 2020-10-26 DIAGNOSIS — M545 Low back pain, unspecified: Secondary | ICD-10-CM | POA: Diagnosis not present

## 2020-10-27 ENCOUNTER — Encounter: Payer: Medicaid Other | Admitting: Internal Medicine

## 2020-10-27 NOTE — Addendum Note (Signed)
Addended by: Idamae Schuller on: 10/27/2020 04:49 PM   Modules accepted: Orders

## 2020-11-02 ENCOUNTER — Other Ambulatory Visit: Payer: Self-pay | Admitting: Internal Medicine

## 2020-11-02 DIAGNOSIS — G8929 Other chronic pain: Secondary | ICD-10-CM

## 2020-11-02 NOTE — Telephone Encounter (Signed)
Refill Request   pregabalin (LYRICA) 100 MG capsule   [facility], [place] (Ph: [phone])  Order Details

## 2020-11-08 MED ORDER — PREGABALIN 100 MG PO CAPS: 100.0000 mg | ORAL_CAPSULE | Freq: Two times a day (BID) | ORAL | 5 refills | Status: DC

## 2020-11-08 NOTE — Telephone Encounter (Signed)
pregabalin (LYRICA) 100 MG capsule, REFILL REQUEST @ [facility], [place].

## 2020-11-14 NOTE — Telephone Encounter (Signed)
cyclobenzaprine (FLEXERIL) 10 MG tablet, REFILL REQUEST @ [place], [place].

## 2020-11-15 ENCOUNTER — Encounter: Payer: Self-pay | Admitting: Registered Nurse

## 2020-11-15 ENCOUNTER — Other Ambulatory Visit: Payer: Self-pay

## 2020-11-15 ENCOUNTER — Encounter: Payer: Medicaid Other | Attending: Physical Medicine and Rehabilitation | Admitting: Registered Nurse

## 2020-11-15 VITALS — BP 147/80 | HR 82 | Ht 63.0 in | Wt 215.0 lb

## 2020-11-15 DIAGNOSIS — Z5181 Encounter for therapeutic drug level monitoring: Secondary | ICD-10-CM | POA: Diagnosis not present

## 2020-11-15 DIAGNOSIS — Z79891 Long term (current) use of opiate analgesic: Secondary | ICD-10-CM | POA: Diagnosis not present

## 2020-11-15 DIAGNOSIS — M170 Bilateral primary osteoarthritis of knee: Secondary | ICD-10-CM | POA: Diagnosis not present

## 2020-11-15 DIAGNOSIS — G894 Chronic pain syndrome: Secondary | ICD-10-CM | POA: Insufficient documentation

## 2020-11-15 DIAGNOSIS — G8929 Other chronic pain: Secondary | ICD-10-CM

## 2020-11-15 DIAGNOSIS — M5416 Radiculopathy, lumbar region: Secondary | ICD-10-CM | POA: Diagnosis not present

## 2020-11-15 DIAGNOSIS — M2550 Pain in unspecified joint: Secondary | ICD-10-CM | POA: Insufficient documentation

## 2020-11-15 MED ORDER — OXYCODONE-ACETAMINOPHEN 5-325 MG PO TABS: 1.0000 | ORAL_TABLET | Freq: Four times a day (QID) | ORAL | 0 refills | Status: DC | PRN
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
## 2020-11-21 ENCOUNTER — Telehealth: Payer: Self-pay | Admitting: Registered Nurse

## 2020-11-21 NOTE — Telephone Encounter (Signed)
Wants to know who the doctor was that gave her shots in the neck.

## 2020-11-21 NOTE — Telephone Encounter (Signed)
Dr Ranell Patrick notes was reviewed, she referred Ms. Pulley to Dr Suella Broad. Placed a call to Ms. Varin regarding the above, she will call Dr Nelva Bush she states.

## 2020-11-22 ENCOUNTER — Ambulatory Visit
Admission: RE | Admit: 2020-11-22 | Discharge: 2020-11-22 | Disposition: A | Discharge status: Home / Self Care | Payer: Medicaid Other | Source: Ambulatory Visit | Attending: Internal Medicine | Admitting: Internal Medicine

## 2020-11-22 ENCOUNTER — Other Ambulatory Visit: Payer: Self-pay

## 2020-11-22 ENCOUNTER — Ambulatory Visit: Payer: Medicaid Other | Admitting: Internal Medicine

## 2020-11-22 ENCOUNTER — Other Ambulatory Visit (HOSPITAL_COMMUNITY)
Admission: RE | Admit: 2020-11-22 | Discharge: 2020-11-22 | Disposition: A | Discharge status: Home / Self Care | Payer: Medicaid Other | Source: Ambulatory Visit | Attending: Internal Medicine | Admitting: Internal Medicine

## 2020-11-22 ENCOUNTER — Encounter: Payer: Self-pay | Admitting: Student

## 2020-11-22 VITALS — BP 134/75 | HR 74 | Temp 98.4°F | Ht 63.0 in | Wt 217.7 lb

## 2020-11-22 DIAGNOSIS — E785 Hyperlipidemia, unspecified: Secondary | ICD-10-CM

## 2020-11-22 DIAGNOSIS — M5412 Radiculopathy, cervical region: Secondary | ICD-10-CM | POA: Diagnosis not present

## 2020-11-22 DIAGNOSIS — Z0000 Encounter for general adult medical examination without abnormal findings: Secondary | ICD-10-CM

## 2020-11-22 DIAGNOSIS — R519 Headache, unspecified: Secondary | ICD-10-CM | POA: Diagnosis not present

## 2020-11-22 DIAGNOSIS — N888 Other specified noninflammatory disorders of cervix uteri: Secondary | ICD-10-CM | POA: Insufficient documentation

## 2020-11-22 DIAGNOSIS — I10 Essential (primary) hypertension: Secondary | ICD-10-CM

## 2020-11-22 DIAGNOSIS — J309 Allergic rhinitis, unspecified: Secondary | ICD-10-CM | POA: Diagnosis not present

## 2020-11-22 DIAGNOSIS — M5417 Radiculopathy, lumbosacral region: Secondary | ICD-10-CM

## 2020-11-22 DIAGNOSIS — N898 Other specified noninflammatory disorders of vagina: Secondary | ICD-10-CM | POA: Diagnosis not present

## 2020-11-22 MED ORDER — CETIRIZINE HCL 10 MG PO TABS: 10.0000 mg | ORAL_TABLET | Freq: Every day | ORAL | 0 refills | Status: DC

## 2020-11-22 NOTE — Patient Instructions (Addendum)
[name], it was a pleasure seeing you today!  Today we discussed: Discharge- you did a self-swab, I will call you with those results soon. You will need a pap smear in 2 years.   Blood work- Please come back for blood work. Continue taking pravastatin. The blood work is for cholesterol and kidney function.    I have ordered the following labs today:  Lab Orders         Lipid Profile         BMP8+Anion Gap        Referrals ordered today:   Referral Orders         Ambulatory referral to Sports Medicine       I have ordered the following medication/changed the following medications:   Stop the following medications: Medications Discontinued During This Encounter  Medication Reason   cetirizine (EQ ALLERGY RELIEF, CETIRIZINE,) 10 MG tablet Reorder     Start the following medications: Meds ordered this encounter  Medications   cetirizine (EQ ALLERGY RELIEF, CETIRIZINE,) 10 MG tablet    Sig: Take 1 tablet (10 mg total) by mouth daily.    Dispense:  90 tablet    Refill:  0     Follow-up: 3 months   Please make sure to arrive 15 minutes prior to your next appointment. If you arrive late, you may be asked to reschedule.   We look forward to seeing you next time. Please call our clinic at [phone] if you have any questions or concerns. The best time to call is Monday-Friday from 9am-4pm, but there is someone available 24/7. If after hours or the weekend, call the main hospital number and ask for the Internal Medicine Resident On-Call. If you need medication refills, please notify your pharmacy one week in advance and they will send [place] a request.  Thank you for letting us take part in your care. Wishing you the best!  Thank you, Dr. Hel[facility]
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
## 2020-11-23 DIAGNOSIS — T7840XA Allergy, unspecified, initial encounter: Secondary | ICD-10-CM | POA: Insufficient documentation

## 2020-11-23 LAB — CERVICOVAGINAL ANCILLARY ONLY
Bacterial Vaginitis (gardnerella): POSITIVE — AB
Candida Glabrata: POSITIVE — AB
Candida Vaginitis: NEGATIVE
Chlamydia: NEGATIVE
Comment: NEGATIVE
Comment: NEGATIVE
Comment: NEGATIVE
Comment: NEGATIVE
Comment: NEGATIVE
Comment: NORMAL
Neisseria Gonorrhea: NEGATIVE
Trichomonas: NEGATIVE

## 2020-11-23 NOTE — Assessment & Plan Note (Addendum)
Patient reports vaginal discharge over the last several days.  She is currently sexually active with one partner and uses condoms.  She denies dysuria, urinary frequency, or abdominal pain.    Plan: -Vaginal self-swab  Addendum: Cervicovaginal swab results with Candida glabrata and Bacterial vaginosis.  Will send in metronidazole 500 mg for 7 days and have patient follow-up next week.  Candida glabrata has low virulence in vaginal canal. If patient continues to be symptomatic next week, may consider further treatment of candida glabrata. -metronidazole 500 mg BID for 7 days

## 2020-11-23 NOTE — Assessment & Plan Note (Addendum)
Patient was started on pravastatin 40 mg in 02.2022 Lab Results  Component Value Date   CHOL 252 (H) 02/18/2020   HDL 68 02/18/2020   LDLCALC 175 (H) 02/18/2020   TRIG 55 02/18/2020   CHOLHDL 3.7 02/18/2020   She reports that she has been taking this medication daily.  ASCVD risk of 6.2%.  Plan: -recheck lipid panel

## 2020-11-23 NOTE — Assessment & Plan Note (Signed)
Patient currently takes amlodipine 10 mg and Olmesartan 40 mg.  Blood pressure is well controlled today. BP Readings from Last 3 Encounters:  11/22/20 134/75  11/15/20 (!) 147/80  10/25/20 (!) 153/91   Plan: -check BMP -Consider combo pill for convenience if labs are stable.

## 2020-11-23 NOTE — Assessment & Plan Note (Signed)
Patient presents for pap smear today, however she had a negative pap smear with HPV co testing 3 years ago.  She will need another pap in 2024.

## 2020-11-23 NOTE — Assessment & Plan Note (Signed)
Patient states that she is congested and has a productive cough. She states the she is out of allergy medication.  Plan: -refilled Cetirizine

## 2020-11-23 NOTE — Assessment & Plan Note (Signed)
Patient states that she had steroid injections to the left side of her neck by PMR and her pain has been relieved.  She now has pain on the right side of her neck. She states that when she called to request an appointment, she was told that she would need another referral.  Plan: -Referral sports medicine

## 2020-11-24 MED ORDER — METRONIDAZOLE 500 MG PO TABS: 500.0000 mg | ORAL_TABLET | Freq: Two times a day (BID) | ORAL | 0 refills | Status: AC

## 2020-11-24 NOTE — Addendum Note (Signed)
Addended by: Edwyna Perfect on: 11/24/2020 09:49 AM   Modules accepted: Orders

## 2020-11-24 NOTE — Progress Notes (Signed)
Internal Medicine Clinic Attending  I saw and evaluated the patient.  I personally confirmed the key portions of the history and exam documented by Dr. Howie Ill and I reviewed pertinent patient test results.  The assessment, diagnosis, and plan were formulated together and I agree with the documentation in the resident's note. Increase in vaginal discharge without pain, itching, burning, or other symptoms. Discussed vaginal self-swab which patient was agreeable to. Encouraged safe sex practices.

## 2020-11-24 NOTE — Addendum Note (Signed)
Addended by: Edwyna Perfect on: 11/24/2020 10:44 AM   Modules accepted: Orders

## 2020-11-28 ENCOUNTER — Ambulatory Visit: Payer: Medicaid Other | Admitting: Registered Nurse

## 2020-11-28 ENCOUNTER — Ambulatory Visit (INDEPENDENT_AMBULATORY_CARE_PROVIDER_SITE_OTHER): Payer: Medicaid Other | Admitting: Family Medicine

## 2020-11-28 ENCOUNTER — Other Ambulatory Visit: Payer: Self-pay

## 2020-11-28 VITALS — BP 120/84 | Ht 63.0 in | Wt 210.0 lb

## 2020-11-28 DIAGNOSIS — M5412 Radiculopathy, cervical region: Secondary | ICD-10-CM

## 2020-11-28 NOTE — Progress Notes (Signed)
Patient came in today for injections for cervical radiculopathy vs trigger point injections though these have been managed/performed by different physician.  She spoke with her daughter during visit and realized she was in the wrong office - will no charge today's visit and she will follow up with them.

## 2020-11-30 ENCOUNTER — Ambulatory Visit (INDEPENDENT_AMBULATORY_CARE_PROVIDER_SITE_OTHER): Payer: Medicaid Other | Admitting: Internal Medicine

## 2020-11-30 ENCOUNTER — Other Ambulatory Visit: Payer: Self-pay | Admitting: *Deleted

## 2020-11-30 ENCOUNTER — Telehealth: Payer: Self-pay | Admitting: *Deleted

## 2020-11-30 VITALS — BP 126/75 | HR 78 | Temp 98.5°F | Ht 63.0 in | Wt 219.2 lb

## 2020-11-30 DIAGNOSIS — M5412 Radiculopathy, cervical region: Secondary | ICD-10-CM

## 2020-11-30 DIAGNOSIS — H6121 Impacted cerumen, right ear: Secondary | ICD-10-CM

## 2020-11-30 DIAGNOSIS — E785 Hyperlipidemia, unspecified: Secondary | ICD-10-CM

## 2020-11-30 DIAGNOSIS — E041 Nontoxic single thyroid nodule: Secondary | ICD-10-CM | POA: Diagnosis not present

## 2020-11-30 DIAGNOSIS — M1711 Unilateral primary osteoarthritis, right knee: Secondary | ICD-10-CM

## 2020-11-30 DIAGNOSIS — N898 Other specified noninflammatory disorders of vagina: Secondary | ICD-10-CM

## 2020-11-30 DIAGNOSIS — I10 Essential (primary) hypertension: Secondary | ICD-10-CM

## 2020-11-30 NOTE — Assessment & Plan Note (Addendum)
Patient presents due to chronic pain in bilateral knees.  She states that she has seen orthopedics previously several years ago and was told she needed a knee replacement at that time.  She has received steroid injection and rooster cone injections in the past with relief for short periods of time.  She also has tried physical therapy in the past and did not find that helpful.  Yesterday she fell due to her right knee giving out and she has had more pain since then.  She reports that she fell onto her left arm and shoulder, but did not hit head at that time.  On physical exam, she is sitting in wheelchair with knee brace in place on right knee.  She has decreased range of motion in flexion of right knee with crepitus felt with movement.  No effusion present on right knee.    Plan: -referral to [place] [facility] for right knee osteoarthritis

## 2020-11-30 NOTE — Telephone Encounter (Signed)
Patient called in stating she was told at today's appt she would receive refill on flexeril for neck tightness, but it's not at the pharmacy. Please advise.

## 2020-11-30 NOTE — Assessment & Plan Note (Signed)
Patient presents for one week follow-up after self swab results with candida glabrata and bacterial vaginosis. Patient reports completing 7 day course of metronidazole this morning.  She states that discharge has decreased.  Plan: Patient reports improvement in symptoms.  Candida glabrata has low virulence in vaginal canal, will not treat for this at this time due to patient's symptoms resolving.

## 2020-11-30 NOTE — Patient Instructions (Signed)
[name], it was a pleasure seeing you today!  Today we discussed: Knee pain I am referring you back to [facility]. Hopefully they will be able to offer you further injections or procedures to help with your knee pain.  Ear wax Today we removed some ear wax.  Please avoid using q-tips.   Cholesterol We are getting some blood work today to check cholesterol.  Please continue taking pravastatin 40 mg.  Blood pressure Also getting some labs to check your kidneys and electrolytes. I will call you with those results in a few days.  I have ordered the following labs today:  Lab Orders  No laboratory test(s) ordered today     Tests ordered today:  BMP, lipid panel  Referrals ordered today:   Referral Orders  No referral(s) requested today     I have ordered the following medication/changed the following medications:   Stop the following medications: There are no discontinued medications.   Start the following medications: No orders of the defined types were placed in this encounter.    Follow-up: 6 months   Please make sure to arrive 15 minutes prior to your next appointment. If you arrive late, you may be asked to reschedule.   We look forward to seeing you next time. Please call our clinic at [phone] if you have any questions or concerns. The best time to call is Monday-Friday from 9am-4pm, but there is someone available 24/7. If after hours or the weekend, call the main hospital number and ask for the Internal Medicine Resident On-Call. If you need medication refills, please notify your pharmacy one week in advance and they will send [place] a request.  Thank you for letting us take part in your care. Wishing you the best!  Thank you, Dr. [facility]

## 2020-11-30 NOTE — Assessment & Plan Note (Addendum)
Patient currently taking Pravastatin 40 mg in 02/2020 Lab Results  Component Value Date   CHOL 252 (H) 02/18/2020   HDL 68 02/18/2020   LDLCALC 175 (H) 02/18/2020   TRIG 55 02/18/2020   CHOLHDL 3.7 02/18/2020   Plan: Was not able to get labs last week so will complete lipid panel today. Follow-up in 6 months

## 2020-11-30 NOTE — Assessment & Plan Note (Addendum)
Patient continues to take amlodipine 10 mg and Olmesartan 40 mg once daily.  Blood pressure continues to be well controlled today. BP Readings from Last 3 Encounters:  11/30/20 126/75  11/28/20 120/84  11/22/20 134/75   Plan: BMP was not checked at last visit, so will complete that today Follow-up in 6 months
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
## 2020-11-30 NOTE — Assessment & Plan Note (Signed)
Patient presents with right ear cerumen impaction.  She has been using Debrox drops daily for the last week. On physical exam, there is a large amount of cerumen present worse in right ear.  Irrigation was completed with warm water and carbon peroxide.  On reevaluation of ears, a small amount of skin remained within the right ear canal, but cerumen was successfully removed.  Curette was used to try to remove remaining skin, but patient requested to stop.  Plan: -Continue using debrox as needed

## 2020-12-01 DIAGNOSIS — E041 Nontoxic single thyroid nodule: Secondary | ICD-10-CM

## 2020-12-01 HISTORY — DX: Nontoxic single thyroid nodule: E04.1

## 2020-12-01 LAB — BMP8+ANION GAP
Anion Gap: 16 mmol/L (ref 10.0–18.0)
BUN/Creatinine Ratio: 22 (ref 9–23)
BUN: 13 mg/dL (ref 6–24)
CO2: 21 mmol/L (ref 20–29)
Calcium: 9.6 mg/dL (ref 8.7–10.2)
Chloride: 104 mmol/L (ref 96–106)
Creatinine, Ser: 0.6 mg/dL (ref 0.57–1.00)
Glucose: 88 mg/dL (ref 70–99)
Potassium: 4.5 mmol/L (ref 3.5–5.2)
Sodium: 141 mmol/L (ref 134–144)
eGFR: 105 mL/min/{1.73_m2} (ref >59)

## 2020-12-01 LAB — LIPID PANEL
Chol/HDL Ratio: 3.3 ratio (ref 0.0–4.4)
Cholesterol, Total: 157 mg/dL (ref 100–199)
HDL: 47 mg/dL (ref >39)
LDL Chol Calc (NIH): 93 mg/dL (ref 0–99)
Triglycerides: 88 mg/dL (ref 0–149)
VLDL Cholesterol Cal: 17 mg/dL (ref 5–40)

## 2020-12-01 MED ORDER — CYCLOBENZAPRINE HCL 5 MG PO TABS: 5.0000 mg | ORAL_TABLET | Freq: Two times a day (BID) | ORAL | 0 refills | Status: DC

## 2020-12-01 NOTE — Assessment & Plan Note (Signed)
ADDENDUM: Patient seen 11/16. At that time she stated that her neck felt tight and she was still waiting on referral to sports medicine.  She called later in the day requesting another short course of flexeril until she is able to be seen by sports medicine.  Plan: -short course of flexeril

## 2020-12-01 NOTE — Addendum Note (Signed)
Addended by: Edwyna Perfect on: 12/01/2020 01:07 PM   Modules accepted: Orders

## 2020-12-01 NOTE — Assessment & Plan Note (Addendum)
ADDENDUM: On chart review, patient had incidental left thyroid nodule noted on MRI 08/2020 . A dedicated thyroid ultrasound is recommended for further evaluation.  Plan: -Called and updated patient  -placed order for ultrasound of thyroid

## 2020-12-02 ENCOUNTER — Other Ambulatory Visit: Payer: Self-pay

## 2020-12-02 ENCOUNTER — Ambulatory Visit (INDEPENDENT_AMBULATORY_CARE_PROVIDER_SITE_OTHER): Payer: Medicaid Other

## 2020-12-02 ENCOUNTER — Ambulatory Visit (INDEPENDENT_AMBULATORY_CARE_PROVIDER_SITE_OTHER): Payer: Medicaid Other | Admitting: Orthopedic Surgery

## 2020-12-02 ENCOUNTER — Telehealth: Payer: Self-pay

## 2020-12-02 VITALS — Ht 63.0 in | Wt 215.0 lb

## 2020-12-02 DIAGNOSIS — M25561 Pain in right knee: Secondary | ICD-10-CM

## 2020-12-02 DIAGNOSIS — M170 Bilateral primary osteoarthritis of knee: Secondary | ICD-10-CM | POA: Diagnosis not present

## 2020-12-02 NOTE — Progress Notes (Signed)
Internal Medicine Clinic Attending  I saw and evaluated the patient.  I personally confirmed the key portions of the history and exam documented by Dr. Masters and I reviewed pertinent patient test results.  The assessment, diagnosis, and plan were formulated together and I agree with the documentation in the resident's note.  

## 2020-12-02 NOTE — Telephone Encounter (Signed)
Is patient still here, if so I can give her the application.

## 2020-12-02 NOTE — Telephone Encounter (Signed)
Okay. Thank you.

## 2020-12-02 NOTE — Telephone Encounter (Signed)
Can we see if patient will qualify for [employer] bilat knee gel injection patient assistance program?

## 2020-12-02 NOTE — Telephone Encounter (Signed)
Sorry, patient is gone.

## 2020-12-04 ENCOUNTER — Encounter: Payer: Self-pay | Admitting: Orthopedic Surgery
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
## 2020-12-05 ENCOUNTER — Other Ambulatory Visit: Payer: Self-pay | Admitting: Student

## 2020-12-05 ENCOUNTER — Telehealth: Payer: Self-pay | Admitting: Orthopedic Surgery

## 2020-12-05 DIAGNOSIS — F331 Major depressive disorder, recurrent, moderate: Secondary | ICD-10-CM

## 2020-12-05 NOTE — Telephone Encounter (Signed)
Patient called needing Rx for Diclofenac sent to [facility] on [address]. The number to contact patient is [phone]

## 2020-12-06 ENCOUNTER — Other Ambulatory Visit: Payer: Self-pay | Admitting: Surgical

## 2020-12-06 ENCOUNTER — Telehealth: Payer: Self-pay

## 2020-12-06 MED ORDER — CELECOXIB 100 MG PO CAPS: 100.0000 mg | ORAL_CAPSULE | Freq: Two times a day (BID) | ORAL | 0 refills | Status: DC

## 2020-12-06 NOTE — Telephone Encounter (Signed)
Sent in RX for celebrex which epic said is covered

## 2020-12-06 NOTE — Telephone Encounter (Signed)
Mailed [employer] application to patient's address listed for Monovisc, bilateral knee.

## 2020-12-06 NOTE — Telephone Encounter (Signed)
Tried calling patient to discuss. No answer. LMVM advising per below.

## 2020-12-13 MED ORDER — METHYLPREDNISOLONE ACETATE 40 MG/ML IJ SUSP
40.0000 mg | INTRAMUSCULAR | Status: AC | PRN
Start: 2020-12-02 — End: 2020-12-02
  Administered 2020-12-02: 40 mg via INTRA_ARTICULAR

## 2020-12-13 MED ORDER — LIDOCAINE HCL 1 % IJ SOLN
5.0000 mL | INTRAMUSCULAR | Status: AC | PRN
  Administered 2020-12-02: 5 mL

## 2020-12-13 MED ORDER — BUPIVACAINE HCL 0.25 % IJ SOLN
4.0000 mL | INTRAMUSCULAR | Status: AC | PRN
Start: 2020-12-02 — End: 2020-12-02
  Administered 2020-12-02: 4 mL via INTRA_ARTICULAR

## 2020-12-13 MED ORDER — LIDOCAINE HCL 1 % IJ SOLN
5.0000 mL | INTRAMUSCULAR | Status: AC | PRN
Start: 2020-12-02 — End: 2020-12-02
  Administered 2020-12-02: 5 mL

## 2020-12-13 MED ORDER — BUPIVACAINE HCL 0.25 % IJ SOLN
4.0000 mL | INTRAMUSCULAR | Status: AC | PRN
  Administered 2020-12-02: 4 mL via INTRA_ARTICULAR

## 2020-12-19 ENCOUNTER — Telehealth: Payer: Self-pay | Admitting: *Deleted

## 2020-12-19 ENCOUNTER — Other Ambulatory Visit: Payer: Self-pay | Admitting: Physical Medicine and Rehabilitation

## 2020-12-19 ENCOUNTER — Telehealth: Payer: Self-pay

## 2020-12-19 MED ORDER — OXYCODONE-ACETAMINOPHEN 5-325 MG PO TABS: 1.0000 | ORAL_TABLET | Freq: Four times a day (QID) | ORAL | 0 refills | Status: DC | PRN

## 2020-12-19 NOTE — Telephone Encounter (Signed)
Patient is calling to request refill on Percocet 5-325 mg. Last fill 11/17/20 per PMP

## 2020-12-19 NOTE — Telephone Encounter (Signed)
Patient notified

## 2020-12-19 NOTE — Telephone Encounter (Signed)
Pt called about clarifying which medications she should be taking for high blood pressure. Informed pt she should be taking Amlodipine and Olmesartan and stop Lisinopril - per 9/13 OV with Dr Humphrey Rolls   "Discontinue lisinopril -Start Olmesartan 40 mg daily -Continue Amlodipine 10 mg daily" Pt repeated the above.

## 2020-12-26 ENCOUNTER — Other Ambulatory Visit: Payer: Self-pay | Admitting: Student

## 2020-12-26 ENCOUNTER — Ambulatory Visit: Payer: Medicaid Other | Admitting: Registered Nurse

## 2020-12-26 DIAGNOSIS — K219 Gastro-esophageal reflux disease without esophagitis: Secondary | ICD-10-CM

## 2021-01-10 ENCOUNTER — Other Ambulatory Visit: Payer: Self-pay

## 2021-01-10 ENCOUNTER — Encounter: Payer: Self-pay | Admitting: Registered Nurse

## 2021-01-10 ENCOUNTER — Encounter: Payer: Medicaid Other | Attending: Physical Medicine and Rehabilitation | Admitting: Registered Nurse

## 2021-01-10 VITALS — BP 134/82 | HR 77 | Temp 98.5°F | Ht 63.0 in | Wt 216.6 lb

## 2021-01-10 DIAGNOSIS — Z5181 Encounter for therapeutic drug level monitoring: Secondary | ICD-10-CM

## 2021-01-10 DIAGNOSIS — M170 Bilateral primary osteoarthritis of knee: Secondary | ICD-10-CM | POA: Diagnosis not present

## 2021-01-10 DIAGNOSIS — M5416 Radiculopathy, lumbar region: Secondary | ICD-10-CM | POA: Diagnosis not present

## 2021-01-10 DIAGNOSIS — G894 Chronic pain syndrome: Secondary | ICD-10-CM | POA: Diagnosis not present

## 2021-01-10 DIAGNOSIS — Z79891 Long term (current) use of opiate analgesic: Secondary | ICD-10-CM | POA: Diagnosis not present

## 2021-01-10 DIAGNOSIS — M2550 Pain in unspecified joint: Secondary | ICD-10-CM | POA: Insufficient documentation

## 2021-01-10 MED ORDER — OXYCODONE-ACETAMINOPHEN 5-325 MG PO TABS: 1.0000 | ORAL_TABLET | Freq: Four times a day (QID) | ORAL | 0 refills | Status: DC | PRN
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
## 2021-01-17 ENCOUNTER — Encounter: Payer: Medicaid Other | Admitting: Internal Medicine

## 2021-01-22 ENCOUNTER — Encounter: Payer: Self-pay | Admitting: Registered Nurse

## 2021-01-25 ENCOUNTER — Other Ambulatory Visit: Payer: Self-pay

## 2021-01-25 ENCOUNTER — Encounter: Payer: Medicaid Other | Admitting: Orthopedic Surgery

## 2021-01-25 ENCOUNTER — Encounter: Payer: Self-pay | Admitting: Orthopedic Surgery

## 2021-01-26 ENCOUNTER — Telehealth: Payer: Self-pay

## 2021-01-26 NOTE — Telephone Encounter (Signed)
Faxed completed [employer] application to [phone] for Monovisc, bilateral knee.

## 2021-02-03 ENCOUNTER — Telehealth: Payer: Self-pay

## 2021-02-03 NOTE — Telephone Encounter (Signed)
Submitted VOB for SynviscOne, bilateral knee due to [employer] not approving patient and stating that patient has insurance.  Approved for SynviscOne, bilateral knee. Buy & Bill Covered at 100% after Co-pay Co-pay of $4.00 required No PA required

## 2021-02-10 ENCOUNTER — Encounter: Payer: Medicaid Other | Admitting: Internal Medicine

## 2021-02-13 ENCOUNTER — Encounter: Payer: Self-pay | Admitting: Registered Nurse

## 2021-02-13 ENCOUNTER — Encounter: Payer: Medicaid Other | Admitting: Internal Medicine

## 2021-02-13 ENCOUNTER — Encounter: Payer: Medicaid Other | Attending: Physical Medicine and Rehabilitation | Admitting: Registered Nurse

## 2021-02-13 ENCOUNTER — Telehealth: Payer: Self-pay

## 2021-02-13 ENCOUNTER — Other Ambulatory Visit: Payer: Self-pay

## 2021-02-13 VITALS — BP 136/79 | HR 81 | Temp 99.3°F | Ht 63.0 in | Wt 216.0 lb

## 2021-02-13 DIAGNOSIS — Z79891 Long term (current) use of opiate analgesic: Secondary | ICD-10-CM | POA: Insufficient documentation

## 2021-02-13 DIAGNOSIS — Z5181 Encounter for therapeutic drug level monitoring: Secondary | ICD-10-CM | POA: Insufficient documentation

## 2021-02-13 DIAGNOSIS — M2550 Pain in unspecified joint: Secondary | ICD-10-CM | POA: Insufficient documentation

## 2021-02-13 DIAGNOSIS — G894 Chronic pain syndrome: Secondary | ICD-10-CM | POA: Insufficient documentation

## 2021-02-13 DIAGNOSIS — M545 Low back pain, unspecified: Secondary | ICD-10-CM | POA: Insufficient documentation

## 2021-02-13 DIAGNOSIS — M170 Bilateral primary osteoarthritis of knee: Secondary | ICD-10-CM | POA: Diagnosis not present

## 2021-02-13 DIAGNOSIS — G8929 Other chronic pain: Secondary | ICD-10-CM | POA: Insufficient documentation

## 2021-02-13 DIAGNOSIS — M5412 Radiculopathy, cervical region: Secondary | ICD-10-CM | POA: Diagnosis not present

## 2021-02-13 DIAGNOSIS — M542 Cervicalgia: Secondary | ICD-10-CM | POA: Diagnosis not present

## 2021-02-13 MED ORDER — OXYCODONE-ACETAMINOPHEN 5-325 MG PO TABS: 1.0000 | ORAL_TABLET | Freq: Two times a day (BID) | ORAL | 0 refills | Status: DC | PRN
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
## 2021-02-13 NOTE — Telephone Encounter (Signed)
PA for Percocet sent to [facility]

## 2021-02-14 ENCOUNTER — Encounter: Payer: Medicaid Other | Admitting: Physical Medicine and Rehabilitation

## 2021-02-14 ENCOUNTER — Other Ambulatory Visit: Payer: Self-pay

## 2021-02-14 ENCOUNTER — Encounter: Payer: Self-pay | Admitting: Physical Medicine and Rehabilitation

## 2021-02-14 ENCOUNTER — Encounter: Payer: Medicaid Other | Admitting: Internal Medicine

## 2021-02-14 VITALS — BP 130/74 | HR 74 | Temp 98.0°F | Ht 63.0 in | Wt 216.0 lb

## 2021-02-14 DIAGNOSIS — Z5181 Encounter for therapeutic drug level monitoring: Secondary | ICD-10-CM | POA: Diagnosis not present

## 2021-02-14 DIAGNOSIS — M2550 Pain in unspecified joint: Secondary | ICD-10-CM | POA: Diagnosis not present

## 2021-02-14 DIAGNOSIS — M545 Low back pain, unspecified: Secondary | ICD-10-CM | POA: Diagnosis not present

## 2021-02-14 DIAGNOSIS — M542 Cervicalgia: Secondary | ICD-10-CM | POA: Diagnosis not present

## 2021-02-14 DIAGNOSIS — M5412 Radiculopathy, cervical region: Secondary | ICD-10-CM | POA: Diagnosis not present

## 2021-02-14 DIAGNOSIS — Z79891 Long term (current) use of opiate analgesic: Secondary | ICD-10-CM | POA: Diagnosis not present

## 2021-02-14 DIAGNOSIS — G894 Chronic pain syndrome: Secondary | ICD-10-CM | POA: Diagnosis not present

## 2021-02-14 DIAGNOSIS — G8929 Other chronic pain: Secondary | ICD-10-CM | POA: Diagnosis not present

## 2021-02-14 DIAGNOSIS — M170 Bilateral primary osteoarthritis of knee: Secondary | ICD-10-CM | POA: Diagnosis not present

## 2021-02-14 NOTE — Progress Notes (Signed)

## 2021-02-14 NOTE — Telephone Encounter (Signed)
[employer] denied Percocet 5-325 mg. They stated it was denied due to lack of information. Called [facility] [place] and asked for fax number to send additional notes  Fax number- [phone]

## 2021-02-14 NOTE — Patient Instructions (Signed)
-  Recommended starting NAC (N-Acetyl cysteine) 600mg  BID for her fatigue. Discussed its benefits in boosting glutathione and mitochondrial function.

## 2021-02-15 ENCOUNTER — Ambulatory Visit: Payer: Medicaid Other | Admitting: Orthopedic Surgery

## 2021-02-15 NOTE — Telephone Encounter (Signed)
[facility] [employer]. Approval 02/14/21-08/13/21

## 2021-02-28 ENCOUNTER — Ambulatory Visit: Payer: Medicaid Other | Admitting: Physical Medicine and Rehabilitation

## 2021-03-08 ENCOUNTER — Encounter: Payer: Self-pay | Admitting: Internal Medicine

## 2021-03-08 ENCOUNTER — Other Ambulatory Visit (HOSPITAL_COMMUNITY)
Admission: RE | Admit: 2021-03-08 | Discharge: 2021-03-08 | Disposition: A | Discharge status: Home / Self Care | Payer: Medicaid Other | Source: Ambulatory Visit | Attending: Internal Medicine | Admitting: Internal Medicine

## 2021-03-08 ENCOUNTER — Ambulatory Visit: Payer: Medicaid Other | Admitting: Internal Medicine

## 2021-03-08 VITALS — BP 137/77 | HR 80 | Temp 98.5°F | Ht 63.0 in | Wt 218.3 lb

## 2021-03-08 DIAGNOSIS — Z6838 Body mass index (BMI) 38.0-38.9, adult: Secondary | ICD-10-CM

## 2021-03-08 DIAGNOSIS — E6609 Other obesity due to excess calories: Secondary | ICD-10-CM | POA: Diagnosis not present

## 2021-03-08 DIAGNOSIS — E669 Obesity, unspecified: Secondary | ICD-10-CM | POA: Diagnosis not present

## 2021-03-08 DIAGNOSIS — N898 Other specified noninflammatory disorders of vagina: Secondary | ICD-10-CM

## 2021-03-08 DIAGNOSIS — I10 Essential (primary) hypertension: Secondary | ICD-10-CM | POA: Diagnosis not present

## 2021-03-08 DIAGNOSIS — E785 Hyperlipidemia, unspecified: Secondary | ICD-10-CM | POA: Diagnosis not present

## 2021-03-08 MED ORDER — AMLODIPINE BESYLATE 10 MG PO TABS: 10.0000 mg | ORAL_TABLET | Freq: Every day | ORAL | 3 refills | Status: DC

## 2021-03-08 MED ORDER — OLMESARTAN MEDOXOMIL 40 MG PO TABS: 40.0000 mg | ORAL_TABLET | Freq: Every day | ORAL | 3 refills | Status: DC

## 2021-03-08 NOTE — Addendum Note (Signed)
Addended by: Marcelino Duster on: 03/08/2021 12:56 PM   Modules accepted: Orders

## 2021-03-08 NOTE — Assessment & Plan Note (Signed)
Patient reports taking her blood pressure medication right before coming to clinic. We also performed a pelvic exam today which may have elevated her BP higher than normal. Her BP is borderline so will plan to recheck her blood pressure at her next visit. - continue amlodipine and olmesartan

## 2021-03-08 NOTE — Assessment & Plan Note (Signed)
Patient is not currently taking phentermine, says she cannot afford it.
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
## 2021-03-08 NOTE — Assessment & Plan Note (Addendum)
Patient previously had bacterial vaginosis in November and tested positive for candida glabrata as well as that time. She was treated for BV but not for the CG. Today she reports some vaginal itching and discomfort. On exam her cervix had some red irritated spots concerning for possible trichomonas.  Plan - f/u vaginal swab  Addendum: Swab positive for both BV and CB. Will call in a course of metronidazole for one week. If patient's symptoms have not resolved after treatment will treat for CB

## 2021-03-08 NOTE — Patient Instructions (Signed)
Carrie Franco  It was a pleasure seeing you in the clinic today.   We talked about your cholesterol, your blood pressure, and your vaginal discharge. We took a swab which I will test for any infections or yeast that may be causing your discomfort.  Please call our clinic at [phone] if you have any questions or concerns. The best time to call is Monday-Friday from 9am-4pm, but there is someone available 24/7 at the same number. If you need medication refills, please notify your pharmacy one week in advance and they will send [place] a request.   Thank you for letting us take part in your care. We look forward to seeing you next time!

## 2021-03-08 NOTE — Assessment & Plan Note (Signed)
Patient reports side effects with both medications she was given in the past, does not want to try cholesterol medication at this time. Would like to try and see if she can manage her cholesterol through diet at this time. Her back hurt too much when taking the medication. She stopped about a month ago. - referral to nutrition

## 2021-03-09 LAB — CERVICOVAGINAL ANCILLARY ONLY
Bacterial Vaginitis (gardnerella): POSITIVE — AB
Candida Glabrata: POSITIVE — AB
Candida Vaginitis: NEGATIVE
Chlamydia: NEGATIVE
Comment: NEGATIVE
Comment: NEGATIVE
Comment: NEGATIVE
Comment: NEGATIVE
Comment: NEGATIVE
Comment: NORMAL
Neisseria Gonorrhea: NEGATIVE
Trichomonas: NEGATIVE

## 2021-03-09 MED ORDER — METRONIDAZOLE 500 MG PO TABS: 500.0000 mg | ORAL_TABLET | Freq: Two times a day (BID) | ORAL | 0 refills | Status: AC

## 2021-03-09 NOTE — Addendum Note (Signed)
Addended by: Inda Coke on: 03/09/2021 02:09 PM   Modules accepted: Orders

## 2021-03-10 NOTE — Progress Notes (Addendum)
Internal Medicine Clinic Attending  Case discussed with Dr. Ileene Musa  At the time of the visit.  We reviewed the residents history and exam and pertinent patient test results.  I agree with the assessment, diagnosis, and plan of care documented in the residents note. Given concomitant BV as alternative cause of vaginal symptoms, will treat with metronidazole. If sx resolve, no further treatment for Candida glabrata given low vaginal virulence--however, given persistence, if symptoms do recur again with continued positivity would likely treat both at that time.

## 2021-03-14 ENCOUNTER — Encounter: Payer: Medicaid Other | Attending: Physical Medicine and Rehabilitation | Admitting: Registered Nurse

## 2021-03-14 ENCOUNTER — Other Ambulatory Visit: Payer: Self-pay

## 2021-03-14 ENCOUNTER — Encounter: Payer: Self-pay | Admitting: Registered Nurse

## 2021-03-14 ENCOUNTER — Encounter: Payer: Medicaid Other | Admitting: Registered Nurse

## 2021-03-14 VITALS — BP 132/80 | HR 77 | Temp 98.9°F | Ht 63.0 in | Wt 214.6 lb

## 2021-03-14 DIAGNOSIS — Z5181 Encounter for therapeutic drug level monitoring: Secondary | ICD-10-CM | POA: Insufficient documentation

## 2021-03-14 DIAGNOSIS — M170 Bilateral primary osteoarthritis of knee: Secondary | ICD-10-CM | POA: Diagnosis not present

## 2021-03-14 DIAGNOSIS — M2550 Pain in unspecified joint: Secondary | ICD-10-CM | POA: Insufficient documentation

## 2021-03-14 DIAGNOSIS — G894 Chronic pain syndrome: Secondary | ICD-10-CM | POA: Insufficient documentation

## 2021-03-14 DIAGNOSIS — M545 Low back pain, unspecified: Secondary | ICD-10-CM | POA: Diagnosis not present

## 2021-03-14 DIAGNOSIS — G8929 Other chronic pain: Secondary | ICD-10-CM

## 2021-03-14 DIAGNOSIS — Z79891 Long term (current) use of opiate analgesic: Secondary | ICD-10-CM | POA: Insufficient documentation

## 2021-03-14 MED ORDER — OXYCODONE-ACETAMINOPHEN 5-325 MG PO TABS
1.0000 | ORAL_TABLET | Freq: Two times a day (BID) | ORAL | 0 refills | Status: DC | PRN
Start: 2021-03-14 — End: 2021-04-13
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
## 2021-03-15 ENCOUNTER — Ambulatory Visit: Payer: Medicaid Other | Admitting: Orthopedic Surgery

## 2021-03-16 LAB — TOXASSURE SELECT,+ANTIDEPR,UR

## 2021-03-20 ENCOUNTER — Telehealth: Payer: Self-pay | Admitting: *Deleted

## 2021-03-20 ENCOUNTER — Ambulatory Visit (HOSPITAL_COMMUNITY): Admission: RE | Admit: 2021-03-20 | Payer: Medicaid Other | Source: Ambulatory Visit

## 2021-03-20 NOTE — Telephone Encounter (Signed)
Urine drug screen for this encounter is consistent for prescribed medication 

## 2021-03-23 ENCOUNTER — Encounter
Payer: Medicaid Other | Attending: Physical Medicine and Rehabilitation | Admitting: Physical Medicine and Rehabilitation

## 2021-03-23 ENCOUNTER — Other Ambulatory Visit: Payer: Self-pay

## 2021-03-23 VITALS — BP 146/79 | HR 78 | Ht 63.0 in | Wt 214.0 lb

## 2021-03-23 DIAGNOSIS — M25512 Pain in left shoulder: Secondary | ICD-10-CM | POA: Diagnosis not present

## 2021-03-23 DIAGNOSIS — G8929 Other chronic pain: Secondary | ICD-10-CM | POA: Diagnosis not present

## 2021-03-23 DIAGNOSIS — M5412 Radiculopathy, cervical region: Secondary | ICD-10-CM | POA: Insufficient documentation

## 2021-03-23 NOTE — Progress Notes (Signed)

## 2021-03-24 ENCOUNTER — Other Ambulatory Visit: Payer: Self-pay

## 2021-03-24 NOTE — Telephone Encounter (Signed)
ibuprofen (ADVIL) 800 MG tablet ?[place], [place] ?

## 2021-03-27 ENCOUNTER — Telehealth: Payer: Self-pay | Admitting: *Deleted

## 2021-03-27 NOTE — Telephone Encounter (Signed)
Carrie Franco called reporting that she is still having "so much pain". The injections did not help.   ?

## 2021-03-28 ENCOUNTER — Other Ambulatory Visit: Payer: Self-pay

## 2021-03-28 ENCOUNTER — Encounter (HOSPITAL_BASED_OUTPATIENT_CLINIC_OR_DEPARTMENT_OTHER): Payer: Medicaid Other | Admitting: Physical Medicine and Rehabilitation

## 2021-03-28 DIAGNOSIS — M5412 Radiculopathy, cervical region: Secondary | ICD-10-CM

## 2021-03-28 MED ORDER — AMITRIPTYLINE HCL 10 MG PO TABS: 10.0000 mg | ORAL_TABLET | Freq: Every day | ORAL | 1 refills | Status: DC

## 2021-03-28 MED ORDER — IBUPROFEN 800 MG PO TABS: 800.0000 mg | ORAL_TABLET | Freq: Four times a day (QID) | ORAL | 0 refills | Status: DC | PRN
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
## 2021-03-29 ENCOUNTER — Other Ambulatory Visit: Payer: Self-pay | Admitting: Internal Medicine

## 2021-03-29 DIAGNOSIS — K219 Gastro-esophageal reflux disease without esophagitis: Secondary | ICD-10-CM

## 2021-03-30 ENCOUNTER — Ambulatory Visit: Payer: Medicaid Other | Admitting: Orthopedic Surgery

## 2021-03-31 ENCOUNTER — Encounter: Payer: Self-pay | Admitting: Orthopedic Surgery

## 2021-03-31 ENCOUNTER — Other Ambulatory Visit: Payer: Self-pay

## 2021-03-31 ENCOUNTER — Ambulatory Visit: Payer: Medicaid Other | Admitting: Orthopedic Surgery

## 2021-03-31 DIAGNOSIS — M170 Bilateral primary osteoarthritis of knee: Secondary | ICD-10-CM | POA: Diagnosis not present

## 2021-03-31 DIAGNOSIS — M1711 Unilateral primary osteoarthritis, right knee: Secondary | ICD-10-CM

## 2021-03-31 DIAGNOSIS — M1712 Unilateral primary osteoarthritis, left knee: Secondary | ICD-10-CM

## 2021-03-31 DIAGNOSIS — M5410 Radiculopathy, site unspecified: Secondary | ICD-10-CM

## 2021-03-31 DIAGNOSIS — M25561 Pain in right knee: Secondary | ICD-10-CM

## 2021-03-31 NOTE — Progress Notes (Signed)
? ?  Procedure Note ? ?Patient: Carrie Franco             ?Date of Birth: 1965/04/28           ?MRN: [id number]             ?Visit Date: 03/31/2021 ? ?Procedures: ?Visit Diagnoses:  ?1. Bilateral primary osteoarthritis of knee   ?2. Right knee pain, unspecified chronicity   ?3. Radicular syndrome of right lower extremity   ? ? ?Large Joint Inj: bilateral knee on 03/31/2021 1:19 PM ?Indications: pain, joint swelling and diagnostic evaluation ?Details: 18 G 1.5 in needle, superolateral approach ? ?Arthrogram: No ? ?Medications (Right): 5 mL lidocaine 1 %; 48 mg Hylan 48 MG/6ML ?Medications (Left): 5 mL lidocaine 1 %; 48 mg Hylan 48 MG/6ML ?Outcome: tolerated well, no immediate complications ? ?Patient tolerated procedure well today.  She did note bilateral burning pain from her knees down to her distal calf without really involvement of her feet or thighs.  Denies any buttocks pain or any significant increase in her low back pain.  She states that this has been going on for several months at this point.  She has occasional numbness or tingling but the main concern is the burning pain.  Mostly notices this in the lateral aspect of both calves.  She has excellent dorsiflexion, plantarflexion, EHL strength of both lower extremities on exam today.  However, with the continued burning pain, plan to order nerve conduction study of the most symptomatic lower extremity which is her right lower extremity.  Evaluating for peroneal nerve compression versus neuropathy responsible for her symptoms.  Follow-up for review of right leg nerve conduction study. ?Procedure, treatment alternatives, risks and benefits explained, specific risks discussed. Consent was given by the patient. Immediately prior to procedure a time out was called to verify the correct patient, procedure, equipment, support staff and site/side marked as required. Patient was prepped and draped in the usual sterile fashion.  ? ? ? ? ? ?

## 2021-03-31 NOTE — Telephone Encounter (Signed)
Please call pt back for medication refill on pantoprazole. Please call pt back.  ?

## 2021-04-02 ENCOUNTER — Encounter: Payer: Self-pay | Admitting: Orthopedic Surgery

## 2021-04-02 MED ORDER — LIDOCAINE HCL 1 % IJ SOLN
5.0000 mL | INTRAMUSCULAR | Status: AC | PRN
  Administered 2021-03-31: 5 mL

## 2021-04-02 MED ORDER — HYLAN G-F 20 48 MG/6ML IX SOSY
48.0000 mg | PREFILLED_SYRINGE | INTRA_ARTICULAR | Status: AC | PRN
  Administered 2021-03-31: 48 mg via INTRA_ARTICULAR

## 2021-04-03 ENCOUNTER — Encounter: Payer: Self-pay | Admitting: Neurology

## 2021-04-03 ENCOUNTER — Other Ambulatory Visit: Payer: Self-pay

## 2021-04-03 DIAGNOSIS — M79604 Pain in right leg: Secondary | ICD-10-CM

## 2021-04-06 ENCOUNTER — Ambulatory Visit
Admission: RE | Admit: 2021-04-06 | Discharge: 2021-04-06 | Disposition: A | Discharge status: Home / Self Care | Payer: Medicaid Other | Source: Ambulatory Visit | Attending: Physical Medicine and Rehabilitation | Admitting: Physical Medicine and Rehabilitation

## 2021-04-06 ENCOUNTER — Other Ambulatory Visit: Payer: Self-pay

## 2021-04-06 DIAGNOSIS — M542 Cervicalgia: Secondary | ICD-10-CM | POA: Diagnosis not present

## 2021-04-06 DIAGNOSIS — M5412 Radiculopathy, cervical region: Secondary | ICD-10-CM | POA: Diagnosis not present

## 2021-04-06 DIAGNOSIS — M47812 Spondylosis without myelopathy or radiculopathy, cervical region: Secondary | ICD-10-CM | POA: Diagnosis not present

## 2021-04-13 ENCOUNTER — Encounter: Payer: Medicaid Other | Admitting: Physical Medicine and Rehabilitation

## 2021-04-13 VITALS — BP 134/82 | HR 86 | Ht 63.0 in | Wt 218.0 lb

## 2021-04-13 DIAGNOSIS — M25512 Pain in left shoulder: Secondary | ICD-10-CM

## 2021-04-13 DIAGNOSIS — G8929 Other chronic pain: Secondary | ICD-10-CM

## 2021-04-13 DIAGNOSIS — M5412 Radiculopathy, cervical region: Secondary | ICD-10-CM | POA: Diagnosis not present

## 2021-04-13 MED ORDER — OXYCODONE-ACETAMINOPHEN 5-325 MG PO TABS: 1.0000 | ORAL_TABLET | Freq: Two times a day (BID) | ORAL | 0 refills | Status: DC | PRN
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
## 2021-04-25 ENCOUNTER — Other Ambulatory Visit: Payer: Self-pay | Admitting: Internal Medicine

## 2021-04-25 DIAGNOSIS — L309 Dermatitis, unspecified: Secondary | ICD-10-CM

## 2021-04-27 ENCOUNTER — Other Ambulatory Visit: Payer: Self-pay | Admitting: *Deleted

## 2021-04-27 DIAGNOSIS — Z8619 Personal history of other infectious and parasitic diseases: Secondary | ICD-10-CM

## 2021-04-28 ENCOUNTER — Ambulatory Visit: Payer: Medicaid Other | Admitting: Orthopedic Surgery

## 2021-04-28 MED ORDER — VALACYCLOVIR HCL 500 MG PO TABS: 500.0000 mg | ORAL_TABLET | Freq: Every day | ORAL | 3 refills | Status: DC

## 2021-05-01 ENCOUNTER — Ambulatory Visit (HOSPITAL_COMMUNITY)
Admission: EM | Admit: 2021-05-01 | Discharge: 2021-05-01 | Disposition: A | Discharge status: Home / Self Care | Payer: Medicaid Other | Attending: Student | Admitting: Student

## 2021-05-01 DIAGNOSIS — Z23 Encounter for immunization: Secondary | ICD-10-CM | POA: Insufficient documentation

## 2021-05-01 DIAGNOSIS — S50872A Other superficial bite of left forearm, initial encounter: Secondary | ICD-10-CM | POA: Insufficient documentation

## 2021-05-01 DIAGNOSIS — R238 Other skin changes: Secondary | ICD-10-CM | POA: Diagnosis not present

## 2021-05-01 DIAGNOSIS — W503XXA Accidental bite by another person, initial encounter: Secondary | ICD-10-CM | POA: Diagnosis not present

## 2021-05-01 DIAGNOSIS — Z114 Encounter for screening for human immunodeficiency virus [HIV]: Secondary | ICD-10-CM | POA: Diagnosis not present

## 2021-05-01 LAB — HEPATITIS PANEL, ACUTE
HCV Ab: NONREACTIVE
Hep A IgM: NONREACTIVE
Hep B C IgM: NONREACTIVE
Hepatitis B Surface Ag: NONREACTIVE

## 2021-05-01 LAB — HIV ANTIBODY (ROUTINE TESTING W REFLEX): HIV Screen 4th Generation wRfx: NONREACTIVE

## 2021-05-01 MED ORDER — DOXYCYCLINE HYCLATE 100 MG PO CAPS: 100.0000 mg | ORAL_CAPSULE | Freq: Two times a day (BID) | ORAL | 0 refills | Status: AC

## 2021-05-01 MED ORDER — TETANUS-DIPHTH-ACELL PERTUSSIS 5-2.5-18.5 LF-MCG/0.5 IM SUSY
PREFILLED_SYRINGE | INTRAMUSCULAR | Status: AC
  Filled 2021-05-01: qty 0.5

## 2021-05-01 MED ORDER — TETANUS-DIPHTH-ACELL PERTUSSIS 5-2.5-18.5 LF-MCG/0.5 IM SUSY
0.5000 mL | PREFILLED_SYRINGE | Freq: Once | INTRAMUSCULAR | Status: AC
  Administered 2021-05-01: 0.5 mL via INTRAMUSCULAR

## 2021-05-01 NOTE — Discharge Instructions (Addendum)
-  Doxycycline twice daily for 7 days.  Make sure to wear sunscreen while spending time outside while on this medication as it can increase your chance of sunburn. You can take this medication with food if you have a sensitive stomach. ?-Wash your wound with gentle soap and water 1-2 times daily.  Avoid cleansing with hydrogen peroxide or alcohol!! ?-Seek additional medical attention if the wound is getting worse instead of better- redness increasing in size, pain getting worse, new/worsening discharge, new fevers/chills, etc. ? ?
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
## 2021-05-01 NOTE — ED Triage Notes (Signed)
Pt needs a TDAP. ?
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
## 2021-05-04 ENCOUNTER — Encounter: Payer: Medicaid Other | Admitting: Neurology

## 2021-05-11 ENCOUNTER — Telehealth: Payer: Self-pay | Admitting: *Deleted

## 2021-05-11 ENCOUNTER — Other Ambulatory Visit: Payer: Self-pay | Admitting: Physical Medicine and Rehabilitation

## 2021-05-11 MED ORDER — OXYCODONE-ACETAMINOPHEN 5-325 MG PO TABS: 1.0000 | ORAL_TABLET | Freq: Two times a day (BID) | ORAL | 0 refills | Status: DC | PRN

## 2021-05-11 NOTE — Telephone Encounter (Signed)
Carrie Franco called for a refill on her percocet.  Her last fill date was 04/13/21. Her next appt is 08/08/21 ?

## 2021-05-15 ENCOUNTER — Encounter: Payer: Medicaid Other | Attending: Physical Medicine and Rehabilitation | Admitting: Registered Nurse

## 2021-05-15 ENCOUNTER — Encounter: Payer: Self-pay | Admitting: Registered Nurse

## 2021-05-15 VITALS — BP 131/80 | HR 85 | Ht 63.0 in | Wt 220.2 lb

## 2021-05-15 DIAGNOSIS — M545 Low back pain, unspecified: Secondary | ICD-10-CM | POA: Insufficient documentation

## 2021-05-15 DIAGNOSIS — M2550 Pain in unspecified joint: Secondary | ICD-10-CM | POA: Diagnosis not present

## 2021-05-15 DIAGNOSIS — Z5181 Encounter for therapeutic drug level monitoring: Secondary | ICD-10-CM | POA: Diagnosis not present

## 2021-05-15 DIAGNOSIS — M170 Bilateral primary osteoarthritis of knee: Secondary | ICD-10-CM | POA: Insufficient documentation

## 2021-05-15 DIAGNOSIS — Z79891 Long term (current) use of opiate analgesic: Secondary | ICD-10-CM | POA: Insufficient documentation

## 2021-05-15 DIAGNOSIS — G8929 Other chronic pain: Secondary | ICD-10-CM | POA: Insufficient documentation

## 2021-05-15 DIAGNOSIS — G894 Chronic pain syndrome: Secondary | ICD-10-CM | POA: Diagnosis not present

## 2021-05-15 MED ORDER — OXYCODONE-ACETAMINOPHEN 5-325 MG PO TABS: 1.0000 | ORAL_TABLET | Freq: Two times a day (BID) | ORAL | 0 refills | Status: DC | PRN
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
## 2021-05-17 ENCOUNTER — Ambulatory Visit: Payer: Medicaid Other | Admitting: Registered"

## 2021-05-26 ENCOUNTER — Other Ambulatory Visit: Payer: Self-pay

## 2021-05-26 MED ORDER — PREGABALIN 100 MG PO CAPS: 100.0000 mg | ORAL_CAPSULE | Freq: Two times a day (BID) | ORAL | 5 refills | Status: DC

## 2021-05-26 NOTE — Telephone Encounter (Signed)
pregabalin (LYRICA) 100 MG capsule, refill request @ [facility], [place]. ?

## 2021-05-29 ENCOUNTER — Other Ambulatory Visit: Payer: Self-pay

## 2021-05-29 MED ORDER — PREGABALIN 100 MG PO CAPS: 100.0000 mg | ORAL_CAPSULE | Freq: Two times a day (BID) | ORAL | 0 refills | Status: DC

## 2021-06-08 ENCOUNTER — Encounter: Payer: Self-pay | Admitting: Internal Medicine

## 2021-06-08 ENCOUNTER — Ambulatory Visit: Payer: Medicaid Other | Admitting: Internal Medicine

## 2021-06-08 DIAGNOSIS — M5412 Radiculopathy, cervical region: Secondary | ICD-10-CM | POA: Diagnosis not present

## 2021-06-08 DIAGNOSIS — L309 Dermatitis, unspecified: Secondary | ICD-10-CM

## 2021-06-08 DIAGNOSIS — W503XXD Accidental bite by another person, subsequent encounter: Secondary | ICD-10-CM

## 2021-06-08 DIAGNOSIS — Z8619 Personal history of other infectious and parasitic diseases: Secondary | ICD-10-CM | POA: Diagnosis not present

## 2021-06-08 MED ORDER — VALACYCLOVIR HCL 500 MG PO TABS: 500.0000 mg | ORAL_TABLET | Freq: Every day | ORAL | 3 refills | Status: DC

## 2021-06-08 MED ORDER — CYCLOBENZAPRINE HCL 5 MG PO TABS: 5.0000 mg | ORAL_TABLET | Freq: Two times a day (BID) | ORAL | 0 refills | Status: DC

## 2021-06-08 MED ORDER — MOXIFLOXACIN HCL 400 MG PO TABS: 400.0000 mg | ORAL_TABLET | Freq: Every day | ORAL | 0 refills | Status: AC

## 2021-06-08 MED ORDER — TRIAMCINOLONE ACETONIDE 0.1 % EX OINT: TOPICAL_OINTMENT | CUTANEOUS | 0 refills | Status: DC
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
## 2021-06-09 DIAGNOSIS — W503XXA Accidental bite by another person, initial encounter: Secondary | ICD-10-CM | POA: Insufficient documentation
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
## 2021-06-09 NOTE — Assessment & Plan Note (Signed)
Refilled her triamcinolone today

## 2021-06-13 ENCOUNTER — Other Ambulatory Visit: Payer: Self-pay

## 2021-06-13 MED ORDER — OXYCODONE-ACETAMINOPHEN 5-325 MG PO TABS: 1.0000 | ORAL_TABLET | Freq: Two times a day (BID) | ORAL | 0 refills | Status: DC | PRN

## 2021-06-13 NOTE — Telephone Encounter (Signed)
Filled  Written  ID  Drug  QTY  Days  Prescriber  RX #  Dispenser  Refill  Daily Dose*  Pymt Type  PMP  05/29/2021 05/29/2021 2  Pregabalin 100 Mg Capsule 60.00 30 Mo Con [id number] Sam ([id number]) 0/5 1.34 LME Medicaid [place] 05/11/2021 05/11/2021 2  Oxycodone-Acetaminophen 5-325 120.00 30 Kr Rau [id number] Sam ([id number]) 0/0 30.00 MME Medicaid 

## 2021-06-20 NOTE — Progress Notes (Signed)
Internal Medicine Clinic Attending  I saw and evaluated the patient.  I personally confirmed the key portions of the history and exam documented by Dr. DeMaio   and I reviewed pertinent patient test results.  The assessment, diagnosis, and plan were formulated together and I agree with the documentation in the resident's note.  

## 2021-06-27 ENCOUNTER — Other Ambulatory Visit: Payer: Self-pay | Admitting: Internal Medicine

## 2021-06-27 DIAGNOSIS — K219 Gastro-esophageal reflux disease without esophagitis: Secondary | ICD-10-CM

## 2021-07-03 ENCOUNTER — Other Ambulatory Visit: Payer: Self-pay | Admitting: Internal Medicine

## 2021-07-03 ENCOUNTER — Encounter: Payer: Medicaid Other | Admitting: Internal Medicine

## 2021-07-03 DIAGNOSIS — Z1231 Encounter for screening mammogram for malignant neoplasm of breast: Secondary | ICD-10-CM

## 2021-07-04 ENCOUNTER — Encounter: Payer: Self-pay | Admitting: Internal Medicine

## 2021-07-06 ENCOUNTER — Encounter: Payer: Self-pay | Admitting: Registered Nurse

## 2021-07-06 ENCOUNTER — Encounter: Payer: Medicaid Other | Attending: Physical Medicine and Rehabilitation | Admitting: Registered Nurse

## 2021-07-06 VITALS — BP 130/84 | HR 86 | Ht 63.0 in | Wt 214.0 lb

## 2021-07-06 DIAGNOSIS — M545 Low back pain, unspecified: Secondary | ICD-10-CM | POA: Diagnosis not present

## 2021-07-06 DIAGNOSIS — M2550 Pain in unspecified joint: Secondary | ICD-10-CM | POA: Diagnosis not present

## 2021-07-06 DIAGNOSIS — G8929 Other chronic pain: Secondary | ICD-10-CM | POA: Insufficient documentation

## 2021-07-06 DIAGNOSIS — G894 Chronic pain syndrome: Secondary | ICD-10-CM | POA: Insufficient documentation

## 2021-07-06 DIAGNOSIS — Z79891 Long term (current) use of opiate analgesic: Secondary | ICD-10-CM | POA: Diagnosis not present

## 2021-07-06 DIAGNOSIS — Z5181 Encounter for therapeutic drug level monitoring: Secondary | ICD-10-CM

## 2021-07-06 DIAGNOSIS — F5101 Primary insomnia: Secondary | ICD-10-CM | POA: Diagnosis not present

## 2021-07-06 DIAGNOSIS — M170 Bilateral primary osteoarthritis of knee: Secondary | ICD-10-CM | POA: Diagnosis not present

## 2021-07-06 MED ORDER — OXYCODONE-ACETAMINOPHEN 5-325 MG PO TABS: 1.0000 | ORAL_TABLET | Freq: Two times a day (BID) | ORAL | 0 refills | Status: DC | PRN
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
## 2021-07-11 ENCOUNTER — Ambulatory Visit
Admission: RE | Admit: 2021-07-11 | Discharge: 2021-07-11 | Disposition: A | Discharge status: Home / Self Care | Payer: Medicaid Other | Source: Ambulatory Visit | Attending: Internal Medicine | Admitting: Internal Medicine

## 2021-07-11 DIAGNOSIS — Z1231 Encounter for screening mammogram for malignant neoplasm of breast: Secondary | ICD-10-CM | POA: Diagnosis not present

## 2021-07-12 LAB — TOXASSURE SELECT,+ANTIDEPR,UR

## 2021-07-13 ENCOUNTER — Telehealth: Payer: Self-pay | Admitting: *Deleted

## 2021-07-13 NOTE — Telephone Encounter (Signed)
Urine drug screen for this encounter is consistent for prescribed medication 

## 2021-07-14 ENCOUNTER — Telehealth: Payer: Self-pay

## 2021-07-14 NOTE — Telephone Encounter (Signed)
Carrie Franco called back for a follow up on her sleep medication. She thought you were going to send in an script to help her.     Call back phone [phone].

## 2021-07-17 NOTE — Telephone Encounter (Addendum)
Placed a call to Ms. Mandigo Amitriptyline and Trazodone interaction with her Prozac. She verbalizes understanding.

## 2021-07-17 NOTE — Addendum Note (Signed)
Addended by: Bayard Hugger on: 07/17/2021 05:28 PM   Modules accepted: Orders

## 2021-07-18 ENCOUNTER — Encounter: Payer: Self-pay | Admitting: Registered Nurse

## 2021-07-22 ENCOUNTER — Encounter (HOSPITAL_BASED_OUTPATIENT_CLINIC_OR_DEPARTMENT_OTHER): Payer: Self-pay | Admitting: Emergency Medicine

## 2021-07-22 ENCOUNTER — Emergency Department (HOSPITAL_BASED_OUTPATIENT_CLINIC_OR_DEPARTMENT_OTHER): Payer: Medicaid Other

## 2021-07-22 ENCOUNTER — Other Ambulatory Visit: Payer: Self-pay

## 2021-07-22 ENCOUNTER — Emergency Department (HOSPITAL_BASED_OUTPATIENT_CLINIC_OR_DEPARTMENT_OTHER)
Admission: EM | Admit: 2021-07-22 | Discharge: 2021-07-23 | Disposition: A | Discharge status: Home / Self Care | Payer: Medicaid Other | Attending: Emergency Medicine | Admitting: Emergency Medicine

## 2021-07-22 DIAGNOSIS — R519 Headache, unspecified: Secondary | ICD-10-CM | POA: Insufficient documentation

## 2021-07-22 DIAGNOSIS — J45909 Unspecified asthma, uncomplicated: Secondary | ICD-10-CM | POA: Diagnosis not present

## 2021-07-22 DIAGNOSIS — I10 Essential (primary) hypertension: Secondary | ICD-10-CM | POA: Insufficient documentation

## 2021-07-22 DIAGNOSIS — Z79899 Other long term (current) drug therapy: Secondary | ICD-10-CM | POA: Diagnosis not present

## 2021-07-22 DIAGNOSIS — H53149 Visual discomfort, unspecified: Secondary | ICD-10-CM | POA: Diagnosis not present

## 2021-07-22 DIAGNOSIS — Z87891 Personal history of nicotine dependence: Secondary | ICD-10-CM | POA: Insufficient documentation

## 2021-07-22 MED ORDER — METOCLOPRAMIDE HCL 5 MG/ML IJ SOLN
10.0000 mg | Freq: Once | INTRAMUSCULAR | Status: AC
  Administered 2021-07-23: 10 mg via INTRAVENOUS
  Filled 2021-07-22: qty 2

## 2021-07-22 MED ORDER — SODIUM CHLORIDE 0.9 % IV BOLUS
1000.0000 mL | Freq: Once | INTRAVENOUS | Status: AC
  Administered 2021-07-23: 1000 mL via INTRAVENOUS

## 2021-07-22 MED ORDER — DIPHENHYDRAMINE HCL 50 MG/ML IJ SOLN
25.0000 mg | Freq: Once | INTRAMUSCULAR | Status: AC
  Administered 2021-07-23: 25 mg via INTRAVENOUS
  Filled 2021-07-22: qty 1
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
## 2021-07-22 NOTE — ED Triage Notes (Signed)
Reports headache since yesterday.  Took aleve with no relief.  Took tylenol pm last night with no relief.  Reports heat helped some.
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
## 2021-08-04 ENCOUNTER — Other Ambulatory Visit: Payer: Self-pay | Admitting: Internal Medicine

## 2021-08-08 ENCOUNTER — Encounter
Payer: Medicaid Other | Attending: Physical Medicine and Rehabilitation | Admitting: Physical Medicine and Rehabilitation

## 2021-08-08 VITALS — Ht 63.0 in | Wt 217.0 lb

## 2021-08-08 DIAGNOSIS — G894 Chronic pain syndrome: Secondary | ICD-10-CM | POA: Insufficient documentation

## 2021-08-08 DIAGNOSIS — Z6838 Body mass index (BMI) 38.0-38.9, adult: Secondary | ICD-10-CM | POA: Insufficient documentation

## 2021-08-08 MED ORDER — DICLOFENAC SODIUM 1 % EX GEL: 2.0000 g | Freq: Four times a day (QID) | CUTANEOUS | 3 refills | Status: DC

## 2021-08-08 MED ORDER — OXYCODONE-ACETAMINOPHEN 5-325 MG PO TABS: 1.0000 | ORAL_TABLET | Freq: Two times a day (BID) | ORAL | 0 refills | Status: DC | PRN

## 2021-08-08 NOTE — Patient Instructions (Signed)
Blue emu oil 

## 2021-08-15 ENCOUNTER — Ambulatory Visit: Payer: Medicaid Other | Admitting: Neurology

## 2021-08-15 DIAGNOSIS — M79604 Pain in right leg: Secondary | ICD-10-CM | POA: Diagnosis not present

## 2021-08-15 NOTE — Procedures (Signed)
[facility]  [place]  [place], [place] [address] Tel: [phone] Fax:  [phone] Test Date:  08/15/2021  Patient: Carrie Franco DOB: May 15, 1965 Physician: Narda Amber, DO  Sex: Female Height: '5\' 3"'$  Ref Phys: Marcene Duos, MD  ID#:    Technician:    Patient Complaints: This is a 56 year old female referred for evaluation of right leg pain and paresthesias.  NCV & EMG Findings: Extensive electrodiagnostic testing of the right lower extremity shows:  Right sural and superficial peroneal sensory responses are within normal limits. Right peroneal and tibial motor responses are within normal limits. Right tibial H reflex study is within normal limits. Needle electrode examination was prematurely terminated due to pain, per patient's request.    Impression: This is a limited study.  Nerve conduction studies of the right lower extremity shows no evidence of a peroneal mononeuropathy or peripheral neuropathy. Needle electrode examination was not performed at patient's request due to pain.   ___________________________ Narda Amber, DO    Nerve Conduction Studies Anti Sensory Summary Table   Stim Site NR Peak (ms) Norm Peak (ms) P-T Amp (V) Norm P-T Amp  Right Sup Peroneal Anti Sensory (Ant Lat Mall)  32C  12 cm    2.2 <4.6 12.9 >4  Right Sural Anti Sensory (Lat Mall)  32C  Calf    2.9 <4.6 22.2 >4   Motor Summary Table   Stim Site NR Onset (ms) Norm Onset (ms) O-P Amp (mV) Norm O-P Amp Site1 Site2 Delta-0 (ms) Dist (cm) Vel (m/s) Norm Vel (m/s)  Right Peroneal Motor (Ext Dig Brev)  32C  Ankle    2.9 <6.0 3.6 >2.5 B Fib Ankle 6.3 36.0 57 >40  B Fib    9.2  3.3  Poplt B Fib 1.4 9.0 64 >40  Poplt    10.6  3.1         Right Peroneal TA Motor (Tib Ant)  32C  Fib Head    1.7 <4.5 8.4 >3 Poplit Fib Head 1.7 10.0 59 >40  Poplit    3.4  8.1         Right Tibial Motor (Abd [place] Brev)  32C  Ankle    2.4 <6.0 11.0 >4 Knee Ankle 7.2 40.0 56 >40   Knee    9.6  8.1          H Reflex Studies   NR H-Lat (ms) Lat Norm (ms) L-R H-Lat (ms)  Right Tibial (Gastroc)  32C     32.79 <35       Waveforms:

## 2021-08-21 ENCOUNTER — Telehealth: Payer: Self-pay | Admitting: Neurology

## 2021-08-21 NOTE — Telephone Encounter (Signed)
Patient was sent for EMG because of leg pain and limited to nerve conduction study only, as she was unable to proceed with needle exam.  There is no residual pain with nerve conduction study.  Recommend follow-up with referring doctor.

## 2021-08-21 NOTE — Telephone Encounter (Signed)
Called patient and informed her that she was sent for EMG because of leg pain and limited to nerve conduction study only, as she was unable to proceed with needle exam.  There is no residual pain with nerve conduction study.  Recommend follow-up with referring doctor. Patient verbalized understanding and had no further questions or concerns.

## 2021-08-21 NOTE — Telephone Encounter (Signed)
Patient called and stated she had an EMG last week.  She says that her leg is hurting really bad since then.  Please return call.

## 2021-08-24 ENCOUNTER — Ambulatory Visit: Payer: Medicaid Other | Admitting: Physical Medicine and Rehabilitation

## 2021-09-02 ENCOUNTER — Other Ambulatory Visit: Payer: Self-pay | Admitting: Internal Medicine

## 2021-09-05 ENCOUNTER — Encounter: Payer: Medicaid Other | Admitting: Registered Nurse

## 2021-09-06 ENCOUNTER — Encounter: Payer: Medicaid Other | Attending: Physical Medicine and Rehabilitation | Admitting: Registered Nurse

## 2021-09-06 ENCOUNTER — Telehealth: Payer: Self-pay | Admitting: *Deleted

## 2021-09-06 ENCOUNTER — Encounter: Payer: Self-pay | Admitting: Registered Nurse

## 2021-09-06 VITALS — BP 134/86 | HR 71 | Wt 215.0 lb

## 2021-09-06 DIAGNOSIS — G8929 Other chronic pain: Secondary | ICD-10-CM | POA: Insufficient documentation

## 2021-09-06 DIAGNOSIS — Z79891 Long term (current) use of opiate analgesic: Secondary | ICD-10-CM | POA: Insufficient documentation

## 2021-09-06 DIAGNOSIS — M2550 Pain in unspecified joint: Secondary | ICD-10-CM | POA: Insufficient documentation

## 2021-09-06 DIAGNOSIS — G894 Chronic pain syndrome: Secondary | ICD-10-CM | POA: Diagnosis not present

## 2021-09-06 DIAGNOSIS — Z5181 Encounter for therapeutic drug level monitoring: Secondary | ICD-10-CM | POA: Diagnosis not present

## 2021-09-06 DIAGNOSIS — M545 Low back pain, unspecified: Secondary | ICD-10-CM | POA: Diagnosis not present

## 2021-09-06 DIAGNOSIS — M170 Bilateral primary osteoarthritis of knee: Secondary | ICD-10-CM | POA: Diagnosis not present

## 2021-09-06 MED ORDER — OXYCODONE-ACETAMINOPHEN 5-325 MG PO TABS: 1.0000 | ORAL_TABLET | Freq: Two times a day (BID) | ORAL | 0 refills | Status: DC | PRN
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
## 2021-09-06 NOTE — Telephone Encounter (Signed)
Approved 09/06/21 through 03/05/22.

## 2021-09-06 NOTE — Telephone Encounter (Signed)
Prior auth submitted to [employer] via CoverMyMeds for oxycodone acetaminophen 5/325 #120.

## 2021-09-07 ENCOUNTER — Encounter: Payer: Self-pay | Admitting: Registered Nurse

## 2021-09-11 ENCOUNTER — Other Ambulatory Visit: Payer: Self-pay | Admitting: Internal Medicine

## 2021-09-11 DIAGNOSIS — L309 Dermatitis, unspecified: Secondary | ICD-10-CM

## 2021-09-15 ENCOUNTER — Ambulatory Visit (INDEPENDENT_AMBULATORY_CARE_PROVIDER_SITE_OTHER): Payer: Medicaid Other

## 2021-09-15 ENCOUNTER — Telehealth: Payer: Self-pay

## 2021-09-15 ENCOUNTER — Ambulatory Visit: Payer: Medicaid Other | Admitting: Orthopedic Surgery

## 2021-09-15 DIAGNOSIS — M1711 Unilateral primary osteoarthritis, right knee: Secondary | ICD-10-CM | POA: Diagnosis not present

## 2021-09-15 DIAGNOSIS — M79604 Pain in right leg: Secondary | ICD-10-CM

## 2021-09-15 DIAGNOSIS — M5410 Radiculopathy, site unspecified: Secondary | ICD-10-CM

## 2021-09-15 NOTE — Telephone Encounter (Signed)
Duplicate

## 2021-09-15 NOTE — Telephone Encounter (Signed)
Auth needed for bilat knee gel injections  

## 2021-09-15 NOTE — Telephone Encounter (Signed)
VOB submitted for SynviscOne, bilateral knee  

## 2021-09-17 ENCOUNTER — Encounter: Payer: Self-pay | Admitting: Orthopedic Surgery
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
## 2021-09-19 ENCOUNTER — Other Ambulatory Visit: Payer: Self-pay | Admitting: Internal Medicine

## 2021-09-19 DIAGNOSIS — J309 Allergic rhinitis, unspecified: Secondary | ICD-10-CM

## 2021-09-19 MED ORDER — LIDOCAINE HCL 1 % IJ SOLN
5.0000 mL | INTRAMUSCULAR | Status: AC | PRN
  Administered 2021-09-15: 5 mL

## 2021-09-19 MED ORDER — BUPIVACAINE HCL 0.25 % IJ SOLN
4.0000 mL | INTRAMUSCULAR | Status: AC | PRN
  Administered 2021-09-15: 4 mL via INTRA_ARTICULAR

## 2021-09-19 MED ORDER — METHYLPREDNISOLONE ACETATE 40 MG/ML IJ SUSP
40.0000 mg | INTRAMUSCULAR | Status: AC | PRN
  Administered 2021-09-15: 40 mg via INTRA_ARTICULAR

## 2021-09-29 ENCOUNTER — Encounter
Payer: Medicaid Other | Attending: Physical Medicine and Rehabilitation | Admitting: Physical Medicine and Rehabilitation

## 2021-09-29 DIAGNOSIS — M2550 Pain in unspecified joint: Secondary | ICD-10-CM | POA: Insufficient documentation

## 2021-09-29 DIAGNOSIS — Z5181 Encounter for therapeutic drug level monitoring: Secondary | ICD-10-CM | POA: Insufficient documentation

## 2021-09-29 DIAGNOSIS — G8929 Other chronic pain: Secondary | ICD-10-CM | POA: Insufficient documentation

## 2021-09-29 DIAGNOSIS — M545 Low back pain, unspecified: Secondary | ICD-10-CM | POA: Insufficient documentation

## 2021-09-29 DIAGNOSIS — Z79891 Long term (current) use of opiate analgesic: Secondary | ICD-10-CM | POA: Insufficient documentation

## 2021-09-29 DIAGNOSIS — M170 Bilateral primary osteoarthritis of knee: Secondary | ICD-10-CM | POA: Insufficient documentation

## 2021-09-29 DIAGNOSIS — G894 Chronic pain syndrome: Secondary | ICD-10-CM | POA: Insufficient documentation

## 2021-09-29 DIAGNOSIS — G629 Polyneuropathy, unspecified: Secondary | ICD-10-CM | POA: Insufficient documentation

## 2021-10-03 ENCOUNTER — Telehealth: Payer: Self-pay

## 2021-10-03 NOTE — Telephone Encounter (Signed)
Talked with [name] with [employer] to initiate PA for SynviscOne, bilateral knee, but advised that patient on has pharmacy benefits for Rx's only and patient has no medical benefits.  Advised patient of message above and patient stated that she has medicaid.  Advised patient that medicaid does not cover any gel injections. Patient voiced that she understands.

## 2021-10-04 ENCOUNTER — Other Ambulatory Visit: Payer: Self-pay | Admitting: Internal Medicine

## 2021-10-04 ENCOUNTER — Ambulatory Visit
Admission: RE | Admit: 2021-10-04 | Discharge: 2021-10-04 | Disposition: A | Discharge status: Home / Self Care | Payer: Medicaid Other | Source: Ambulatory Visit | Attending: Orthopedic Surgery | Admitting: Orthopedic Surgery

## 2021-10-04 DIAGNOSIS — M545 Low back pain, unspecified: Secondary | ICD-10-CM | POA: Diagnosis not present

## 2021-10-04 DIAGNOSIS — M79604 Pain in right leg: Secondary | ICD-10-CM

## 2021-10-04 DIAGNOSIS — M4316 Spondylolisthesis, lumbar region: Secondary | ICD-10-CM | POA: Diagnosis not present

## 2021-10-04 DIAGNOSIS — L309 Dermatitis, unspecified: Secondary | ICD-10-CM

## 2021-10-11 ENCOUNTER — Encounter: Payer: Medicaid Other | Admitting: Registered Nurse

## 2021-10-11 VITALS — BP 135/80 | HR 74 | Ht 63.0 in | Wt 216.6 lb

## 2021-10-11 DIAGNOSIS — M2550 Pain in unspecified joint: Secondary | ICD-10-CM

## 2021-10-11 DIAGNOSIS — G629 Polyneuropathy, unspecified: Secondary | ICD-10-CM | POA: Diagnosis not present

## 2021-10-11 DIAGNOSIS — G894 Chronic pain syndrome: Secondary | ICD-10-CM

## 2021-10-11 DIAGNOSIS — Z5181 Encounter for therapeutic drug level monitoring: Secondary | ICD-10-CM | POA: Diagnosis not present

## 2021-10-11 DIAGNOSIS — G8929 Other chronic pain: Secondary | ICD-10-CM

## 2021-10-11 DIAGNOSIS — M170 Bilateral primary osteoarthritis of knee: Secondary | ICD-10-CM

## 2021-10-11 DIAGNOSIS — Z79891 Long term (current) use of opiate analgesic: Secondary | ICD-10-CM

## 2021-10-11 DIAGNOSIS — M545 Low back pain, unspecified: Secondary | ICD-10-CM

## 2021-10-11 MED ORDER — OXYCODONE-ACETAMINOPHEN 5-325 MG PO TABS: 1.0000 | ORAL_TABLET | Freq: Two times a day (BID) | ORAL | 0 refills | Status: DC | PRN
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
## 2021-10-12 ENCOUNTER — Ambulatory Visit: Payer: Medicaid Other | Admitting: Surgical

## 2021-10-12 ENCOUNTER — Encounter: Payer: Self-pay | Admitting: Registered Nurse

## 2021-10-12 ENCOUNTER — Encounter: Payer: Self-pay | Admitting: Orthopedic Surgery

## 2021-10-12 DIAGNOSIS — M47816 Spondylosis without myelopathy or radiculopathy, lumbar region: Secondary | ICD-10-CM | POA: Diagnosis not present

## 2021-10-12 DIAGNOSIS — M170 Bilateral primary osteoarthritis of knee: Secondary | ICD-10-CM

## 2021-10-12 DIAGNOSIS — M1712 Unilateral primary osteoarthritis, left knee: Secondary | ICD-10-CM | POA: Diagnosis not present

## 2021-10-12 MED ORDER — BUPIVACAINE HCL 0.25 % IJ SOLN
4.0000 mL | INTRAMUSCULAR | Status: AC | PRN
  Administered 2021-10-12: 4 mL via INTRA_ARTICULAR

## 2021-10-12 MED ORDER — METHYLPREDNISOLONE ACETATE 40 MG/ML IJ SUSP
40.0000 mg | INTRAMUSCULAR | Status: AC | PRN
  Administered 2021-10-12: 40 mg via INTRA_ARTICULAR

## 2021-10-12 MED ORDER — LIDOCAINE HCL 1 % IJ SOLN
5.0000 mL | INTRAMUSCULAR | Status: AC | PRN
  Administered 2021-10-12: 5 mL
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
## 2021-10-12 NOTE — Addendum Note (Signed)
Addended byLaurann Montana on: 10/12/2021 09:13 AM   Modules accepted: Orders

## 2021-10-19 ENCOUNTER — Ambulatory Visit: Payer: Medicaid Other | Admitting: Physical Medicine and Rehabilitation

## 2021-10-19 ENCOUNTER — Ambulatory Visit (INDEPENDENT_AMBULATORY_CARE_PROVIDER_SITE_OTHER): Payer: Medicaid Other | Admitting: Physical Medicine and Rehabilitation

## 2021-10-19 DIAGNOSIS — G894 Chronic pain syndrome: Secondary | ICD-10-CM | POA: Diagnosis not present

## 2021-10-19 DIAGNOSIS — M47816 Spondylosis without myelopathy or radiculopathy, lumbar region: Secondary | ICD-10-CM | POA: Diagnosis not present

## 2021-10-19 DIAGNOSIS — G8929 Other chronic pain: Secondary | ICD-10-CM

## 2021-10-19 DIAGNOSIS — M545 Low back pain, unspecified: Secondary | ICD-10-CM | POA: Diagnosis not present
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
## 2021-10-19 NOTE — Progress Notes (Signed)
Patient here today to discuss facet injections.

## 2021-10-23 ENCOUNTER — Telehealth: Payer: Self-pay

## 2021-10-23 NOTE — Telephone Encounter (Signed)
Mrs. Urbanski called back as you advised her to do. However she does not know the reason why. She stated she has enough pain medication and follow up appointments. Please advise for the follow up call.   Thank you, Call back phone [phone].

## 2021-10-24 ENCOUNTER — Other Ambulatory Visit: Payer: Self-pay | Admitting: Internal Medicine

## 2021-10-24 DIAGNOSIS — L309 Dermatitis, unspecified: Secondary | ICD-10-CM

## 2021-10-24 NOTE — Telephone Encounter (Signed)
Next appt scheduled 10/19 with Dr Lisabeth Devoid.

## 2021-10-25 ENCOUNTER — Encounter: Payer: Self-pay | Admitting: Physical Medicine and Rehabilitation

## 2021-11-02 ENCOUNTER — Encounter: Payer: Medicaid Other | Admitting: Student

## 2021-11-09 ENCOUNTER — Other Ambulatory Visit: Payer: Self-pay | Admitting: Internal Medicine

## 2021-11-13 ENCOUNTER — Other Ambulatory Visit: Payer: Self-pay

## 2021-11-13 MED ORDER — OXYCODONE-ACETAMINOPHEN 5-325 MG PO TABS: 1.0000 | ORAL_TABLET | Freq: Two times a day (BID) | ORAL | 0 refills | Status: DC | PRN

## 2021-11-13 NOTE — Telephone Encounter (Signed)
PMP was Reviewed.  Oxycodone e-scribed to pharmacy, she has an appointment with Dr Ranell Patrick on 11/23/2021.  Call placed to Carrie Franco regarding the above, she verbalizes understanding.

## 2021-11-15 ENCOUNTER — Other Ambulatory Visit: Payer: Self-pay

## 2021-11-15 ENCOUNTER — Ambulatory Visit (INDEPENDENT_AMBULATORY_CARE_PROVIDER_SITE_OTHER): Payer: Medicaid Other

## 2021-11-15 VITALS — BP 130/65 | HR 78 | Temp 98.1°F | Ht 63.0 in | Wt 221.9 lb

## 2021-11-15 DIAGNOSIS — R040 Epistaxis: Secondary | ICD-10-CM | POA: Diagnosis not present

## 2021-11-15 DIAGNOSIS — Z87891 Personal history of nicotine dependence: Secondary | ICD-10-CM | POA: Diagnosis not present

## 2021-11-15 DIAGNOSIS — M5417 Radiculopathy, lumbosacral region: Secondary | ICD-10-CM | POA: Diagnosis not present

## 2021-11-15 DIAGNOSIS — F331 Major depressive disorder, recurrent, moderate: Secondary | ICD-10-CM

## 2021-11-15 DIAGNOSIS — I10 Essential (primary) hypertension: Secondary | ICD-10-CM | POA: Diagnosis not present

## 2021-11-15 DIAGNOSIS — E785 Hyperlipidemia, unspecified: Secondary | ICD-10-CM | POA: Diagnosis not present

## 2021-11-15 DIAGNOSIS — E041 Nontoxic single thyroid nodule: Secondary | ICD-10-CM | POA: Diagnosis not present

## 2021-11-15 HISTORY — DX: Epistaxis: R04.0

## 2021-11-15 NOTE — Progress Notes (Deleted)
HTN Amlodipine 10 Olmesartan 40  Persistent asthma Albuterol  GERD Protonix 40  Thyroid nodule U/S ordered, never completed??  Trigeminal neuralgia Carbamazapine?  Cervical radiculopathylumbosacral radiculopathy  Patient does take Percocet 5-325 mg for moderate/severe pain that is currently prescribed by Danella Sensing, NP at [facility] MRI with trace L4-5 anterolisthesis and facet arthropathy Offered injections, will hold off for now flexeril  Osteoarthritis Referred to [facility]?? Injections at this time  Genital HSV Valtrex  HLD Ldl 175 1 yr ago, 93 11/2020 Leg cramps on atorvastatin and pravastatin Referred to nutrition  Neuropathic pain Lyrica 100 BID  Adenomatous polyps Found in 04/2020. Colonoscopy in 3 years (04/2023)  HCM Flu shingrix

## 2021-11-15 NOTE — Assessment & Plan Note (Signed)
Well controlled BP at 130/65. Will check electrolytes and renal function. -Amlodipine 10 -Olmesartan 40 -BMP

## 2021-11-15 NOTE — Patient Instructions (Signed)
Thank you, Carrie Franco for allowing [place] to provide your care today. Today we discussed :  Nose bleeds- This is likely due to the cold weather and turning on the heat indoors causing dryness. You can put vasaline at the opening of the nostrils. You can also stop flonase since you do not need it. Please have this checked out again if bleeding is coming out of nostrils frequently or if you are starting to swallow blood from posterior drainage.  Thyroid nodule- You have a 1.2 cm thyroid nodule. We will get an ultrasound of this scheduled. You should get a call from the imaging center to set this up.  High cholesterol- We would like to check your cholesterol once yearly. If it is above 190 we should talk about treating this.  High blood pressure- Your blood pressure looked good today! We like to check kidney function once per year for this.  I have ordered the following labs for you:   Lab Orders         BMP8+Anion Gap         Lipid Profile        Referrals ordered today:   Referral Orders  No referral(s) requested today     I have ordered the following medication/changed the following medications:   Stop the following medications: There are no discontinued medications.   Start the following medications: No orders of the defined types were placed in this encounter.    Follow up: 6 months    We look forward to seeing you next time. Please call our clinic at [phone] if you have any questions or concerns. The best time to call is Monday-Friday from 9am-4pm, but there is someone available 24/7. If after hours or the weekend, call the main hospital number and ask for the Internal Medicine Resident On-Call. If you need medication refills, please notify your pharmacy one week in advance and they will send [place] a request.   Thank you for trusting me with your care. Wishing you the best!   Iona Coach, MD [place]

## 2021-11-15 NOTE — Assessment & Plan Note (Signed)
Messaged Carrie Franco to help get patient scheduled for ultrasound.

## 2021-11-15 NOTE — Assessment & Plan Note (Addendum)
Patient reports 2 weeks of left sided nose bleeds when she blows her nose. She denies profuse bleeding such as blood posteriorly into the oropharynx or leakage of blood out of the nare. She denies congestion, nasal drainage, sneezing, cough, shortness of breath. She uses flonase once per week. 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 She says this has never happened to her. Given the acute onset, lack of profuse bleeding,  recent cold weather and use of indoor heat do suspect this is from the nasal turbinates getting irritated from dryness. On exam there was no congestion or nasal drainage, no polyps,no evidence of bleeding or trauma,presence bilateral hyperemia of the turbinates. Counseled the patient to return should overt bleeding out of the nares or into the oropharynx occur, otherwise place vaseline around the opening of the nares, avoid excessive nose blowing and trauma, and continue to monitor.

## 2021-11-15 NOTE — Progress Notes (Signed)
Established Patient Office Visit  Subjective   Patient ID: Carrie Franco, female    DOB: Jun 21, 1965  Age: 56 y.o. MRN: [id number]  Chief Complaint  Patient presents with   Follow-up    Carrie Franco is a 56 y/o female with a pmh outlined below. Please see encounter tab for HPI and A/P information.      Review of Systems  All other systems reviewed and are negative.     Objective:     BP 130/65 (BP Location: Left Arm, Patient Position: Sitting, Cuff Size: Normal)   Pulse 78   Temp 98.1 F (36.7 C) (Oral)   Ht '5\' 3"'$  (1.6 m)   Wt 221 lb 14.4 oz (100.7 kg)   LMP 11/22/2012   SpO2 97%   BMI 39.31 kg/m    Physical Exam Constitutional:      General: She is not in acute distress.    Appearance: Normal appearance. She is obese.  HENT:     Head: Normocephalic and atraumatic.     Right Ear: Ear canal and external ear normal. There is no impacted cerumen.     Left Ear: Ear canal and external ear normal. There is no impacted cerumen.     Ears:     Comments: Hyperemia of bilateral TMs with no visible effusion, bulging, retraction,perforation or opacification    Nose: No congestion or rhinorrhea.     Comments: Hyperemia of bilateral nasal turbinates, nasal septal perforation, no polyps, no trauma, no bleeding, no evidence of congestion or drainage    Mouth/Throat:     Mouth: Mucous membranes are dry.     Pharynx: Oropharynx is clear. No oropharyngeal exudate or posterior oropharyngeal erythema.  Eyes:     General: No scleral icterus.       Right eye: No discharge.        Left eye: No discharge.     Extraocular Movements: Extraocular movements intact.     Conjunctiva/sclera: Conjunctivae normal.  Cardiovascular:     Rate and Rhythm: Normal rate and regular rhythm.     Pulses: Normal pulses.     Heart sounds: Normal heart sounds. No murmur heard.    No gallop.  Pulmonary:     Effort: Pulmonary effort is normal. No respiratory distress.     Breath sounds: Normal breath  sounds. No wheezing or rales.  Skin:    Capillary Refill: Capillary refill takes less than 2 seconds.  Neurological:     Mental Status: She is alert.      No results found for any visits on 11/15/21.    The 10-year ASCVD risk score (Arnett DK, et al., 2019) is: 5.4%    Assessment & Plan:   Problem List Items Addressed This Visit       Cardiovascular and Mediastinum   Essential hypertension - Primary (Chronic)    Well controlled BP at 130/65. Will check electrolytes and renal function. -Amlodipine 10 -Olmesartan 40 -BMP      Relevant Orders   BMP8+Anion Gap     Endocrine   Thyroid nodule    Messaged Carrie Franco to help get patient scheduled for ultrasound.        Nervous and Auditory   Radiculopathy of lumbosacral region     Other   Hyperlipidemia    LDL was 175 02/2020 and 93 11/2020. During that time the patient was started on atorvastatin and transitioned to pravastatin. She is currently not on medication due to inability to tolerate cramps. She  has controlled HTN, no diabetes, and ASCVD risk of <5%. If LDL is <190 will not start medications given low overall risk. If >190 may need to consider ezetemibe consider 2x treatment failure with statins. -lipid panel      Relevant Orders   Lipid Profile   Epistaxis    Patient reports 2 weeks of left sided nose bleeds when she blows her nose. She denies profuse bleeding such as blood posteriorly into the oropharynx or leakage of blood out of the nare. She denies congestion, nasal drainage, sneezing, cough, shortness of breath. She uses flonase once per week. 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 She says this has never happened to her. Given the acute onset, lack of profuse bleeding,  recent cold weather and use of indoor heat do suspect this is from the nasal turbinates getting irritated from dryness. On exam there was no congestion or nasal drainage,  no polyps,no evidence of bleeding or trauma,presence bilateral hyperemia of the turbinates. Counseled the patient to return should overt bleeding out of the nares or into the oropharynx occur, otherwise place vaseline around the opening of the nares, avoid excessive nose blowing and trauma, and continue to monitor.       Return in about 6 months (around 05/16/2022).    Iona Coach, MD

## 2021-11-15 NOTE — Assessment & Plan Note (Signed)
LDL was 175 02/2020 and 93 11/2020. During that time the patient was started on atorvastatin and transitioned to pravastatin. She is currently not on medication due to inability to tolerate cramps. She has controlled HTN, no diabetes, and ASCVD risk of <5%. If LDL is <190 will not start medications given low overall risk. If >190 may need to consider ezetemibe consider 2x treatment failure with statins. -lipid panel

## 2021-11-16 LAB — LIPID PANEL
Chol/HDL Ratio: 3.9 ratio (ref 0.0–4.4)
Cholesterol, Total: 213 mg/dL — ABNORMAL HIGH (ref 100–199)
HDL: 54 mg/dL (ref >39)
LDL Chol Calc (NIH): 140 mg/dL — ABNORMAL HIGH (ref 0–99)
Triglycerides: 104 mg/dL (ref 0–149)
VLDL Cholesterol Cal: 19 mg/dL (ref 5–40)

## 2021-11-16 LAB — BMP8+ANION GAP
Anion Gap: 15 mmol/L (ref 10.0–18.0)
BUN/Creatinine Ratio: 15 (ref 9–23)
BUN: 11 mg/dL (ref 6–24)
CO2: 24 mmol/L (ref 20–29)
Calcium: 9.4 mg/dL (ref 8.7–10.2)
Chloride: 104 mmol/L (ref 96–106)
Creatinine, Ser: 0.75 mg/dL (ref 0.57–1.00)
Glucose: 136 mg/dL — ABNORMAL HIGH (ref 70–99)
Potassium: 3.7 mmol/L (ref 3.5–5.2)
Sodium: 143 mmol/L (ref 134–144)
eGFR: 93 mL/min/1.73

## 2021-11-16 NOTE — Assessment & Plan Note (Signed)
Patient recorded 3 on PHQ9 for suicidal ideation/self harm. I called the patient and verified that she is not having any thoughts of harming herself or others, or ending her life ever and that she misread the paper and did not mean to circle 3.

## 2021-11-17 NOTE — Progress Notes (Signed)
Patient called and notified of normal renal function and electrolytes. LDL at 140 but given no ASCVD or T2DM will not treat at this time given inability to tolerate statins in the past. Discussed lifestyle modifications such as 150 minutes of moderate intensity exercise and some dietary changes( reducing fried foods, foods with high animal fat such as chicken skin or steak fat, substituting healthier oils like olive oil for butter).

## 2021-11-17 NOTE — Addendum Note (Signed)
Addended by: Aldine Contes on: 11/17/2021 11:13 AM   Modules accepted: Level of Service

## 2021-11-17 NOTE — Progress Notes (Signed)
Internal Medicine Clinic Attending  I saw and evaluated the patient.  I personally confirmed the key portions of the history and exam documented by Dr. Rogers and I reviewed pertinent patient test results.  The assessment, diagnosis, and plan were formulated together and I agree with the documentation in the resident's note.  

## 2021-11-23 ENCOUNTER — Encounter
Payer: Medicaid Other | Attending: Physical Medicine and Rehabilitation | Admitting: Physical Medicine and Rehabilitation

## 2021-11-23 VITALS — BP 153/80 | HR 77 | Ht 63.0 in | Wt 218.8 lb

## 2021-11-23 DIAGNOSIS — G894 Chronic pain syndrome: Secondary | ICD-10-CM | POA: Insufficient documentation

## 2021-11-23 DIAGNOSIS — Z79891 Long term (current) use of opiate analgesic: Secondary | ICD-10-CM | POA: Insufficient documentation

## 2021-11-23 DIAGNOSIS — L309 Dermatitis, unspecified: Secondary | ICD-10-CM | POA: Diagnosis not present

## 2021-11-23 DIAGNOSIS — Z5181 Encounter for therapeutic drug level monitoring: Secondary | ICD-10-CM | POA: Diagnosis not present

## 2021-11-23 DIAGNOSIS — I10 Essential (primary) hypertension: Secondary | ICD-10-CM | POA: Insufficient documentation

## 2021-11-23 DIAGNOSIS — M1711 Unilateral primary osteoarthritis, right knee: Secondary | ICD-10-CM | POA: Insufficient documentation

## 2021-11-23 MED ORDER — DICLOFENAC SODIUM 1 % EX GEL: 2.0000 g | Freq: Four times a day (QID) | CUTANEOUS | 3 refills | Status: DC

## 2021-11-23 MED ORDER — TRIAMCINOLONE ACETONIDE 0.1 % EX OINT: TOPICAL_OINTMENT | Freq: Two times a day (BID) | CUTANEOUS | 0 refills | Status: DC | PRN
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
## 2021-11-28 LAB — TOXASSURE SELECT,+ANTIDEPR,UR

## 2021-11-30 ENCOUNTER — Telehealth: Payer: Self-pay | Admitting: *Deleted

## 2021-11-30 NOTE — Telephone Encounter (Signed)
Urine drug screen for this encounter is consistent for prescribed medication 

## 2021-12-11 ENCOUNTER — Other Ambulatory Visit: Payer: Self-pay | Admitting: Internal Medicine

## 2021-12-12 ENCOUNTER — Other Ambulatory Visit: Payer: Self-pay | Admitting: *Deleted

## 2021-12-12 ENCOUNTER — Other Ambulatory Visit: Payer: Self-pay | Admitting: Internal Medicine

## 2021-12-12 DIAGNOSIS — J309 Allergic rhinitis, unspecified: Secondary | ICD-10-CM

## 2021-12-12 MED ORDER — PREGABALIN 100 MG PO CAPS: 100.0000 mg | ORAL_CAPSULE | Freq: Two times a day (BID) | ORAL | 0 refills | Status: DC

## 2021-12-12 NOTE — Telephone Encounter (Signed)
Call [facility] - Pt's insurance will not accept Dr Laurelyn Sickle DEA#; Lyrica was refilled today. I will The Attending to re-send rx. Thanks

## 2021-12-12 NOTE — Telephone Encounter (Signed)
Next appt scheduled 11/30 with Dr Lisabeth Devoid.

## 2021-12-14 ENCOUNTER — Ambulatory Visit: Payer: Medicaid Other | Admitting: Student

## 2021-12-14 ENCOUNTER — Telehealth: Payer: Self-pay | Admitting: *Deleted

## 2021-12-14 ENCOUNTER — Encounter: Payer: Self-pay | Admitting: Student

## 2021-12-14 ENCOUNTER — Other Ambulatory Visit: Payer: Self-pay | Admitting: Physical Medicine and Rehabilitation

## 2021-12-14 VITALS — BP 143/80 | HR 70 | Temp 98.3°F | Wt 222.7 lb

## 2021-12-14 DIAGNOSIS — Z87891 Personal history of nicotine dependence: Secondary | ICD-10-CM | POA: Diagnosis not present

## 2021-12-14 DIAGNOSIS — I10 Essential (primary) hypertension: Secondary | ICD-10-CM

## 2021-12-14 DIAGNOSIS — E041 Nontoxic single thyroid nodule: Secondary | ICD-10-CM

## 2021-12-14 DIAGNOSIS — M79651 Pain in right thigh: Secondary | ICD-10-CM | POA: Diagnosis not present

## 2021-12-14 MED ORDER — ALBUTEROL SULFATE HFA 108 (90 BASE) MCG/ACT IN AERS: 1.0000 | INHALATION_SPRAY | Freq: Every day | RESPIRATORY_TRACT | 2 refills | Status: DC | PRN

## 2021-12-14 MED ORDER — HYDROCHLOROTHIAZIDE 12.5 MG PO CAPS: 12.5000 mg | ORAL_CAPSULE | Freq: Every day | ORAL | 3 refills | Status: DC

## 2021-12-14 MED ORDER — OXYCODONE-ACETAMINOPHEN 5-325 MG PO TABS: 1.0000 | ORAL_TABLET | Freq: Two times a day (BID) | ORAL | 0 refills | Status: DC | PRN

## 2021-12-14 NOTE — Telephone Encounter (Signed)
Carrie Franco called for a refill on her oxycodone 5/325. Her last fill was 11/13/21.

## 2021-12-14 NOTE — Patient Instructions (Addendum)
It was a pleasure seeing you in clinic today.  Blood pressure Please start HCTZ 12.5 mg daily Keep a record of your BP at home and bring you log and cuff to you next visit  Thigh pain I do not think you have a blood clot, this may be referred pain from you back. Follow up with sport medicine if you would like to try a pain injection  We will refill your albuterol   We will arrange your thyroid US it is important to go at the scheduled time   Follow up in 1 month for BP follow up

## 2021-12-15 ENCOUNTER — Emergency Department (HOSPITAL_BASED_OUTPATIENT_CLINIC_OR_DEPARTMENT_OTHER)
Admit: 2021-12-15 | Discharge: 2021-12-15 | Disposition: A | Discharge status: Home / Self Care | Payer: Medicaid Other | Attending: Emergency Medicine | Admitting: Emergency Medicine

## 2021-12-15 ENCOUNTER — Emergency Department (HOSPITAL_COMMUNITY)
Admission: EM | Admit: 2021-12-15 | Discharge: 2021-12-15 | Disposition: A | Discharge status: Home / Self Care | Payer: Medicaid Other | Attending: Emergency Medicine | Admitting: Emergency Medicine

## 2021-12-15 ENCOUNTER — Emergency Department (HOSPITAL_COMMUNITY): Payer: Medicaid Other

## 2021-12-15 ENCOUNTER — Other Ambulatory Visit: Payer: Self-pay

## 2021-12-15 ENCOUNTER — Telehealth: Payer: Self-pay | Admitting: *Deleted

## 2021-12-15 DIAGNOSIS — M5416 Radiculopathy, lumbar region: Secondary | ICD-10-CM | POA: Insufficient documentation

## 2021-12-15 DIAGNOSIS — I10 Essential (primary) hypertension: Secondary | ICD-10-CM | POA: Diagnosis not present

## 2021-12-15 DIAGNOSIS — R52 Pain, unspecified: Secondary | ICD-10-CM | POA: Diagnosis not present

## 2021-12-15 DIAGNOSIS — Z79899 Other long term (current) drug therapy: Secondary | ICD-10-CM | POA: Insufficient documentation

## 2021-12-15 DIAGNOSIS — M549 Dorsalgia, unspecified: Secondary | ICD-10-CM | POA: Diagnosis present

## 2021-12-15 LAB — CBC WITH DIFFERENTIAL/PLATELET
Abs Immature Granulocytes: 0.01 10*3/uL (ref 0.00–0.07)
Basophils Absolute: 0 10*3/uL (ref 0.0–0.1)
Basophils Relative: 0 %
Eosinophils Absolute: 0.1 10*3/uL (ref 0.0–0.5)
Eosinophils Relative: 2 %
HCT: 37.2 % (ref 36.0–46.0)
Hemoglobin: 11.9 g/dL — ABNORMAL LOW (ref 12.0–15.0)
Immature Granulocytes: 0 %
Lymphocytes Relative: 50 %
Lymphs Abs: 3 10*3/uL (ref 0.7–4.0)
MCH: 29.5 pg (ref 26.0–34.0)
MCHC: 32 g/dL (ref 30.0–36.0)
MCV: 92.1 fL (ref 80.0–100.0)
Monocytes Absolute: 0.3 10*3/uL (ref 0.1–1.0)
Monocytes Relative: 5 %
Neutro Abs: 2.6 10*3/uL (ref 1.7–7.7)
Neutrophils Relative %: 43 %
Platelets: 82 10*3/uL — ABNORMAL LOW (ref 150–400)
RBC: 4.04 MIL/uL (ref 3.87–5.11)
RDW: 14.1 % (ref 11.5–15.5)
WBC: 6.1 10*3/uL (ref 4.0–10.5)
nRBC: 0 % (ref 0.0–0.2)

## 2021-12-15 LAB — BASIC METABOLIC PANEL
Anion gap: 13 (ref 5–15)
BUN: 8 mg/dL (ref 6–20)
CO2: 17 mmol/L — ABNORMAL LOW (ref 22–32)
Calcium: 9.1 mg/dL (ref 8.9–10.3)
Chloride: 109 mmol/L (ref 98–111)
Creatinine, Ser: 0.63 mg/dL (ref 0.44–1.00)
GFR, Estimated: >60 mL/min (ref >60)
Glucose, Bld: 89 mg/dL (ref 70–99)
Potassium: 4.1 mmol/L (ref 3.5–5.1)
Sodium: 139 mmol/L (ref 135–145)

## 2021-12-15 MED ORDER — METHYLPREDNISOLONE SODIUM SUCC 125 MG IJ SOLR
125.0000 mg | Freq: Once | INTRAMUSCULAR | Status: AC
  Administered 2021-12-15: 125 mg via INTRAVENOUS
  Filled 2021-12-15: qty 2

## 2021-12-15 MED ORDER — PREDNISONE 10 MG PO TABS: ORAL_TABLET | ORAL | 0 refills | Status: DC

## 2021-12-15 MED ORDER — CYCLOBENZAPRINE HCL 5 MG PO TABS: 5.0000 mg | ORAL_TABLET | Freq: Three times a day (TID) | ORAL | 0 refills | Status: DC | PRN

## 2021-12-15 MED ORDER — ONDANSETRON HCL 4 MG/2ML IJ SOLN
4.0000 mg | Freq: Once | INTRAMUSCULAR | Status: AC
  Administered 2021-12-15: 4 mg via INTRAVENOUS
  Filled 2021-12-15: qty 2

## 2021-12-15 MED ORDER — KETOROLAC TROMETHAMINE 30 MG/ML IJ SOLN
30.0000 mg | Freq: Once | INTRAMUSCULAR | Status: AC
  Administered 2021-12-15: 30 mg via INTRAVENOUS
  Filled 2021-12-15: qty 1

## 2021-12-15 MED ORDER — CYCLOBENZAPRINE HCL 10 MG PO TABS
5.0000 mg | ORAL_TABLET | Freq: Once | ORAL | Status: AC
  Administered 2021-12-15: 5 mg via ORAL
  Filled 2021-12-15: qty 1

## 2021-12-15 MED ORDER — MORPHINE SULFATE (PF) 4 MG/ML IV SOLN
4.0000 mg | Freq: Once | INTRAVENOUS | Status: AC
  Administered 2021-12-15: 4 mg via INTRAVENOUS
  Filled 2021-12-15: qty 1

## 2021-12-15 NOTE — ED Notes (Signed)
Green top and lavender top sent to the lab at this time via tube station.

## 2021-12-15 NOTE — Discharge Instructions (Addendum)
Take prednisone as prescribed  Take Flexeril for muscle spasms.  Please take oxycodone as prescribed by your doctor.  See your orthopedic doctor for follow-up.  I think you likely have a pinched nerve in your back that is affecting your leg  Return to ER if you have worse back pain or groin pain or trouble walking or weakness or numbness.

## 2021-12-15 NOTE — ED Triage Notes (Addendum)
Here from home for bilateral leg and groin pain, R leg onset 3 weeks ago, started intermittent initially, but now is constant. L onset yesterday, and is constant. Mentions some lower back pain but denies radiation or buttocks pain. Denies injury, numbness, tingling, fever, incontinence, abd pain, urinary sx. Difficulty walking, decreased ROM d/t pain. Pinpoints pain to inner thighs BLE and into groin.

## 2021-12-15 NOTE — Telephone Encounter (Signed)
Patient called in stating she discussed right groin pain that extends to right inner thigh with Provider at [facility] yesterday. That pain has been going on for 2 weeks. Last evening she developed same pain in left groin extending to left inner thigh. States pain is so severe she can barely walk. She is advised to present to ED as she may need imaging. She agrees to go.

## 2021-12-15 NOTE — ED Notes (Signed)
All discharge instructions reviewed with patient including follow up care and prescriptions.Patient verbalized understanding of same and had no other questions. Patient stable at time of discharge and wheel chair was provided to patient prior to leaving department.

## 2021-12-15 NOTE — ED Notes (Signed)
Pt ambulatory in lobby.

## 2021-12-15 NOTE — ED Provider Notes (Signed)
[name] EMERGENCY DEPARTMENT Provider Note   CSN: [id number] Arrival date & time: 12/15/21  1222     History  Chief Complaint  Patient presents with   Leg Pain    [place] is a 56 y.o. female history of lumbar radiculopathy on chronic pain medicine.  Hypertension, here presenting with back pain and groin pain.  Patient has bilateral groin pain for the last several days.  She went to her doctor and was complaining the right-sided groin pain and leg pain.  She states that today it is on the left side.  She states that she has a hard time bearing weight on the leg.  She states that it feels like pins-and-needles to both of her legs.  Patient is on chronic oxycodone and did take 2 doses of oxycodone today but did not help with the pain.  Patient states that she has a hard time walking due to the pain.  Denies any dysuria or trouble urinating.  Patient follows up with Dr. Marlou Sa for her back.  The history is provided by the patient.       Home Medications Prior to Admission medications   Medication Sig Start Date End Date Taking? Authorizing Provider  albuterol (VENTOLIN HFA) 108 (90 Base) MCG/ACT inhaler Inhale 1 puff into the lungs daily as needed. 12/14/21   Iona Beard, MD  amLODipine (NORVASC) 10 MG tablet Take 1 tablet (10 mg total) by mouth daily. 03/08/21   Demaio, Alexa, MD  carbamazepine (TEGRETOL) 200 MG tablet Take 1 tablet (200 mg total) by mouth 2 (two) times daily. Take 1 tablet at night time for 1 week then take it twice daily. 09/27/20 11/26/20  Idamae Schuller, MD  cetirizine (ZYRTEC) 10 MG tablet Take 1 tablet by mouth once daily 12/12/21   Idamae Schuller, MD  cyclobenzaprine (FLEXERIL) 5 MG tablet Take 1 tablet (5 mg total) by mouth 2 (two) times daily. 06/08/21   Demaio, Alexa, MD  diclofenac Sodium (VOLTAREN) 1 % GEL Apply 2 g topically 4 (four) times daily. 11/23/21   Raulkar, Clide Deutscher, MD  FLUoxetine (PROZAC) 20 MG capsule fluoxetine 20 mg  capsule   40 mg by oral route. 04/16/18   [provider]  FLUoxetine (PROZAC) 40 MG capsule Take 1 capsule by mouth once daily 12/06/20   Sanjuan Dame, MD  hydrochlorothiazide (MICROZIDE) 12.5 MG capsule Take 1 capsule (12.5 mg total) by mouth daily. 12/14/21 12/09/22  Iona Beard, MD  ibuprofen (ADVIL) 800 MG tablet Take 1 tablet (800 mg total) by mouth every 6 (six) hours as needed for fever or moderate pain. 03/28/21   Idamae Schuller, MD  Lido-Capsaicin-Men-Methyl Sal (1ST MEDX-PATCH/ LIDOCAINE) 4-0.025-5-20 % PTCH Apply 1 patch topically daily. 09/27/20   Idamae Schuller, MD  lidocaine 4 % lidocaine 4 % topical patch    07/29/19   [provider]  olmesartan (BENICAR) 40 MG tablet Take 1 tablet (40 mg total) by mouth daily. 03/08/21 06/06/21  Demaio, Roxine Caddy, MD  oxyCODONE-acetaminophen (PERCOCET/ROXICET) 5-325 MG tablet Take 1-2 tablets by mouth 2 (two) times daily as needed for moderate pain. No More than 4 tablets a day. 12/14/21   Raulkar, Clide Deutscher, MD  pantoprazole (PROTONIX) 40 MG tablet Take 1 tablet (40 mg total) by mouth daily. 06/28/21 06/28/22  Idamae Schuller, MD  phentermine 37.5 MG capsule Take 37.5 mg by mouth every morning.    [provider]  Polyethylene Glycol 3350 (MIRALAX PO) Take 238 g by mouth once. Colonoscopy  prep    [provider]  pregabalin (LYRICA) 100 MG capsule Take 1 capsule (100 mg total) by mouth 2 (two) times daily. 12/12/21   Charise Killian, MD  triamcinolone ointment (KENALOG) 0.1 % Apply topically 2 (two) times daily as needed. 11/23/21   Raulkar, Clide Deutscher, MD  valACYclovir (VALTREX) 500 MG tablet Take 1 tablet (500 mg total) by mouth daily. 06/08/21   [name] Presto, MD      Allergies    Hydrocodone, Orange fruit [citrus], Orange oil, Other, Penicillin g, Clindamycin/lincomycin, Meloxicam, and Penicillins    Review of Systems   Review of Systems  Musculoskeletal:  Positive for back pain.  All other systems reviewed and are  negative.   Physical Exam Updated Vital Signs BP (!) 147/93   Pulse 72   Temp 98.5 F (36.9 C)   Resp 18   Ht '5\' 3"'$  (1.6 m)   Wt 99.8 kg   LMP 11/22/2012   SpO2 95%   BMI 38.97 kg/m  Physical Exam Vitals and nursing note reviewed.  Constitutional:      Appearance: Normal appearance.     Comments: Uncomfortable   HENT:     Head: Normocephalic.     Nose: Nose normal.     Mouth/Throat:     Mouth: Mucous membranes are moist.  Eyes:     Extraocular Movements: Extraocular movements intact.     Pupils: Pupils are equal, round, and reactive to light.  Cardiovascular:     Rate and Rhythm: Normal rate and regular rhythm.     Pulses: Normal pulses.     Heart sounds: Normal heart sounds.  Pulmonary:     Effort: Pulmonary effort is normal.     Breath sounds: Normal breath sounds.  Abdominal:     General: Abdomen is flat.     Palpations: Abdomen is soft.  Musculoskeletal:     Cervical back: Normal range of motion and neck supple.     Comments: Mild lower lumbar tenderness.  Patient has positive straight leg raise bilateral legs.  Patient has no saddle anesthesia.  Patient has normal reflexes bilateral knees.  Patient has bilateral thigh tenderness  Skin:    General: Skin is warm.     Capillary Refill: Capillary refill takes less than 2 seconds.  Neurological:     General: No focal deficit present.     Mental Status: She is alert and oriented to person, place, and time.     Comments: Patient has no saddle anesthesia.  Normal reflexes bilateral knees.  Positive straight leg raise bilaterally  Psychiatric:        Mood and Affect: Mood normal.        Behavior: Behavior normal.     ED Results / Procedures / Treatments   Labs (all labs ordered are listed, but only abnormal results are displayed) Labs Reviewed  CBC WITH DIFFERENTIAL/PLATELET - Abnormal; Notable for the following components:      Result Value   Hemoglobin 11.9 (*)    Platelets 82 (*)    All other components  within normal limits  BASIC METABOLIC PANEL - Abnormal; Notable for the following components:   CO2 17 (*)    All other components within normal limits    EKG None  Radiology VAS [place] LOWER EXTREMITY VENOUS (DVT) (ONLY [facility])  Result Date: 12/15/2021  Lower Venous DVT Study Patient Name:  Carrie Franco  Date of Exam:   12/15/2021 Medical Rec #: [id number]  Accession #:    [id number] Date of Birth: 10/09/1965      Patient Gender: F Patient Age:   37 years Exam Location:  [facility] Procedure:      VAS [place] LOWER EXTREMITY VENOUS (DVT) Referring Phys: Markeis Allman --------------------------------------------------------------------------------  Indications: Pain.  Risk Factors: None identified. Limitations: Body habitus and poor ultrasound/tissue interface. Comparison Study: No prior studies. Performing Technologist: Oliver Hum RVT  Examination Guidelines: A complete evaluation includes B-mode imaging, spectral Doppler, color Doppler, and power Doppler as needed of all accessible portions of each vessel. Bilateral testing is considered an integral part of a complete examination. Limited examinations for reoccurring indications may be performed as noted. The reflux portion of the exam is performed with the patient in reverse Trendelenburg.  +---------+---------------+---------+-----------+----------+--------------+ RIGHT    CompressibilityPhasicitySpontaneityPropertiesThrombus Aging +---------+---------------+---------+-----------+----------+--------------+ CFV      Full           Yes      Yes                                 +---------+---------------+---------+-----------+----------+--------------+ SFJ      Full                                                        +---------+---------------+---------+-----------+----------+--------------+ FV Prox  Full                                                         +---------+---------------+---------+-----------+----------+--------------+ FV Mid   Full                                                        +---------+---------------+---------+-----------+----------+--------------+ FV DistalFull                                                        +---------+---------------+---------+-----------+----------+--------------+ PFV      Full                                                        +---------+---------------+---------+-----------+----------+--------------+ POP      Full           Yes      Yes                                 +---------+---------------+---------+-----------+----------+--------------+ PTV      Full                                                        +---------+---------------+---------+-----------+----------+--------------+  PERO     Full                                                        +---------+---------------+---------+-----------+----------+--------------+   +---------+---------------+---------+-----------+----------+--------------+ LEFT     CompressibilityPhasicitySpontaneityPropertiesThrombus Aging +---------+---------------+---------+-----------+----------+--------------+ CFV      Full           Yes      Yes                                 +---------+---------------+---------+-----------+----------+--------------+ SFJ      Full                                                        +---------+---------------+---------+-----------+----------+--------------+ FV Prox  Full                                                        +---------+---------------+---------+-----------+----------+--------------+ FV Mid   Full                                                        +---------+---------------+---------+-----------+----------+--------------+ FV DistalFull                                                         +---------+---------------+---------+-----------+----------+--------------+ PFV      Full                                                        +---------+---------------+---------+-----------+----------+--------------+ POP      Full           Yes      Yes                                 +---------+---------------+---------+-----------+----------+--------------+ PTV      Full                                                        +---------+---------------+---------+-----------+----------+--------------+ PERO     Full                                                        +---------+---------------+---------+-----------+----------+--------------+  Summary: RIGHT: - There is no evidence of deep vein thrombosis in the lower extremity.  - No cystic structure found in the popliteal fossa.  LEFT: - There is no evidence of deep vein thrombosis in the lower extremity.  - No cystic structure found in the popliteal fossa.  *See table(s) above for measurements and observations.    Preliminary    CT Lumbar Spine Wo Contrast  Result Date: 12/15/2021 CLINICAL DATA:  Low back and bilateral leg and groin pain. Right leg onset 3 weeks ago. Left sided onset yesterday. No acute injury. EXAM: CT LUMBAR SPINE WITHOUT CONTRAST TECHNIQUE: Multidetector CT imaging of the lumbar spine was performed without intravenous contrast administration. Multiplanar CT image reconstructions were also generated. RADIATION DOSE REDUCTION: This exam was performed according to the departmental dose-optimization program which includes automated exposure control, adjustment of the mA and/or kV according to patient size and/or use of iterative reconstruction technique. COMPARISON:  Lumbar spine radiographs 09/15/2021. Lumbar MRI 10/04/2021. FINDINGS: Segmentation: There are 5 lumbar type vertebral bodies. Alignment: Stable minimal degenerative anterolisthesis at L4-5. Vertebrae: No evidence of acute fracture or pars defect.  Paraspinal and other soft tissues: No acute paraspinal findings. Aortic and branch vessel atherosclerosis with focal dilatation of the infrarenal abdominal aorta having a maximal transverse diameter of 2.4 cm. No focal aneurysm. Disc levels: Lumbar spine degenerative changes are similar to recent MRI. At L4-5, there is loss of disc height with annular disc bulging eccentric to the right and advanced facet and ligamentous hypertrophy. Mild right foraminal narrowing without nerve root encroachment or central canal stenosis. At L5-S1, there is mild disc bulging and bilateral facet hypertrophy, but no spinal stenosis or nerve root encroachment. IMPRESSION: 1. No acute findings or clear explanation for the patient's symptoms. 2. Lumbar spine degenerative changes are similar to recent MRI. Advanced facet hypertrophy at L4-5 with associated grade 1 anterolisthesis and mild right foraminal narrowing 3.  Aortic Atherosclerosis (ICD10-I70.0). Electronically Signed   By: Richardean Sale M.D.   On: 12/15/2021 18:16    Procedures Procedures    Medications Ordered in ED Medications  cyclobenzaprine (FLEXERIL) tablet 5 mg (has no administration in time range)  methylPREDNISolone sodium succinate (SOLU-MEDROL) 125 mg/2 mL injection 125 mg (has no administration in time range)  morphine (PF) 4 MG/ML injection 4 mg (4 mg Intravenous Given 12/15/21 1841)  ondansetron (ZOFRAN) injection 4 mg (4 mg Intravenous Given 12/15/21 1840)  ketorolac (TORADOL) 30 MG/ML injection 30 mg (30 mg Intravenous Given 12/15/21 1843)    ED Course/ Medical Decision Making/ A&P                           Medical Decision Making Eliabeth SONNIE BIAS is a 56 y.o. female here presenting with bilateral leg pain.  Patient concern for DVT but I think clinically patient has radiculopathy.  Will get bilateral DVT study.  Also will get CT of the lumbar spine.  Will give pain medicine   7:49 PM I reviewed patient's labs and independently interpreted  imaging studies.  Ultrasound showed no DVT.  CT lumbar spine showed known degenerative changes at L4 and 5.  I think this is causing radiculopathy symptoms.  Clinically, patient has sciatica.  Patient was given pain medicine and steroids and Flexeril and felt better.  She is already prescribed oxycodone I told her that I cannot prescribe more pain medicine.  Will prescribe Flexeril and a course of steroids.  Problems Addressed: Lumbar radiculopathy:  acute illness or injury  Amount and/or Complexity of Data Reviewed Labs: ordered. Decision-making details documented in ED Course. Radiology: ordered and independent interpretation performed. Decision-making details documented in ED Course.  Risk Prescription drug management.    Final Clinical Impression(s) / ED Diagnoses Final diagnoses:  None    Rx / DC Orders ED Discharge Orders     None         Drenda Freeze, MD 12/15/21 [phone]

## 2021-12-15 NOTE — Telephone Encounter (Signed)
Thanks for the upate

## 2021-12-15 NOTE — Progress Notes (Signed)
Bilateral lower extremity venous duplex has been completed. Preliminary results can be found in CV Proc through chart review.  Results were given to Dr. Darl Householder.  12/15/21 6:27 PM Carrie Franco RVT

## 2021-12-16 ENCOUNTER — Telehealth: Payer: Self-pay

## 2021-12-16 NOTE — Telephone Encounter (Signed)
[employer] called in to state that the quantity on prednisone based on instructions on taper should be 24 not 12. 10 mg tabs ordered, approved change.

## 2021-12-19 ENCOUNTER — Telehealth: Payer: Self-pay | Admitting: *Deleted

## 2021-12-19 NOTE — Patient Outreach (Signed)
  Care Coordination TOC Note Transition Care Management Follow-up Telephone Call Date of discharge and from where: 12/15/21 from [facility] How have you been since you were released from the hospital? Patient reports falling on Sunday and bruising her arm. Denies pain today. Any questions or concerns? No  Items Reviewed: Did the pt receive and understand the discharge instructions provided? Yes  Medications obtained and verified? No Patient's daughter will pick up medications today. Other? No Patient would like grab bars in her bathroom. RNCM advised to contact [employer] to request grab bars. Any new allergies since your discharge? No  Dietary orders reviewed? Yes Do you have support at home? Yes   Home Care and Equipment/Supplies: Were home health services ordered? no If so, what is the name of the agency? N/A  Has the agency set up a time to come to the patient's home? not applicable Were any new equipment or medical supplies ordered?  No What is the name of the medical supply agency? N/A Were you able to get the supplies/equipment? not applicable Do you have any questions related to the use of the equipment or supplies? No  Functional Questionnaire: (I = Independent and D = Dependent) ADLs: I  Bathing/Dressing- I  Meal Prep- I  Eating- I  Maintaining continence- I  Transferring/Ambulation- I  Managing Meds- I  Follow up appointments reviewed:  PCP Hospital f/u appt confirmed?  N/A-ED visit  Advised patient to follow up with PCP and Dr. [facility] f/u appt confirmed? Yes  Scheduled to see Pain Management on 01/10/22 @ 8:40am. Are transportation arrangements needed?  Patient has transportation , but is not always able to use her transportation. [employer] transportation contact provided to patient  [phone] If their condition worsens, is the pt aware to call PCP or go to the Emergency Dept.? Yes Was the patient provided with contact information for the  PCP's office or ED? No Was to pt encouraged to call back with questions or concerns? Yes  RNCM provided patient with information about CM services. Patient declines today, will discuss with PCP and request referral if needed.  Lurena Joiner RN, BSN [place]  [employer] manager

## 2021-12-20 MED ORDER — OLMESARTAN MEDOXOMIL 40 MG PO TABS: 40.0000 mg | ORAL_TABLET | Freq: Every day | ORAL | 3 refills | Status: DC

## 2021-12-20 NOTE — Assessment & Plan Note (Signed)
Missed her [place] study for this. Will place new order for this. Emphasized the importance of getting this done with patient.

## 2021-12-20 NOTE — Assessment & Plan Note (Addendum)
BP elevated 149/78 and 143/80 on repeat. Is taking amlodpine 10 mg daily and olmesartan 40 mg. Will start her on hctz 12.5 mg daily. Follow up in 2 weeks.

## 2021-12-20 NOTE — Assessment & Plan Note (Addendum)
Complains of dull aching in the right medial thigh for the past 2 weeks. Started after she twisted her leg after twisting her leg and standing up from the chair. No swelling, redness, weakness in legs. Not having pain today. Is using her percocet which she takes for lumbosacral radiculopathy with some improvement.   She is concerned about DVT. Did drive to [place] a several weeks ago but frequently got up to strech. No other recent surgery or trauma. No history of VTE. Not a typical location of have pain from DVT. Provided reassurance and return precautions of DVT.   Suspect pain is referred from her lumbar radiculopathy. Had imaging of in September with L4-L5 facet disease. Had a steroid injection with PM&R doctors with improvement. She will discuss continuing this if pain remains bothersome.

## 2021-12-20 NOTE — Progress Notes (Signed)
Established Patient Office Visit  Subjective   Patient ID: Carrie Franco, female    DOB: 02-Jan-1966  Age: 56 y.o. MRN: [id number]  Chief Complaint  Patient presents with   thigh pain   Medication Refill    Carrie Franco is a 56 y.o. person living with a history listed below who presents to clinic for right thigh pain. Please refer to problem based charting for further details and assessment and plan of current problem and chronic medical conditions.     Patient Active Problem List   Diagnosis Date Noted   Epistaxis 11/15/2021   Thyroid nodule 12/01/2020   Headache 10/02/2020   Cervical radiculopathy 08/12/2020   Aortic atherosclerosis ([place]) 02/17/2020   Emphysema of lung ([place]) 02/17/2020   Neuropathic pain 11/26/2019   Strain of thoracic paraspinal muscles excluding T1 and T2 levels 04/18/2018   Onychomycosis 12/04/2017   Radiculopathy of lumbosacral region 06/24/2017   Depression 01/02/2017   Osteoarthritis 12/05/2015   Trigeminal neuralgia of right side of face 11/09/2015   Eczema 04/01/2015   Chronic neck pain 03/08/2015   History of colonic polyps 09/07/2014   Normocytic anemia 09/07/2014   Hyperlipidemia 07/31/2013   GERD (gastroesophageal reflux disease) 03/19/2013   Cerumen impaction 08/07/2012   Obesity (BMI 30.0-34.9) 08/05/2012   Right thigh pain 07/17/2012   Vaginal discharge 06/22/2011   Persistent asthma 02/23/2010   Healthcare maintenance 02/23/2010   Essential hypertension 01/13/2007   History of herpes genitalis 01/02/2006   Allergic sinusitis 01/02/2006      ROS    Objective:     BP (!) 143/80 (BP Location: Left Arm, Cuff Size: Large)   Pulse 70   Temp 98.3 F (36.8 C) (Oral)   Wt 222 lb 11.2 oz (101 kg)   LMP 11/22/2012   SpO2 100%   BMI 39.45 kg/m  BP Readings from Last 3 Encounters:  12/15/21 (!) 151/82  12/14/21 (!) 143/80  11/23/21 (!) 153/80      Physical Exam Constitutional:      Appearance: Normal appearance.  HENT:      Mouth/Throat:     Mouth: Mucous membranes are moist.     Pharynx: Oropharynx is clear.  Cardiovascular:     Rate and Rhythm: Normal rate and regular rhythm.  Pulmonary:     Effort: Pulmonary effort is normal.     Breath sounds: No rhonchi or rales.  Abdominal:     General: Abdomen is flat. Bowel sounds are normal. There is no distension.     Palpations: Abdomen is soft.     Tenderness: There is no abdominal tenderness.  Musculoskeletal:        General: Normal range of motion.     Right lower leg: No edema.     Left lower leg: No edema.     Comments: Mild TPP of the right medial thigh, no redness, no swelling in the thigh or lower extremity, TTP across the lumbar back  Skin:    General: Skin is warm and dry.     Capillary Refill: Capillary refill takes less than 2 seconds.  Neurological:     General: No focal deficit present.     Mental Status: Carrie Franco is alert and oriented to person, place, and time.  Psychiatric:        Mood and Affect: Mood normal.        Behavior: Behavior normal.      No results found for any visits on 12/14/21.     The  10-year ASCVD risk score (Arnett DK, et al., 2019) is: 10.2%    Assessment & Plan:   Problem List Items Addressed This Visit       Cardiovascular and Mediastinum   Essential hypertension (Chronic)    BP elevated 149/78 and 143/80 on repeat. Is taking amlodpine 10 mg daily and olmesartan 40 mg. Will start her on hctz 12.5 mg daily. Follow up in 2 weeks.       Relevant Medications   hydrochlorothiazide (MICROZIDE) 12.5 MG capsule   olmesartan (BENICAR) 40 MG tablet     Endocrine   Thyroid nodule - Primary    Missed her [place] study for this. Will place new order for this. Emphasized the importance of getting this done with patient.       Relevant Orders   US THYROID     Other   Right thigh pain    Complains of dull aching in the right medial thigh for the past 2 weeks. Started after Carrie Franco twisted her leg after twisting her leg  and standing up from the chair. No swelling, redness, weakness in legs. Not having pain today. Is using her percocet which Carrie Franco takes for lumbosacral radiculopathy with some improvement.   Carrie Franco is concerned about DVT. Did drive to [place] a several weeks ago but frequently got up to strech. No other recent surgery or trauma. No history of VTE. Not a typical location of have pain from DVT. Provided reassurance and return precautions of DVT.   Suspect pain is referred from her lumbar radiculopathy. Had imaging of in September with L4-L5 facet disease. Had a steroid injection with PM&R doctors with improvement. Carrie Franco will discuss continuing this if pain remains bothersome.          Return in about 1 month (around 01/13/2022).    Iona Beard, MD

## 2021-12-21 NOTE — Addendum Note (Signed)
Addended by: Jodean Lima on: 12/21/2021 10:53 AM   Modules accepted: Level of Service

## 2021-12-21 NOTE — Progress Notes (Signed)
Internal Medicine Clinic Attending ? ?Case discussed with Dr. Liang  At the time of the visit.  We reviewed the resident?s history and exam and pertinent patient test results.  I agree with the assessment, diagnosis, and plan of care documented in the resident?s note. ? ?

## 2021-12-29 ENCOUNTER — Other Ambulatory Visit: Payer: Self-pay | Admitting: Student

## 2021-12-29 DIAGNOSIS — F331 Major depressive disorder, recurrent, moderate: Secondary | ICD-10-CM

## 2022-01-01 ENCOUNTER — Ambulatory Visit: Payer: Medicaid Other | Admitting: Surgical

## 2022-01-01 DIAGNOSIS — M1711 Unilateral primary osteoarthritis, right knee: Secondary | ICD-10-CM | POA: Diagnosis not present

## 2022-01-10 ENCOUNTER — Encounter: Payer: Medicaid Other | Attending: Physical Medicine and Rehabilitation | Admitting: Registered Nurse

## 2022-01-10 ENCOUNTER — Encounter: Payer: Self-pay | Admitting: Surgical

## 2022-01-10 ENCOUNTER — Other Ambulatory Visit: Payer: Self-pay | Admitting: Internal Medicine

## 2022-01-10 DIAGNOSIS — Z79891 Long term (current) use of opiate analgesic: Secondary | ICD-10-CM | POA: Insufficient documentation

## 2022-01-10 DIAGNOSIS — M1711 Unilateral primary osteoarthritis, right knee: Secondary | ICD-10-CM | POA: Insufficient documentation

## 2022-01-10 DIAGNOSIS — Z5181 Encounter for therapeutic drug level monitoring: Secondary | ICD-10-CM | POA: Insufficient documentation

## 2022-01-10 DIAGNOSIS — I10 Essential (primary) hypertension: Secondary | ICD-10-CM | POA: Insufficient documentation

## 2022-01-10 DIAGNOSIS — L309 Dermatitis, unspecified: Secondary | ICD-10-CM | POA: Insufficient documentation

## 2022-01-10 DIAGNOSIS — G894 Chronic pain syndrome: Secondary | ICD-10-CM | POA: Insufficient documentation

## 2022-01-10 NOTE — Telephone Encounter (Signed)
Last visit with [facility] 11/15/2021 Last dispensed  12/12/2021 #60  Also followed by [facility] (appt today)
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
## 2022-01-11 ENCOUNTER — Other Ambulatory Visit: Payer: Self-pay

## 2022-01-11 NOTE — Telephone Encounter (Signed)
Incoming fax from pharmacy: pregablin "Dr. Is not covered for Mrs.Mohiuddin in service"

## 2022-01-11 NOTE — Telephone Encounter (Signed)
Refill request for lyrica for her nerve pain. Medication list with Neurontin also listed. Call patient and she is no longer taking. Also has multiple muscle relaxer's listed. Will need medication reconciliation at next clinic visit.

## 2022-01-16 ENCOUNTER — Encounter: Payer: Self-pay | Admitting: Registered Nurse

## 2022-01-16 ENCOUNTER — Encounter: Payer: Medicaid Other | Attending: Physical Medicine and Rehabilitation | Admitting: Registered Nurse

## 2022-01-16 ENCOUNTER — Telehealth: Payer: Self-pay

## 2022-01-16 VITALS — BP 136/86 | HR 81 | Ht 63.0 in | Wt 223.0 lb

## 2022-01-16 DIAGNOSIS — M545 Low back pain, unspecified: Secondary | ICD-10-CM | POA: Diagnosis not present

## 2022-01-16 DIAGNOSIS — G894 Chronic pain syndrome: Secondary | ICD-10-CM

## 2022-01-16 DIAGNOSIS — Z79891 Long term (current) use of opiate analgesic: Secondary | ICD-10-CM | POA: Diagnosis not present

## 2022-01-16 DIAGNOSIS — M170 Bilateral primary osteoarthritis of knee: Secondary | ICD-10-CM

## 2022-01-16 DIAGNOSIS — Z5181 Encounter for therapeutic drug level monitoring: Secondary | ICD-10-CM

## 2022-01-16 DIAGNOSIS — M2550 Pain in unspecified joint: Secondary | ICD-10-CM | POA: Diagnosis not present

## 2022-01-16 DIAGNOSIS — G8929 Other chronic pain: Secondary | ICD-10-CM | POA: Insufficient documentation

## 2022-01-16 DIAGNOSIS — M7918 Myalgia, other site: Secondary | ICD-10-CM | POA: Diagnosis present

## 2022-01-16 MED ORDER — OXYCODONE-ACETAMINOPHEN 5-325 MG PO TABS: 1.0000 | ORAL_TABLET | Freq: Two times a day (BID) | ORAL | 0 refills | Status: DC | PRN

## 2022-01-16 MED ORDER — PREGABALIN 100 MG PO CAPS: 100.0000 mg | ORAL_CAPSULE | Freq: Two times a day (BID) | ORAL | 1 refills | Status: DC
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
## 2022-01-16 NOTE — Telephone Encounter (Signed)
Lyrica rx was last written 01/11/22.I called [facility] who stated Dr Johnney Ou is not licensed with Medicaid. I will send to [facility] to resend rx.

## 2022-01-16 NOTE — Telephone Encounter (Signed)
Patient called she stated she is unable to get her rx for lyrica- Dr. Does not cover in service

## 2022-01-21 DIAGNOSIS — H5213 Myopia, bilateral: Secondary | ICD-10-CM | POA: Diagnosis not present

## 2022-01-22 ENCOUNTER — Encounter: Payer: Medicaid Other | Admitting: Internal Medicine

## 2022-01-23 ENCOUNTER — Encounter: Payer: Medicaid Other | Admitting: Internal Medicine

## 2022-02-01 ENCOUNTER — Other Ambulatory Visit: Payer: Self-pay

## 2022-02-01 ENCOUNTER — Encounter: Payer: Self-pay | Admitting: Student

## 2022-02-01 ENCOUNTER — Encounter: Payer: Medicaid Other | Admitting: Physical Medicine and Rehabilitation

## 2022-02-01 ENCOUNTER — Ambulatory Visit: Payer: Medicaid Other | Admitting: Student

## 2022-02-01 VITALS — BP 136/80 | HR 70 | Temp 97.7°F | Ht 63.0 in | Wt 223.6 lb

## 2022-02-01 DIAGNOSIS — I10 Essential (primary) hypertension: Secondary | ICD-10-CM

## 2022-02-01 DIAGNOSIS — E041 Nontoxic single thyroid nodule: Secondary | ICD-10-CM | POA: Diagnosis not present

## 2022-02-01 DIAGNOSIS — D696 Thrombocytopenia, unspecified: Secondary | ICD-10-CM | POA: Insufficient documentation

## 2022-02-01 DIAGNOSIS — Z01818 Encounter for other preprocedural examination: Secondary | ICD-10-CM | POA: Diagnosis present

## 2022-02-01 DIAGNOSIS — Z87891 Personal history of nicotine dependence: Secondary | ICD-10-CM

## 2022-02-01 DIAGNOSIS — K219 Gastro-esophageal reflux disease without esophagitis: Secondary | ICD-10-CM | POA: Diagnosis not present

## 2022-02-01 MED ORDER — OLMESARTAN MEDOXOMIL 40 MG PO TABS: 40.0000 mg | ORAL_TABLET | Freq: Every day | ORAL | 3 refills | Status: DC

## 2022-02-01 MED ORDER — PANTOPRAZOLE SODIUM 40 MG PO TBEC: 40.0000 mg | DELAYED_RELEASE_TABLET | Freq: Every day | ORAL | 3 refills | Status: DC

## 2022-02-01 MED ORDER — HYDROCHLOROTHIAZIDE 12.5 MG PO CAPS: 12.5000 mg | ORAL_CAPSULE | Freq: Every day | ORAL | 3 refills | Status: DC

## 2022-02-01 MED ORDER — AMLODIPINE BESYLATE 10 MG PO TABS: 10.0000 mg | ORAL_TABLET | Freq: Every day | ORAL | 3 refills | Status: DC

## 2022-02-01 NOTE — Assessment & Plan Note (Signed)
METs3 - patient is limited by knee pain, but denies anginal symptoms or dyspnea on exertion. Revised Cardiac Risk Index- Class I risk <2 for cardiac complication. Patient can proceed to surgery without further cardiac work up. Patient should be cleared for surgery from chronic conditions stand point.  Patient's medications can be stopped on day of surgery.   However, limiting factor at this time is recent thrombocytopenia on 12/2021 at ED visit after a fall 2/2 R knee pain/"giving out". Platelet 82 from 257 (2 years prior).   Will repeat CBC today to see if improvement. If platelet counts remain above 80, patient can be cleared for spinal anesthesia for R total knee replacement. If counts have not improved, discussed with patient need for return to clinic for further work up.
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
## 2022-02-01 NOTE — Assessment & Plan Note (Signed)
Needs ultrasound since 2022 for nodule. Patient denies hypo/hyperthyroid symptoms today. Last TSH 1.1 in 2019. -Repeat TSH today -F/u thryroid ultrasound orders

## 2022-02-01 NOTE — Assessment & Plan Note (Signed)
Platelets 82 on 12/15/2021, last platelet count >200 in 2022. Patient denies history of spontanous bruising.  No recent history of viral illness, EtOh use. No prior hx of cirrhosis. On her medication list, patient is taking HCTZ started on 11/30 and naproxen, both of which can be associated with thrombocytopenia. The differential is extensive. Will repeat CBC today. If no improvement, will have patient return for a blood smear.   Regarding surgery, per UpToDate, no platelet transfusion is required for major surgery unless or lumbar punctures unless <50K.  To limit the risk of spinal hematoma after spinal anesthesia, brief literature recommends platelet counts >80K. Will include this into consideration for surgical clearance upon reviewing CBC

## 2022-02-01 NOTE — Assessment & Plan Note (Signed)
Managed on home readings.Denies chest pain, shortness of breath, changes in vision or palpitations.  Compliant with medications.  Vitals:   02/01/22 0942  BP: 136/80  Last BMP 12/2021 Cr. 63 Continue current therapy - Amlodipine 10 mg - Olmesartan  - HCTZ 12.5 mg daily

## 2022-02-01 NOTE — Patient Instructions (Signed)
Thank you, Ms.Deyra Domingo Pulse for allowing [place] to provide your care today. Today we discussed your upcoming surgery  We think your medical conditions are stable and you are okay to undergo your surgery.   However, the last platelet count was low and we need to repeat this lab to make sure your platelets have increased since then. We will do a CBC test. If the platelets have not come up above 100, we will need to bring you back for more testing.'  We will follow up on the scheduling for your thyroid nodule.  I have ordered the following labs for you:  Lab Orders         CBC no Diff       I will call if any are abnormal. All of your labs can be accessed through "My Chart".    Please make sure to arrive 15 minutes prior to your next appointment. If you arrive late, you may be asked to reschedule.    We look forward to seeing you next time. Please call our clinic at [phone] if you have any questions or concerns. The best time to call is Monday-Friday from 9am-4pm, but there is someone available 24/7. If after hours or the weekend, call the main hospital number and ask for the Internal Medicine Resident On-Call. If you need medication refills, please notify your pharmacy one week in advance and they will send [place] a request.   Thank you for letting us take part in your care. Wishing you the best!  [name], MD 02/01/2022, 10:28 AM [facility] Internal Medicine Resident, PGY-1

## 2022-02-01 NOTE — Assessment & Plan Note (Signed)
Patient with a remote history of asthma exacerbation in 2022, none since.  Albuterol inhahler - uses it every other month, 1x/each. - Albuterol PRN

## 2022-02-02 LAB — CBC
Hematocrit: 39.4 % (ref 34.0–46.6)
Hemoglobin: 12.7 g/dL (ref 11.1–15.9)
MCH: 28.3 pg (ref 26.6–33.0)
MCHC: 32.2 g/dL (ref 31.5–35.7)
MCV: 88 fL (ref 79–97)
Platelets: 248 10*3/uL (ref 150–450)
RBC: 4.48 x10E6/uL (ref 3.77–5.28)
RDW: 14.3 % (ref 11.7–15.4)
WBC: 4.8 10*3/uL (ref 3.4–10.8)

## 2022-02-02 LAB — TSH: TSH: 0.439 u[IU]/mL — ABNORMAL LOW (ref 0.450–4.500)

## 2022-02-02 NOTE — Addendum Note (Signed)
Addended by: Simeon Craft, Verdis Frederickson on: 02/02/2022 11:16 AM   Modules accepted: Orders

## 2022-02-05 ENCOUNTER — Other Ambulatory Visit: Payer: Self-pay | Admitting: Student

## 2022-02-05 DIAGNOSIS — E041 Nontoxic single thyroid nodule: Secondary | ICD-10-CM

## 2022-02-05 LAB — T3

## 2022-02-05 LAB — T4, FREE

## 2022-02-05 NOTE — Progress Notes (Signed)
Re: Work up prior to surgical clearance  Patient called and aware that she is in need of repeat testing as there was not enough sample to run all thyroid tests.

## 2022-02-06 ENCOUNTER — Other Ambulatory Visit: Payer: Medicaid Other

## 2022-02-06 DIAGNOSIS — E041 Nontoxic single thyroid nodule: Secondary | ICD-10-CM

## 2022-02-08 LAB — T3: T3, Total: 135 ng/dL (ref 71–180)

## 2022-02-08 LAB — T4, FREE: Free T4: 0.9 ng/dL (ref 0.82–1.77)

## 2022-02-08 LAB — TSH: TSH: 0.63 u[IU]/mL (ref 0.450–4.500)

## 2022-02-08 NOTE — Progress Notes (Signed)
Patient called, patient aware with thyroid panel wnl.   This was the last test prior to clearance for surgery. With this wnl, patient is now cleared for R total knee arthroplasty. Will fax paper work in 1-2 business days.

## 2022-02-12 ENCOUNTER — Encounter: Payer: Self-pay | Admitting: Physical Medicine and Rehabilitation

## 2022-02-12 ENCOUNTER — Encounter (HOSPITAL_BASED_OUTPATIENT_CLINIC_OR_DEPARTMENT_OTHER): Payer: Medicaid Other | Admitting: Physical Medicine and Rehabilitation

## 2022-02-12 ENCOUNTER — Other Ambulatory Visit: Payer: Self-pay | Admitting: Physical Medicine and Rehabilitation

## 2022-02-12 VITALS — BP 146/85 | HR 71 | Ht 63.0 in | Wt 220.0 lb

## 2022-02-12 DIAGNOSIS — M7918 Myalgia, other site: Secondary | ICD-10-CM

## 2022-02-12 DIAGNOSIS — M545 Low back pain, unspecified: Secondary | ICD-10-CM | POA: Diagnosis not present

## 2022-02-12 MED ORDER — OXYCODONE-ACETAMINOPHEN 5-325 MG PO TABS: 1.0000 | ORAL_TABLET | Freq: Two times a day (BID) | ORAL | 0 refills | Status: DC | PRN

## 2022-02-12 MED ORDER — CYCLOBENZAPRINE HCL 5 MG PO TABS: 5.0000 mg | ORAL_TABLET | Freq: Three times a day (TID) | ORAL | 0 refills | Status: DC | PRN

## 2022-02-12 MED ORDER — LIDOCAINE HCL 1 % IJ SOLN
5.0000 mL | Freq: Once | INTRAMUSCULAR | Status: AC
  Administered 2022-02-12: 5 mL

## 2022-02-12 NOTE — Progress Notes (Signed)

## 2022-02-13 ENCOUNTER — Ambulatory Visit: Payer: Medicaid Other | Admitting: Physical Medicine and Rehabilitation

## 2022-02-13 NOTE — Pre-Procedure Instructions (Signed)
Surgical Instructions    Your procedure is scheduled on Monday, February 5th.  Report to [facility] Main Entrance "A" at 05:30 A.M., then check in with the Admitting office.  Call this number if you have problems the morning of surgery:  [phone]  If you have any questions prior to your surgery date call [phone]: Open Monday-Friday 8am-4pm If you experience any cold or flu symptoms such as cough, fever, chills, shortness of breath, etc. between now and your scheduled surgery, please notify Carrie Franco at the above number.     Remember:  Do not eat after midnight the night before your surgery  You may drink clear liquids until 04:30 AM the morning of your surgery.   Clear liquids allowed are: Water, Non-Citrus Juices (without pulp), Carbonated Beverages, Clear Tea, Black Coffee Only (NO MILK, CREAM OR POWDERED CREAMER of any kind), and Gatorade.   Patient Instructions  The night before surgery:  No food after midnight. ONLY clear liquids after midnight  The day of surgery (if you do NOT have diabetes):  Drink ONE (1) Pre-Surgery Clear Ensure by 04:30 AM the morning of surgery. Drink in one sitting. Do not sip.  This drink was given to you during your hospital  pre-op appointment visit.  Nothing else to drink after completing the  Pre-Surgery Clear Ensure.         If you have questions, please contact your surgeon's office.     Take these medicines the morning of surgery with A SIP OF WATER  amLODipine (NORVASC)  cetirizine (ZYRTEC)  FLUoxetine (PROZAC)  pantoprazole (PROTONIX)    If needed: albuterol (VENTOLIN HFA)- if needed, bring with you on day of surgery cyclobenzaprine (FLEXERIL)  fluticasone (FLONASE)  oxyCODONE-acetaminophen (PERCOCET/ROXICET)  valACYclovir (VALTREX)     As of today, STOP taking any Aspirin (unless otherwise instructed by your surgeon) Aleve, Naproxen, Ibuprofen, Motrin, Advil, Goody's, BC's, all herbal medications, fish oil, and all vitamins.  This includes diclofenac Sodium (VOLTAREN) 1 % GEL.                     Do NOT Smoke (Tobacco/Vaping) for 24 hours prior to your procedure.  If you use a CPAP at night, you may bring your mask/headgear for your overnight stay.   Contacts, glasses, piercing's, hearing aid's, dentures or partials may not be worn into surgery, please bring cases for these belongings.    For patients admitted to the hospital, discharge time will be determined by your treatment team.   Patients discharged the day of surgery will not be allowed to drive home, and someone needs to stay with them for 24 hours.  SURGICAL WAITING ROOM VISITATION Patients having surgery or a procedure may have no more than 2 support people in the waiting area - these visitors may rotate.   Children under the age of 14 must have an adult with them who is not the patient. If the patient needs to stay at the hospital during part of their recovery, the visitor guidelines for inpatient rooms apply. Pre-op nurse will coordinate an appropriate time for 1 support person to accompany patient in pre-op.  This support person may not rotate.   Please refer to the [facility] website for the visitor guidelines for Inpatients (after your surgery is over and you are in a regular room).    Special instructions:   [place]- Preparing For Surgery  Before surgery, you can play an important role. Because skin is not sterile, your skin needs  to be as free of germs as possible. You can reduce the number of germs on your skin by washing with CHG (chlorahexidine gluconate) Soap before surgery.  CHG is an antiseptic cleaner which kills germs and bonds with the skin to continue killing germs even after washing.    Oral Hygiene is also important to reduce your risk of infection.  Remember - BRUSH YOUR TEETH THE MORNING OF SURGERY WITH YOUR REGULAR TOOTHPASTE  Please do not use if you have an allergy to CHG or antibacterial soaps. If your skin becomes  reddened/irritated stop using the CHG.  Do not shave (including legs and underarms) for at least 48 hours prior to first CHG shower. It is OK to shave your face.  Please follow these instructions carefully.   Shower the NIGHT BEFORE SURGERY and the MORNING OF SURGERY  If you chose to wash your hair, wash your hair first as usual with your normal shampoo.  After you shampoo, rinse your hair and body thoroughly to remove the shampoo.  Use CHG Soap as you would any other liquid soap. You can apply CHG directly to the skin and wash gently with a scrungie or a clean washcloth.   Apply the CHG Soap to your body ONLY FROM THE NECK DOWN.  Do not use on open wounds or open sores. Avoid contact with your eyes, ears, mouth and genitals (private parts). Wash Face and genitals (private parts)  with your normal soap.   Wash thoroughly, paying special attention to the area where your surgery will be performed.  Thoroughly rinse your body with warm water from the neck down.  DO NOT shower/wash with your normal soap after using and rinsing off the CHG Soap.  Pat yourself dry with a CLEAN TOWEL.  Wear CLEAN PAJAMAS to bed the night before surgery  Place CLEAN SHEETS on your bed the night before your surgery  DO NOT SLEEP WITH PETS.   Day of Surgery: Take a shower with CHG soap. Do not wear jewelry or makeup Do not wear lotions, powders, perfumes, or deodorant. Do not shave 48 hours prior to surgery.   Do not bring valuables to the hospital. [facility] is not responsible for any belongings or valuables. Do not wear nail polish, gel polish, artificial nails, or any other type of covering on natural nails (fingers and toes) If you have artificial nails or gel coating that need to be removed by a nail salon, please have this removed prior to surgery. Artificial nails or gel coating may interfere with anesthesia's ability to adequately monitor your vital signs. Wear Clean/Comfortable clothing the  morning of surgery Remember to brush your teeth WITH YOUR REGULAR TOOTHPASTE.   Please read over the following fact sheets that you were given.    If you received a COVID test during your pre-op visit  it is requested that you wear a mask when out in public, stay away from anyone that may not be feeling well and notify your surgeon if you develop symptoms. If you have been in contact with anyone that has tested positive in the last 10 days please notify you surgeon.

## 2022-02-14 ENCOUNTER — Encounter: Payer: Self-pay | Admitting: Student

## 2022-02-14 ENCOUNTER — Encounter (HOSPITAL_COMMUNITY)
Admission: RE | Admit: 2022-02-14 | Discharge: 2022-02-14 | Disposition: A | Discharge status: Home / Self Care | Payer: Medicaid Other | Source: Ambulatory Visit | Attending: Orthopedic Surgery | Admitting: Orthopedic Surgery

## 2022-02-14 ENCOUNTER — Ambulatory Visit (INDEPENDENT_AMBULATORY_CARE_PROVIDER_SITE_OTHER): Payer: Medicaid Other | Admitting: Student

## 2022-02-14 ENCOUNTER — Encounter (HOSPITAL_COMMUNITY): Payer: Self-pay

## 2022-02-14 ENCOUNTER — Other Ambulatory Visit: Payer: Self-pay

## 2022-02-14 ENCOUNTER — Ambulatory Visit (HOSPITAL_COMMUNITY)
Admission: EM | Admit: 2022-02-14 | Discharge: 2022-02-14 | Discharge status: Against Medical Advice | Payer: Medicaid Other | Attending: Family Medicine | Admitting: Family Medicine

## 2022-02-14 VITALS — BP 117/69 | HR 70 | Temp 98.2°F | Wt 221.7 lb

## 2022-02-14 VITALS — BP 125/73 | HR 68 | Temp 97.6°F | Resp 18 | Ht 63.0 in | Wt 213.9 lb

## 2022-02-14 DIAGNOSIS — S29012A Strain of muscle and tendon of back wall of thorax, initial encounter: Secondary | ICD-10-CM | POA: Insufficient documentation

## 2022-02-14 DIAGNOSIS — I10 Essential (primary) hypertension: Secondary | ICD-10-CM

## 2022-02-14 DIAGNOSIS — R9431 Abnormal electrocardiogram [ECG] [EKG]: Secondary | ICD-10-CM | POA: Diagnosis not present

## 2022-02-14 DIAGNOSIS — Z01818 Encounter for other preprocedural examination: Secondary | ICD-10-CM | POA: Diagnosis not present

## 2022-02-14 DIAGNOSIS — Z87891 Personal history of nicotine dependence: Secondary | ICD-10-CM

## 2022-02-14 LAB — URINALYSIS, COMPLETE (UACMP) WITH MICROSCOPIC
Bilirubin Urine: NEGATIVE
Glucose, UA: NEGATIVE mg/dL
Ketones, ur: NEGATIVE mg/dL
Nitrite: NEGATIVE
Protein, ur: NEGATIVE mg/dL
Specific Gravity, Urine: 1.012 (ref 1.005–1.030)
WBC, UA: >50 WBC/hpf (ref 0–5)
pH: 6 (ref 5.0–8.0)

## 2022-02-14 LAB — BASIC METABOLIC PANEL
Anion gap: 11 (ref 5–15)
BUN: 9 mg/dL (ref 6–20)
CO2: 23 mmol/L (ref 22–32)
Calcium: 9.3 mg/dL (ref 8.9–10.3)
Chloride: 105 mmol/L (ref 98–111)
Creatinine, Ser: 0.67 mg/dL (ref 0.44–1.00)
GFR, Estimated: >60 mL/min (ref >60)
Glucose, Bld: 108 mg/dL — ABNORMAL HIGH (ref 70–99)
Potassium: 4 mmol/L (ref 3.5–5.1)
Sodium: 139 mmol/L (ref 135–145)

## 2022-02-14 LAB — CBC
HCT: 38 % (ref 36.0–46.0)
Hemoglobin: 12.7 g/dL (ref 12.0–15.0)
MCH: 29.1 pg (ref 26.0–34.0)
MCHC: 33.4 g/dL (ref 30.0–36.0)
MCV: 87 fL (ref 80.0–100.0)
Platelets: 240 10*3/uL (ref 150–400)
RBC: 4.37 MIL/uL (ref 3.87–5.11)
RDW: 13.7 % (ref 11.5–15.5)
WBC: 5 10*3/uL (ref 4.0–10.5)
nRBC: 0 % (ref 0.0–0.2)

## 2022-02-14 LAB — SURGICAL PCR SCREEN
MRSA, PCR: NEGATIVE
Staphylococcus aureus: NEGATIVE

## 2022-02-14 MED ORDER — IBUPROFEN 800 MG PO TABS: 800.0000 mg | ORAL_TABLET | Freq: Four times a day (QID) | ORAL | 0 refills | Status: DC | PRN

## 2022-02-14 NOTE — Patient Instructions (Signed)
  Thank you, Ms.Illyana Domingo Pulse, for allowing [place] to provide your care today. Today we discussed . . .  > Rhomboid Strain       - we believe that you have strained a muscle group in your back called the rhomboids       - you can take ibuprofen '400mg'$  three times or naproxen '500mg'$  two times daily to help relieve the pain       - you can also apply a menthol cream such as IcyHot and ice packs to reduce your symptoms       - most importantly, we are providing you with a referral to physical therapy where you can strengthen your back muscles       - please see the exercise print-out that we have given you for our recommendations on home therapy recommendations   I have ordered the following labs for you:  Lab Orders  No laboratory test(s) ordered today      Tests ordered today:  none   Referrals ordered today:   Referral Orders         Ambulatory referral to Physical Therapy        I have ordered the following medication/changed the following medications:   Stop the following medications: There are no discontinued medications.   Start the following medications: No orders of the defined types were placed in this encounter.     Follow up: 3 months    Remember:  Please start stretching and exercising your back muscles gradually as tolerated. Taking pain medications such as ibuprofen or naproxen, but not both, should provide you with some relief. We are sending you a referral to physical therapy and will see you again in about three months!   Should you have any questions or concerns please call the internal medicine clinic at [phone].     Roswell Nickel, MD [place]
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
## 2022-02-14 NOTE — Assessment & Plan Note (Addendum)
Patient presents with right-sided upper back pain that started four days ago immediately upon waking up. She describes the pain as a dull, constant, achy, throbbing sensation that is worsened by tilting her head backward, yawning, and coughing. Although she sustained a fall several weeks ago, she denies recent trauma, falls, heavy lifting, or changes in physical activity. She mentions receiving a light massage one day before the onset of her symptoms. Since noticing the pain, she states that is has stayed about the same or possibly worsened. She has tried cyclobenzaprine, heat packs, and topical diclofenac, none of which have provided relief. Has not tried ice or targeted stretching exercises. Denies numbness and tingling. Exam notable for focal tenderness between vertebrate and R scapula at T2-T5 level. Pain exacerbated by spine extension and shoulder abduction against resistance. Empty can test negative. Strength in BUEs 5/5 with pain absent on elbow extension-flexion against resistance. These findings, particularly provocative maneuvers and focal distribution of tenderness, are most consistent with a R rhomboid strain.  - Referral to physical therapy  - Start ibuprofen '800mg'$  q6 PRN - Encourage application of ice packs in 32mn intervals - Provide home strengthening-stretching exercises targeting rhomboids

## 2022-02-14 NOTE — Assessment & Plan Note (Signed)
Patient has history of hypertension managed at home with amlodipine, olmesartan, and hydrochlorothiazide. She reports daily adherence, which is confirmed by the electronic medical record. Today in clinic, her BP was 117/69.  - Continue amlodipine '10mg'$  q24, olmesartan '40mg'$  q24, and hydrochlorothiazide 12.'5mg'$  q24

## 2022-02-14 NOTE — ED Notes (Signed)
Informed by front desk staff that Patient left before being seen.

## 2022-02-14 NOTE — Progress Notes (Signed)
PCP - [facility] Cardiologist - denies  PPM/ICD - denies   Chest x-ray - 09/08/20 EKG - 02/14/22 Stress Test - denies ECHO - denies Cardiac Cath - denies  Sleep Study - denies   DM- denies    ASA/Blood Thinner Instructions: n/a   ERAS Protcol - yes PRE-SURGERY Ensure given at PAT  COVID TEST- n/a   Anesthesia review: no  Patient denies shortness of breath, fever, cough and chest pain at PAT appointment   All instructions explained to the patient, with a verbal understanding of the material. Patient agrees to go over the instructions while at home for a better understanding. The opportunity to ask questions was provided.

## 2022-02-15 ENCOUNTER — Encounter: Payer: Medicare HMO | Admitting: Registered Nurse

## 2022-02-15 LAB — URINE CULTURE: Culture: NO GROWTH

## 2022-02-15 MED ORDER — OXYCODONE-ACETAMINOPHEN 5-325 MG PO TABS: 1.0000 | ORAL_TABLET | Freq: Two times a day (BID) | ORAL | 0 refills | Status: DC | PRN

## 2022-02-15 NOTE — Progress Notes (Signed)
Pt's UA came back with positive leukocytes. Carrie Franco, [place], notified and says he will watch for results for UC.

## 2022-02-16 ENCOUNTER — Ambulatory Visit (HOSPITAL_COMMUNITY)
Admission: RE | Admit: 2022-02-16 | Discharge: 2022-02-16 | Disposition: A | Discharge status: Home / Self Care | Payer: Medicare HMO | Source: Ambulatory Visit | Attending: Internal Medicine | Admitting: Internal Medicine

## 2022-02-16 DIAGNOSIS — E041 Nontoxic single thyroid nodule: Secondary | ICD-10-CM | POA: Diagnosis not present

## 2022-02-16 MED ORDER — TRANEXAMIC ACID 1000 MG/10ML IV SOLN
2000.0000 mg | INTRAVENOUS | Status: DC
  Filled 2022-02-16: qty 20

## 2022-02-16 MED ORDER — TRANEXAMIC ACID 1000 MG/10ML IV SOLN
2000.0000 mg | INTRAVENOUS | Status: DC
  Filled 2022-02-16 (×2): qty 20

## 2022-02-18 NOTE — Anesthesia Preprocedure Evaluation (Signed)
Anesthesia Evaluation  Patient identified by MRN, date of birth, ID band Patient awake    Reviewed: Allergy & Precautions, NPO status , Patient's Chart, lab work & pertinent test results  History of Anesthesia Complications Negative for: history of anesthetic complications  Airway Mallampati: I  TM Distance: >3 FB Neck ROM: Full    Dental  (+) Dental Advisory Given, Edentulous Upper, Edentulous Lower   Pulmonary asthma , former smoker   Pulmonary exam normal        Cardiovascular hypertension, Pt. on medications Normal cardiovascular exam     Neuro/Psych  PSYCHIATRIC DISORDERS  Depression       GI/Hepatic Neg liver ROS,GERD  Medicated,,  Endo/Other  negative endocrine ROS    Renal/GU negative Renal ROS   Genital herpes, recurrent bacterial vaginitis    Musculoskeletal  (+) Arthritis ,    Abdominal  (+)  Abdomen: soft. Bowel sounds: normal.  Peds  Hematology   Anesthesia Other Findings   Reproductive/Obstetrics                             Anesthesia Physical Anesthesia Plan  ASA: 2  Anesthesia Plan: General   Post-op Pain Management: Regional block*, Tylenol PO (pre-op)* and Toradol IV (intra-op)*   Induction: Intravenous  PONV Risk Score and Plan: 4 or greater and Ondansetron, Dexamethasone, Midazolam and Scopolamine patch - Pre-op  Airway Management Planned: Oral ETT  Additional Equipment: None  Intra-op Plan:   Post-operative Plan: Extubation in OR  Informed Consent: I have reviewed the patients History and Physical, chart, labs and discussed the procedure including the risks, benefits and alternatives for the proposed anesthesia with the patient or authorized representative who has indicated his/her understanding and acceptance.     Dental advisory given  Plan Discussed with: Anesthesiologist and CRNA  Anesthesia Plan Comments:         Anesthesia Quick  Evaluation

## 2022-02-19 ENCOUNTER — Observation Stay (HOSPITAL_COMMUNITY)
Admission: RE | Admit: 2022-02-19 | Discharge: 2022-02-20 | Disposition: A | Discharge status: Home / Self Care | Payer: Medicare HMO | Attending: Orthopedic Surgery | Admitting: Orthopedic Surgery

## 2022-02-19 ENCOUNTER — Encounter (HOSPITAL_COMMUNITY): Payer: Self-pay | Admitting: Orthopedic Surgery

## 2022-02-19 ENCOUNTER — Encounter (HOSPITAL_COMMUNITY)
Admission: RE | Disposition: A | Discharge status: Home / Self Care | Payer: Self-pay | Source: Home / Self Care | Attending: Orthopedic Surgery

## 2022-02-19 ENCOUNTER — Other Ambulatory Visit: Payer: Self-pay

## 2022-02-19 ENCOUNTER — Ambulatory Visit (HOSPITAL_COMMUNITY): Payer: Medicare HMO | Admitting: Physician Assistant

## 2022-02-19 ENCOUNTER — Ambulatory Visit (HOSPITAL_BASED_OUTPATIENT_CLINIC_OR_DEPARTMENT_OTHER): Payer: Medicare HMO | Admitting: Physician Assistant

## 2022-02-19 DIAGNOSIS — M1711 Unilateral primary osteoarthritis, right knee: Principal | ICD-10-CM

## 2022-02-19 DIAGNOSIS — Z96651 Presence of right artificial knee joint: Secondary | ICD-10-CM

## 2022-02-19 DIAGNOSIS — G8918 Other acute postprocedural pain: Secondary | ICD-10-CM | POA: Diagnosis not present

## 2022-02-19 DIAGNOSIS — I10 Essential (primary) hypertension: Secondary | ICD-10-CM | POA: Insufficient documentation

## 2022-02-19 DIAGNOSIS — J45909 Unspecified asthma, uncomplicated: Secondary | ICD-10-CM | POA: Diagnosis not present

## 2022-02-19 DIAGNOSIS — Z87891 Personal history of nicotine dependence: Secondary | ICD-10-CM | POA: Diagnosis not present

## 2022-02-19 DIAGNOSIS — Z01818 Encounter for other preprocedural examination: Secondary | ICD-10-CM

## 2022-02-19 HISTORY — PX: TOTAL KNEE ARTHROPLASTY: SHX125

## 2022-02-19 HISTORY — DX: Presence of right artificial knee joint: Z96.651

## 2022-02-19 SURGERY — ARTHROPLASTY, KNEE, TOTAL
Anesthesia: General | Site: Knee | Laterality: Right

## 2022-02-19 MED ORDER — METOCLOPRAMIDE HCL 5 MG PO TABS: 5.0000 mg | ORAL_TABLET | Freq: Three times a day (TID) | ORAL | Status: DC | PRN

## 2022-02-19 MED ORDER — PHENYLEPHRINE 80 MCG/ML (10ML) SYRINGE FOR IV PUSH (FOR BLOOD PRESSURE SUPPORT)
PREFILLED_SYRINGE | INTRAVENOUS | Status: DC | PRN
  Administered 2022-02-19 (×4): 160 ug via INTRAVENOUS
  Administered 2022-02-19 (×2): 80 ug via INTRAVENOUS

## 2022-02-19 MED ORDER — DEXAMETHASONE SODIUM PHOSPHATE 10 MG/ML IJ SOLN
INTRAMUSCULAR | Status: DC | PRN
  Administered 2022-02-19: 10 mg via INTRAVENOUS

## 2022-02-19 MED ORDER — FENTANYL CITRATE (PF) 100 MCG/2ML IJ SOLN
INTRAMUSCULAR | Status: AC
  Filled 2022-02-19: qty 2

## 2022-02-19 MED ORDER — ACETAMINOPHEN 500 MG PO TABS
1000.0000 mg | ORAL_TABLET | Freq: Once | ORAL | Status: AC
  Administered 2022-02-19: 1000 mg via ORAL
  Filled 2022-02-19: qty 2

## 2022-02-19 MED ORDER — METHOCARBAMOL 500 MG PO TABS
500.0000 mg | ORAL_TABLET | Freq: Four times a day (QID) | ORAL | Status: DC | PRN
  Administered 2022-02-20 (×2): 500 mg via ORAL
  Filled 2022-02-19 (×2): qty 1

## 2022-02-19 MED ORDER — DEXMEDETOMIDINE HCL IN NACL 80 MCG/20ML IV SOLN
INTRAVENOUS | Status: DC | PRN
  Administered 2022-02-19: 8 ug via BUCCAL
  Administered 2022-02-19: 4 ug via BUCCAL

## 2022-02-19 MED ORDER — HYDROMORPHONE HCL 1 MG/ML IJ SOLN
0.5000 mg | INTRAMUSCULAR | Status: DC | PRN
  Administered 2022-02-19 – 2022-02-20 (×5): 0.5 mg via INTRAVENOUS
  Filled 2022-02-19 (×5): qty 0.5

## 2022-02-19 MED ORDER — BUPIVACAINE HCL 0.25 % IJ SOLN
INTRAMUSCULAR | Status: DC | PRN
  Administered 2022-02-19: 30 mL

## 2022-02-19 MED ORDER — PHENYLEPHRINE 80 MCG/ML (10ML) SYRINGE FOR IV PUSH (FOR BLOOD PRESSURE SUPPORT)
PREFILLED_SYRINGE | INTRAVENOUS | Status: AC
  Filled 2022-02-19: qty 10

## 2022-02-19 MED ORDER — BUPIVACAINE LIPOSOME 1.3 % IJ SUSP
INTRAMUSCULAR | Status: DC | PRN
  Administered 2022-02-19: 20 mL

## 2022-02-19 MED ORDER — DOCUSATE SODIUM 100 MG PO CAPS
100.0000 mg | ORAL_CAPSULE | Freq: Two times a day (BID) | ORAL | Status: DC
  Administered 2022-02-19 – 2022-02-20 (×2): 100 mg via ORAL
  Filled 2022-02-19 (×2): qty 1

## 2022-02-19 MED ORDER — ONDANSETRON HCL 4 MG PO TABS: 4.0000 mg | ORAL_TABLET | Freq: Four times a day (QID) | ORAL | Status: DC | PRN

## 2022-02-19 MED ORDER — MIDAZOLAM HCL 2 MG/2ML IJ SOLN
INTRAMUSCULAR | Status: AC
  Filled 2022-02-19: qty 2

## 2022-02-19 MED ORDER — FENTANYL CITRATE (PF) 250 MCG/5ML IJ SOLN
INTRAMUSCULAR | Status: DC | PRN
  Administered 2022-02-19 (×3): 50 ug via INTRAVENOUS
  Administered 2022-02-19: 25 ug via INTRAVENOUS
  Administered 2022-02-19: 150 ug via INTRAVENOUS
  Administered 2022-02-19: 25 ug via INTRAVENOUS
  Administered 2022-02-19: 50 ug via INTRAVENOUS
  Administered 2022-02-19: 100 ug via INTRAVENOUS

## 2022-02-19 MED ORDER — VANCOMYCIN HCL IN DEXTROSE 1-5 GM/200ML-% IV SOLN
1000.0000 mg | INTRAVENOUS | Status: AC
  Administered 2022-02-19: 1000 mg via INTRAVENOUS
  Filled 2022-02-19: qty 200

## 2022-02-19 MED ORDER — PROPOFOL 10 MG/ML IV BOLUS
INTRAVENOUS | Status: DC | PRN
  Administered 2022-02-19: 20 mg via INTRAVENOUS
  Administered 2022-02-19: 30 mg via INTRAVENOUS
  Administered 2022-02-19: 50 mg via INTRAVENOUS
  Administered 2022-02-19: 170 mg via INTRAVENOUS

## 2022-02-19 MED ORDER — ALBUTEROL SULFATE HFA 108 (90 BASE) MCG/ACT IN AERS
INHALATION_SPRAY | RESPIRATORY_TRACT | Status: AC
  Filled 2022-02-19: qty 6.7

## 2022-02-19 MED ORDER — DEXMEDETOMIDINE HCL IN NACL 80 MCG/20ML IV SOLN
INTRAVENOUS | Status: AC
  Filled 2022-02-19: qty 20

## 2022-02-19 MED ORDER — IRRISEPT - 450ML BOTTLE WITH 0.05% CHG IN STERILE WATER, USP 99.95% OPTIME
TOPICAL | Status: DC | PRN
  Administered 2022-02-19: 450 mL via TOPICAL

## 2022-02-19 MED ORDER — ONDANSETRON HCL 4 MG/2ML IJ SOLN
INTRAMUSCULAR | Status: DC | PRN
  Administered 2022-02-19: 4 mg via INTRAVENOUS

## 2022-02-19 MED ORDER — AMLODIPINE BESYLATE 10 MG PO TABS
10.0000 mg | ORAL_TABLET | Freq: Every day | ORAL | Status: DC
  Administered 2022-02-20: 10 mg via ORAL
  Filled 2022-02-19: qty 1

## 2022-02-19 MED ORDER — LACTATED RINGERS IV SOLN: INTRAVENOUS | Status: DC

## 2022-02-19 MED ORDER — FENTANYL CITRATE (PF) 250 MCG/5ML IJ SOLN
INTRAMUSCULAR | Status: AC
  Filled 2022-02-19: qty 5

## 2022-02-19 MED ORDER — PROPOFOL 10 MG/ML IV BOLUS
INTRAVENOUS | Status: AC
  Filled 2022-02-19: qty 20

## 2022-02-19 MED ORDER — SUGAMMADEX SODIUM 200 MG/2ML IV SOLN
INTRAVENOUS | Status: DC | PRN
  Administered 2022-02-19 (×2): 100 mg via INTRAVENOUS

## 2022-02-19 MED ORDER — ASPIRIN 81 MG PO CHEW
81.0000 mg | CHEWABLE_TABLET | Freq: Two times a day (BID) | ORAL | Status: DC
  Administered 2022-02-19 – 2022-02-20 (×2): 81 mg via ORAL
  Filled 2022-02-19 (×2): qty 1

## 2022-02-19 MED ORDER — HYDROMORPHONE HCL 1 MG/ML IJ SOLN
INTRAMUSCULAR | Status: AC
  Filled 2022-02-19: qty 0.5

## 2022-02-19 MED ORDER — AMISULPRIDE (ANTIEMETIC) 5 MG/2ML IV SOLN: 10.0000 mg | Freq: Once | INTRAVENOUS | Status: DC | PRN

## 2022-02-19 MED ORDER — DEXAMETHASONE SODIUM PHOSPHATE 10 MG/ML IJ SOLN
INTRAMUSCULAR | Status: AC
  Filled 2022-02-19: qty 1

## 2022-02-19 MED ORDER — POVIDONE-IODINE 10 % EX SWAB: 2.0000 | Freq: Once | CUTANEOUS | Status: DC

## 2022-02-19 MED ORDER — MENTHOL 3 MG MT LOZG: 1.0000 | LOZENGE | OROMUCOSAL | Status: DC | PRN

## 2022-02-19 MED ORDER — VANCOMYCIN HCL 1000 MG IV SOLR
INTRAVENOUS | Status: AC
  Filled 2022-02-19: qty 20

## 2022-02-19 MED ORDER — METOCLOPRAMIDE HCL 5 MG/ML IJ SOLN: 5.0000 mg | Freq: Three times a day (TID) | INTRAMUSCULAR | Status: DC | PRN

## 2022-02-19 MED ORDER — MIDAZOLAM HCL 2 MG/2ML IJ SOLN
INTRAMUSCULAR | Status: DC | PRN
  Administered 2022-02-19 (×2): 1 mg via INTRAVENOUS

## 2022-02-19 MED ORDER — CLONIDINE HCL (ANALGESIA) 100 MCG/ML EP SOLN
EPIDURAL | Status: AC
  Filled 2022-02-19: qty 10

## 2022-02-19 MED ORDER — LIDOCAINE 2% (20 MG/ML) 5 ML SYRINGE
INTRAMUSCULAR | Status: AC
  Filled 2022-02-19: qty 5

## 2022-02-19 MED ORDER — EPHEDRINE SULFATE-NACL 50-0.9 MG/10ML-% IV SOSY
PREFILLED_SYRINGE | INTRAVENOUS | Status: DC | PRN
  Administered 2022-02-19: 10 mg via INTRAVENOUS
  Administered 2022-02-19 (×3): 5 mg via INTRAVENOUS

## 2022-02-19 MED ORDER — FENTANYL CITRATE (PF) 100 MCG/2ML IJ SOLN
25.0000 ug | INTRAMUSCULAR | Status: DC | PRN
  Administered 2022-02-19: 25 ug via INTRAVENOUS

## 2022-02-19 MED ORDER — CEFAZOLIN SODIUM-DEXTROSE 2-4 GM/100ML-% IV SOLN
2.0000 g | Freq: Three times a day (TID) | INTRAVENOUS | Status: AC
  Administered 2022-02-19 – 2022-02-20 (×3): 2 g via INTRAVENOUS
  Filled 2022-02-19 (×3): qty 100

## 2022-02-19 MED ORDER — ACETAMINOPHEN 500 MG PO TABS
1000.0000 mg | ORAL_TABLET | Freq: Four times a day (QID) | ORAL | Status: AC
  Administered 2022-02-19 – 2022-02-20 (×4): 1000 mg via ORAL
  Filled 2022-02-19 (×4): qty 2

## 2022-02-19 MED ORDER — ONDANSETRON HCL 4 MG/2ML IJ SOLN
INTRAMUSCULAR | Status: AC
  Filled 2022-02-19: qty 2

## 2022-02-19 MED ORDER — ROCURONIUM BROMIDE 10 MG/ML (PF) SYRINGE
PREFILLED_SYRINGE | INTRAVENOUS | Status: AC
  Filled 2022-02-19: qty 10

## 2022-02-19 MED ORDER — PROMETHAZINE HCL 25 MG/ML IJ SOLN: 6.2500 mg | INTRAMUSCULAR | Status: DC | PRN

## 2022-02-19 MED ORDER — METHOCARBAMOL 1000 MG/10ML IJ SOLN: 500.0000 mg | Freq: Four times a day (QID) | INTRAVENOUS | Status: DC | PRN

## 2022-02-19 MED ORDER — ROCURONIUM BROMIDE 10 MG/ML (PF) SYRINGE
PREFILLED_SYRINGE | INTRAVENOUS | Status: DC | PRN
  Administered 2022-02-19: 80 mg via INTRAVENOUS
  Administered 2022-02-19: 20 mg via INTRAVENOUS

## 2022-02-19 MED ORDER — TRANEXAMIC ACID 1000 MG/10ML IV SOLN
INTRAVENOUS | Status: DC | PRN
  Administered 2022-02-19: 2000 mg via TOPICAL

## 2022-02-19 MED ORDER — FLUOXETINE HCL 20 MG PO CAPS
40.0000 mg | ORAL_CAPSULE | Freq: Every day | ORAL | Status: DC
  Filled 2022-02-19: qty 2

## 2022-02-19 MED ORDER — LIDOCAINE 2% (20 MG/ML) 5 ML SYRINGE
INTRAMUSCULAR | Status: DC | PRN
  Administered 2022-02-19: 40 mg via INTRAVENOUS

## 2022-02-19 MED ORDER — TRANEXAMIC ACID-NACL 1000-0.7 MG/100ML-% IV SOLN
1000.0000 mg | INTRAVENOUS | Status: AC
  Administered 2022-02-19: 1000 mg via INTRAVENOUS
  Filled 2022-02-19: qty 100

## 2022-02-19 MED ORDER — IRBESARTAN 300 MG PO TABS
300.0000 mg | ORAL_TABLET | Freq: Every day | ORAL | Status: DC
  Filled 2022-02-19: qty 1

## 2022-02-19 MED ORDER — NAPROXEN 250 MG PO TABS
250.0000 mg | ORAL_TABLET | Freq: Two times a day (BID) | ORAL | Status: DC
  Administered 2022-02-19 – 2022-02-20 (×2): 250 mg via ORAL
  Filled 2022-02-19 (×2): qty 1

## 2022-02-19 MED ORDER — ROPIVACAINE HCL 7.5 MG/ML IJ SOLN
INTRAMUSCULAR | Status: DC | PRN
  Administered 2022-02-19: 20 mL via PERINEURAL

## 2022-02-19 MED ORDER — ORAL CARE MOUTH RINSE: 15.0000 mL | Freq: Once | OROMUCOSAL | Status: AC

## 2022-02-19 MED ORDER — ALBUTEROL SULFATE HFA 108 (90 BASE) MCG/ACT IN AERS
INHALATION_SPRAY | RESPIRATORY_TRACT | Status: DC | PRN
  Administered 2022-02-19 (×2): 2 via RESPIRATORY_TRACT

## 2022-02-19 MED ORDER — SODIUM CHLORIDE (PF) 0.9 % IJ SOLN
INTRAMUSCULAR | Status: AC
  Filled 2022-02-19: qty 10

## 2022-02-19 MED ORDER — BUPIVACAINE LIPOSOME 1.3 % IJ SUSP
INTRAMUSCULAR | Status: AC
  Filled 2022-02-19: qty 20

## 2022-02-19 MED ORDER — SODIUM CHLORIDE 0.9 % IR SOLN
Status: DC | PRN
  Administered 2022-02-19: 3000 mL

## 2022-02-19 MED ORDER — POVIDONE-IODINE 7.5 % EX SOLN
Freq: Once | CUTANEOUS | Status: DC
  Filled 2022-02-19: qty 118

## 2022-02-19 MED ORDER — ACETAMINOPHEN 325 MG PO TABS: 325.0000 mg | ORAL_TABLET | Freq: Four times a day (QID) | ORAL | Status: DC | PRN

## 2022-02-19 MED ORDER — ONDANSETRON HCL 4 MG/2ML IJ SOLN
4.0000 mg | Freq: Four times a day (QID) | INTRAMUSCULAR | Status: DC | PRN
  Administered 2022-02-20: 4 mg via INTRAVENOUS
  Filled 2022-02-19: qty 2

## 2022-02-19 MED ORDER — MORPHINE SULFATE (PF) 4 MG/ML IV SOLN
INTRAVENOUS | Status: AC
  Filled 2022-02-19: qty 2

## 2022-02-19 MED ORDER — PANTOPRAZOLE SODIUM 40 MG PO TBEC
40.0000 mg | DELAYED_RELEASE_TABLET | Freq: Every day | ORAL | Status: DC
  Administered 2022-02-20: 40 mg via ORAL
  Filled 2022-02-19: qty 1

## 2022-02-19 MED ORDER — MORPHINE SULFATE (PF) 4 MG/ML IV SOLN
INTRAVENOUS | Status: DC | PRN
  Administered 2022-02-19: 8 mg

## 2022-02-19 MED ORDER — OXYCODONE HCL 5 MG PO TABS
5.0000 mg | ORAL_TABLET | ORAL | Status: DC | PRN
  Administered 2022-02-19 – 2022-02-20 (×5): 10 mg via ORAL
  Filled 2022-02-19 (×5): qty 2

## 2022-02-19 MED ORDER — VANCOMYCIN HCL 1000 MG IV SOLR
INTRAVENOUS | Status: DC | PRN
  Administered 2022-02-19: 1000 mg via TOPICAL

## 2022-02-19 MED ORDER — LORATADINE 10 MG PO TABS
10.0000 mg | ORAL_TABLET | Freq: Every day | ORAL | Status: DC
  Administered 2022-02-19 – 2022-02-20 (×2): 10 mg via ORAL
  Filled 2022-02-19 (×2): qty 1

## 2022-02-19 MED ORDER — CEFAZOLIN SODIUM-DEXTROSE 1-4 GM/50ML-% IV SOLN
INTRAVENOUS | Status: DC | PRN
  Administered 2022-02-19: 1 g via INTRAVENOUS

## 2022-02-19 MED ORDER — SODIUM CHLORIDE 0.9% FLUSH
INTRAVENOUS | Status: DC | PRN
  Administered 2022-02-19 (×2): 10 mL

## 2022-02-19 MED ORDER — BUPIVACAINE HCL (PF) 0.25 % IJ SOLN
INTRAMUSCULAR | Status: AC
  Filled 2022-02-19: qty 30

## 2022-02-19 MED ORDER — PHENOL 1.4 % MT LIQD: 1.0000 | OROMUCOSAL | Status: DC | PRN

## 2022-02-19 MED ORDER — CLONIDINE HCL (ANALGESIA) 100 MCG/ML EP SOLN
EPIDURAL | Status: DC | PRN
  Administered 2022-02-19: 1 mL

## 2022-02-19 MED ORDER — 0.9 % SODIUM CHLORIDE (POUR BTL) OPTIME
TOPICAL | Status: DC | PRN
  Administered 2022-02-19 (×5): 1000 mL

## 2022-02-19 MED ORDER — HYDROMORPHONE HCL 1 MG/ML IJ SOLN
INTRAMUSCULAR | Status: DC | PRN
  Administered 2022-02-19: .5 mg via INTRAVENOUS

## 2022-02-19 MED ORDER — ALBUMIN HUMAN 5 % IV SOLN: INTRAVENOUS | Status: DC | PRN

## 2022-02-19 MED ORDER — CHLORHEXIDINE GLUCONATE 0.12 % MT SOLN
15.0000 mL | Freq: Once | OROMUCOSAL | Status: AC
  Administered 2022-02-19: 15 mL via OROMUCOSAL
  Filled 2022-02-19: qty 15

## 2022-02-19 MED ORDER — DIPHENHYDRAMINE HCL 50 MG/ML IJ SOLN
INTRAMUSCULAR | Status: AC
  Filled 2022-02-19: qty 1

## 2022-02-19 MED ORDER — CEFAZOLIN SODIUM 1 G IJ SOLR
INTRAMUSCULAR | Status: AC
  Filled 2022-02-19: qty 10

## 2022-02-19 MED ORDER — DIPHENHYDRAMINE HCL 50 MG/ML IJ SOLN
INTRAMUSCULAR | Status: DC | PRN
  Administered 2022-02-19: 12.5 mg via INTRAVENOUS

## 2022-02-19 MED ORDER — ALBUTEROL SULFATE (2.5 MG/3ML) 0.083% IN NEBU: 3.0000 mL | INHALATION_SOLUTION | Freq: Every day | RESPIRATORY_TRACT | Status: DC | PRN

## 2022-02-19 MED ORDER — PROPOFOL 1000 MG/100ML IV EMUL
INTRAVENOUS | Status: AC
  Filled 2022-02-19: qty 100

## 2022-02-19 SURGICAL SUPPLY — 94 items
BAG COUNTER SPONGE SURGICOUNT (BAG) ×1 IMPLANT
BAG DECANTER FOR FLEXI CONT (MISCELLANEOUS) ×1 IMPLANT
BAG SPNG CNTER NS LX DISP (BAG) ×1
BANDAGE ESMARK 6X9 LF (GAUZE/BANDAGES/DRESSINGS) ×1 IMPLANT
BLADE SAG 18X100X1.27 (BLADE) ×1 IMPLANT
BLADE SAGITTAL (BLADE) ×1
BLADE SAW SGTL 11.0X1.19X90.0M (BLADE) IMPLANT
BLADE SAW THK.89X75X18XSGTL (BLADE) ×1 IMPLANT
BNDG CMPR 9X6 STRL LF SNTH (GAUZE/BANDAGES/DRESSINGS) ×1
BNDG CMPR MED 15X6 ELC VLCR LF (GAUZE/BANDAGES/DRESSINGS) ×1
BNDG COHESIVE 1X5 TAN STRL LF (GAUZE/BANDAGES/DRESSINGS) IMPLANT
BNDG COHESIVE 6X5 TAN STRL LF (GAUZE/BANDAGES/DRESSINGS) ×1 IMPLANT
BNDG ELASTIC 6X15 VLCR STRL LF (GAUZE/BANDAGES/DRESSINGS) ×1 IMPLANT
BNDG ESMARK 6X9 LF (GAUZE/BANDAGES/DRESSINGS) ×1
BOWL SMART MIX CTS (DISPOSABLE) IMPLANT
CNTNR URN SCR LID CUP LEK RST (MISCELLANEOUS) ×1 IMPLANT
COMP FEM PS KNEE NRW 7 RT (Joint) ×1 IMPLANT
COMP PATELLAR 10X35 METAL (Joint) ×1 IMPLANT
COMPONENT FEM PS KNEE NRW 7 RT (Joint) IMPLANT
COMPONENT PATELLAR 10X35 METAL (Joint) IMPLANT
CONT SPEC 4OZ STRL OR WHT (MISCELLANEOUS) ×1
COOLER ICEMAN CLASSIC (MISCELLANEOUS) IMPLANT
COVER SURGICAL LIGHT HANDLE (MISCELLANEOUS) ×1 IMPLANT
CUFF TOURN SGL QUICK 34 (TOURNIQUET CUFF) ×1
CUFF TOURN SGL QUICK 42 (TOURNIQUET CUFF) IMPLANT
CUFF TRNQT CYL 34X4.125X (TOURNIQUET CUFF) ×1 IMPLANT
DRAPE INCISE IOBAN 66X45 STRL (DRAPES) IMPLANT
DRAPE ORTHO SPLIT 77X108 STRL (DRAPES) ×2
DRAPE SURG ORHT 6 SPLT 77X108 (DRAPES) ×3 IMPLANT
DRAPE U-SHAPE 47X51 STRL (DRAPES) ×1 IMPLANT
DRSG AQUACEL AG ADV 3.5X14 (GAUZE/BANDAGES/DRESSINGS) IMPLANT
DURAPREP 26ML APPLICATOR (WOUND CARE) ×2 IMPLANT
ELECT CAUTERY BLADE 6.4 (BLADE) ×1 IMPLANT
ELECT REM PT RETURN 9FT ADLT (ELECTROSURGICAL) ×1
ELECTRODE REM PT RTRN 9FT ADLT (ELECTROSURGICAL) ×1 IMPLANT
GAUZE SPONGE 4X4 12PLY STRL (GAUZE/BANDAGES/DRESSINGS) ×1 IMPLANT
GLOVE BIOGEL PI IND STRL 7.0 (GLOVE) ×1 IMPLANT
GLOVE BIOGEL PI IND STRL 8 (GLOVE) ×1 IMPLANT
GLOVE ECLIPSE 7.0 STRL STRAW (GLOVE) ×1 IMPLANT
GLOVE ECLIPSE 8.0 STRL XLNG CF (GLOVE) ×1 IMPLANT
GLOVE SURG ENC MOIS LTX SZ6.5 (GLOVE) ×3 IMPLANT
GOWN STRL REUS W/ TWL LRG LVL3 (GOWN DISPOSABLE) ×3 IMPLANT
GOWN STRL REUS W/TWL LRG LVL3 (GOWN DISPOSABLE) ×3
HANDPIECE INTERPULSE COAX TIP (DISPOSABLE) ×1
HDLS TROCR DRIL PIN KNEE 75 (PIN) ×1
HOOD PEEL AWAY FLYTE STAYCOOL (MISCELLANEOUS) ×3 IMPLANT
IMMOBILIZER KNEE 20 (SOFTGOODS)
IMMOBILIZER KNEE 20 THIGH 36 (SOFTGOODS) IMPLANT
IMMOBILIZER KNEE 22 UNIV (SOFTGOODS) IMPLANT
IMMOBILIZER KNEE 24 THIGH 36 (MISCELLANEOUS) IMPLANT
IMMOBILIZER KNEE 24 UNIV (MISCELLANEOUS)
KIT BASIN OR (CUSTOM PROCEDURE TRAY) ×1 IMPLANT
KIT TURNOVER KIT B (KITS) ×1 IMPLANT
LINER ASF PERS 10X6/7 CD RT (Liner) IMPLANT
MANIFOLD NEPTUNE II (INSTRUMENTS) ×1 IMPLANT
NDL 22X1.5 STRL (OR ONLY) (MISCELLANEOUS) ×2 IMPLANT
NDL SPNL 18GX3.5 QUINCKE PK (NEEDLE) ×1 IMPLANT
NDL SUT 6 .5 CRC .975X.05 MAYO (NEEDLE) IMPLANT
NEEDLE 22X1.5 STRL (OR ONLY) (MISCELLANEOUS) ×1 IMPLANT
NEEDLE MAYO TAPER (NEEDLE) ×1
NEEDLE SPNL 18GX3.5 QUINCKE PK (NEEDLE) ×1 IMPLANT
NS IRRIG 1000ML POUR BTL (IV SOLUTION) ×2 IMPLANT
PACK TOTAL JOINT (CUSTOM PROCEDURE TRAY) ×1 IMPLANT
PAD ARMBOARD 7.5X6 YLW CONV (MISCELLANEOUS) ×2 IMPLANT
PAD CAST 4YDX4 CTTN HI CHSV (CAST SUPPLIES) ×1 IMPLANT
PAD COLD SHLDR WRAP-ON (PAD) IMPLANT
PADDING CAST COTTON 4X4 STRL (CAST SUPPLIES)
PADDING CAST COTTON 6X4 STRL (CAST SUPPLIES) ×1 IMPLANT
PIN DRILL HDLS TROCAR 75 4PK (PIN) IMPLANT
SCREW FEMALE HEX FIX 25X2.5 (ORTHOPEDIC DISPOSABLE SUPPLIES) IMPLANT
SET HNDPC FAN SPRY TIP SCT (DISPOSABLE) ×1 IMPLANT
SPIKE FLUID TRANSFER (MISCELLANEOUS) ×1 IMPLANT
STEM TIB PS KNEE D 0D RT (Stem) IMPLANT
STRIP CLOSURE SKIN 1/2X4 (GAUZE/BANDAGES/DRESSINGS) ×2 IMPLANT
SUCTION FRAZIER HANDLE 10FR (MISCELLANEOUS) ×1
SUCTION TUBE FRAZIER 10FR DISP (MISCELLANEOUS) ×1 IMPLANT
SUT ETHILON 3 0 PS 1 (SUTURE) IMPLANT
SUT MAXBRAID #2 CVD NDL (SUTURE) IMPLANT
SUT MNCRL AB 3-0 PS2 18 (SUTURE) ×1 IMPLANT
SUT VIC AB 0 CT1 27 (SUTURE) ×3
SUT VIC AB 0 CT1 27XBRD ANBCTR (SUTURE) ×3 IMPLANT
SUT VIC AB 1 CT1 27 (SUTURE) ×4
SUT VIC AB 1 CT1 27XBRD ANBCTR (SUTURE) IMPLANT
SUT VIC AB 1 CT1 36 (SUTURE) ×5 IMPLANT
SUT VIC AB 1 CTX 27 (SUTURE) IMPLANT
SUT VIC AB 2-0 CT1 27 (SUTURE) ×5
SUT VIC AB 2-0 CT1 TAPERPNT 27 (SUTURE) ×4 IMPLANT
SYR 30ML LL (SYRINGE) ×3 IMPLANT
SYR TB 1ML LUER SLIP (SYRINGE) ×1 IMPLANT
TOWEL GREEN STERILE (TOWEL DISPOSABLE) ×2 IMPLANT
TOWEL GREEN STERILE FF (TOWEL DISPOSABLE) ×2 IMPLANT
TRAY CATH 16FR W/PLASTIC CATH (SET/KITS/TRAYS/PACK) IMPLANT
WATER STERILE IRR 1000ML POUR (IV SOLUTION) IMPLANT
YANKAUER SUCT BULB TIP NO VENT (SUCTIONS) ×1 IMPLANT
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
## 2022-02-19 NOTE — Anesthesia Postprocedure Evaluation (Signed)
Anesthesia Post Note  Patient: Carrie Franco  Procedure(s) Performed: RIGHT TOTAL KNEE ARTHROPLASTY (Right: Knee)     Patient location during evaluation: PACU Anesthesia Type: General Level of consciousness: sedated Pain management: pain level controlled Vital Signs Assessment: post-procedure vital signs reviewed and stable Respiratory status: spontaneous breathing and respiratory function stable Cardiovascular status: stable Postop Assessment: no apparent nausea or vomiting Anesthetic complications: no   No notable events documented.  Last Vitals:  Vitals:   02/19/22 1230 02/19/22 1247  BP: 133/82 121/78  Pulse: 92 90  Resp: 15 16  Temp:  36.4 C  SpO2: 94% 97%    Last Pain:  Vitals:   02/19/22 1247  TempSrc: Oral  PainSc:                  [name]

## 2022-02-19 NOTE — Progress Notes (Signed)
Orthopedic Tech Progress Note Patient Details:  Carrie Franco 03/30/1965 [id number]  CPM Right Knee CPM Right Knee: On Right Knee Flexion (Degrees): 10 Right Knee Extension (Degrees): 40  Post Interventions Patient Tolerated: Well Instructions Provided: Care of device Ortho Devices Type of Ortho Device: Bone foam zero knee Ortho Device/Splint Interventions: Ordered   Post Interventions Patient Tolerated: Well Instructions Provided: Care of device  [name] 02/19/2022, 12:45 PM

## 2022-02-19 NOTE — Plan of Care (Signed)

## 2022-02-19 NOTE — Anesthesia Procedure Notes (Signed)
Procedure Name: Intubation Date/Time: 02/19/2022 7:37 AM  Performed by: Mariea Clonts, CRNAPre-anesthesia Checklist: Patient identified, Emergency Drugs available, Suction available and Patient being monitored Patient Re-evaluated:Patient Re-evaluated prior to induction Oxygen Delivery Method: Circle System Utilized Preoxygenation: Pre-oxygenation with 100% oxygen Induction Type: IV induction Ventilation: Mask ventilation without difficulty and Oral airway inserted - appropriate to patient size Laryngoscope Size: Mac and 3 Grade View: Grade I Tube type: Oral Tube size: 7.0 mm Number of attempts: 1 Airway Equipment and Method: Stylet and Oral airway Placement Confirmation: ETT inserted through vocal cords under direct vision, positive ETCO2 and breath sounds checked- equal and bilateral Tube secured with: Tape Dental Injury: Teeth and Oropharynx as per pre-operative assessment

## 2022-02-19 NOTE — Evaluation (Signed)
Physical Therapy Evaluation Patient Details Name: Carrie Franco MRN: [id number] DOB: 03-17-1965 Today's Date: 02/19/2022  History of Present Illness  57 yo female s/p R TKR on 2/5. PMH includes cervical radiculopathy, depression, OA, HLD, GERD, HTN, cocaine use, tobacco use, bilat carpal tunnel release 2021.  Clinical Impression   Pt presents with R knee pain, impaired RLE strength post-op, impaired R knee ROM, min difficulty mobilizing, and decreased activity tolerance. Pt to benefit from acute PT to address deficits. Pt ambulated short room distance x2 overall requiring close guard to light physical assist to mobilize. Pt educated on ankle pumps (20/hour) to perform this afternoon/evening to increase circulation, to pt's tolerance and limited by pain. PT to progress mobility as tolerated, and will continue to follow acutely.           Recommendations for follow up therapy are one component of a multi-disciplinary discharge planning process, led by the attending physician.  Recommendations may be updated based on patient status, additional functional criteria and insurance authorization.  Follow Up Recommendations Follow physician's recommendations for discharge plan and follow up therapies      Assistance Recommended at Discharge Frequent or constant Supervision/Assistance  Patient can return home with the following  A little help with walking and/or transfers;A little help with bathing/dressing/bathroom    Equipment Recommendations Rolling walker (2 wheels);BSC/3in1  Recommendations for Other Services       Functional Status Assessment       Precautions / Restrictions Precautions Precautions: Fall Required Braces or Orthoses: Knee Immobilizer - Right Knee Immobilizer - Right: On when out of bed or walking;Discontinue once straight leg raise with < 10 degree lag Restrictions Weight Bearing Restrictions: Yes RLE Weight Bearing: Weight bearing as tolerated      Mobility  Bed  Mobility Overal bed mobility: Needs Assistance Bed Mobility: Supine to Sit     Supine to sit: Min assist     General bed mobility comments: assist for LE progression to EOB    Transfers Overall transfer level: Needs assistance Equipment used: Rolling walker (2 wheels) Transfers: Sit to/from Stand Sit to Stand: Min assist           General transfer comment: assist for power up and steadying upon standing, STS X2 from EOB and BSC.    Ambulation/Gait Ambulation/Gait assistance: Min guard Gait Distance (Feet): 15 Feet (x2) Assistive device: Rolling walker (2 wheels) Gait Pattern/deviations: Step-to pattern, Decreased step length - right, Antalgic Gait velocity: decr     General Gait Details: cues for sequencing, placement in RW  Stairs            Wheelchair Mobility    Modified Rankin (Stroke Patients Only)       Balance Overall balance assessment: Mild deficits observed, not formally tested                                           Pertinent Vitals/Pain Pain Assessment Pain Assessment: Faces Faces Pain Scale: Hurts little more Pain Location: R knee Pain Descriptors / Indicators: Sore, Discomfort Pain Intervention(s): Limited activity within patient's tolerance, Monitored during session, Repositioned    Home Living Family/patient expects to be discharged to:: Private residence Living Arrangements: Alone Available Help at Discharge: Family Type of Home: House Home Access: Level entry (level if entering through back door)       Home Layout: One level Home Equipment: Rolling  Walker (2 wheels)      Prior Function Prior Level of Function : Independent/Modified Independent                     Hand Dominance   Dominant Hand: Right    Extremity/Trunk Assessment   Upper Extremity Assessment Upper Extremity Assessment: Defer to OT evaluation    Lower Extremity Assessment Lower Extremity Assessment: RLE  deficits/detail RLE Deficits / Details: anticipated post-op weakness; able to perform ankle pumps, weak quad set. >10 deg quad lag with SLR. Knee AAROM 5-45 deg    Cervical / Trunk Assessment Cervical / Trunk Assessment: Normal  Communication   Communication: No difficulties  Cognition Arousal/Alertness: Awake/alert Behavior During Therapy: WFL for tasks assessed/performed Overall Cognitive Status: Within Functional Limits for tasks assessed                                          General Comments General comments (skin integrity, edema, etc.): + void in [facility], RN notified    Exercises     Assessment/Plan    PT Assessment Patient needs continued PT services  PT Problem List Decreased strength;Decreased mobility;Decreased activity tolerance;Decreased balance;Decreased range of motion;Decreased safety awareness;Decreased knowledge of use of DME;Pain;Decreased knowledge of precautions       PT Treatment Interventions DME instruction;Therapeutic activities;Gait training;Therapeutic exercise;Patient/family education;Balance training;Functional mobility training;Neuromuscular re-education    PT Goals (Current goals can be found in the Care Plan section)  Acute Rehab PT Goals Patient Stated Goal: home PT Goal Formulation: With patient Time For Goal Achievement: 02/26/22 Potential to Achieve Goals: Good    Frequency 7X/week     Co-evaluation               AM-PAC PT "6 Clicks" Mobility  Outcome Measure Help needed turning from your back to your side while in a flat bed without using bedrails?: A Little Help needed moving from lying on your back to sitting on the side of a flat bed without using bedrails?: A Little Help needed moving to and from a bed to a chair (including a wheelchair)?: A Little Help needed standing up from a chair using your arms (e.g., wheelchair or bedside chair)?: A Little Help needed to walk in hospital room?: A Little Help needed  climbing 3-5 steps with a railing? : A Lot 6 Click Score: 17    End of Session Equipment Utilized During Treatment: Right knee immobilizer Activity Tolerance: Patient tolerated treatment well Patient left: in chair;with call bell/phone within reach;with nursing/sitter in room;Other (comment) (chair alarm pad placed, no chair alarm in room, RN notified. Pt states she will press call button and wait for assist prior to mobilizign) Nurse Communication: Mobility status PT Visit Diagnosis: Other abnormalities of gait and mobility (R26.89);Pain Pain - Right/Left: Right Pain - part of body: Knee    Time: 1700-1731 (x5 minutes not included in chargeable time, pt on toilet) PT Time Calculation (min) (ACUTE ONLY): 31 min   Charges:   PT Evaluation $PT Eval Low Complexity: 1 Low PT Treatments $Therapeutic Activity: 8-22 mins      Stacie Glaze, PT DPT Acute Rehabilitation Services Pager [phone]  Office [phone]   Louis Matte 02/19/2022, 5:36 PM

## 2022-02-19 NOTE — Progress Notes (Signed)
Internal Medicine Clinic Attending  Case discussed with Dr. Simeon Craft  At the time of the visit.  We reviewed the resident's history and exam and pertinent patient test results.  I agree with the assessment, diagnosis, and plan of care documented in the resident's note.

## 2022-02-19 NOTE — Anesthesia Procedure Notes (Signed)
Anesthesia Regional Block: Adductor canal block   Pre-Anesthetic Checklist: , timeout performed,  Correct Patient, Correct Site, Correct Laterality,  Correct Procedure, Correct Position, site marked,  Risks and benefits discussed,  Surgical consent,  Pre-op evaluation,  At surgeon's request and post-op pain management  Laterality: Right  Prep: chloraprep       Needles:  Injection technique: Single-shot  Needle Type: Stimulator Needle - 80     Needle Length: 10cm  Needle Gauge: 21     Additional Needles:   Narrative:  Start time: 02/19/2022 6:46 AM End time: 02/19/2022 6:56 AM Injection made incrementally with aspirations every 5 mL.  Performed by: Personally  Anesthesiologist: Duane Boston, MD

## 2022-02-19 NOTE — Transfer of Care (Signed)
Immediate Anesthesia Transfer of Care Note  Patient: Carrie Franco  Procedure(s) Performed: RIGHT TOTAL KNEE ARTHROPLASTY (Right: Knee)  Patient Location: PACU  Anesthesia Type:GA combined with regional for post-op pain  Level of Consciousness: drowsy  Airway & Oxygen Therapy: Patient Spontanous Breathing and Patient connected to face mask oxygen  Post-op Assessment: Report given to RN and Post -op Vital signs reviewed and stable  Post vital signs: Reviewed and stable  Last Vitals:  Vitals Value Taken Time  BP 130/74 02/19/22 1115  Temp 36.4 C 02/19/22 1115  Pulse 98 02/19/22 1118  Resp 13 02/19/22 1118  SpO2 94 % 02/19/22 1118  Vitals shown include unvalidated device data.  Last Pain:  Vitals:   02/19/22 8563  TempSrc:   PainSc: 8       Patients Stated Pain Goal: 5 (14/97/02 6378)  Complications: No notable events documented.

## 2022-02-19 NOTE — Brief Op Note (Signed)
   02/19/2022  10:24 AM  PATIENT:  [name]  57 y.o. female  PRE-OPERATIVE DIAGNOSIS:  right knee osteoarthritis  POST-OPERATIVE DIAGNOSIS:  right knee osteoarthritis  PROCEDURE:  Procedure(s): RIGHT TOTAL KNEE ARTHROPLASTY  SURGEON:  Surgeon(s): Meredith Pel, MD  ASSISTANT: magnant pa  ANESTHESIA:   general  EBL: 75 ml    Total I/O In: 1150 [I.V.:1000; IV Piggyback:150] Out: -   BLOOD ADMINISTERED: none  DRAINS: none   LOCAL MEDICATIONS USED:  marcaine mso4 clonidine vanco  SPECIMEN:  No Specimen  COUNTS:  YES  TOURNIQUET:   Total Tourniquet Time Documented: Thigh (Right) - 115 minutes Total: Thigh (Right) - 115 minutes   DICTATION: .Other Dictation: Dictation Number pend  PLAN OF CARE: Admit for overnight observation  PATIENT DISPOSITION:  PACU - hemodynamically stable

## 2022-02-19 NOTE — Plan of Care (Signed)
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
## 2022-02-20 ENCOUNTER — Encounter (HOSPITAL_COMMUNITY): Payer: Self-pay | Admitting: Orthopedic Surgery

## 2022-02-20 DIAGNOSIS — I10 Essential (primary) hypertension: Secondary | ICD-10-CM | POA: Diagnosis not present

## 2022-02-20 DIAGNOSIS — M1711 Unilateral primary osteoarthritis, right knee: Secondary | ICD-10-CM | POA: Diagnosis not present

## 2022-02-20 DIAGNOSIS — J45909 Unspecified asthma, uncomplicated: Secondary | ICD-10-CM | POA: Diagnosis not present

## 2022-02-20 DIAGNOSIS — Z87891 Personal history of nicotine dependence: Secondary | ICD-10-CM | POA: Diagnosis not present

## 2022-02-20 MED ORDER — OXYCODONE HCL 5 MG PO TABS: 5.0000 mg | ORAL_TABLET | ORAL | 0 refills | Status: DC | PRN

## 2022-02-20 MED ORDER — ASPIRIN 81 MG PO CHEW: 81.0000 mg | CHEWABLE_TABLET | Freq: Two times a day (BID) | ORAL | 0 refills | Status: DC

## 2022-02-20 NOTE — Op Note (Signed)
NAMEPORTER, NAKAMA MEDICAL RECORD NO: [id number] ACCOUNT NO: [id number] DATE OF BIRTH: 11/20/1965 FACILITY: [facility] LOCATION: [facility] PHYSICIAN: Yetta Barre. Marlou Sa, MD  Operative Report   DATE OF PROCEDURE: 02/19/2022  PREOPERATIVE DIAGNOSES:  Right knee arthritis.  POSTOPERATIVE DIAGNOSES:  Right knee arthritis.  PROCEDURE:  Right total knee replacement using Persona pressfit knees cruciate retaining size 7 narrow femur with size D pressfit tibia, 35 mm x 10 mm thickness, trabecular metal patella and 10 mm medial congruent bearing.  SURGEON:  Yetta Barre. Marlou Sa, MD  ASSISTANT:  Annie Main.  INDICATIONS:  Carrie Franco is a 57 year old patient with severe end-stage right knee arthritis.  Presents for operative management after explanation of risks and benefits.  DESCRIPTION OF PROCEDURE:  The patient was brought to the operating room where general anesthetic was induced.  Preoperative antibiotics administered.  Timeout was called. The right leg was prescrubbed with alcohol and Betadine, allowed to air dry,  prepped with DuraPrep solution and draped in a sterile manner.  After calling a timeout, Carrie Franco was used to cover the operative field.  Leg was elevated and exsanguinated with the Esmarch wrap.  Tourniquet was inflated.  The patient had about 10 degree  preoperative flexion contracture.  Slight varus deformity.  Anterior approach to the knee was made.  Skin and subcutaneous tissues were sharply divided.  IrriSept solution utilized. Median patellar arthrotomy was made and marked with a #1 Vicryl suture.   Fat pad was partially excised.  Lateral patellofemoral ligament was released.  Medial soft tissue dissection was performed proportional to the patient's preoperative varus deformity, which was mild.  ACL released.  Anterior horn lateral meniscus  released.  Soft tissue from the anterior distal femur, also released, also removed for visualization.  Next, with the neurovascular structures protected  and the collaterals protected, an intramedullary alignment was used to make a cut off of the distal  tibia measuring 10 mm off the least affected lateral tibial plateau.  This cut was measured and it was actually about 9 mm.  A 2 mm more resection was later performed.  This allowed symmetric flexion and extension gaps.  Partial stretching of the MCL was  present, which was later repaired, The distal femur was then cut in 5 degrees of valgus using intramedullary alignment, 10 mm off the distal surface.  Bone quality was excellent.  Anterior, posterior and chamfer cuts were then made in 3 degrees of  external rotation to a size 7 narrow femur.  Tibial baseplate was placed in rotational alignment, in line with the medial third of the tibial tubercle.  The MCL had some sutures placed in order to reestablish its tension and this allowed for better  assessment of stability.  Patella was then cut down from 23 to 13 mm and a patellar trial was placed.  With these components in position, the patient had full extension, full flexion with good patellar tracking using no thumbs technique and good  stability to varus and valgus stress.  Next, the trial components were removed after completing the preparation on both the femur and the tibia.  Thorough irrigation was performed with 3 liters of pulsatile irrigation followed by tranexamic acid sponge  and IrriSept solution for 3 minutes.  Capsule was also anesthetized using a combination of Marcaine, saline and Exparel. True components placed with excellent press fit obtained on all surfaces.  The sutures placed in the stretched MCL were then  tightened in extension.  This gave very good stability to the  construction in full extension as well as 30 degrees of flexion.  The patella was also placed.  Tourniquet was released at this time. Thorough irrigation performed with 3 liters of pouring  irrigation.  Arthrotomy was then closed over a bolster using #1 Vicryl suture.  Prior  to final arthrotomy closure, knee joint was again irrigated with IrriSept solution. Vancomycin powder placed.  We also placed vancomycin powder in the canal of the  tibia.  The arthrotomy closure was then completed.  Next, the skin was then closed using interrupted inverted 0 Vicryl suture followed by 2-0 Vicryl suture, and 3-0 Monocryl with Steri-Strips and Aquacel dressing applied.  It should also be noted that we  placed a solution of Marcaine, morphine, clonidine into the knee joint prior to final closure for added pain relief.  The patient was placed in a bulky wrap, iceman and knee immobilizer and was transferred to the recovery room in stable condition.   Luke's assistance was required at all times for retraction, opening, closing, mobilization of the knee.  His assistance was a medical necessity.   MUK D: 02/20/2022 7:32:56 am T: 02/20/2022 8:22:00 am  JOB: [id number]/ [id number]

## 2022-02-20 NOTE — Progress Notes (Signed)
Physical Therapy Treatment Patient Details Name: Carrie Franco MRN: [id number] DOB: 1965-07-29 Today's Date: 02/20/2022   History of Present Illness 57 yo female s/p R TKR on 2/5. PMH includes cervical radiculopathy, depression, OA, HLD, GERD, HTN, cocaine use, tobacco use, bilat carpal tunnel release 2021.    PT Comments    Pt received in supine with daughter present and pt agreeable to therapy. Pt able to complete supine to sit with HOB flat and sup with cues for sequence. Pt required HOB elevated and min A to raise RLE into bed for sit to supine secondary to increased pain after gait. Pt was able to demo all HEP exercises with cues for technique and increased gait distance this session. Pt continues to be limited by pain, however reported feeling comfortable with mobility at home with daughter's assistance. Due to improved mobility, pt does not feel she needs a second session later today. Anticipate safe discharge home with regard to functional mobility needs once medically cleared.    Recommendations for follow up therapy are one component of a multi-disciplinary discharge planning process, led by the attending physician.  Recommendations may be updated based on patient status, additional functional criteria and insurance authorization.  Follow Up Recommendations  Follow physician's recommendations for discharge plan and follow up therapies     Assistance Recommended at Discharge Frequent or constant Supervision/Assistance  Patient can return home with the following A little help with walking and/or transfers;A little help with bathing/dressing/bathroom   Equipment Recommendations  Rolling walker (2 wheels);BSC/3in1    Recommendations for Other Services       Precautions / Restrictions Precautions Precautions: Fall Restrictions Weight Bearing Restrictions: Yes RLE Weight Bearing: Weight bearing as tolerated     Mobility  Bed Mobility Overal bed mobility: Needs Assistance Bed  Mobility: Supine to Sit     Supine to sit: Supervision Sit to supine: Min assist   General bed mobility comments: assist for RLE elevation to bed secondary to increased pain after gait session    Transfers Overall transfer level: Needs assistance Equipment used: Rolling walker (2 wheels) Transfers: Sit to/from Stand Sit to Stand: Min guard           General transfer comment: cues for safe hand placement    Ambulation/Gait Ambulation/Gait assistance: Min guard, Supervision Gait Distance (Feet): 125 Feet Assistive device: Rolling walker (2 wheels) Gait Pattern/deviations: Step-to pattern, Decreased step length - right, Antalgic, Step-through pattern, Trunk flexed Gait velocity: decr     General Gait Details: cues for sequencing, placement in RW and to not pick up the RW. Pt initially min guard due to pain and slight unsteadiness progressing to supervision.       Balance Overall balance assessment: Needs assistance Sitting-balance support: No upper extremity supported Sitting balance-Leahy Scale: Good Sitting balance - Comments: on EOB   Standing balance support: Bilateral upper extremity supported, Reliant on assistive device for balance Standing balance-Leahy Scale: Fair Standing balance comment: with RW support                            Cognition Arousal/Alertness: Awake/alert Behavior During Therapy: WFL for tasks assessed/performed Overall Cognitive Status: Within Functional Limits for tasks assessed                                          Exercises Total Joint Exercises  Ankle Circles/Pumps: AROM, 10 reps, Both Quad Sets: AROM, Both, 5 reps Short Arc Quad: AROM, Right, 5 reps Heel Slides: AAROM, Right, 5 reps Hip ABduction/ADduction: AROM, 5 reps, Right Straight Leg Raises: AROM, Right, 5 reps Long Arc Quad: AROM, Right, 5 reps Knee Flexion: AROM, Right, 5 reps R knee flexion ROM 7 to 52*   General Comments         Pertinent Vitals/Pain Pain Assessment Pain Assessment: 0-10 Pain Score: 8  Pain Location: R knee Pain Descriptors / Indicators: Sore, Discomfort, Grimacing, Guarding Pain Intervention(s): Limited activity within patient's tolerance, Monitored during session, Ice applied, Repositioned     PT Goals (current goals can now be found in the care plan section) Acute Rehab PT Goals Patient Stated Goal: home PT Goal Formulation: With patient Time For Goal Achievement: 02/26/22 Potential to Achieve Goals: Good Progress towards PT goals: Progressing toward goals    Frequency    7X/week      PT Plan Current plan remains appropriate       AM-PAC PT "6 Clicks" Mobility   Outcome Measure  Help needed turning from your back to your side while in a flat bed without using bedrails?: A Little Help needed moving from lying on your back to sitting on the side of a flat bed without using bedrails?: A Little Help needed moving to and from a bed to a chair (including a wheelchair)?: A Little Help needed standing up from a chair using your arms (e.g., wheelchair or bedside chair)?: A Little Help needed to walk in hospital room?: A Little Help needed climbing 3-5 steps with a railing? : A Lot 6 Click Score: 17    End of Session Equipment Utilized During Treatment: Gait belt Activity Tolerance: Patient tolerated treatment well Patient left: in bed;with bed alarm set;with family/visitor present Nurse Communication: Mobility status PT Visit Diagnosis: Other abnormalities of gait and mobility (R26.89);Pain Pain - Right/Left: Right Pain - part of body: Knee     Time: 1308-6578 PT Time Calculation (min) (ACUTE ONLY): 55 min  Charges:  $Gait Training: 23-37 mins $Therapeutic Exercise: 8-22 mins $Therapeutic Activity: 8-22 mins                     Michelle Nasuti, PTA Acute Rehabilitation Services Secure Chat Preferred  [phone]    Michelle Nasuti 02/20/2022, 10:59 AM

## 2022-02-20 NOTE — Progress Notes (Signed)
CSW met with pt regarding SDOH: food and transportation. Pt reports that she gets $146 per month in food stamps but that this does run out before the end of the month sometimes.  Discussed local food pantries, pt has not accessed these in the past but agreeable to receiving information about options.  Resource list provided. Pt reports that her daughter does assist with transportation to medical appointments but also works full time and is not always available.  Pt does have medicaid, discussed medicaid transportation to medical appts and pt is interested in this.  Resource list provided with contact information.   [employer] also provided. Lurline Idol, MSW, LCSW 2/6/20242:25 PM

## 2022-02-20 NOTE — Progress Notes (Signed)
  Subjective: Carrie Franco is a 57 y.o. female s/p right TKA.  They are POD1.  Pt's pain is controlled.  Pt denies any complain of chest pain, shortness of breath, abdominal pain, calf pain.  Patient denies any fevers or chills. Was able to ambulate well with PT today, walking >100 feet. No stairs to get into her home  Objective: Vital signs in last 24 hours: Temp:  [98.2 F (36.8 C)-98.4 F (36.9 C)] 98.4 F (36.9 C) (02/06 0758) Pulse Rate:  [85-94] 85 (02/06 0758) Resp:  [17-18] 17 (02/06 0758) BP: (99-106)/(54-73) 106/66 (02/06 0758) SpO2:  [92 %-100 %] 100 % (02/06 0758)  Intake/Output from previous day: 02/05 0701 - 02/06 0700 In: 2756.8 [I.V.:2135.6; IV Piggyback:621.2] Out: 4801 [Urine:1600; Blood:150] Intake/Output this shift: No intake/output data recorded.  Exam:  No gross blood or drainage overlying the dressing 2+ DP pulse Sensation intact distally in the operative foot Able to dorsiflex and plantarflex the operative foot No calf tenderness.  Negative Homans' sign. Able to perform straight leg raise   Labs: No results for input(s): "HGB" in the last 72 hours. No results for input(s): "WBC", "RBC", "HCT", "PLT" in the last 72 hours. No results for input(s): "NA", "K", "CL", "CO2", "BUN", "CREATININE", "GLUCOSE", "CALCIUM" in the last 72 hours. No results for input(s): "LABPT", "INR" in the last 72 hours.  Assessment/Plan: Pt is POD1 s/p TKA.    -Plan to discharge to home today  -WBAT with a walker  -Follow-up with Dr. Marlou Sa in clinic 2 weeks postoperatively    [facility] 02/20/2022, 8:22 PM

## 2022-02-20 NOTE — TOC Initial Note (Signed)
Transition of Care [facility]) - Initial/Assessment Note    Patient Details  Name: Carrie Franco MRN: [id number] Date of Birth: 20-Aug-1965  Transition of Care [facility] CM/SW Contact:    Sharin Mons, RN Phone Number: 02/20/2022, 10:40 AM  Clinical Narrative:                  - s/p Right total knee replacement, 2/5   From home with daughter. PTA independent with ADL's, no DME usage. Pt states daughter will assist with care once d/c .  Provider's office prearranged home health services before procedure. Order noted for DME: RW and BSC. Referral made with [facility]. Equipment will be delivered to bedside prior to d/c. Pt states already has CPM @ home. Pt without RX med concerns. Daughter to provide transportation to home once d/c ready.  TOC team following and will assist with needs...  Expected Discharge Plan: [place] Barriers to Discharge: Continued Medical Work up   Patient Goals and CMS Choice     Choice offered to / list presented to : Patient      Expected Discharge Plan and Services                 DME Arranged: Bedside commode, Walker rolling DME Agency: [facility] Date DME Agency Contacted: 02/20/22 Time DME Agency Contacted: 0800 Representative spoke with at DME Agency: Erasmo Downer( text) [place] Arranged: PT HH Agency: [place] Date [place]: 02/20/22 Time HH Agency Contacted: 0800    Prior Living Arrangements/Services                       Activities of Daily Living Home Assistive Devices/Equipment: Dentures (specify type) ADL Screening (condition at time of admission) Patient's cognitive ability adequate to safely complete daily activities?: Yes Is the patient deaf or have difficulty hearing?: No Does the patient have difficulty seeing, even when wearing glasses/contacts?: No Does the patient have difficulty concentrating, remembering, or making decisions?: No Patient able to express need for assistance with  ADLs?: Yes Does the patient have difficulty dressing or bathing?: No Independently performs ADLs?: Yes (appropriate for developmental age) Does the patient have difficulty walking or climbing stairs?: Yes Weakness of Legs: Right Weakness of Arms/Hands: None  Permission Sought/Granted                  Emotional Assessment              Admission diagnosis:  S/P total knee replacement, right [Z96.651] Patient Active Problem List   Diagnosis Date Noted   S/P total knee replacement, 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   Rhomboid muscle strain 02/14/2022   Preoperative clearance 02/01/2022   Thrombocytopenia ([place]) 02/01/2022   Epistaxis 11/15/2021   Thyroid nodule 12/01/2020   Headache 10/02/2020   Cervical radiculopathy 08/12/2020   Aortic atherosclerosis ([place]) 02/17/2020   Emphysema of lung ([place]) 02/17/2020   Neuropathic pain 11/26/2019   Strain of thoracic paraspinal muscles excluding T1 and T2 levels 04/18/2018   Onychomycosis 12/04/2017   Radiculopathy of lumbosacral region 06/24/2017   Depression 01/02/2017   Osteoarthritis 12/05/2015   Trigeminal neuralgia of right side of face 11/09/2015   Eczema 04/01/2015   Chronic neck pain 03/08/2015   History of colonic polyps 09/07/2014   Normocytic anemia 09/07/2014   Hyperlipidemia 07/31/2013   GERD (gastroesophageal reflux disease) 03/19/2013   Cerumen impaction 08/07/2012   Obesity (BMI 30.0-34.9) 08/05/2012   Right thigh pain 07/17/2012   Vaginal discharge 06/22/2011  Persistent asthma 02/23/2010   Healthcare maintenance 02/23/2010   Essential hypertension 01/13/2007   History of herpes genitalis 01/02/2006   Allergic sinusitis 01/02/2006   PCP:  [name], MD Pharmacy:   [place], [place] - [place] [address] Phone: [phone] Fax: [phone]     Social Determinants of Health (SDOH) Social History: [place]:  Food Insecurity Present (02/01/2022)  Housing: Medium Risk (02/01/2022)  Transportation Needs: Unmet Transportation Needs (02/01/2022)  Utilities: Not At Risk (02/01/2022)  Alcohol Screen: Low Risk  (12/14/2021)  Depression (PHQ2-9): High Risk (02/14/2022)  Financial Resource Strain: High Risk (02/01/2022)  Physical Activity: Sufficiently Active (12/14/2021)  Social Connections: Unknown (02/01/2022)  Recent Concern: Social Connections - Moderately Isolated (12/14/2021)  Stress: Stress Concern Present (12/14/2021)  Tobacco Use: Medium Risk (02/19/2022)   SDOH Interventions:     Readmission Risk Interventions     No data to display

## 2022-02-20 NOTE — Care Management Obs Status (Signed)
[place] NOTIFICATION   Patient Details  Name: Carrie Franco MRN: [id number] Date of Birth: 1965-07-03   Medicare Observation Status Notification Given:  Yes    Sharin Mons, RN 02/20/2022, 10:36 AM

## 2022-02-20 NOTE — Progress Notes (Signed)
Orthopedic Tech Progress Note Patient Details:  Carrie Franco 01-13-1966 [id number]  Called in order to [employer] for a BLEDSOE BRACE.  Patient ID: Carrie Franco, female   DOB: 10-29-1965, 57 y.o.   MRN: [id number]  [name] 02/20/2022, 12:16 PM

## 2022-02-21 ENCOUNTER — Other Ambulatory Visit: Payer: Self-pay | Admitting: Student

## 2022-02-21 ENCOUNTER — Telehealth: Payer: Self-pay

## 2022-02-21 DIAGNOSIS — Z471 Aftercare following joint replacement surgery: Secondary | ICD-10-CM | POA: Diagnosis not present

## 2022-02-21 DIAGNOSIS — Z7982 Long term (current) use of aspirin: Secondary | ICD-10-CM | POA: Diagnosis not present

## 2022-02-21 DIAGNOSIS — J439 Emphysema, unspecified: Secondary | ICD-10-CM

## 2022-02-21 DIAGNOSIS — M5412 Radiculopathy, cervical region: Secondary | ICD-10-CM

## 2022-02-21 DIAGNOSIS — Z8673 Personal history of transient ischemic attack (TIA), and cerebral infarction without residual deficits: Secondary | ICD-10-CM | POA: Diagnosis not present

## 2022-02-21 DIAGNOSIS — K219 Gastro-esophageal reflux disease without esophagitis: Secondary | ICD-10-CM | POA: Diagnosis not present

## 2022-02-21 DIAGNOSIS — I700 Atherosclerosis of aorta: Secondary | ICD-10-CM | POA: Diagnosis not present

## 2022-02-21 DIAGNOSIS — I10 Essential (primary) hypertension: Secondary | ICD-10-CM | POA: Diagnosis not present

## 2022-02-21 DIAGNOSIS — D696 Thrombocytopenia, unspecified: Secondary | ICD-10-CM | POA: Diagnosis not present

## 2022-02-21 DIAGNOSIS — J45909 Unspecified asthma, uncomplicated: Secondary | ICD-10-CM | POA: Diagnosis not present

## 2022-02-21 DIAGNOSIS — M5417 Radiculopathy, lumbosacral region: Secondary | ICD-10-CM

## 2022-02-21 DIAGNOSIS — Z96651 Presence of right artificial knee joint: Secondary | ICD-10-CM

## 2022-02-21 DIAGNOSIS — Z9181 History of falling: Secondary | ICD-10-CM | POA: Diagnosis not present

## 2022-02-21 DIAGNOSIS — Z86010 Personal history of colonic polyps: Secondary | ICD-10-CM | POA: Diagnosis not present

## 2022-02-21 DIAGNOSIS — Z87891 Personal history of nicotine dependence: Secondary | ICD-10-CM | POA: Diagnosis not present

## 2022-02-21 DIAGNOSIS — D649 Anemia, unspecified: Secondary | ICD-10-CM | POA: Diagnosis not present

## 2022-02-21 DIAGNOSIS — E669 Obesity, unspecified: Secondary | ICD-10-CM | POA: Diagnosis not present

## 2022-02-21 DIAGNOSIS — F32A Depression, unspecified: Secondary | ICD-10-CM | POA: Diagnosis not present

## 2022-02-21 DIAGNOSIS — E785 Hyperlipidemia, unspecified: Secondary | ICD-10-CM | POA: Diagnosis not present

## 2022-02-21 DIAGNOSIS — F331 Major depressive disorder, recurrent, moderate: Secondary | ICD-10-CM

## 2022-02-21 DIAGNOSIS — M1990 Unspecified osteoarthritis, unspecified site: Secondary | ICD-10-CM | POA: Diagnosis not present

## 2022-02-21 DIAGNOSIS — I872 Venous insufficiency (chronic) (peripheral): Secondary | ICD-10-CM | POA: Diagnosis not present

## 2022-02-21 DIAGNOSIS — M1711 Unilateral primary osteoarthritis, right knee: Secondary | ICD-10-CM

## 2022-02-21 DIAGNOSIS — Z6838 Body mass index (BMI) 38.0-38.9, adult: Secondary | ICD-10-CM | POA: Diagnosis not present

## 2022-02-21 NOTE — Progress Notes (Signed)
Internal Medicine Clinic Attending  Case discussed with Dr. Jodi Mourning  At the time of the visit.  We reviewed the resident's history and exam and pertinent patient test results.  I agree with the assessment, diagnosis, and plan of care documented in the resident's note.

## 2022-02-21 NOTE — Telephone Encounter (Signed)
Received a call from [place] with [facility] regarding patients with 2 questions. Patients therapist reports patient O2 is 85 at rest with no symptoms--wanting to know if referral needs to be placed or have patient follow up with PCP?  Also patient is in a bledsoe brace per report of therapist.  Should patient continue with this? Patient told therapist was placed on her at time of d/c from hospital.  Are there restrictions that they should be aware of? Please advise or reach out to [place] with [facility] at [phone]

## 2022-02-21 NOTE — Telephone Encounter (Signed)
I called patient this evening.  I advised her that if she develops any symptomatic shortness of breath or chest pain/calf pain that she should go immediately to the emergency department.  She was able to speak in full sentences without running out of breath.  she agreed with this plan.  Currently she feels at baseline.  She had oxygen saturation in the hospital ranging from 92% to 100% with the majority of the time around 96 to 97%.  Did use her inhaler today.  She is on CPM machine when I was talking to her.  She was told by her therapist that she does not have to use Bledsoe brace but I reinforced that she should use this Bledsoe brace anytime she is up and around or doing any exercises.  The only time she should remove it is for showering and sleeping.  She agreed with this plan.  She will call with any other concerns in the meantime.  Could you let [place] know about this Bledsoe brace issue for this particular patient?  She just needs to stay in this pretty much all the time.

## 2022-02-22 ENCOUNTER — Other Ambulatory Visit: Payer: Medicare Other

## 2022-02-22 NOTE — Patient Outreach (Signed)
Medicaid Managed Care Social Work Note  02/22/2022 Name:  Carrie Franco MRN:  [id number] DOB:  [birth year]  Carrie Franco is an 57 y.o. year old female who is a primary Carrie Franco of [name], MD.  The [place] team was consulted for assistance with:  Community Resources   Carrie Franco was given information about Medicaid Managed Care Coordination team services today. Shericka Domingo Pulse Carrie Franco agreed to services and verbal consent obtained.  Engaged with Carrie Franco  for by telephone forinitial visit in response to referral for case management and/or care coordination services.   Assessments/Interventions:  Review of past medical history, allergies, medications, health status, including review of consultants reports, laboratory and other test data, was performed as part of comprehensive evaluation and provision of chronic care management services.  SDOH: (Social Determinant of Health) assessments and interventions performed: SDOH Interventions    Flowsheet Row Admission (Discharged) from 02/19/2022 in [facility] Office Visit from 02/14/2022 in [facility] Office Visit from 02/01/2022 in [facility] Telephone from 12/19/2021 in [facility] Office Visit from 12/14/2021 in [facility] Office Visit from 11/15/2021 in [facility]  SDOH Interventions        Food Insecurity Interventions Inpatient TOC, Other (Comment)  [food pantry resource list provided] -- -- -- Intervention Not Indicated --  Housing Interventions -- -- -- -- Intervention Not Indicated --  Transportation Interventions Inpatient TOC, Other (Comment)  Science writer provided] -- -- Other (Comment)  [Provided with [employer] medical [phone]] Intervention Not Indicated --  Utilities Interventions -- -- Intervention Not  Indicated -- Intervention Not Indicated --  Alcohol Usage Interventions -- -- -- -- Intervention Not Indicated (Score <7) --  Depression Interventions/Treatment  -- --  [Provider made aware] --  [Provider made aware] -- -- --  [Provider made aware]  Financial Strain Interventions -- -- -- -- Intervention Not Indicated --  Physical Activity Interventions -- -- -- -- Intervention Not Indicated --  Stress Interventions -- -- -- -- Intervention Not Indicated --  Social Connections Interventions -- -- -- -- Intervention Not Indicated --     BSW completed a telephone outreach with Carrie Franco. She recently had knee surgery and is unable to go anywhere for 2-3 weeks. Carrie Franco states she has her daughter to help her when she can. Carrie Franco does receive 146 in foodstamps each month, BSW and Carrie Franco discussed food pantries, Carrie Franco stated she has no way of getting there right now. Carrie Franco stated she thinks she has state medicaid and [employer]. Carrie Franco receives 989 in disability and rent is 850, BSW will send Carrie Franco rent and utility resources. BSW will send a message to patients PCP about [name].  Advanced Directives Status:  Not addressed in this encounter.  Care Plan                 Allergies  Allergen Reactions   Orange Fruit [Citrus] Hives    Pt describes reaction as big bumps   Orange Oil Hives    Pt describes reaction as big bumps   Other     tomatoes   Clindamycin/Lincomycin Hives and Rash   Meloxicam Itching and Rash   Penicillins Hives, Rash and Other (See Comments)    Medications Reviewed Today     Reviewed by Horatio Pel, RN (Registered Nurse) on 02/19/22 at [phone]  Med List Status: Complete  Medication Order Taking? Sig Documenting Provider Last Dose Status Informant  albuterol (VENTOLIN HFA) 108 (90 Base) MCG/ACT inhaler [id number] Yes Inhale 1 puff into the lungs daily as needed. Iona Beard, MD  Active Self  amLODipine (NORVASC) 10 MG tablet [id number] Yes Take 1 tablet (10 mg total) by  mouth daily. [name], MD 02/19/2022 Active Self  cetirizine (ZYRTEC) 10 MG tablet [id number] Yes Take 1 tablet by mouth once daily Idamae Schuller, MD 02/18/2022 Active Self  cyclobenzaprine (FLEXERIL) 5 MG tablet [id number] Yes Take 1 tablet (5 mg total) by mouth 3 (three) times daily as needed for muscle spasms. Izora Ribas, MD 02/18/2022 Active Self  diclofenac Sodium (VOLTAREN) 1 % GEL [id number] Yes Apply 2 g topically 4 (four) times daily.  Carrie Franco taking differently: Apply 2 g topically 4 (four) times daily as needed (pain).   Izora Ribas, MD Past Week Active Self  FLUoxetine (PROZAC) 40 MG capsule [id number] Yes Take 1 capsule by mouth once daily Idamae Schuller, MD 02/19/2022 Active Self  fluticasone (FLONASE) 50 MCG/ACT nasal spray [id number] Yes Place 2 sprays into both nostrils daily as needed for allergies. [provider] 02/18/2022 Active Self  hydrochlorothiazide (MICROZIDE) 12.5 MG capsule [id number] Yes Take 1 capsule (12.5 mg total) by mouth daily. [name], MD 02/19/2022 Active Self  ibuprofen (ADVIL) 800 MG tablet [id number] Yes Take 1 tablet (800 mg total) by mouth every 6 (six) hours as needed for fever or moderate pain. Serita Butcher, MD Past Week Active   Lido-Capsaicin-Men-Methyl Sal (1ST MEDX-PATCH/ LIDOCAINE) 4-0.025-5-20 % PTCH [id number] Yes Apply 1 patch topically daily.  Carrie Franco taking differently: Place 1 patch onto the skin daily as needed.   Idamae Schuller, MD Past Week Active Self  olmesartan (BENICAR) 40 MG tablet [id number] Yes Take 1 tablet (40 mg total) by mouth daily. [name], MD 02/19/2022 Active Self  oxyCODONE-acetaminophen (PERCOCET/ROXICET) 5-325 MG tablet [id number] Yes Take 1-2 tablets by mouth 2 (two) times daily as needed for moderate pain. No More than 4 tablets a day. Izora Ribas, MD 02/18/2022 Active   pantoprazole (PROTONIX) 40 MG tablet [id number] Yes Take 1 tablet (40 mg total) by mouth daily.  [name], MD 02/19/2022 Active Self  pregabalin (LYRICA) 100 MG capsule [id number] No Take 1 capsule (100 mg total) by mouth 2 (two) times daily.  Carrie Franco not taking: Reported on 02/12/2022   Lucious Groves, DO Not Taking Active Self  triamcinolone ointment (KENALOG) 0.1 % [id number] Yes Apply topically 2 (two) times daily as needed. Izora Ribas, MD Past Week Active Self  valACYclovir (VALTREX) 500 MG tablet [id number] Yes Take 1 tablet (500 mg total) by mouth daily.  Carrie Franco taking differently: Take 500 mg by mouth daily as needed (outbreaks).   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   Rhomboid muscle strain 02/14/2022   Preoperative clearance 02/01/2022   Thrombocytopenia ([place]) 02/01/2022   Epistaxis 11/15/2021   Thyroid nodule 12/01/2020   Headache 10/02/2020   Cervical radiculopathy 08/12/2020   Aortic atherosclerosis ([place]) 02/17/2020   Emphysema of lung ([place]) 02/17/2020   Neuropathic pain 11/26/2019   Strain of thoracic paraspinal muscles excluding T1 and T2 levels 04/18/2018   Onychomycosis 12/04/2017   Radiculopathy of lumbosacral region 06/24/2017   Depression 01/02/2017   Osteoarthritis 12/05/2015   Trigeminal neuralgia of right side of face  11/09/2015   Eczema 04/01/2015   Chronic neck pain 03/08/2015   History of colonic polyps 09/07/2014   Normocytic anemia 09/07/2014   Hyperlipidemia 07/31/2013   GERD (gastroesophageal reflux disease) 03/19/2013   Cerumen impaction 08/07/2012   Obesity (BMI 30.0-34.9) 08/05/2012   Right thigh pain 07/17/2012   Vaginal discharge 06/22/2011   Persistent asthma 02/23/2010   Healthcare maintenance 02/23/2010   Essential hypertension 01/13/2007   History of herpes genitalis 01/02/2006   Allergic sinusitis 01/02/2006    Conditions to be addressed/monitored per PCP order:   community resources  There are no  care plans that you recently modified to display for this Carrie Franco.   Follow up:  Carrie Franco agrees to Care Plan and Follow-up.  Plan: The Managed Medicaid care management team will reach out to the Carrie Franco again over the next 10 days.  Date/time of next scheduled Social Work care management/care coordination outreach:  03/08/22  Mickel Fuchs, Arita Miss, [place] Medicaid Team  [phone]

## 2022-02-22 NOTE — Patient Instructions (Signed)
Visit Information  The Patient                                              was given information about Medicaid Managed Care team care coordination services and consented to engagement with the [facility] Managed Care team.   Social Worker will follow up on 03/08/22.   Mickel Fuchs, BSW, [place] Managed Medicaid Team  [phone]

## 2022-02-22 NOTE — Telephone Encounter (Signed)
Notified Sonia Side

## 2022-02-23 ENCOUNTER — Other Ambulatory Visit: Payer: Self-pay | Admitting: Student

## 2022-02-23 DIAGNOSIS — M1990 Unspecified osteoarthritis, unspecified site: Secondary | ICD-10-CM | POA: Diagnosis not present

## 2022-02-23 DIAGNOSIS — J439 Emphysema, unspecified: Secondary | ICD-10-CM | POA: Diagnosis not present

## 2022-02-23 DIAGNOSIS — I872 Venous insufficiency (chronic) (peripheral): Secondary | ICD-10-CM | POA: Diagnosis not present

## 2022-02-23 DIAGNOSIS — M5417 Radiculopathy, lumbosacral region: Secondary | ICD-10-CM | POA: Diagnosis not present

## 2022-02-23 DIAGNOSIS — Z471 Aftercare following joint replacement surgery: Secondary | ICD-10-CM | POA: Diagnosis not present

## 2022-02-23 DIAGNOSIS — D649 Anemia, unspecified: Secondary | ICD-10-CM | POA: Diagnosis not present

## 2022-02-23 DIAGNOSIS — E785 Hyperlipidemia, unspecified: Secondary | ICD-10-CM | POA: Diagnosis not present

## 2022-02-23 DIAGNOSIS — Z86010 Personal history of colonic polyps: Secondary | ICD-10-CM | POA: Diagnosis not present

## 2022-02-23 DIAGNOSIS — Z87891 Personal history of nicotine dependence: Secondary | ICD-10-CM | POA: Diagnosis not present

## 2022-02-23 DIAGNOSIS — K219 Gastro-esophageal reflux disease without esophagitis: Secondary | ICD-10-CM | POA: Diagnosis not present

## 2022-02-23 DIAGNOSIS — Z8673 Personal history of transient ischemic attack (TIA), and cerebral infarction without residual deficits: Secondary | ICD-10-CM | POA: Diagnosis not present

## 2022-02-23 DIAGNOSIS — Z7982 Long term (current) use of aspirin: Secondary | ICD-10-CM | POA: Diagnosis not present

## 2022-02-23 DIAGNOSIS — F32A Depression, unspecified: Secondary | ICD-10-CM | POA: Diagnosis not present

## 2022-02-23 DIAGNOSIS — D696 Thrombocytopenia, unspecified: Secondary | ICD-10-CM | POA: Diagnosis not present

## 2022-02-23 DIAGNOSIS — Z96651 Presence of right artificial knee joint: Secondary | ICD-10-CM | POA: Diagnosis not present

## 2022-02-23 DIAGNOSIS — I700 Atherosclerosis of aorta: Secondary | ICD-10-CM | POA: Diagnosis not present

## 2022-02-23 DIAGNOSIS — J45909 Unspecified asthma, uncomplicated: Secondary | ICD-10-CM | POA: Diagnosis not present

## 2022-02-23 DIAGNOSIS — E669 Obesity, unspecified: Secondary | ICD-10-CM | POA: Diagnosis not present

## 2022-02-23 DIAGNOSIS — I10 Essential (primary) hypertension: Secondary | ICD-10-CM | POA: Diagnosis not present

## 2022-02-23 DIAGNOSIS — Z9181 History of falling: Secondary | ICD-10-CM | POA: Diagnosis not present

## 2022-02-23 DIAGNOSIS — M5412 Radiculopathy, cervical region: Secondary | ICD-10-CM | POA: Diagnosis not present

## 2022-02-23 DIAGNOSIS — Z6838 Body mass index (BMI) 38.0-38.9, adult: Secondary | ICD-10-CM | POA: Diagnosis not present

## 2022-02-24 DIAGNOSIS — F32A Depression, unspecified: Secondary | ICD-10-CM | POA: Diagnosis not present

## 2022-02-24 DIAGNOSIS — Z86010 Personal history of colonic polyps: Secondary | ICD-10-CM | POA: Diagnosis not present

## 2022-02-24 DIAGNOSIS — M5412 Radiculopathy, cervical region: Secondary | ICD-10-CM | POA: Diagnosis not present

## 2022-02-24 DIAGNOSIS — I872 Venous insufficiency (chronic) (peripheral): Secondary | ICD-10-CM | POA: Diagnosis not present

## 2022-02-24 DIAGNOSIS — I700 Atherosclerosis of aorta: Secondary | ICD-10-CM | POA: Diagnosis not present

## 2022-02-24 DIAGNOSIS — I10 Essential (primary) hypertension: Secondary | ICD-10-CM | POA: Diagnosis not present

## 2022-02-24 DIAGNOSIS — Z8673 Personal history of transient ischemic attack (TIA), and cerebral infarction without residual deficits: Secondary | ICD-10-CM | POA: Diagnosis not present

## 2022-02-24 DIAGNOSIS — D649 Anemia, unspecified: Secondary | ICD-10-CM | POA: Diagnosis not present

## 2022-02-24 DIAGNOSIS — Z96651 Presence of right artificial knee joint: Secondary | ICD-10-CM | POA: Diagnosis not present

## 2022-02-24 DIAGNOSIS — Z7982 Long term (current) use of aspirin: Secondary | ICD-10-CM | POA: Diagnosis not present

## 2022-02-24 DIAGNOSIS — K219 Gastro-esophageal reflux disease without esophagitis: Secondary | ICD-10-CM | POA: Diagnosis not present

## 2022-02-24 DIAGNOSIS — M1990 Unspecified osteoarthritis, unspecified site: Secondary | ICD-10-CM | POA: Diagnosis not present

## 2022-02-24 DIAGNOSIS — E785 Hyperlipidemia, unspecified: Secondary | ICD-10-CM | POA: Diagnosis not present

## 2022-02-24 DIAGNOSIS — E669 Obesity, unspecified: Secondary | ICD-10-CM | POA: Diagnosis not present

## 2022-02-24 DIAGNOSIS — M5417 Radiculopathy, lumbosacral region: Secondary | ICD-10-CM | POA: Diagnosis not present

## 2022-02-24 DIAGNOSIS — Z471 Aftercare following joint replacement surgery: Secondary | ICD-10-CM | POA: Diagnosis not present

## 2022-02-24 DIAGNOSIS — J439 Emphysema, unspecified: Secondary | ICD-10-CM | POA: Diagnosis not present

## 2022-02-24 DIAGNOSIS — Z87891 Personal history of nicotine dependence: Secondary | ICD-10-CM | POA: Diagnosis not present

## 2022-02-24 DIAGNOSIS — Z6838 Body mass index (BMI) 38.0-38.9, adult: Secondary | ICD-10-CM | POA: Diagnosis not present

## 2022-02-24 DIAGNOSIS — Z9181 History of falling: Secondary | ICD-10-CM | POA: Diagnosis not present

## 2022-02-24 DIAGNOSIS — D696 Thrombocytopenia, unspecified: Secondary | ICD-10-CM | POA: Diagnosis not present

## 2022-02-24 DIAGNOSIS — J45909 Unspecified asthma, uncomplicated: Secondary | ICD-10-CM | POA: Diagnosis not present

## 2022-02-26 ENCOUNTER — Telehealth: Payer: Self-pay

## 2022-02-26 DIAGNOSIS — M5417 Radiculopathy, lumbosacral region: Secondary | ICD-10-CM | POA: Diagnosis not present

## 2022-02-26 DIAGNOSIS — Z8673 Personal history of transient ischemic attack (TIA), and cerebral infarction without residual deficits: Secondary | ICD-10-CM | POA: Diagnosis not present

## 2022-02-26 DIAGNOSIS — D649 Anemia, unspecified: Secondary | ICD-10-CM | POA: Diagnosis not present

## 2022-02-26 DIAGNOSIS — M5412 Radiculopathy, cervical region: Secondary | ICD-10-CM | POA: Diagnosis not present

## 2022-02-26 DIAGNOSIS — Z87891 Personal history of nicotine dependence: Secondary | ICD-10-CM | POA: Diagnosis not present

## 2022-02-26 DIAGNOSIS — E669 Obesity, unspecified: Secondary | ICD-10-CM | POA: Diagnosis not present

## 2022-02-26 DIAGNOSIS — K219 Gastro-esophageal reflux disease without esophagitis: Secondary | ICD-10-CM | POA: Diagnosis not present

## 2022-02-26 DIAGNOSIS — Z96651 Presence of right artificial knee joint: Secondary | ICD-10-CM | POA: Diagnosis not present

## 2022-02-26 DIAGNOSIS — D696 Thrombocytopenia, unspecified: Secondary | ICD-10-CM | POA: Diagnosis not present

## 2022-02-26 DIAGNOSIS — J439 Emphysema, unspecified: Secondary | ICD-10-CM | POA: Diagnosis not present

## 2022-02-26 DIAGNOSIS — Z86010 Personal history of colonic polyps: Secondary | ICD-10-CM | POA: Diagnosis not present

## 2022-02-26 DIAGNOSIS — Z9181 History of falling: Secondary | ICD-10-CM | POA: Diagnosis not present

## 2022-02-26 DIAGNOSIS — E785 Hyperlipidemia, unspecified: Secondary | ICD-10-CM | POA: Diagnosis not present

## 2022-02-26 DIAGNOSIS — I700 Atherosclerosis of aorta: Secondary | ICD-10-CM | POA: Diagnosis not present

## 2022-02-26 DIAGNOSIS — Z7982 Long term (current) use of aspirin: Secondary | ICD-10-CM | POA: Diagnosis not present

## 2022-02-26 DIAGNOSIS — Z6838 Body mass index (BMI) 38.0-38.9, adult: Secondary | ICD-10-CM | POA: Diagnosis not present

## 2022-02-26 DIAGNOSIS — M1990 Unspecified osteoarthritis, unspecified site: Secondary | ICD-10-CM | POA: Diagnosis not present

## 2022-02-26 DIAGNOSIS — J45909 Unspecified asthma, uncomplicated: Secondary | ICD-10-CM | POA: Diagnosis not present

## 2022-02-26 DIAGNOSIS — F32A Depression, unspecified: Secondary | ICD-10-CM | POA: Diagnosis not present

## 2022-02-26 DIAGNOSIS — I872 Venous insufficiency (chronic) (peripheral): Secondary | ICD-10-CM | POA: Diagnosis not present

## 2022-02-26 DIAGNOSIS — Z471 Aftercare following joint replacement surgery: Secondary | ICD-10-CM | POA: Diagnosis not present

## 2022-02-26 DIAGNOSIS — I10 Essential (primary) hypertension: Secondary | ICD-10-CM | POA: Diagnosis not present

## 2022-02-26 NOTE — Telephone Encounter (Signed)
Patient has been worked in to see Dr Marlou Sa on Wednesday.

## 2022-02-26 NOTE — Telephone Encounter (Signed)
Carrie Franco with [facility] called triage. Patient had a right total knee arthroplasty with Dr.Dean on 02/19/2022. She is complaining of a pain level of 8/10. BP is 167/92...nonsymptomatic. She just wanted to make you aware.  Callback if needed [phone]

## 2022-02-28 ENCOUNTER — Ambulatory Visit (INDEPENDENT_AMBULATORY_CARE_PROVIDER_SITE_OTHER): Payer: Medicare Other | Admitting: Orthopedic Surgery

## 2022-02-28 ENCOUNTER — Other Ambulatory Visit: Payer: Self-pay | Admitting: Student

## 2022-02-28 ENCOUNTER — Ambulatory Visit: Payer: Self-pay

## 2022-02-28 ENCOUNTER — Encounter: Payer: Self-pay | Admitting: Orthopedic Surgery

## 2022-02-28 DIAGNOSIS — Z96651 Presence of right artificial knee joint: Secondary | ICD-10-CM
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
## 2022-02-28 NOTE — Progress Notes (Signed)
Communicated with care coordinator for this patient who followed up with her after R total K replacement. Coordinator identified that Patient is unable to leave home and needing assistance. She has limited access to home care and would benefit from [facility] aid services. Patient qualifies for this service. Order has been placed.

## 2022-03-01 ENCOUNTER — Telehealth: Payer: Self-pay | Admitting: Orthopedic Surgery

## 2022-03-01 ENCOUNTER — Ambulatory Visit (HOSPITAL_COMMUNITY)
Admission: RE | Admit: 2022-03-01 | Discharge: 2022-03-01 | Disposition: A | Discharge status: Home / Self Care | Payer: Medicare HMO | Source: Ambulatory Visit | Attending: Surgical | Admitting: Surgical

## 2022-03-01 ENCOUNTER — Telehealth: Payer: Self-pay

## 2022-03-01 DIAGNOSIS — Z96651 Presence of right artificial knee joint: Secondary | ICD-10-CM | POA: Diagnosis not present

## 2022-03-01 NOTE — Telephone Encounter (Signed)
May need to consider making the brace tighter with the straps.  Sometimes those Bledsoe braces are harder to stay on legs while they are moving.  If that does not work, then she will just have to wear the knee immobilizer full-time when she is ambulatory

## 2022-03-01 NOTE — Discharge Summary (Signed)
Physician Discharge Summary      Patient ID: Carrie Franco MRN: [id number] DOB/AGE: 1965-11-13 57 y.o.  Admit date: 02/19/2022 Discharge date: 02/20/2022  Admission Diagnoses:  Principal Problem:   S/P total knee replacement, right Active Problems:   Arthritis of right knee   Discharge Diagnoses:  Same  Surgeries: Procedure(s): RIGHT TOTAL KNEE ARTHROPLASTY on 02/19/2022   Consultants:   Discharged Condition: Stable  Hospital Course: Carrie Franco is an 57 y.o. female who was admitted 02/19/2022 with a chief complaint of right knee pain, and found to have a diagnosis of right knee arthritis.  They were brought to the operating room on 02/19/2022 and underwent the above named procedures.  Pt awoke from anesthesia without complication and was transferred to the floor. On POD1, patient's pain was overall controlled.  She was able to perform well with physical therapy.  She had no red flag signs or symptoms on evaluation or throughout her stay.  She was discharged home on POD 1..  Pt will f/u with Dr. Marlou Sa in clinic in ~2 weeks.   Antibiotics given:  Anti-infectives (From admission, onward)    Start     Dose/Rate Route Frequency Ordered Stop   02/19/22 1600  ceFAZolin (ANCEF) IVPB 2g/100 mL premix        2 g 200 mL/hr over 30 Minutes Intravenous Every 8 hours 02/19/22 1253 02/20/22 1250   02/19/22 0818  vancomycin (VANCOCIN) powder  Status:  Discontinued          As needed 02/19/22 0818 02/19/22 1109   02/19/22 0600  vancomycin (VANCOCIN) IVPB 1000 mg/200 mL premix        1,000 mg 200 mL/hr over 60 Minutes Intravenous On call to O.R. 02/19/22 [id number] 02/19/22 1319     .  Recent vital signs:  Vitals:   02/20/22 0554 02/20/22 0758  BP: 106/65 106/66  Pulse: 86 85  Resp: 18 17  Temp: 98.2 F (36.8 C) 98.4 F (36.9 C)  SpO2: 92% 100%    Recent laboratory studies:  Results for orders placed or performed during the hospital encounter of 02/14/22  Urine Culture   Specimen: Urine,  Clean Catch  Result Value Ref Range   Specimen Description URINE, CLEAN CATCH    Special Requests NONE    Culture      NO GROWTH Performed at [facility], [place] [address] [place], [place] [address]    Report Status 02/15/2022 FINAL   Surgical pcr screen   Specimen: Nasal Mucosa; Nasal Swab  Result Value Ref Range   MRSA, PCR NEGATIVE NEGATIVE   Staphylococcus aureus NEGATIVE NEGATIVE  CBC  Result Value Ref Range   WBC 5.0 4.0 - 10.5 K/uL   RBC 4.37 3.87 - 5.11 MIL/uL   Hemoglobin 12.7 12.0 - 15.0 g/dL   HCT 38.0 36.0 - 46.0 %   MCV 87.0 80.0 - 100.0 fL   MCH 29.1 26.0 - 34.0 pg   MCHC 33.4 30.0 - 36.0 g/dL   RDW 13.7 11.5 - 15.5 %   Platelets 240 150 - 400 K/uL   nRBC 0.0 0.0 - 0.2 %  Basic metabolic panel  Result Value Ref Range   Sodium 139 135 - 145 mmol/L   Potassium 4.0 3.5 - 5.1 mmol/L   Chloride 105 98 - 111 mmol/L   CO2 23 22 - 32 mmol/L   Glucose, Bld 108 (H) 70 - 99 mg/dL   BUN 9 6 - 20 mg/dL   Creatinine,  Ser 0.67 0.44 - 1.00 mg/dL   Calcium 9.3 8.9 - 10.3 mg/dL   GFR, Estimated >60 >60 mL/min   Anion gap 11 5 - 15  Urinalysis, Complete w Microscopic -  Result Value Ref Range   Color, Urine YELLOW YELLOW   APPearance HAZY (A) CLEAR   Specific Gravity, Urine 1.012 1.005 - 1.030   pH 6.0 5.0 - 8.0   Glucose, UA NEGATIVE NEGATIVE mg/dL   Hgb urine dipstick SMALL (A) NEGATIVE   Bilirubin Urine NEGATIVE NEGATIVE   Ketones, ur NEGATIVE NEGATIVE mg/dL   Protein, ur NEGATIVE NEGATIVE mg/dL   Nitrite NEGATIVE NEGATIVE   Leukocytes,Ua LARGE (A) NEGATIVE   RBC / HPF 0-5 0 - 5 RBC/hpf   WBC, UA >50 0 - 5 WBC/hpf   Bacteria, UA RARE (A) NONE SEEN   Squamous Epithelial / HPF 6-10 0 - 5 /HPF    Discharge Medications:   Allergies as of 02/20/2022       Reactions   Orange Fruit [citrus] Hives   Pt describes reaction as big bumps   Orange Oil Hives   Pt describes reaction as big bumps   Other    tomatoes   Clindamycin/lincomycin Hives, Rash    Meloxicam Itching, Rash   Penicillins Hives, Rash, Other (See Comments)        Medication List     STOP taking these medications    oxyCODONE-acetaminophen 5-325 MG tablet Commonly known as: PERCOCET/ROXICET       TAKE these medications    1st Medx-Patch/ Lidocaine 4-0.025-5-20 % Ptch Generic drug: Lido-Capsaicin-Men-Methyl Sal Apply 1 patch topically daily. What changed:  how to take this when to take this reasons to take this   albuterol 108 (90 Base) MCG/ACT inhaler Commonly known as: VENTOLIN HFA Inhale 1 puff into the lungs daily as needed.   amLODipine 10 MG tablet Commonly known as: NORVASC Take 1 tablet (10 mg total) by mouth daily.   aspirin 81 MG chewable tablet Chew 1 tablet (81 mg total) by mouth 2 (two) times daily.   cetirizine 10 MG tablet Commonly known as: ZYRTEC Take 1 tablet by mouth once daily   cyclobenzaprine 5 MG tablet Commonly known as: FLEXERIL Take 1 tablet (5 mg total) by mouth 3 (three) times daily as needed for muscle spasms.   diclofenac Sodium 1 % Gel Commonly known as: Voltaren Apply 2 g topically 4 (four) times daily. What changed:  when to take this reasons to take this   FLUoxetine 40 MG capsule Commonly known as: PROZAC Take 1 capsule by mouth once daily   fluticasone 50 MCG/ACT nasal spray Commonly known as: FLONASE Place 2 sprays into both nostrils daily as needed for allergies.   hydrochlorothiazide 12.5 MG capsule Commonly known as: MICROZIDE Take 1 capsule (12.5 mg total) by mouth daily.   ibuprofen 800 MG tablet Commonly known as: ADVIL Take 1 tablet (800 mg total) by mouth every 6 (six) hours as needed for fever or moderate pain.   olmesartan 40 MG tablet Commonly known as: BENICAR Take 1 tablet (40 mg total) by mouth daily.   oxyCODONE 5 MG immediate release tablet Commonly known as: Oxy IR/ROXICODONE Take 1 tablet (5 mg total) by mouth every 4 (four) hours as needed for moderate pain (pain score  4-6).   pantoprazole 40 MG tablet Commonly known as: PROTONIX Take 1 tablet (40 mg total) by mouth daily.   pregabalin 100 MG capsule Commonly known as: LYRICA Take 1 capsule (100  mg total) by mouth 2 (two) times daily.   triamcinolone ointment 0.1 % Commonly known as: KENALOG Apply topically 2 (two) times daily as needed.   valACYclovir 500 MG tablet Commonly known as: VALTREX Take 1 tablet (500 mg total) by mouth daily. What changed:  when to take this reasons to take this        Diagnostic Studies: VAS [place] LOWER EXTREMITY VENOUS (DVT)  Result Date: 03/01/2022  Lower Venous DVT Study Patient Name:  JAZZMIN NAUTA  Date of Exam:   03/01/2022 Medical Rec #: [id number]      Accession #:    [id number] Date of Birth: 26-Jan-1965      Patient Gender: F Patient Age:   46 years Exam Location:  [facility] Procedure:      VAS [place] LOWER EXTREMITY VENOUS (DVT) Referring Phys: Gloriann Loan --------------------------------------------------------------------------------  Indications: Pain.  Risk Factors: Surgery Right TKA on 02/19/22. Limitations: Body habitus and poor ultrasound/tissue interface. Comparison Study: Previous exam on 12/15/21 was negative for DVT. Performing Technologist: Rogelia Rohrer RVT, RDMS  Examination Guidelines: A complete evaluation includes B-mode imaging, spectral Doppler, color Doppler, and power Doppler as needed of all accessible portions of each vessel. Bilateral testing is considered an integral part of a complete examination. Limited examinations for reoccurring indications may be performed as noted. The reflux portion of the exam is performed with the patient in reverse Trendelenburg.  +--------+---------------+---------+-----------+----------+--------------------+ RIGHT   CompressibilityPhasicitySpontaneityPropertiesThrombus Aging       +--------+---------------+---------+-----------+----------+--------------------+ CFV     Full           Yes      Yes                                        +--------+---------------+---------+-----------+----------+--------------------+ SFJ     Full                                                              +--------+---------------+---------+-----------+----------+--------------------+ FV Prox Full           Yes      Yes                                       +--------+---------------+---------+-----------+----------+--------------------+ FV Mid  Full           Yes      Yes                                       +--------+---------------+---------+-----------+----------+--------------------+ FV                     Yes      Yes                  patent by            Distal  color/doppler        +--------+---------------+---------+-----------+----------+--------------------+ PFV     Full                                                              +--------+---------------+---------+-----------+----------+--------------------+ POP     Full           Yes      Yes                                       +--------+---------------+---------+-----------+----------+--------------------+ PTV     Full                                         Not well visualized  +--------+---------------+---------+-----------+----------+--------------------+ PERO    Full                                         Not well visualized  +--------+---------------+---------+-----------+----------+--------------------+   +----+---------------+---------+-----------+----------+--------------+ LEFTCompressibilityPhasicitySpontaneityPropertiesThrombus Aging +----+---------------+---------+-----------+----------+--------------+ CFV Full           Yes      Yes                                 +----+---------------+---------+-----------+----------+--------------+    Summary: RIGHT: - There is no evidence of deep vein thrombosis in the lower extremity.  - No  cystic structure found in the popliteal fossa. - Ultrasound characteristics of enlarged lymph nodes are noted in the groin.  LEFT: - No evidence of common femoral vein obstruction.  *See table(s) above for measurements and observations.    Preliminary    US THYROID  Result Date: 02/16/2022 CLINICAL DATA:  Incidental on other study.  left thyroid nodule EXAM: THYROID ULTRASOUND TECHNIQUE: Ultrasound examination of the thyroid gland and adjacent soft tissues was performed. COMPARISON:  CT neck, 02/02/2020.  MR C-spine, 04/06/2021. FINDINGS: Parenchymal Echotexture: Normal Isthmus: 0.4 cm Right lobe: 5.0 x 1.4 x 2.1 cm Left lobe: 4.9 x 1.5 x 3.0 cm _________________________________________________________ Estimated total number of nodules >/= 1 cm: 1 Number of spongiform nodules >/=  2 cm not described below (TR1): 0 Number of mixed cystic and solid nodules >/= 1.5 cm not described below (TR2): 0 _________________________________________________________ No > 1 cm solid nodules are present within the thyroid gland. Sub-1 cm nodules scattered within the gland and LEFT thyroid nodule labeled # 1 (1.5 cm LEFT mid cystic TR-1) do not meet threshold for follow-up nor biopsy per current criteria. No cervical adenopathy or abnormal fluid collection within the imaged neck. IMPRESSION: 1. 1.5 cm LEFT cystic (TR-1) thyroid nodule is not recommended for follow-up nor biopsy per current criteria. 2. Small colloid cysts. The above is in keeping with the ACR TI-RADS recommendations - J Am Coll Radiol 2017;14:587-595. Electronically Signed   By: Michaelle Birks M.D.   On: 02/16/2022 11:15    Disposition: Discharge disposition: 01-Home or Self Care       Discharge Instructions     Call MD /  Call 911   Complete by: As directed    If you experience chest pain or shortness of breath, CALL 911 and be transported to the hospital emergency room.  If you develope a fever above 101 F, pus (white drainage) or increased drainage or  redness at the wound, or calf pain, call your surgeon's office.   Constipation Prevention   Complete by: As directed    Drink plenty of fluids.  Prune juice may be helpful.  You may use a stool softener, such as Colace (over the counter) 100 mg twice a day.  Use MiraLax (over the counter) for constipation as needed.   Diet - low sodium heart healthy   Complete by: As directed    Discharge instructions   Complete by: As directed    You may shower, dressing is waterproof.  Do not remove the dressing, we will remove it at your first post-op appointment.  Do not take a bath or soak the knee in a tub or pool.  You may weightbear as you can tolerate on the operative leg with a walker. Wear your brace at all times except when you are showering.  Continue using the CPM machine 3 times per day for one hour each time, increasing the degrees of range of motion daily.  Use the blue cradle boot under your heel to work on getting your leg straight.  Do NOT put a pillow under your knee.  You will follow-up with Dr. Marlou Sa or Lurena Joiner in the clinic in 2 weeks at your given appointment date.    INSTRUCTIONS AFTER JOINT REPLACEMENT   Remove items at home which could result in a fall. This includes throw rugs or furniture in walking pathways ICE to the affected joint every three hours while awake for 30 minutes at a time, for at least the first 3-5 days, and then as needed for pain and swelling.  Continue to use ice for pain and swelling. You may notice swelling that will progress down to the foot and ankle.  This is normal after surgery.  Elevate your leg when you are not up walking on it.   Continue to use the breathing machine you got in the hospital (incentive spirometer) which will help keep your temperature down.  It is common for your temperature to cycle up and down following surgery, especially at night when you are not up moving around and exerting yourself.  The breathing machine keeps your lungs expanded and your  temperature down.   DIET:  As you were doing prior to hospitalization, we recommend a well-balanced diet.  DRESSING / WOUND CARE / SHOWERING  Keep the surgical dressing until follow up.  The dressing is water proof, so you can shower without any extra covering.  IF THE DRESSING FALLS OFF or the wound gets wet inside, change the dressing with sterile gauze.  Please use good hand washing techniques before changing the dressing.  Do not use any lotions or creams on the incision until instructed by your surgeon.    ACTIVITY  Increase activity slowly as tolerated, but follow the weight bearing instructions below.   No driving for 6 weeks or until further direction given by your physician.  You cannot drive while taking narcotics.  No lifting or carrying greater than 10 lbs. until further directed by your surgeon. Avoid periods of inactivity such as sitting longer than an hour when not asleep. This helps prevent blood clots.  You may return to work once you are  authorized by your doctor.     WEIGHT BEARING   Weight bearing as tolerated with assist device (walker, cane, etc) as directed, use it as long as suggested by your surgeon or therapist, typically at least 4-6 weeks.   EXERCISES  Results after joint replacement surgery are often greatly improved when you follow the exercise, range of motion and muscle strengthening exercises prescribed by your doctor. Safety measures are also important to protect the joint from further injury. Any time any of these exercises cause you to have increased pain or swelling, decrease what you are doing until you are comfortable again and then slowly increase them. If you have problems or questions, call your caregiver or physical therapist for advice.   Rehabilitation is important following a joint replacement. After just a few days of immobilization, the muscles of the leg can become weakened and shrink (atrophy).  These exercises are designed to build up the  tone and strength of the thigh and leg muscles and to improve motion. Often times heat used for twenty to thirty minutes before working out will loosen up your tissues and help with improving the range of motion but do not use heat for the first two weeks following surgery (sometimes heat can increase post-operative swelling).   These exercises can be done on a training (exercise) mat, on the floor, on a table or on a bed. Use whatever works the best and is most comfortable for you.    Use music or television while you are exercising so that the exercises are a pleasant break in your day. This will make your life better with the exercises acting as a break in your routine that you can look forward to.   Perform all exercises about fifteen times, three times per day or as directed.  You should exercise both the operative leg and the other leg as well.  Exercises include:   Quad Sets - Tighten up the muscle on the front of the thigh (Quad) and hold for 5-10 seconds.   Straight Leg Raises - With your knee straight (if you were given a brace, keep it on), lift the leg to 60 degrees, hold for 3 seconds, and slowly lower the leg.  Perform this exercise against resistance later as your leg gets stronger.  Leg Slides: Lying on your back, slowly slide your foot toward your buttocks, bending your knee up off the floor (only go as far as is comfortable). Then slowly slide your foot back down until your leg is flat on the floor again.  Angel Wings: Lying on your back spread your legs to the side as far apart as you can without causing discomfort.  Hamstring Strength:  Lying on your back, push your heel against the floor with your leg straight by tightening up the muscles of your buttocks.  Repeat, but this time bend your knee to a comfortable angle, and push your heel against the floor.  You may put a pillow under the heel to make it more comfortable if necessary.   A rehabilitation program following joint  replacement surgery can speed recovery and prevent re-injury in the future due to weakened muscles. Contact your doctor or a physical therapist for more information on knee rehabilitation.    CONSTIPATION  Constipation is defined medically as fewer than three stools per week and severe constipation as less than one stool per week.  Even if you have a regular bowel pattern at home, your normal regimen is likely to be  disrupted due to multiple reasons following surgery.  Combination of anesthesia, postoperative narcotics, change in appetite and fluid intake all can affect your bowels.   YOU MUST use at least one of the following options; they are listed in order of increasing strength to get the job done.  They are all available over the counter, and you may need to use some, POSSIBLY even all of these options:    Drink plenty of fluids (prune juice may be helpful) and high fiber foods Colace 100 mg by mouth twice a day  Senokot for constipation as directed and as needed Dulcolax (bisacodyl), take with full glass of water  Miralax (polyethylene glycol) once or twice a day as needed.  If you have tried all these things and are unable to have a bowel movement in the first 3-4 days after surgery call either your surgeon or your primary doctor.    If you experience loose stools or diarrhea, hold the medications until you stool forms back up.  If your symptoms do not get better within 1 week or if they get worse, check with your doctor.  If you experience "the worst abdominal pain ever" or develop nausea or vomiting, please contact the office immediately for further recommendations for treatment.   ITCHING:  If you experience itching with your medications, try taking only a single pain pill, or even half a pain pill at a time.  You can also use Benadryl over the counter for itching or also to help with sleep.   TED HOSE STOCKINGS:  Use stockings on both legs until for at least 2 weeks or as directed by  physician office. They may be removed at night for sleeping.  MEDICATIONS:  See your medication summary on the "After Visit Summary" that nursing will review with you.  You may have some home medications which will be placed on hold until you complete the course of blood thinner medication.  It is important for you to complete the blood thinner medication as prescribed.  PRECAUTIONS:  If you experience chest pain or shortness of breath - call 911 immediately for transfer to the hospital emergency department.   If you develop a fever greater that 101 F, purulent drainage from wound, increased redness or drainage from wound, foul odor from the wound/dressing, or calf pain - CONTACT YOUR SURGEON.                                                   FOLLOW-UP APPOINTMENTS:  If you do not already have a post-op appointment, please call the office for an appointment to be seen by your surgeon.  Guidelines for how soon to be seen are listed in your "After Visit Summary", but are typically between 1-4 weeks after surgery.  OTHER INSTRUCTIONS:   Knee Replacement:  Do not place pillow under knee, focus on keeping the knee straight while resting. CPM instructions: 0-90 degrees, 2 hours in the morning, 2 hours in the afternoon, and 2 hours in the evening. Place foam block, curve side up under heel at all times except when in CPM or when walking.  DO NOT modify, tear, cut, or change the foam block in any way.  POST-OPERATIVE OPIOID TAPER INSTRUCTIONS: It is important to wean off of your opioid medication as soon as possible. If you do not need pain medication  after your surgery it is ok to stop day one. Opioids include: Codeine, Hydrocodone(Norco, Vicodin), Oxycodone(Percocet, oxycontin) and hydromorphone amongst others.  Long term and even short term use of opiods can cause: Increased pain response Dependence Constipation Depression Respiratory depression And more.  Withdrawal symptoms can include Flu  like symptoms Nausea, vomiting And more Techniques to manage these symptoms Hydrate well Eat regular healthy meals Stay active Use relaxation techniques(deep breathing, meditating, yoga) Do Not substitute Alcohol to help with tapering If you have been on opioids for less than two weeks and do not have pain than it is ok to stop all together.  Plan to wean off of opioids This plan should start within one week post op of your joint replacement. Maintain the same interval or time between taking each dose and first decrease the dose.  Cut the total daily intake of opioids by one tablet each day Next start to increase the time between doses. The last dose that should be eliminated is the evening dose.   MAKE SURE YOU:  Understand these instructions.  Get help right away if you are not doing well or get worse.    Thank you for letting us be a part of your medical care team.  It is a privilege we respect greatly.  We hope these instructions will help you stay on track for a fast and full recovery!    Dental Antibiotics:  In most cases prophylactic antibiotics for Dental procdeures after total joint surgery are not necessary.  Exceptions are as follows:  1. History of prior total joint infection  2. Severely immunocompromised (Organ Transplant, cancer chemotherapy, Rheumatoid biologic meds such as [place])  3. Poorly controlled diabetes (A1C &gt; 8.0, blood glucose over 200)  If you have one of these conditions, contact your surgeon for an antibiotic prescription, prior to your dental procedure.   Increase activity slowly as tolerated   Complete by: As directed    Post-operative opioid taper instructions:   Complete by: As directed    POST-OPERATIVE OPIOID TAPER INSTRUCTIONS: It is important to wean off of your opioid medication as soon as possible. If you do not need pain medication after your surgery it is ok to stop day one. Opioids include: Codeine, Hydrocodone(Norco,  Vicodin), Oxycodone(Percocet, oxycontin) and hydromorphone amongst others.  Long term and even short term use of opiods can cause: Increased pain response Dependence Constipation Depression Respiratory depression And more.  Withdrawal symptoms can include Flu like symptoms Nausea, vomiting And more Techniques to manage these symptoms Hydrate well Eat regular healthy meals Stay active Use relaxation techniques(deep breathing, meditating, yoga) Do Not substitute Alcohol to help with tapering If you have been on opioids for less than two weeks and do not have pain than it is ok to stop all together.  Plan to wean off of opioids This plan should start within one week post op of your joint replacement. Maintain the same interval or time between taking each dose and first decrease the dose.  Cut the total daily intake of opioids by one tablet each day Next start to increase the time between doses. The last dose that should be eliminated is the evening dose.           Follow-up Information     [name], MD Follow up.   Contact information: [place] [address] [phone]         Health, [place] Follow up.   Specialty: Home Health Services Why: home  health services will be provided by [facility], start of care within 48 hours post discharge Contact information: [address] [place] [address] [phone]                  Signed: Annie Main 03/01/2022, 4:09 PM

## 2022-03-01 NOTE — Telephone Encounter (Signed)
We called to discuss

## 2022-03-01 NOTE — Telephone Encounter (Signed)
I'm sorry this is not a Triage call.  You just have to take a message and send it to the assistant.

## 2022-03-01 NOTE — Telephone Encounter (Signed)
Patient states the brace will not stay on her leg...sending call to Triage.Marland Kitchen

## 2022-03-01 NOTE — Telephone Encounter (Signed)
thx

## 2022-03-01 NOTE — Addendum Note (Signed)
Addended byLaurann Montana on: 03/01/2022 02:27 PM   Modules accepted: Orders

## 2022-03-01 NOTE — Telephone Encounter (Signed)
Triage call from [place] with [facility]: This is mainly an FYI for Dr. Marlou Sa: Estill Bamberg went to the patient's house for PT this morning and the patient told her she was going out to have a venous doppler (which is accurate, according to her chart). So, she did miss this day's PT appt. - just wanted to make sure we were aware.If any questions, Estill Bamberg can be reached at [phone].

## 2022-03-02 DIAGNOSIS — D696 Thrombocytopenia, unspecified: Secondary | ICD-10-CM | POA: Diagnosis not present

## 2022-03-02 DIAGNOSIS — Z8673 Personal history of transient ischemic attack (TIA), and cerebral infarction without residual deficits: Secondary | ICD-10-CM | POA: Diagnosis not present

## 2022-03-02 DIAGNOSIS — Z96651 Presence of right artificial knee joint: Secondary | ICD-10-CM | POA: Diagnosis not present

## 2022-03-02 DIAGNOSIS — Z9181 History of falling: Secondary | ICD-10-CM | POA: Diagnosis not present

## 2022-03-02 DIAGNOSIS — M5417 Radiculopathy, lumbosacral region: Secondary | ICD-10-CM | POA: Diagnosis not present

## 2022-03-02 DIAGNOSIS — I10 Essential (primary) hypertension: Secondary | ICD-10-CM | POA: Diagnosis not present

## 2022-03-02 DIAGNOSIS — E669 Obesity, unspecified: Secondary | ICD-10-CM | POA: Diagnosis not present

## 2022-03-02 DIAGNOSIS — E785 Hyperlipidemia, unspecified: Secondary | ICD-10-CM | POA: Diagnosis not present

## 2022-03-02 DIAGNOSIS — Z6838 Body mass index (BMI) 38.0-38.9, adult: Secondary | ICD-10-CM | POA: Diagnosis not present

## 2022-03-02 DIAGNOSIS — D649 Anemia, unspecified: Secondary | ICD-10-CM | POA: Diagnosis not present

## 2022-03-02 DIAGNOSIS — M1990 Unspecified osteoarthritis, unspecified site: Secondary | ICD-10-CM | POA: Diagnosis not present

## 2022-03-02 DIAGNOSIS — Z7982 Long term (current) use of aspirin: Secondary | ICD-10-CM | POA: Diagnosis not present

## 2022-03-02 DIAGNOSIS — J45909 Unspecified asthma, uncomplicated: Secondary | ICD-10-CM | POA: Diagnosis not present

## 2022-03-02 DIAGNOSIS — I872 Venous insufficiency (chronic) (peripheral): Secondary | ICD-10-CM | POA: Diagnosis not present

## 2022-03-02 DIAGNOSIS — F32A Depression, unspecified: Secondary | ICD-10-CM | POA: Diagnosis not present

## 2022-03-02 DIAGNOSIS — M5412 Radiculopathy, cervical region: Secondary | ICD-10-CM | POA: Diagnosis not present

## 2022-03-02 DIAGNOSIS — K219 Gastro-esophageal reflux disease without esophagitis: Secondary | ICD-10-CM | POA: Diagnosis not present

## 2022-03-02 DIAGNOSIS — Z86010 Personal history of colonic polyps: Secondary | ICD-10-CM | POA: Diagnosis not present

## 2022-03-02 DIAGNOSIS — I700 Atherosclerosis of aorta: Secondary | ICD-10-CM | POA: Diagnosis not present

## 2022-03-02 DIAGNOSIS — J439 Emphysema, unspecified: Secondary | ICD-10-CM | POA: Diagnosis not present

## 2022-03-02 DIAGNOSIS — Z87891 Personal history of nicotine dependence: Secondary | ICD-10-CM | POA: Diagnosis not present

## 2022-03-02 DIAGNOSIS — Z471 Aftercare following joint replacement surgery: Secondary | ICD-10-CM | POA: Diagnosis not present

## 2022-03-05 ENCOUNTER — Other Ambulatory Visit: Payer: Medicare Other | Admitting: Obstetrics and Gynecology

## 2022-03-05 ENCOUNTER — Other Ambulatory Visit: Payer: Self-pay | Admitting: Physical Medicine and Rehabilitation

## 2022-03-05 ENCOUNTER — Telehealth: Payer: Self-pay | Admitting: Registered Nurse

## 2022-03-05 ENCOUNTER — Other Ambulatory Visit: Payer: Self-pay | Admitting: Surgical

## 2022-03-05 DIAGNOSIS — Z8673 Personal history of transient ischemic attack (TIA), and cerebral infarction without residual deficits: Secondary | ICD-10-CM | POA: Diagnosis not present

## 2022-03-05 DIAGNOSIS — J439 Emphysema, unspecified: Secondary | ICD-10-CM | POA: Diagnosis not present

## 2022-03-05 DIAGNOSIS — Z7982 Long term (current) use of aspirin: Secondary | ICD-10-CM | POA: Diagnosis not present

## 2022-03-05 DIAGNOSIS — Z87891 Personal history of nicotine dependence: Secondary | ICD-10-CM | POA: Diagnosis not present

## 2022-03-05 DIAGNOSIS — I10 Essential (primary) hypertension: Secondary | ICD-10-CM | POA: Diagnosis not present

## 2022-03-05 DIAGNOSIS — Z6838 Body mass index (BMI) 38.0-38.9, adult: Secondary | ICD-10-CM | POA: Diagnosis not present

## 2022-03-05 DIAGNOSIS — D696 Thrombocytopenia, unspecified: Secondary | ICD-10-CM | POA: Diagnosis not present

## 2022-03-05 DIAGNOSIS — E669 Obesity, unspecified: Secondary | ICD-10-CM | POA: Diagnosis not present

## 2022-03-05 DIAGNOSIS — Z96651 Presence of right artificial knee joint: Secondary | ICD-10-CM | POA: Diagnosis not present

## 2022-03-05 DIAGNOSIS — Z86010 Personal history of colonic polyps: Secondary | ICD-10-CM | POA: Diagnosis not present

## 2022-03-05 DIAGNOSIS — F32A Depression, unspecified: Secondary | ICD-10-CM | POA: Diagnosis not present

## 2022-03-05 DIAGNOSIS — K219 Gastro-esophageal reflux disease without esophagitis: Secondary | ICD-10-CM | POA: Diagnosis not present

## 2022-03-05 DIAGNOSIS — E785 Hyperlipidemia, unspecified: Secondary | ICD-10-CM | POA: Diagnosis not present

## 2022-03-05 DIAGNOSIS — M1990 Unspecified osteoarthritis, unspecified site: Secondary | ICD-10-CM | POA: Diagnosis not present

## 2022-03-05 DIAGNOSIS — Z9181 History of falling: Secondary | ICD-10-CM | POA: Diagnosis not present

## 2022-03-05 DIAGNOSIS — I872 Venous insufficiency (chronic) (peripheral): Secondary | ICD-10-CM | POA: Diagnosis not present

## 2022-03-05 DIAGNOSIS — J45909 Unspecified asthma, uncomplicated: Secondary | ICD-10-CM | POA: Diagnosis not present

## 2022-03-05 DIAGNOSIS — M5417 Radiculopathy, lumbosacral region: Secondary | ICD-10-CM | POA: Diagnosis not present

## 2022-03-05 DIAGNOSIS — M5412 Radiculopathy, cervical region: Secondary | ICD-10-CM | POA: Diagnosis not present

## 2022-03-05 DIAGNOSIS — I700 Atherosclerosis of aorta: Secondary | ICD-10-CM | POA: Diagnosis not present

## 2022-03-05 DIAGNOSIS — D649 Anemia, unspecified: Secondary | ICD-10-CM | POA: Diagnosis not present

## 2022-03-05 DIAGNOSIS — Z471 Aftercare following joint replacement surgery: Secondary | ICD-10-CM | POA: Diagnosis not present

## 2022-03-05 MED ORDER — OXYCODONE HCL 5 MG PO TABS: 5.0000 mg | ORAL_TABLET | ORAL | 0 refills | Status: DC | PRN

## 2022-03-05 NOTE — Patient Instructions (Signed)
Visit Information  [name] Pulse  - as a part of your Medicaid benefit, you are eligible for care management and care coordination services at no cost or copay. I was unable to reach you by phone today but would be happy to help you with your health related needs. Please feel free to call me at [phone]  A member of the Managed Medicaid care management team will reach out to you again over the next 30 business days.  Aida Raider RN, BSN [place]  [facility] - Managed Medicaid High Risk [phone]

## 2022-03-05 NOTE — Telephone Encounter (Signed)
Patient called back , states she will run out of medication today , informed Zella Ball is out -routing to Dr Ranell Patrick

## 2022-03-05 NOTE — Patient Outreach (Signed)
  Medicaid Managed Care   Unsuccessful Attempt Note   03/05/2022 Name: Carrie Franco MRN: [id number] DOB: 1965-05-12  Referred by: [name], MD Reason for referral : High Risk Managed Medicaid (Unsuccessful telephone outreach)   An unsuccessful telephone outreach was attempted today. The patient was referred to the case management team for assistance with care management and care coordination.    Follow Up Plan: The Managed Medicaid care management team will reach out to the patient again over the next 30 business  days. and The  Patient has been provided with contact information for the Managed Medicaid care management team and has been advised to call with any health related questions or concerns.    Aida Raider RN, BSN [place]  [facility] - Managed Medicaid High Risk [phone]

## 2022-03-05 NOTE — Telephone Encounter (Signed)
Patient called in requesting a refill on her oxycodone , patient would like medication sent to [facility] on [address] , states she just had surgery

## 2022-03-06 ENCOUNTER — Other Ambulatory Visit: Payer: Self-pay | Admitting: Physical Medicine and Rehabilitation

## 2022-03-06 ENCOUNTER — Other Ambulatory Visit: Payer: Self-pay

## 2022-03-06 ENCOUNTER — Telehealth: Payer: Self-pay

## 2022-03-06 ENCOUNTER — Ambulatory Visit: Payer: Medicare HMO | Attending: Surgical | Admitting: Physical Therapy

## 2022-03-06 ENCOUNTER — Encounter: Payer: Self-pay | Admitting: Physical Therapy

## 2022-03-06 DIAGNOSIS — R600 Localized edema: Secondary | ICD-10-CM | POA: Diagnosis present

## 2022-03-06 DIAGNOSIS — G894 Chronic pain syndrome: Secondary | ICD-10-CM

## 2022-03-06 DIAGNOSIS — G8929 Other chronic pain: Secondary | ICD-10-CM

## 2022-03-06 DIAGNOSIS — Z96651 Presence of right artificial knee joint: Secondary | ICD-10-CM | POA: Insufficient documentation

## 2022-03-06 DIAGNOSIS — M25561 Pain in right knee: Secondary | ICD-10-CM | POA: Diagnosis present

## 2022-03-06 DIAGNOSIS — R2689 Other abnormalities of gait and mobility: Secondary | ICD-10-CM

## 2022-03-06 DIAGNOSIS — M6281 Muscle weakness (generalized): Secondary | ICD-10-CM

## 2022-03-06 MED ORDER — OXYCODONE-ACETAMINOPHEN 5-325 MG PO TABS: 1.0000 | ORAL_TABLET | Freq: Four times a day (QID) | ORAL | 0 refills | Status: DC | PRN

## 2022-03-06 NOTE — Telephone Encounter (Signed)
Dr. Ranell Patrick sent in the replacement script. Patient has been informed she may have to pay out of pocket for the new RX. Also to take the remainder of the last Rx to the pharmacy for disposal. Pharmacy has been informed about the issue.
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
## 2022-03-06 NOTE — Patient Instructions (Signed)
Access Code: [email or web address] URL: [email or web address] Date: 03/06/2022 Prepared by: Hilda Blades  Exercises - Supine Quad Set on Towel Roll  - 3 x daily - 10 reps - Active Straight Leg Raise with Quad Set  - 3 x daily - 10 reps - Supine Heel Slide with Strap  - 3 x daily - 10 reps - 5 econds hold - Long Sitting Calf Stretch with Strap  - 3 x daily - 3 sets - 5 reps - 20 seconds hold - Seated Knee Flexion AAROM  - 3 x daily - 3 sets - 10 reps - 10 seconds hold - Seated Knee Flexion Extension AROM   - 3 x daily - 10 reps - 5 seconds hold

## 2022-03-06 NOTE — Telephone Encounter (Signed)
Patient was last given Oxycodone 5 MG. Which is not helping here Post op from right knee replacement on 02/19/2021.  No pain medication was given by Dr. Marlou Sa.   She is requesting Oxycodone 5-325 MG to be sent to [facility] on [facility] ASAP. Please send Oxycodone 5-325 MG to the pharmacy.   Filled  Written  ID  Drug  QTY  Days  Prescriber  RX #  Dispenser  Refill  Daily Dose*  Pymt Type  PMP  03/05/2022 03/05/2022 2  Oxycodone Hcl (Ir) 5 Mg Tablet 120.00 20 Kr Rau [id number] Sam ([id number]) 0/0 45.00 MME Medicare [place] 03/05/2022 12/12/2021 2  Pregabalin 100 Mg Capsule 60.00 30 Mo Con [id number] Sam ([id number]) 0/0 1.34 LME Medicare [place] 02/16/2022 02/15/2022 2  Oxycodone-Acetaminophen 5-325 120.00 30 Kr Rau [id number] Sam ([id number]) 0/0 30.00 MME Medicare [place] 02/13/2022 01/16/2022 2  Pregabalin 100 Mg Capsule 60.00 30 Er [place] [id number] Sam ([id number]) 1/1 1.34 LME Medicaid 

## 2022-03-07 ENCOUNTER — Encounter: Payer: Medicaid Other | Admitting: Orthopedic Surgery

## 2022-03-08 ENCOUNTER — Ambulatory Visit: Payer: Medicare HMO | Admitting: Physical Therapy

## 2022-03-08 ENCOUNTER — Telehealth: Payer: Self-pay

## 2022-03-08 ENCOUNTER — Other Ambulatory Visit: Payer: Self-pay

## 2022-03-08 ENCOUNTER — Ambulatory Visit (INDEPENDENT_AMBULATORY_CARE_PROVIDER_SITE_OTHER): Payer: Medicare HMO

## 2022-03-08 ENCOUNTER — Encounter: Payer: Self-pay | Admitting: Physical Therapy

## 2022-03-08 ENCOUNTER — Ambulatory Visit (INDEPENDENT_AMBULATORY_CARE_PROVIDER_SITE_OTHER): Payer: Medicare HMO | Admitting: Internal Medicine

## 2022-03-08 ENCOUNTER — Encounter: Payer: Self-pay | Admitting: Internal Medicine

## 2022-03-08 ENCOUNTER — Other Ambulatory Visit: Payer: Medicare Other

## 2022-03-08 VITALS — BP 112/74 | Ht 63.0 in | Wt 225.5 lb

## 2022-03-08 VITALS — BP 127/74 | HR 83 | Temp 98.0°F | Ht 63.0 in | Wt 225.7 lb

## 2022-03-08 DIAGNOSIS — Z0000 Encounter for general adult medical examination without abnormal findings: Secondary | ICD-10-CM | POA: Diagnosis not present

## 2022-03-08 DIAGNOSIS — M6281 Muscle weakness (generalized): Secondary | ICD-10-CM | POA: Diagnosis not present

## 2022-03-08 DIAGNOSIS — R2689 Other abnormalities of gait and mobility: Secondary | ICD-10-CM

## 2022-03-08 DIAGNOSIS — R058 Other specified cough: Secondary | ICD-10-CM | POA: Diagnosis not present

## 2022-03-08 DIAGNOSIS — G8929 Other chronic pain: Secondary | ICD-10-CM

## 2022-03-08 DIAGNOSIS — Z96651 Presence of right artificial knee joint: Secondary | ICD-10-CM | POA: Diagnosis not present

## 2022-03-08 DIAGNOSIS — M25561 Pain in right knee: Secondary | ICD-10-CM | POA: Diagnosis not present

## 2022-03-08 DIAGNOSIS — R600 Localized edema: Secondary | ICD-10-CM

## 2022-03-08 MED ORDER — BENZONATATE 100 MG PO CAPS: 200.0000 mg | ORAL_CAPSULE | Freq: Three times a day (TID) | ORAL | 0 refills | Status: DC | PRN
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
## 2022-03-08 NOTE — Telephone Encounter (Signed)
I called advised her to consider taking Tylenol with the oxycodone because she states that the oxycodone does not work but the [employer] she had did work.

## 2022-03-08 NOTE — Telephone Encounter (Signed)
Patient called stating she was in so much pain in her knee/leg. States the pain medication isn't helping at all. Denies calf pain. States it more in her thigh and foot is swelling "again" She is in tears stating she is in terrible pain.

## 2022-03-08 NOTE — Patient Instructions (Signed)
Thank you, Ms.Madhuri Domingo Pulse for allowing [place] to provide your care today.  Cough I have sent in 20 tablets for the tessalon pearls. Please make a follow-up for 6 weeks to see if this resolved. If cough has improved you can cancel appointment.   I have ordered the following medication/changed the following medications:   Stop the following medications: There are no discontinued medications.   Start the following medications: No orders of the defined types were placed in this encounter.    We look forward to seeing you next time. Please call our clinic at [phone] if you have any questions or concerns. The best time to call is Monday-Friday from 9am-4pm, but there is someone available 24/7. If after hours or the weekend, call the main hospital number and ask for the Internal Medicine Resident On-Call. If you need medication refills, please notify your pharmacy one week in advance and they will send [place] a request.   Thank you for trusting me with your care. Wishing you the best!   Christiana Fuchs, [place]

## 2022-03-08 NOTE — Patient Instructions (Signed)
Visit Information  Carrie Franco was given information about Medicaid Managed Care team care coordination services as a part of their [facility] Medicaid benefit. Carrie Franco verbally consented to engagement with the [facility] Managed Care team.   If you are experiencing a medical emergency, please call 911 or report to your local emergency department or urgent care.   If you have a non-emergency medical problem during routine business hours, please contact your provider's office and ask to speak with a nurse.   For questions related to your Healthy [facility] health plan, please call: [phone] or visit the homepage here: [email or web address]  If you would like to schedule transportation through your Healthy [facility] plan, please call the following number at least 2 days in advance of your appointment: [phone]  For information about your ride after you set it up, call Ride Assist at [phone]. Use this number to activate a Will Call pickup, or if your transportation is late for a scheduled pickup. Use this number, too, if you need to make a change or cancel a previously scheduled reservation.  If you need transportation services right away, call [phone]. The after-hours call center is staffed 24 hours to handle ride assistance and urgent reservation requests (including discharges) 365 days a year. Urgent trips include sick visits, hospital discharge requests and life-sustaining treatment.  Call the [place] at [phone], at any time, 24 hours a day, 7 days a week. If you are in danger or need immediate medical attention call 911.  If you would like help to quit smoking, call 1-800-QUIT-NOW 715 527 5073) OR Espaol: 1-855-Djelo-Ya HD:1601594) o para ms informacin haga clic aqu or Text READY to 200-400 to register via text  Carrie Franco - following are the goals we discussed in your visit today:   Goals  Addressed   None     Social Worker will follow up in 30 days.  Carrie Franco, BSW, [place] Managed Medicaid Team  [phone]   Following is a copy of your plan of care:  There are no care plans that you recently modified to display for this patient.

## 2022-03-08 NOTE — Patient Outreach (Signed)
Medicaid Managed Care Social Work Note  03/08/2022 Name:  Carrie Franco MRN:  [id number] DOB:  [birth year]  Carrie Franco is an 57 y.o. year old female who is a primary patient of Carrie Juniper, MD.  The [place] team was consulted for assistance with:  Community Resources   [name] was given information about Medicaid Managed Care Coordination team services today. Carrie Franco Patient agreed to services and verbal consent obtained.  Engaged with patient  for by telephone forfollow up visit in response to referral for case management and/or care coordination services.   Assessments/Interventions:  Review of past medical history, allergies, medications, health status, including review of consultants reports, laboratory and other test data, was performed as part of comprehensive evaluation and provision of chronic care management services.  SDOH: (Social Determinant of Health) assessments and interventions performed: SDOH Interventions    Flowsheet Row Admission (Discharged) from 02/19/2022 in [facility] Office Visit from 02/14/2022 in [facility] Office Visit from 02/01/2022 in [facility] Telephone from 12/19/2021 in [facility] Office Visit from 12/14/2021 in [facility] Office Visit from 11/15/2021 in [facility]  SDOH Interventions        Food Insecurity Interventions Inpatient TOC, Other (Comment)  [food pantry resource list provided] -- -- -- Intervention Not Indicated --  Housing Interventions -- -- -- -- Intervention Not Indicated --  Transportation Interventions Inpatient TOC, Other (Comment)  Science writer provided] -- -- Other (Comment)  [Provided with [employer] medical [phone]] Intervention Not Indicated --  Utilities Interventions -- -- Intervention  Not Indicated -- Intervention Not Indicated --  Alcohol Usage Interventions -- -- -- -- Intervention Not Indicated (Score <7) --  Depression Interventions/Treatment  -- --  [Provider made aware] --  [Provider made aware] -- -- --  [Provider made aware]  Financial Strain Interventions -- -- -- -- Intervention Not Indicated --  Physical Activity Interventions -- -- -- -- Intervention Not Indicated --  Stress Interventions -- -- -- -- Intervention Not Indicated --  Social Connections Interventions -- -- -- -- Intervention Not Indicated --     BSW completed a telephone outreach with patient, she stated she was unsure if she received the resources BSW sent to her because she is in so much pain from her surgery. BSW read in the chart that patient did call the doctors office this morning requesting some other medications for pain. BSW has not received a follow up response to [name] yet.  Advanced Directives Status:  Not addressed in this encounter.  Care Plan                 Allergies  Allergen Reactions   Orange Fruit [Citrus] Hives    Pt describes reaction as big bumps   Orange Oil Hives    Pt describes reaction as big bumps   Other     tomatoes   Clindamycin/Lincomycin Hives and Rash   Meloxicam Itching and Rash   Penicillins Hives, Rash and Other (See Comments)    Medications Reviewed Today     Reviewed by Bobette Mo, PT (Physical Therapist) on 03/06/22 at 34  Med List Status: <None>   Medication Order Taking? Sig Documenting Provider Last Dose Status Informant  albuterol (VENTOLIN HFA) 108 (90 Base) MCG/ACT inhaler [id number] No Inhale 1 puff into the lungs daily as needed. Iona Beard,  MD Past Week Active Self  amLODipine (NORVASC) 10 MG tablet [id number] No Take 1 tablet (10 mg total) by mouth daily. Carrie Juniper, MD 02/19/2022 Active Self  aspirin 81 MG chewable tablet [id number]  Chew 1 tablet (81 mg total) by mouth 2 (two) times daily. Magnant, Gerrianne Scale, PA-C   Active   cetirizine (ZYRTEC) 10 MG tablet [id number] No Take 1 tablet by mouth once daily Idamae Schuller, MD 02/18/2022 Active Self  cyclobenzaprine (FLEXERIL) 5 MG tablet [id number]  Take 1 tablet by mouth three times daily as needed for muscle spasm Raulkar, Clide Deutscher, MD  Active   diclofenac Sodium (VOLTAREN) 1 % GEL [id number] No Apply 2 g topically 4 (four) times daily.  Patient taking differently: Apply 2 g topically 4 (four) times daily as needed (pain).   Izora Ribas, MD Past Week Active Self  FLUoxetine (PROZAC) 40 MG capsule [id number] No Take 1 capsule by mouth once daily Idamae Schuller, MD 02/19/2022 Active Self  fluticasone (FLONASE) 50 MCG/ACT nasal spray [id number] No Place 2 sprays into both nostrils daily as needed for allergies. [provider] 02/18/2022 Active Self  hydrochlorothiazide (MICROZIDE) 12.5 MG capsule [id number] No Take 1 capsule (12.5 mg total) by mouth daily. Carrie Juniper, MD 02/19/2022 Active Self  ibuprofen (ADVIL) 800 MG tablet [id number] No Take 1 tablet (800 mg total) by mouth every 6 (six) hours as needed for fever or moderate pain. Serita Butcher, MD Past Week Active   Lido-Capsaicin-Men-Methyl Sal (1ST MEDX-PATCH/ LIDOCAINE) 4-0.025-5-20 % PTCH [id number] No Apply 1 patch topically daily.  Patient taking differently: Place 1 patch onto the skin daily as needed.   Idamae Schuller, MD Past Week Active Self  olmesartan (BENICAR) 40 MG tablet [id number] No Take 1 tablet (40 mg total) by mouth daily. Carrie Juniper, MD 02/19/2022 Active Self  oxyCODONE (ROXICODONE) 5 MG immediate release tablet [id number]  Take 1 tablet (5 mg total) by mouth every 4 (four) hours as needed for severe pain. Izora Ribas, MD  Active   pantoprazole (PROTONIX) 40 MG tablet [id number] No Take 1 tablet (40 mg total) by mouth daily. Carrie Juniper, MD 02/19/2022 Active Self  pregabalin (LYRICA) 100 MG capsule [id number] No Take 1 capsule (100 mg total) by mouth 2 (two)  times daily.  Patient not taking: Reported on 02/12/2022   Lucious Groves, DO Not Taking Active Self  triamcinolone ointment (KENALOG) 0.1 % [id number] No Apply topically 2 (two) times daily as needed. Izora Ribas, MD Past Week Active Self  valACYclovir (VALTREX) 500 MG tablet [id number] No Take 1 tablet (500 mg total) by mouth daily.  Patient taking differently: Take 500 mg by mouth daily as needed (outbreaks).   [name] Presto, MD Taking Active Self            Patient Active Problem List   Diagnosis Date Noted   S/P total knee replacement, 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   Rhomboid muscle strain 02/14/2022   Preoperative clearance 02/01/2022   Thrombocytopenia ([place]) 02/01/2022   Epistaxis 11/15/2021   Thyroid nodule 12/01/2020   Headache 10/02/2020   Cervical radiculopathy 08/12/2020   Aortic atherosclerosis ([place]) 02/17/2020   Emphysema of lung ([place]) 02/17/2020   Neuropathic pain 11/26/2019   Strain of thoracic paraspinal muscles excluding T1 and T2 levels 04/18/2018   Onychomycosis 12/04/2017   Radiculopathy of lumbosacral region 06/24/2017   Depression 01/02/2017   Osteoarthritis 12/05/2015   Arthritis of right knee 12/05/2015   Trigeminal neuralgia of right side of face 11/09/2015  Eczema 04/01/2015   Chronic neck pain 03/08/2015   History of colonic polyps 09/07/2014   Normocytic anemia 09/07/2014   Hyperlipidemia 07/31/2013   GERD (gastroesophageal reflux disease) 03/19/2013   Cerumen impaction 08/07/2012   Obesity (BMI 30.0-34.9) 08/05/2012   Right thigh pain 07/17/2012   Vaginal discharge 06/22/2011   Persistent asthma 02/23/2010   Healthcare maintenance 02/23/2010   Essential hypertension 01/13/2007   History of herpes genitalis 01/02/2006   Allergic sinusitis 01/02/2006    Conditions to be addressed/monitored per PCP order:   community resources  There are no care plans that you recently modified to display for this patient.   Follow up:  Patient agrees to  Care Plan and Follow-up.  Plan: The Managed Medicaid care management team will reach out to the patient again over the next 30 days.  Date/time of next scheduled Social Work care management/care coordination outreach:  04/06/22  Mickel Fuchs, [place]a Miss, [place] Medicaid Team  [phone]
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
## 2022-03-08 NOTE — Patient Instructions (Signed)

## 2022-03-09 DIAGNOSIS — R058 Other specified cough: Secondary | ICD-10-CM | POA: Insufficient documentation

## 2022-03-09 NOTE — Assessment & Plan Note (Signed)
She has had nonproductive cough for last 7 days. Her granddaughter was recently sick with similar symptoms concerning for virus. She tested negative for covid, symptoms are not typical of flu and she is outside window for anti-virals to be beneficial. P: Treat cough with tessalon pearls as she has found this helpful in the past. Continue delsym and tylenol cold and flu.

## 2022-03-12 ENCOUNTER — Ambulatory Visit: Payer: Medicare HMO

## 2022-03-12 DIAGNOSIS — G8929 Other chronic pain: Secondary | ICD-10-CM

## 2022-03-12 DIAGNOSIS — M6281 Muscle weakness (generalized): Secondary | ICD-10-CM

## 2022-03-12 DIAGNOSIS — R600 Localized edema: Secondary | ICD-10-CM | POA: Diagnosis not present

## 2022-03-12 DIAGNOSIS — R2689 Other abnormalities of gait and mobility: Secondary | ICD-10-CM | POA: Diagnosis not present

## 2022-03-12 DIAGNOSIS — M25561 Pain in right knee: Secondary | ICD-10-CM | POA: Diagnosis not present

## 2022-03-12 DIAGNOSIS — Z96651 Presence of right artificial knee joint: Secondary | ICD-10-CM | POA: Diagnosis not present
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
## 2022-03-13 NOTE — Progress Notes (Signed)
Internal Medicine Clinic Attending  Case discussed with Dr. Masters  At the time of the visit.  We reviewed the resident's history and exam and pertinent patient test results.  I agree with the assessment, diagnosis, and plan of care documented in the resident's note.  

## 2022-03-14 ENCOUNTER — Ambulatory Visit (INDEPENDENT_AMBULATORY_CARE_PROVIDER_SITE_OTHER): Payer: Medicare HMO | Admitting: Surgical

## 2022-03-14 ENCOUNTER — Encounter: Payer: Self-pay | Admitting: Orthopedic Surgery

## 2022-03-14 DIAGNOSIS — Z96651 Presence of right artificial knee joint: Secondary | ICD-10-CM

## 2022-03-14 MED ORDER — CYCLOBENZAPRINE HCL 5 MG PO TABS: 5.0000 mg | ORAL_TABLET | Freq: Three times a day (TID) | ORAL | 0 refills | Status: DC | PRN
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
## 2022-03-15 ENCOUNTER — Telehealth: Payer: Self-pay

## 2022-03-15 NOTE — Telephone Encounter (Signed)
I called patient this morning about  her home health order that was put in on 02-28-2022.The patient goes out to PT patient does not have a wound that needs home health intervention because she has been going out to her Orht[facility] appointments and is able to get around please see reports on her visits.The patient wanted to get a aide for a short period of time.We have been unable to and patient is not homebound.Patient is S/P Right knee replacement [place], [place] C2/29/202411:50 AM

## 2022-03-16 ENCOUNTER — Encounter: Payer: Medicare HMO | Attending: Physical Medicine and Rehabilitation | Admitting: Physical Medicine and Rehabilitation

## 2022-03-16 ENCOUNTER — Other Ambulatory Visit: Payer: Medicare HMO | Admitting: Obstetrics and Gynecology

## 2022-03-16 ENCOUNTER — Encounter: Payer: Self-pay | Admitting: Obstetrics and Gynecology

## 2022-03-16 DIAGNOSIS — Z79891 Long term (current) use of opiate analgesic: Secondary | ICD-10-CM | POA: Insufficient documentation

## 2022-03-16 DIAGNOSIS — Z5181 Encounter for therapeutic drug level monitoring: Secondary | ICD-10-CM | POA: Insufficient documentation

## 2022-03-16 DIAGNOSIS — M545 Low back pain, unspecified: Secondary | ICD-10-CM | POA: Insufficient documentation

## 2022-03-16 DIAGNOSIS — G894 Chronic pain syndrome: Secondary | ICD-10-CM | POA: Insufficient documentation

## 2022-03-16 DIAGNOSIS — G8929 Other chronic pain: Secondary | ICD-10-CM | POA: Insufficient documentation

## 2022-03-16 DIAGNOSIS — M170 Bilateral primary osteoarthritis of knee: Secondary | ICD-10-CM | POA: Insufficient documentation

## 2022-03-16 DIAGNOSIS — M2550 Pain in unspecified joint: Secondary | ICD-10-CM | POA: Insufficient documentation

## 2022-03-16 NOTE — Patient Outreach (Signed)
Medicaid Managed Care   Nurse Care Manager Note  03/16/2022 Name:  Carrie Franco MRN:  [id number] DOB:  [birth year]  Carrie Franco is an 57 y.o. year old female who is a primary patient of Carrie Juniper, MD.  The [facility] Managed Care Coordination team was consulted for assistance with:    Chronic healthcare management needs, HTN, asthma, emphysema, GERD, osteoarthritis, HLD, depression, chronic pain  Carrie Franco was given information about Medicaid Managed Care Coordination team services today. Carrie Franco Pulse Patient agreed to services and verbal consent obtained.  Engaged with patient by telephone for initial visit in response to provider referral for case management and/or care coordination services.   Assessments/Interventions:  Review of past medical history, allergies, medications, health status, including review of consultants reports, laboratory and other test data, was performed as part of comprehensive evaluation and provision of chronic care management services.  SDOH (Social Determinants of Health) assessments and interventions performed: SDOH Interventions    Flowsheet Row Patient Outreach Telephone from 03/16/2022 in [place] from 03/08/2022 in [place] Admission (Discharged) from 02/19/2022 in [place] Office Visit from 02/14/2022 in [place] Office Visit from 02/01/2022 in [place] Telephone from 12/19/2021 in [place] Interventions        Food Insecurity Interventions -- Intervention Not Indicated Inpatient TOC, Other (Comment)  [food pantry resource list provided] -- -- --  Housing Interventions -- Intervention Not Indicated -- -- -- --  Transportation Interventions -- Intervention Not Indicated Inpatient TOC, Other (Comment)  Science writer provided] -- --  Other (Comment)  [Provided with [employer] medical [phone]]  Utilities Interventions Intervention Not Indicated Intervention Not Indicated -- -- Intervention Not Indicated --  Depression Interventions/Treatment  -- -- -- --  [Provider made aware] --  [Provider made aware] --  Financial Strain Interventions -- Intervention Not Indicated -- -- -- --  Physical Activity Interventions -- Intervention Not Indicated -- -- -- --  Stress Interventions -- Intervention Not Indicated -- -- -- --  Social Connections Interventions -- Intervention Not Indicated -- -- -- --     Care Plan  Allergies  Allergen Reactions   Orange Fruit [Citrus] Hives    Pt describes reaction as big bumps   Orange Oil Hives    Pt describes reaction as big bumps   Other     tomatoes   Clindamycin/Lincomycin Hives and Rash   Meloxicam Itching and Rash   Penicillins Hives, Rash and Other (See Comments)   Medications Reviewed Today     Reviewed by Gayla Medicus, RN (Registered Nurse) on 03/16/22 at 1326  Med List Status: <None>   Medication Order Taking? Sig Documenting Provider Last Dose Status Informant  albuterol (VENTOLIN HFA) 108 (90 Base) MCG/ACT inhaler [id number] No Inhale 1 puff into the lungs daily as needed. Carrie Beard, MD Taking Active Self  amLODipine (NORVASC) 10 MG tablet [id number] No Take 1 tablet (10 mg total) by mouth daily. Carrie Juniper, MD Taking Active Self  aspirin 81 MG chewable tablet [id number] No Chew 1 tablet (81 mg total) by mouth 2 (two) times daily. Magnant, Gerrianne Scale, PA-C Taking Active   benzonatate (TESSALON PERLES) 100 MG capsule [id number]  Take 2 capsules (200 mg total) by mouth 3 (three) times daily as needed for cough. Masters, Scientist, research (physical sciences), DO  Active   cetirizine (ZYRTEC) 10 MG  tablet [id number] No Take 1 tablet by mouth once daily Carrie Schuller, MD Taking Active Self  cyclobenzaprine (FLEXERIL) 5 MG tablet [id number]  Take 1 tablet (5 mg total) by mouth 3  (three) times daily as needed. for muscle spams Magnant, Charles L, PA-C  Active   diclofenac Sodium (VOLTAREN) 1 % GEL [id number] No Apply 2 g topically 4 (four) times daily.  Patient taking differently: Apply 2 g topically 4 (four) times daily as needed (pain).   Izora Ribas, MD Taking Active Self  FLUoxetine (PROZAC) 40 MG capsule [id number] No Take 1 capsule by mouth once daily Carrie Schuller, MD Taking Active Self  fluticasone (FLONASE) 50 MCG/ACT nasal spray [id number] No Place 2 sprays into both nostrils daily as needed for allergies. [provider] Taking Active Self  hydrochlorothiazide (MICROZIDE) 12.5 MG capsule [id number] No Take 1 capsule (12.5 mg total) by mouth daily. Carrie Juniper, MD Taking Active Self  ibuprofen (ADVIL) 800 MG tablet [id number] No Take 1 tablet (800 mg total) by mouth every 6 (six) hours as needed for fever or moderate pain. Carrie Butcher, MD Taking Active   Lido-Capsaicin-Men-Methyl Sal (1ST MEDX-PATCH/ LIDOCAINE) 4-0.025-5-20 % PTCH [id number] No Apply 1 patch topically daily.  Patient taking differently: Place 1 patch onto the skin daily as needed.   Carrie Schuller, MD Taking Active Self  olmesartan (BENICAR) 40 MG tablet [id number] No Take 1 tablet (40 mg total) by mouth daily. Carrie Juniper, MD Taking Active Self  oxyCODONE-acetaminophen (PERCOCET) 5-325 MG tablet [id number] No Take 1 tablet by mouth every 6 (six) hours as needed for severe pain. Izora Ribas, MD Taking Active   pantoprazole (PROTONIX) 40 MG tablet [id number] No Take 1 tablet (40 mg total) by mouth daily. Carrie Juniper, MD Taking Active Self  pregabalin (LYRICA) 100 MG capsule [id number] No Take 1 capsule (100 mg total) by mouth 2 (two) times daily. Carrie Groves, DO Taking Active Self  triamcinolone ointment (KENALOG) 0.1 % [id number] No Apply topically 2 (two) times daily as needed. Izora Ribas, MD Taking Active Self  valACYclovir (VALTREX) 500 MG  tablet [id number] No Take 1 tablet (500 mg total) by mouth daily.  Patient taking differently: Take 500 mg by mouth daily as needed (outbreaks).   Carrie Presto, MD Taking Active Self           Patient Active Problem List   Diagnosis Date Noted   Post-viral cough syndrome 03/09/2022   S/P total knee replacement, 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   Rhomboid muscle strain 02/14/2022   Preoperative clearance 02/01/2022   Thrombocytopenia ([place]) 02/01/2022   Epistaxis 11/15/2021   Thyroid nodule 12/01/2020   Headache 10/02/2020   Cervical radiculopathy 08/12/2020   Aortic atherosclerosis ([place]) 02/17/2020   Emphysema of lung ([place]) 02/17/2020   Neuropathic pain 11/26/2019   Strain of thoracic paraspinal muscles excluding T1 and T2 levels 04/18/2018   Onychomycosis 12/04/2017   Radiculopathy of lumbosacral region 06/24/2017   Depression 01/02/2017   Osteoarthritis 12/05/2015   Arthritis of right knee 12/05/2015   Trigeminal neuralgia of right side of face 11/09/2015   Eczema 04/01/2015   Chronic neck pain 03/08/2015   History of colonic polyps 09/07/2014   Normocytic anemia 09/07/2014   Hyperlipidemia 07/31/2013   GERD (gastroesophageal reflux disease) 03/19/2013   Cerumen impaction 08/07/2012   Obesity (BMI 30.0-34.9) 08/05/2012   Right thigh pain 07/17/2012   Vaginal discharge 06/22/2011   Persistent asthma 02/23/2010   Healthcare maintenance 02/23/2010   Essential hypertension 01/13/2007  History of herpes genitalis 01/02/2006   Allergic sinusitis 01/02/2006   Conditions to be addressed/monitored per PCP order:  Chronic healthcare management needs, HTN, asthma, emphysema, GERD, osteoarthritis, HLD, depression, chronic pain  Care Plan : RN Care Manager Plan of Care  Updates made by Gayla Medicus, RN since 03/16/2022 12:00 AM     Problem: Health Promotion or Disease Self-Management (General Plan of Care)      Long-Range Goal: Chronic Disease Management   Start Date: 03/16/2022  Expected  End Date: 06/16/2022  Priority: High  Note:   Current Barriers:  Knowledge Deficits related to plan of care for management of HTN, asthma, emphysema, GERD, osteoarthritis, HLD, depression, chronic pain  Chronic Disease Management support and education needs related to HTN, asthma, emphysema, GERD, osteoarthritis, HLD, depression, chronic pain   RNCM Clinical Goal(s):  Patient will verbalize understanding of plan for management of  HTN, asthma, emphysema, GERD, osteoarthritis, HLD, depression, chronic pain  as evidenced by patient report verbalize basic understanding of  HTN, asthma, emphysema, GERD, osteoarthritis, HLD, depression, chronic pain  disease process and self health management plan as evidenced by patient report take all medications exactly as prescribed and will call provider for medication related questions as evidenced by patient report demonstrate understanding of rationale for each prescribed medication as evidenced by patient report attend all scheduled medical appointments  as evidenced by patient report demonstrate ongoing adherence to prescribed treatment plan for  as evidenced by patient report and EMR review continue to work with RN Care Manager to address care management and care coordination needs related to HTN, asthma, emphysema, GERD, osteoarthritis, HLD, depression, chronic pain  as evidenced by adherence to CM Team Scheduled appointments through collaboration with RN Care manager, provider, and care team.   Interventions: Inter-disciplinary care team collaboration (see longitudinal plan of care) Evaluation of current treatment plan related to  self management and patient's adherence to plan as established by provider  Asthma: (Status:New goal.) Long Term Goal Advised patient to track and manage Asthma triggers Advised patient to self assesses Asthma action plan zone and make appointment with provider if in the yellow zone for 48 hours without improvement Provided  education about and advised patient to utilize infection prevention strategies to reduce risk of respiratory infection Discussed the importance of adequate rest and management of fatigue with Asthma Assessed social determinant of health barriers   Hyperlipidemia Interventions:  (Status:  New goal.) Long Term Goal Medication review performed; medication list updated in electronic medical record.  Provider established cholesterol goals reviewed Counseled on importance of regular laboratory monitoring as prescribed Reviewed importance of limiting foods high in cholesterol Assessed social determinant of health barriers   Hypertension Interventions:  (Status:  New goal.) Long Term Goal Last practice recorded BP readings:  BP Readings from Last 3 Encounters:  03/08/22 112/74  03/08/22 127/74  02/20/22 106/66  Most recent eGFR/CrCl:  Lab Results  Component Value Date   EGFR 93 11/15/2021    No components found for: "CRCL"  Evaluation of current treatment plan related to hypertension self management and patient's adherence to plan as established by provider Reviewed medications with patient and discussed importance of compliance Discussed plans with patient for ongoing care management follow up and provided patient with direct contact information for care management team Advised patient, providing education and rationale, to monitor blood pressure daily and record, calling PCP for findings outside established parameters Reviewed scheduled/upcoming provider appointments  Assessed social determinant of health barriers  Pain Interventions:  (  Status:  New goal.) Long Term Goal Pain assessment performed Medications reviewed Reviewed provider established plan for pain management Discussed importance of adherence to all scheduled medical appointments Counseled on the importance of reporting any/all new or changed pain symptoms or management strategies to pain management provider Advised patient  to report to care team affect of pain on daily activities Discussed use of relaxation techniques and/or diversional activities to assist with pain reduction (distraction, imagery, relaxation, massage, acupressure, TENS, heat, and cold application Reviewed with patient prescribed pharmacological and nonpharmacological pain relief strategies Assessed social determinant of health barriers  Surgery  (Status: New goal.) Long Term Goal Evaluation of current treatment plan related to knee replacement surgery assessed patient/caregiver understanding of surgical procedure   reviewed post-operative instructions with patient/caregiver reviewed medications with patient and addressed questions reviewed scheduled provider appointments with patient confirmed availability of transportation to all appointments  Patient Goals/Self-Care Activities: Take all medications as prescribed Attend all scheduled provider appointments Call pharmacy for medication refills 3-7 days in advance of running out of medications Perform all self care activities independently  Perform IADL's (shopping, preparing meals, housekeeping, managing finances) independently Call provider office for new concerns or questions   Follow Up Plan:  The patient has been provided with contact information for the care management team and has been advised to call with any health related questions or concerns.  The care management team will reach out to the patient again over the next 30 business  days.    Long-Range Goal: Establish Plan of Care for Chronic Disease Management Needs   Priority: High  Note:   Timeframe:  Long-Range Goal Priority:  High Start Date:   03/16/22                          Expected End Date:  ongoing                     Follow Up Date 04/13/22    - practice safe sex - schedule appointment for vaccines needed due to my age or health - schedule recommended health tests (blood work, mammogram, colonoscopy, pap test) -  schedule and keep appointment for annual check-up    Why is this important?   Screening tests can find diseases early when they are easier to treat.  Your doctor or nurse will talk with you about which tests are important for you.  Getting shots for common diseases like the flu and shingles will help prevent them.     Follow Up:  Patient agrees to Care Plan and Follow-up.  Plan: The Managed Medicaid care management team will reach out to the patient again over the next 30 business  days. and The  Patient has been provided with contact information for the Managed Medicaid care management team and has been advised to call with any health related questions or concerns.  Date/time of next scheduled RN care management/care coordination outreach:  04/13/22 at 1230

## 2022-03-16 NOTE — Patient Instructions (Signed)
Visit Information  Carrie Franco was given information about Medicaid Managed Care team care coordination services and verbally consented to engagement with the [facility] Managed Care team.   Carrie Franco - following are the goals we discussed in your visit today:   Goals Addressed    Timeframe:  Long-Range Goal Priority:  High Start Date:   03/16/22                          Expected End Date:  ongoing                     Follow Up Date 04/13/22    - practice safe sex - schedule appointment for vaccines needed due to my age or health - schedule recommended health tests (blood work, mammogram, colonoscopy, pap test) - schedule and keep appointment for annual check-up    Why is this important?   Screening tests can find diseases early when they are easier to treat.  Your doctor or nurse will talk with you about which tests are important for you.  Getting shots for common diseases like the flu and shingles will help prevent them.    Patient verbalizes understanding of instructions and care plan provided today and agrees to view in [facility]. Active MyChart status and patient understanding of how to access instructions and care plan via MyChart confirmed with patient.     The Managed Medicaid care management team will reach out to the patient again over the next 30 business  days.  The  Patient   has been provided with contact information for the Managed Medicaid care management team and has been advised to call with any health related questions or concerns.   Aida Raider RN, BSN [place]  [facility]ator - Managed Medicaid High Risk [phone]

## 2022-03-19 ENCOUNTER — Encounter (HOSPITAL_BASED_OUTPATIENT_CLINIC_OR_DEPARTMENT_OTHER): Payer: Medicare HMO | Admitting: Registered Nurse

## 2022-03-19 ENCOUNTER — Other Ambulatory Visit: Payer: Self-pay

## 2022-03-19 ENCOUNTER — Encounter: Payer: Self-pay | Admitting: Physical Therapy

## 2022-03-19 ENCOUNTER — Ambulatory Visit: Payer: Medicare HMO | Attending: Surgical | Admitting: Physical Therapy

## 2022-03-19 VITALS — BP 123/76 | HR 88 | Ht 63.0 in | Wt 225.0 lb

## 2022-03-19 DIAGNOSIS — Z79891 Long term (current) use of opiate analgesic: Secondary | ICD-10-CM | POA: Diagnosis not present

## 2022-03-19 DIAGNOSIS — M25561 Pain in right knee: Secondary | ICD-10-CM | POA: Diagnosis not present

## 2022-03-19 DIAGNOSIS — G8929 Other chronic pain: Secondary | ICD-10-CM | POA: Insufficient documentation

## 2022-03-19 DIAGNOSIS — G894 Chronic pain syndrome: Secondary | ICD-10-CM | POA: Diagnosis not present

## 2022-03-19 DIAGNOSIS — M545 Low back pain, unspecified: Secondary | ICD-10-CM

## 2022-03-19 DIAGNOSIS — M2550 Pain in unspecified joint: Secondary | ICD-10-CM | POA: Diagnosis not present

## 2022-03-19 DIAGNOSIS — Z5181 Encounter for therapeutic drug level monitoring: Secondary | ICD-10-CM | POA: Diagnosis not present

## 2022-03-19 DIAGNOSIS — M170 Bilateral primary osteoarthritis of knee: Secondary | ICD-10-CM | POA: Diagnosis not present

## 2022-03-19 DIAGNOSIS — M6281 Muscle weakness (generalized): Secondary | ICD-10-CM | POA: Diagnosis not present

## 2022-03-19 DIAGNOSIS — R600 Localized edema: Secondary | ICD-10-CM | POA: Diagnosis not present

## 2022-03-19 DIAGNOSIS — R2689 Other abnormalities of gait and mobility: Secondary | ICD-10-CM | POA: Diagnosis not present

## 2022-03-19 MED ORDER — OXYCODONE-ACETAMINOPHEN 5-325 MG PO TABS: 1.0000 | ORAL_TABLET | Freq: Four times a day (QID) | ORAL | 0 refills | Status: DC | PRN
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
## 2022-03-19 NOTE — Progress Notes (Signed)
I reviewed the AWV findings with the provider who conducted the visit. I was present in the office suite and immediately available to provide assistance and direction throughout the time the service was provided.  

## 2022-03-21 ENCOUNTER — Other Ambulatory Visit: Payer: Self-pay

## 2022-03-21 ENCOUNTER — Encounter: Payer: Self-pay | Admitting: Physical Therapy

## 2022-03-21 ENCOUNTER — Ambulatory Visit: Payer: Medicare HMO | Admitting: Physical Therapy

## 2022-03-21 DIAGNOSIS — M6281 Muscle weakness (generalized): Secondary | ICD-10-CM

## 2022-03-21 DIAGNOSIS — R2689 Other abnormalities of gait and mobility: Secondary | ICD-10-CM | POA: Diagnosis not present

## 2022-03-21 DIAGNOSIS — G8929 Other chronic pain: Secondary | ICD-10-CM

## 2022-03-21 DIAGNOSIS — R600 Localized edema: Secondary | ICD-10-CM

## 2022-03-21 DIAGNOSIS — M25561 Pain in right knee: Secondary | ICD-10-CM | POA: Diagnosis not present

## 2022-03-21 NOTE — Patient Instructions (Signed)
Access Code: [email or web address] URL: [email or web address] Date: 03/21/2022 Prepared by: Hilda Blades  Exercises - Supine Quad Set on Towel Roll  - 3 x daily - 10 reps - Active Straight Leg Raise with Quad Set  - 3 x daily - 10 reps - Supine Heel Slide with Strap  - 3 x daily - 10 reps - 5 econds hold - Long Sitting Calf Stretch with Strap  - 3 x daily - 3 sets - 5 reps - 20 seconds hold - Seated Knee Flexion AAROM  - 3 x daily - 3 sets - 10 reps - 10 seconds hold - Seated Knee Flexion Extension AROM   - 3 x daily - 10 reps - 5 seconds hold - Seated Knee Extension AROM  - 3 x daily - 10 reps - Standing Terminal Knee Extension with Resistance  - 3 x daily - 10 reps
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
## 2022-03-23 LAB — DRUG TOX MONITOR 1 W/CONF, ORAL FLD
Amphetamines: NEGATIVE ng/mL (ref <10)
Barbiturates: NEGATIVE ng/mL (ref <10)
Benzodiazepines: NEGATIVE ng/mL (ref <0.50)
Buprenorphine: NEGATIVE ng/mL (ref <0.10)
Cocaine: NEGATIVE ng/mL (ref <5.0)
Codeine: NEGATIVE ng/mL (ref <2.5)
Dihydrocodeine: NEGATIVE ng/mL (ref <2.5)
Fentanyl: NEGATIVE ng/mL (ref <0.10)
Heroin Metabolite: NEGATIVE ng/mL (ref <1.0)
Hydrocodone: NEGATIVE ng/mL (ref <2.5)
Hydromorphone: NEGATIVE ng/mL (ref <2.5)
MARIJUANA: NEGATIVE ng/mL (ref <2.5)
MDMA: NEGATIVE ng/mL (ref <10)
Meprobamate: NEGATIVE ng/mL (ref <2.5)
Methadone: NEGATIVE ng/mL (ref <5.0)
Morphine: NEGATIVE ng/mL (ref <2.5)
Nicotine Metabolite: NEGATIVE ng/mL (ref <5.0)
Norhydrocodone: NEGATIVE ng/mL (ref <2.5)
Noroxycodone: 4.1 ng/mL — ABNORMAL HIGH (ref <2.5)
Opiates: POSITIVE ng/mL — AB (ref <2.5)
Oxycodone: 21.8 ng/mL — ABNORMAL HIGH (ref <2.5)
Oxymorphone: NEGATIVE ng/mL (ref <2.5)
Phencyclidine: NEGATIVE ng/mL (ref <10)
Tapentadol: NEGATIVE ng/mL (ref <5.0)
Tramadol: NEGATIVE ng/mL (ref <5.0)
Zolpidem: NEGATIVE ng/mL (ref <5.0)

## 2022-03-23 LAB — DRUG TOX ALC METAB W/CON, ORAL FLD: Alcohol Metabolite: NEGATIVE ng/mL (ref <25)

## 2022-03-24 ENCOUNTER — Encounter: Payer: Self-pay | Admitting: Registered Nurse

## 2022-03-26 ENCOUNTER — Other Ambulatory Visit: Payer: Self-pay

## 2022-03-26 ENCOUNTER — Ambulatory Visit: Payer: Medicare HMO | Admitting: Physical Therapy

## 2022-03-26 ENCOUNTER — Encounter: Payer: Self-pay | Admitting: Physical Therapy

## 2022-03-26 DIAGNOSIS — G8929 Other chronic pain: Secondary | ICD-10-CM | POA: Diagnosis not present

## 2022-03-26 DIAGNOSIS — M25561 Pain in right knee: Secondary | ICD-10-CM | POA: Diagnosis not present

## 2022-03-26 DIAGNOSIS — R600 Localized edema: Secondary | ICD-10-CM

## 2022-03-26 DIAGNOSIS — R2689 Other abnormalities of gait and mobility: Secondary | ICD-10-CM

## 2022-03-26 DIAGNOSIS — M6281 Muscle weakness (generalized): Secondary | ICD-10-CM | POA: Diagnosis not present
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
## 2022-03-26 NOTE — Patient Instructions (Signed)
Access Code: [email or web address] URL: [email or web address] Date: 03/26/2022 Prepared by: Hilda Blades  Exercises - Supine Quad Set on Towel Roll  - 3 x daily - 10 reps - Active Straight Leg Raise with Quad Set  - 3 x daily - 10 reps - Supine Heel Slide with Strap  - 3 x daily - 10 reps - 5 econds hold - Long Sitting Calf Stretch with Strap  - 3 x daily - 3 sets - 5 reps - 20 seconds hold - Prone Knee Flexion AAROM with Overpressure  - 3 x daily - 3 reps - 30 seconds hold - Seated Knee Flexion AAROM  - 3 x daily - 3 sets - 10 reps - 10 seconds hold - Seated Knee Flexion Extension AROM   - 3 x daily - 10 reps - 5 seconds hold - Seated Knee Extension AROM  - 3 x daily - 10 reps - Standing Terminal Knee Extension with Resistance  - 3 x daily - 10 reps
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
## 2022-03-28 ENCOUNTER — Ambulatory Visit: Payer: Medicare HMO | Admitting: Physical Therapy

## 2022-03-28 ENCOUNTER — Encounter: Payer: Self-pay | Admitting: Physical Therapy

## 2022-03-28 ENCOUNTER — Other Ambulatory Visit: Payer: Self-pay

## 2022-03-28 DIAGNOSIS — R600 Localized edema: Secondary | ICD-10-CM | POA: Diagnosis not present

## 2022-03-28 DIAGNOSIS — M6281 Muscle weakness (generalized): Secondary | ICD-10-CM | POA: Diagnosis not present

## 2022-03-28 DIAGNOSIS — M25561 Pain in right knee: Secondary | ICD-10-CM | POA: Diagnosis not present

## 2022-03-28 DIAGNOSIS — G8929 Other chronic pain: Secondary | ICD-10-CM

## 2022-03-28 DIAGNOSIS — R2689 Other abnormalities of gait and mobility: Secondary | ICD-10-CM

## 2022-03-29 ENCOUNTER — Telehealth: Payer: Self-pay | Admitting: *Deleted

## 2022-03-29 NOTE — Telephone Encounter (Signed)
Oral swab drug screen was consistent for prescribed medications.  ?

## 2022-03-30 ENCOUNTER — Ambulatory Visit (INDEPENDENT_AMBULATORY_CARE_PROVIDER_SITE_OTHER): Payer: Medicare HMO | Admitting: Orthopedic Surgery

## 2022-03-30 ENCOUNTER — Encounter: Payer: Self-pay | Admitting: Orthopedic Surgery

## 2022-03-30 DIAGNOSIS — Z96651 Presence of right artificial knee joint: Secondary | ICD-10-CM
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
## 2022-04-02 ENCOUNTER — Other Ambulatory Visit: Payer: Self-pay

## 2022-04-02 ENCOUNTER — Ambulatory Visit: Payer: Medicare HMO | Admitting: Physical Therapy

## 2022-04-02 ENCOUNTER — Encounter: Payer: Self-pay | Admitting: Physical Therapy

## 2022-04-02 DIAGNOSIS — R600 Localized edema: Secondary | ICD-10-CM

## 2022-04-02 DIAGNOSIS — M6281 Muscle weakness (generalized): Secondary | ICD-10-CM | POA: Diagnosis not present

## 2022-04-02 DIAGNOSIS — R2689 Other abnormalities of gait and mobility: Secondary | ICD-10-CM | POA: Diagnosis not present

## 2022-04-02 DIAGNOSIS — M25561 Pain in right knee: Secondary | ICD-10-CM | POA: Diagnosis not present

## 2022-04-02 DIAGNOSIS — G8929 Other chronic pain: Secondary | ICD-10-CM | POA: Diagnosis not present
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
## 2022-04-04 ENCOUNTER — Other Ambulatory Visit: Payer: Self-pay

## 2022-04-04 ENCOUNTER — Ambulatory Visit: Payer: Medicare HMO | Admitting: Physical Therapy

## 2022-04-04 ENCOUNTER — Encounter: Payer: Self-pay | Admitting: Physical Therapy

## 2022-04-04 DIAGNOSIS — G8929 Other chronic pain: Secondary | ICD-10-CM

## 2022-04-04 DIAGNOSIS — M6281 Muscle weakness (generalized): Secondary | ICD-10-CM

## 2022-04-04 DIAGNOSIS — R600 Localized edema: Secondary | ICD-10-CM

## 2022-04-04 DIAGNOSIS — R2689 Other abnormalities of gait and mobility: Secondary | ICD-10-CM

## 2022-04-04 DIAGNOSIS — M25561 Pain in right knee: Secondary | ICD-10-CM | POA: Diagnosis not present
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
## 2022-04-05 ENCOUNTER — Other Ambulatory Visit: Payer: Self-pay | Admitting: Surgical

## 2022-04-06 ENCOUNTER — Other Ambulatory Visit: Payer: Medicare HMO

## 2022-04-06 NOTE — Patient Outreach (Signed)
BSW contacted patient for telephone outreach, patient stated she was busy and asked for a telephone call back.  Mickel Fuchs, BSW, [place] Managed Medicaid Team  [phone]

## 2022-04-09 ENCOUNTER — Other Ambulatory Visit: Payer: Self-pay

## 2022-04-09 ENCOUNTER — Encounter: Payer: Self-pay | Admitting: Physical Therapy

## 2022-04-09 ENCOUNTER — Ambulatory Visit: Payer: Medicare HMO | Admitting: Physical Therapy

## 2022-04-09 DIAGNOSIS — M6281 Muscle weakness (generalized): Secondary | ICD-10-CM | POA: Diagnosis not present

## 2022-04-09 DIAGNOSIS — R2689 Other abnormalities of gait and mobility: Secondary | ICD-10-CM | POA: Diagnosis not present

## 2022-04-09 DIAGNOSIS — M25561 Pain in right knee: Secondary | ICD-10-CM | POA: Diagnosis not present

## 2022-04-09 DIAGNOSIS — G8929 Other chronic pain: Secondary | ICD-10-CM

## 2022-04-09 DIAGNOSIS — R600 Localized edema: Secondary | ICD-10-CM
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
## 2022-04-11 ENCOUNTER — Other Ambulatory Visit: Payer: Self-pay

## 2022-04-11 ENCOUNTER — Encounter: Payer: Self-pay | Admitting: Physical Therapy

## 2022-04-11 ENCOUNTER — Ambulatory Visit: Payer: Medicare HMO | Admitting: Physical Therapy

## 2022-04-11 DIAGNOSIS — M6281 Muscle weakness (generalized): Secondary | ICD-10-CM

## 2022-04-11 DIAGNOSIS — R2689 Other abnormalities of gait and mobility: Secondary | ICD-10-CM | POA: Diagnosis not present

## 2022-04-11 DIAGNOSIS — R600 Localized edema: Secondary | ICD-10-CM

## 2022-04-11 DIAGNOSIS — M25561 Pain in right knee: Secondary | ICD-10-CM | POA: Diagnosis not present

## 2022-04-11 DIAGNOSIS — G8929 Other chronic pain: Secondary | ICD-10-CM
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
## 2022-04-13 ENCOUNTER — Encounter: Payer: Self-pay | Admitting: Obstetrics and Gynecology

## 2022-04-13 ENCOUNTER — Other Ambulatory Visit: Payer: Medicare HMO | Admitting: Obstetrics and Gynecology

## 2022-04-13 NOTE — Patient Outreach (Signed)
Medicaid Managed Care   Nurse Care Manager Note  04/13/2022 Name:  Carrie Franco MRN:  [id number] DOB:  Oct 25, 1965  Carrie Franco is an 57 y.o. year old female who is a primary patient of Carrie Juniper, MD.  The [facility] Managed Care Coordination team was consulted for assistance with:    Chronic healthcare management needs, HTN, asthma, emphysema, GERD, osteoarthritis, HLD, depression, chronic pain, insomnia, polyneuropathy  [name] was given information about Medicaid Managed Care Coordination team services today. Ashima Domingo Pulse Patient agreed to services and verbal consent obtained.  Engaged with patient by telephone for follow up visit in response to provider referral for case management and/or care coordination services.   Assessments/Interventions:  Review of past medical history, allergies, medications, health status, including review of consultants reports, laboratory and other test data, was performed as part of comprehensive evaluation and provision of chronic care management services.  SDOH (Social Determinants of Health) assessments and interventions performed: SDOH Interventions    Flowsheet Row Patient Outreach Telephone from 04/13/2022 in [place] Patient Outreach Telephone from 03/16/2022 in [place] from 03/08/2022 in [place] Admission (Discharged) from 02/19/2022 in [place] Office Visit from 02/14/2022 in [place] Office Visit from 02/01/2022 in [place]  SDOH Interventions        Food Insecurity Interventions -- -- Intervention Not Indicated Inpatient TOC, Other (Comment)  [food pantry resource list provided] -- --  Housing Interventions -- -- Intervention Not Indicated -- -- --  Transportation Interventions -- -- Intervention Not Indicated Inpatient TOC, Other (Comment)   Science writer provided] -- --  Utilities Interventions -- Intervention Not Indicated Intervention Not Indicated -- -- Intervention Not Indicated  Alcohol Usage Interventions Intervention Not Indicated (Score <7) -- -- -- -- --  Depression Interventions/Treatment  -- -- -- -- --  [Provider made aware] --  [Provider made aware]  Financial Strain Interventions -- -- Intervention Not Indicated -- -- --  Physical Activity Interventions Intervention Not Indicated  [patient recovering from knee replacemant surgery-PT 2 times a week] -- Intervention Not Indicated -- -- --  Stress Interventions -- -- Intervention Not Indicated -- -- --  Social Connections Interventions -- -- Intervention Not Indicated -- -- --     Care Plan  Allergies  Allergen Reactions   Orange Fruit [Citrus] Hives    Pt describes reaction as big bumps   Orange Oil Hives    Pt describes reaction as big bumps   Other     tomatoes   Clindamycin/Lincomycin Hives and Rash   Meloxicam Itching and Rash   Penicillins Hives, Rash and Other (See Comments)    Medications Reviewed Today     Reviewed by Gayla Medicus, RN (Registered Nurse) on 04/13/22 at 1101  Med List Status: <None>   Medication Order Taking? Sig Documenting Provider Last Dose Status Informant  albuterol (VENTOLIN HFA) 108 (90 Base) MCG/ACT inhaler [id number] No Inhale 1 puff into the lungs daily as needed. Iona Beard, MD Taking Active Self  amLODipine (NORVASC) 10 MG tablet [id number] No Take 1 tablet (10 mg total) by mouth daily. Carrie Juniper, MD Taking Active Self  aspirin 81 MG chewable tablet [id number] No Chew 1 tablet (81 mg total) by mouth 2 (two) times daily. Magnant, Gerrianne Scale, PA-C Taking Active   benzonatate (TESSALON PERLES) 100 MG capsule [id number] No Take 2  capsules (200 mg total) by mouth 3 (three) times daily as needed for cough. Masters, Scientist, research (physical sciences), DO Taking Active   cetirizine (ZYRTEC) 10 MG tablet [id number] No Take 1 tablet  by mouth once daily Idamae Schuller, MD Taking Active Self  cyclobenzaprine (FLEXERIL) 5 MG tablet [id number]  Take 1 tablet by mouth three times daily as needed for muscle spasm Magnant, Gerrianne Scale, PA-C  Active   diclofenac Sodium (VOLTAREN) 1 % GEL [id number] No Apply 2 g topically 4 (four) times daily.  Patient taking differently: Apply 2 g topically 4 (four) times daily as needed (pain).   Izora Ribas, MD Taking Active Self  FLUoxetine (PROZAC) 40 MG capsule [id number] No Take 1 capsule by mouth once daily Idamae Schuller, MD Taking Active Self  fluticasone (FLONASE) 50 MCG/ACT nasal spray [id number] No Place 2 sprays into both nostrils daily as needed for allergies. [provider] Taking Active Self  hydrochlorothiazide (MICROZIDE) 12.5 MG capsule [id number] No Take 1 capsule (12.5 mg total) by mouth daily. Carrie Juniper, MD Taking Active Self  ibuprofen (ADVIL) 800 MG tablet [id number] No Take 1 tablet (800 mg total) by mouth every 6 (six) hours as needed for fever or moderate pain. Serita Butcher, MD Taking Active   Lido-Capsaicin-Men-Methyl Sal (1ST MEDX-PATCH/ LIDOCAINE) 4-0.025-5-20 % PTCH [id number] No Apply 1 patch topically daily.  Patient taking differently: Place 1 patch onto the skin daily as needed.   Idamae Schuller, MD Taking Active Self  olmesartan (BENICAR) 40 MG tablet [id number] No Take 1 tablet (40 mg total) by mouth daily. Carrie Juniper, MD Taking Active Self  oxyCODONE-acetaminophen (PERCOCET) 5-325 MG tablet [id number]  Take 1 tablet by mouth every 6 (six) hours as needed for severe pain. Bayard Hugger, NP  Active   pantoprazole (PROTONIX) 40 MG tablet [id number] No Take 1 tablet (40 mg total) by mouth daily. Carrie Juniper, MD Taking Active Self  pregabalin (LYRICA) 100 MG capsule [id number] No Take 1 capsule (100 mg total) by mouth 2 (two) times daily. Lucious Groves, DO Taking Active Self  triamcinolone ointment (KENALOG) 0.1 % [id number] No  Apply topically 2 (two) times daily as needed. Izora Ribas, MD Taking Active Self  valACYclovir (VALTREX) 500 MG tablet [id number] No Take 1 tablet (500 mg total) by mouth daily.  Patient taking differently: Take 500 mg by mouth daily as needed (outbreaks).   [name] Presto, MD Taking Active Self           Patient Active Problem List   Diagnosis Date Noted   Post-viral cough syndrome 03/09/2022   S/P total knee replacement, 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   Rhomboid muscle strain 02/14/2022   Preoperative clearance 02/01/2022   Thrombocytopenia ([place]) 02/01/2022   Epistaxis 11/15/2021   Thyroid nodule 12/01/2020   Headache 10/02/2020   Cervical radiculopathy 08/12/2020   Aortic atherosclerosis ([place]) 02/17/2020   Emphysema of lung ([place]) 02/17/2020   Neuropathic pain 11/26/2019   Strain of thoracic paraspinal muscles excluding T1 and T2 levels 04/18/2018   Onychomycosis 12/04/2017   Radiculopathy of lumbosacral region 06/24/2017   Depression 01/02/2017   Osteoarthritis 12/05/2015   Arthritis of right knee 12/05/2015   Trigeminal neuralgia of right side of face 11/09/2015   Eczema 04/01/2015   Chronic neck pain 03/08/2015   History of colonic polyps 09/07/2014   Normocytic anemia 09/07/2014   Hyperlipidemia 07/31/2013   GERD (gastroesophageal reflux disease) 03/19/2013   Cerumen impaction 08/07/2012   Obesity (BMI 30.0-34.9) 08/05/2012   Right thigh pain 07/17/2012  Vaginal discharge 06/22/2011   Persistent asthma 02/23/2010   Healthcare maintenance 02/23/2010   Essential hypertension 01/13/2007   History of herpes genitalis 01/02/2006   Allergic sinusitis 01/02/2006   Conditions to be addressed/monitored per PCP order:  Chronic healthcare management needs, HTN, asthma, emphysema, GERD, osteoarthritis, HLD, depression, chronic pain, insomnia, polyneuropathy  Care Plan : RN Care Manager Plan of Care  Updates made by Gayla Medicus, RN since 04/13/2022 12:00 AM     Problem:  Health Promotion or Disease Self-Management (General Plan of Care)      Long-Range Goal: Chronic Disease Management   Start Date: 03/16/2022  Expected End Date: 06/16/2022  Priority: High  Note:   Current Barriers:  Knowledge Deficits related to plan of care for management of HTN, asthma, emphysema, GERD, osteoarthritis, HLD, depression, chronic pain  Chronic Disease Management support and education needs related to HTN, asthma, emphysema, GERD, osteoarthritis, HLD, depression, chronic pain  04/13/22:  patient attending PT twice a week, not checking blood pressure.  Breathing WNL-uses medications as needed-helpful.  Continues follow up with pain Management.    RNCM Clinical Goal(s):  Patient will verbalize understanding of plan for management of  HTN, asthma, emphysema, GERD, osteoarthritis, HLD, depression, chronic pain  as evidenced by patient report verbalize basic understanding of  HTN, asthma, emphysema, GERD, osteoarthritis, HLD, depression, chronic pain  disease process and self health management plan as evidenced by patient report take all medications exactly as prescribed and will call provider for medication related questions as evidenced by patient report demonstrate understanding of rationale for each prescribed medication as evidenced by patient report attend all scheduled medical appointments  as evidenced by patient report demonstrate ongoing adherence to prescribed treatment plan for  as evidenced by patient report and EMR review continue to work with RN Care Manager to address care management and care coordination needs related to HTN, asthma, emphysema, GERD, osteoarthritis, HLD, depression, chronic pain  as evidenced by adherence to CM Team Scheduled appointments through collaboration with RN Care manager, provider, and care team.   Interventions: Inter-disciplinary care team collaboration (see longitudinal plan of care) Evaluation of current treatment plan related to  self  management and patient's adherence to plan as established by provider  Asthma: (Status:New goal.) Long Term Goal Advised patient to track and manage Asthma triggers Advised patient to self assesses Asthma action plan zone and make appointment with provider if in the yellow zone for 48 hours without improvement Provided education about and advised patient to utilize infection prevention strategies to reduce risk of respiratory infection Discussed the importance of adequate rest and management of fatigue with Asthma Assessed social determinant of health barriers  04/13/22-breathing WNL per patient.  Hyperlipidemia Interventions:  (Status:  New goal.) Long Term Goal Medication review performed; medication list updated in electronic medical record.  Provider established cholesterol goals reviewed Counseled on importance of regular laboratory monitoring as prescribed Reviewed importance of limiting foods high in cholesterol Assessed social determinant of health barriers   Hypertension Interventions:  (Status:  New goal.) Long Term Goal Last practice recorded BP readings:  BP Readings from Last 3 Encounters:  03/08/22 112/74  03/08/22 127/74  02/20/22 106/66  Most recent eGFR/CrCl:  Lab Results  Component Value Date   EGFR 93 11/15/2021    No components found for: "CRCL"  Evaluation of current treatment plan related to hypertension self management and patient's adherence to plan as established by provider Reviewed medications with patient and discussed importance of compliance Discussed plans  with patient for ongoing care management follow up and provided patient with direct contact information for care management team Advised patient, providing education and rationale, to monitor blood pressure daily and record, calling PCP for findings outside established parameters Reviewed scheduled/upcoming provider appointments  Assessed social determinant of health barriers 04/13/22:  patient not  checking BP  Pain Interventions:  (Status:  New goal.) Long Term Goal Pain assessment performed Medications reviewed Reviewed provider established plan for pain management Discussed importance of adherence to all scheduled medical appointments Counseled on the importance of reporting any/all new or changed pain symptoms or management strategies to pain management provider Advised patient to report to care team affect of pain on daily activities Discussed use of relaxation techniques and/or diversional activities to assist with pain reduction (distraction, imagery, relaxation, massage, acupressure, TENS, heat, and cold application Reviewed with patient prescribed pharmacological and nonpharmacological pain relief strategies Assessed social determinant of health barriers 04/13/22:  Patient attending pain management-next appt 4/22.  Surgery  (Status: New goal.) Long Term Goal Evaluation of current treatment plan related to knee replacement surgery assessed patient/caregiver understanding of surgical procedure   reviewed post-operative instructions with patient/caregiver reviewed medications with patient and addressed questions reviewed scheduled provider appointments with patient confirmed availability of transportation to all appointment 04/13/22:  patient has post op appointment 3/15-WNL-continues PT  Patient Goals/Self-Care Activities: Take all medications as prescribed Attend all scheduled provider appointments Call pharmacy for medication refills 3-7 days in advance of running out of medications Perform all self care activities independently  Perform IADL's (shopping, preparing meals, housekeeping, managing finances) independently Call provider office for new concerns or questions   Follow Up Plan:  The patient has been provided with contact information for the care management team and has been advised to call with any health related questions or concerns.  The care management team  will reach out to the patient again over the next 45 business  days.     Long-Range Goal: Establish Plan of Care for Chronic Disease Management Needs   Priority: High  Note:   Timeframe:  Long-Range Goal Priority:  High Start Date:   03/16/22                          Expected End Date:  ongoing                     Follow Up Date 05/29/22    - practice safe sex - schedule appointment for vaccines needed due to my age or health - schedule recommended health tests (blood work, mammogram, colonoscopy, pap test) - schedule and keep appointment for annual check-up    Why is this important?   Screening tests can find diseases early when they are easier to treat.  Your doctor or nurse will talk with you about which tests are important for you.  Getting shots for common diseases like the flu and shingles will help prevent them.  04/13/22:  patient recently seen at [facility] and Pain Management-has follow up at Pain Management 05/07/22.  PT 2 times a week.    Follow Up:  Patient agrees to Care Plan and Follow-up.  Plan: The Managed Medicaid care management team will reach out to the patient again over the next 45 business  days. and The  Patient has been provided with contact information for the Managed Medicaid care management team and has been advised to call with any health related questions or concerns.  Date/time of next scheduled RN care management/care coordination outreach: 05/29/22 at 0900

## 2022-04-13 NOTE — Patient Instructions (Signed)
Visit Information  Carrie Franco was given information about Medicaid Managed Care team care coordination services and verbally consented to engagement with the [facility] Managed Care team.   Carrie Franco - following are the goals we discussed in your visit today:   Goals Addressed    Timeframe:  Long-Range Goal Priority:  High Start Date:   03/16/22                          Expected End Date:  ongoing                     Follow Up Date 05/29/22    - practice safe sex - schedule appointment for vaccines needed due to my age or health - schedule recommended health tests (blood work, mammogram, colonoscopy, pap test) - schedule and keep appointment for annual check-up    Why is this important?   Screening tests can find diseases early when they are easier to treat.  Your doctor or nurse will talk with you about which tests are important for you.  Getting shots for common diseases like the flu and shingles will help prevent them.  04/13/22:  patient recently seen at [facility] and Pain Management-has follow up at Pain Management 05/07/22.  PT 2 times a week.   Patient verbalizes understanding of instructions and care plan provided today and agrees to view in [place]. Active MyChart status and patient understanding of how to access instructions and care plan via MyChart confirmed with patient.     The Managed Medicaid care management team will reach out to the patient again over the next 45 business  days.  The  Patient  has been provided with contact information for the Managed Medicaid care management team and has been advised to call with any health related questions or concerns.   Aida Raider RN, BSN [place]  [facility] [employer] - Managed Medicaid High Risk [phone]

## 2022-04-16 ENCOUNTER — Other Ambulatory Visit: Payer: Self-pay | Admitting: Internal Medicine

## 2022-04-16 ENCOUNTER — Other Ambulatory Visit: Payer: Self-pay | Admitting: Student

## 2022-04-16 DIAGNOSIS — F331 Major depressive disorder, recurrent, moderate: Secondary | ICD-10-CM
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
## 2022-04-17 ENCOUNTER — Ambulatory Visit: Payer: Medicare HMO | Attending: Surgical | Admitting: Physical Therapy

## 2022-04-17 ENCOUNTER — Other Ambulatory Visit: Payer: Medicare HMO

## 2022-04-17 ENCOUNTER — Other Ambulatory Visit: Payer: Self-pay

## 2022-04-17 ENCOUNTER — Encounter: Payer: Self-pay | Admitting: Physical Therapy

## 2022-04-17 DIAGNOSIS — R2689 Other abnormalities of gait and mobility: Secondary | ICD-10-CM | POA: Insufficient documentation

## 2022-04-17 DIAGNOSIS — G8929 Other chronic pain: Secondary | ICD-10-CM | POA: Diagnosis not present

## 2022-04-17 DIAGNOSIS — M25561 Pain in right knee: Secondary | ICD-10-CM | POA: Insufficient documentation

## 2022-04-17 DIAGNOSIS — M6281 Muscle weakness (generalized): Secondary | ICD-10-CM | POA: Diagnosis not present

## 2022-04-17 DIAGNOSIS — R600 Localized edema: Secondary | ICD-10-CM | POA: Insufficient documentation

## 2022-04-17 NOTE — Patient Instructions (Signed)
Visit Information  Carrie Franco was given information about Medicaid Managed Care team care coordination services and verbally consented to engagement with the [facility] Managed Care team.     The  Patient                                              has been provided with contact information for the Managed Medicaid care management team and has been advised to call with any health related questions or concerns.   Marga Melnick, [place] [facility] Social Worker [phone]

## 2022-04-17 NOTE — Patient Outreach (Signed)
Medicaid Managed Care Social Work Note  04/17/2022 Name:  Carrie Franco MRN:  [id number] DOB:  1965-11-07  Carrie Franco is an 57 y.o. year old female who is a primary patient of Carrie Juniper, MD.  The [place] team was consulted for assistance with:  Community Resources   [name] was given information about Medicaid Managed Care Coordination team services today. Carrie Franco Pulse Patient agreed to services and verbal consent obtained.  Engaged with patient  for by telephone forfollow up visit in response to referral for case management and/or care coordination services.   Assessments/Interventions:  Review of past medical history, allergies, medications, health status, including review of consultants reports, laboratory and other test data, was performed as part of comprehensive evaluation and provision of chronic care management services.  SDOH: (Social Determinant of Health) assessments and interventions performed: SDOH Interventions    Flowsheet Row Patient Outreach Telephone from 04/13/2022 in [place] Patient Outreach Telephone from 03/16/2022 in [place] from 03/08/2022 in [place] Admission (Discharged) from 02/19/2022 in [place] Office Visit from 02/14/2022 in [place] Office Visit from 02/01/2022 in [place]  SDOH Interventions        Food Insecurity Interventions -- -- Intervention Not Indicated Inpatient TOC, Other (Comment)  [food pantry resource list provided] -- --  Housing Interventions -- -- Intervention Not Indicated -- -- --  Transportation Interventions -- -- Intervention Not Indicated Inpatient TOC, Other (Comment)  Science writer provided] -- --  Utilities Interventions -- Intervention Not Indicated Intervention Not Indicated -- --  Intervention Not Indicated  Alcohol Usage Interventions Intervention Not Indicated (Score <7) -- -- -- -- --  Depression Interventions/Treatment  -- -- -- -- --  [Provider made aware] --  [Provider made aware]  Financial Strain Interventions -- -- Intervention Not Indicated -- -- --  Physical Activity Interventions Intervention Not Indicated  [patient recovering from knee replacemant surgery-PT 2 times a week] -- Intervention Not Indicated -- -- --  Stress Interventions -- -- Intervention Not Indicated -- -- --  Social Connections Interventions -- -- Intervention Not Indicated -- -- --     BSW completed a telephone outreach with patient, she stated everything is going well. Still in a little pain but doing a lot better. Patient states [name] are no longer needed and she is continuing to go to PT. No resources needed at this time.  Advanced Directives Status:  Not addressed in this encounter.  Care Plan                 Allergies  Allergen Reactions   Orange Fruit [Citrus] Hives    Pt describes reaction as big bumps   Orange Oil Hives    Pt describes reaction as big bumps   Other     tomatoes   Clindamycin/Lincomycin Hives and Rash   Meloxicam Itching and Rash   Penicillins Hives, Rash and Other (See Comments)    Medications Reviewed Today     Reviewed by Carrie Medicus, RN (Registered Nurse) on 04/13/22 at 1101  Med List Status: <None>   Medication Order Taking? Sig Documenting Provider Last Dose Status Informant  albuterol (VENTOLIN HFA) 108 (90 Base) MCG/ACT inhaler [id number] No Inhale 1 puff into the lungs daily as needed. Iona Beard, MD Taking Active Self  amLODipine (NORVASC) 10 MG tablet [id number] No  Take 1 tablet (10 mg total) by mouth daily. Carrie Juniper, MD Taking Active Self  aspirin 81 MG chewable tablet [id number] No Chew 1 tablet (81 mg total) by mouth 2 (two) times daily. Carrie, Gerrianne Scale, PA-C Taking Active   benzonatate (TESSALON PERLES) 100 MG capsule  [id number] No Take 2 capsules (200 mg total) by mouth 3 (three) times daily as needed for cough. Masters, Scientist, research (physical sciences), DO Taking Active   cetirizine (ZYRTEC) 10 MG tablet [id number] No Take 1 tablet by mouth once daily Idamae Schuller, MD Taking Active Self  cyclobenzaprine (FLEXERIL) 5 MG tablet [id number]  Take 1 tablet by mouth three times daily as needed for muscle spasm Carrie, Gerrianne Scale, PA-C  Active   diclofenac Sodium (VOLTAREN) 1 % GEL [id number] No Apply 2 g topically 4 (four) times daily.  Patient taking differently: Apply 2 g topically 4 (four) times daily as needed (pain).   Carrie Ribas, MD Taking Active Self  FLUoxetine (PROZAC) 40 MG capsule [id number] No Take 1 capsule by mouth once daily Idamae Schuller, MD Taking Active Self  fluticasone (FLONASE) 50 MCG/ACT nasal spray [id number] No Place 2 sprays into both nostrils daily as needed for allergies. [provider] Taking Active Self  hydrochlorothiazide (MICROZIDE) 12.5 MG capsule [id number] No Take 1 capsule (12.5 mg total) by mouth daily. Carrie Juniper, MD Taking Active Self  ibuprofen (ADVIL) 800 MG tablet [id number] No Take 1 tablet (800 mg total) by mouth every 6 (six) hours as needed for fever or moderate pain. Carrie Butcher, MD Taking Active   Lido-Capsaicin-Men-Methyl Sal (1ST MEDX-PATCH/ LIDOCAINE) 4-0.025-5-20 % PTCH [id number] No Apply 1 patch topically daily.  Patient taking differently: Place 1 patch onto the skin daily as needed.   Idamae Schuller, MD Taking Active Self  olmesartan (BENICAR) 40 MG tablet [id number] No Take 1 tablet (40 mg total) by mouth daily. Carrie Juniper, MD Taking Active Self  oxyCODONE-acetaminophen (PERCOCET) 5-325 MG tablet [id number]  Take 1 tablet by mouth every 6 (six) hours as needed for severe pain. Carrie Hugger, NP  Active   pantoprazole (PROTONIX) 40 MG tablet [id number] No Take 1 tablet (40 mg total) by mouth daily. Carrie Juniper, MD Taking Active Self  pregabalin  (LYRICA) 100 MG capsule [id number] No Take 1 capsule (100 mg total) by mouth 2 (two) times daily. Carrie Groves, DO Taking Active Self  triamcinolone ointment (KENALOG) 0.1 % [id number] No Apply topically 2 (two) times daily as needed. Carrie Ribas, MD Taking Active Self  valACYclovir (VALTREX) 500 MG tablet [id number] No Take 1 tablet (500 mg total) by mouth daily.  Patient taking differently: Take 500 mg by mouth daily as needed (outbreaks).   [name] Presto, MD Taking Active Self            Patient Active Problem List   Diagnosis Date Noted   Post-viral cough syndrome 03/09/2022   S/P total knee replacement, 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   Rhomboid muscle strain 02/14/2022   Preoperative clearance 02/01/2022   Thrombocytopenia 02/01/2022   Epistaxis 11/15/2021   Thyroid nodule 12/01/2020   Headache 10/02/2020   Cervical radiculopathy 08/12/2020   Aortic atherosclerosis 02/17/2020   Emphysema of lung 02/17/2020   Neuropathic pain 11/26/2019   Strain of thoracic paraspinal muscles excluding T1 and T2 levels 04/18/2018   Onychomycosis 12/04/2017   Radiculopathy of lumbosacral region 06/24/2017   Depression 01/02/2017   Osteoarthritis 12/05/2015   Arthritis of right knee 12/05/2015   Trigeminal neuralgia of right side of face 11/09/2015  Eczema 04/01/2015   Chronic neck pain 03/08/2015   History of colonic polyps 09/07/2014   Normocytic anemia 09/07/2014   Hyperlipidemia 07/31/2013   GERD (gastroesophageal reflux disease) 03/19/2013   Cerumen impaction 08/07/2012   Obesity (BMI 30.0-34.9) 08/05/2012   Right thigh pain 07/17/2012   Vaginal discharge 06/22/2011   Persistent asthma 02/23/2010   Healthcare maintenance 02/23/2010   Essential hypertension 01/13/2007   History of herpes genitalis 01/02/2006   Allergic sinusitis 01/02/2006    Conditions to be addressed/monitored per PCP order:   community resources  There are no care plans that you recently modified to display for  this patient.   Follow up:  Patient agrees to Care Plan and Follow-up.  Plan: The  Patient has been provided with contact information for the Managed Medicaid care management team and has been advised to call with any health related questions or concerns.    Marga Melnick, [place] [facility] Social Worker [phone]

## 2022-04-19 ENCOUNTER — Ambulatory Visit: Payer: Medicare HMO | Admitting: Physical Therapy
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
## 2022-04-20 ENCOUNTER — Encounter: Payer: Self-pay | Admitting: Physical Therapy

## 2022-04-20 ENCOUNTER — Other Ambulatory Visit: Payer: Self-pay

## 2022-04-20 ENCOUNTER — Ambulatory Visit: Payer: Medicare HMO | Admitting: Physical Therapy

## 2022-04-20 DIAGNOSIS — M6281 Muscle weakness (generalized): Secondary | ICD-10-CM | POA: Diagnosis not present

## 2022-04-20 DIAGNOSIS — R2689 Other abnormalities of gait and mobility: Secondary | ICD-10-CM | POA: Diagnosis not present

## 2022-04-20 DIAGNOSIS — G8929 Other chronic pain: Secondary | ICD-10-CM | POA: Diagnosis not present

## 2022-04-20 DIAGNOSIS — M25561 Pain in right knee: Secondary | ICD-10-CM | POA: Diagnosis not present

## 2022-04-20 DIAGNOSIS — R600 Localized edema: Secondary | ICD-10-CM

## 2022-04-23 ENCOUNTER — Ambulatory Visit: Payer: Medicare HMO | Admitting: Physical Therapy

## 2022-04-23 ENCOUNTER — Encounter: Payer: Self-pay | Admitting: Physical Therapy

## 2022-04-23 DIAGNOSIS — R2689 Other abnormalities of gait and mobility: Secondary | ICD-10-CM | POA: Diagnosis not present

## 2022-04-23 DIAGNOSIS — R600 Localized edema: Secondary | ICD-10-CM

## 2022-04-23 DIAGNOSIS — M6281 Muscle weakness (generalized): Secondary | ICD-10-CM | POA: Diagnosis not present

## 2022-04-23 DIAGNOSIS — G8929 Other chronic pain: Secondary | ICD-10-CM

## 2022-04-23 DIAGNOSIS — M25561 Pain in right knee: Secondary | ICD-10-CM | POA: Diagnosis not present
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
## 2022-04-26 ENCOUNTER — Ambulatory Visit: Payer: Medicare HMO | Admitting: Physical Therapy

## 2022-04-26 ENCOUNTER — Other Ambulatory Visit: Payer: Self-pay

## 2022-04-26 ENCOUNTER — Encounter: Payer: Self-pay | Admitting: Physical Therapy

## 2022-04-26 DIAGNOSIS — G8929 Other chronic pain: Secondary | ICD-10-CM

## 2022-04-26 DIAGNOSIS — M6281 Muscle weakness (generalized): Secondary | ICD-10-CM | POA: Diagnosis not present

## 2022-04-26 DIAGNOSIS — R2689 Other abnormalities of gait and mobility: Secondary | ICD-10-CM | POA: Diagnosis not present

## 2022-04-26 DIAGNOSIS — R600 Localized edema: Secondary | ICD-10-CM | POA: Diagnosis not present

## 2022-04-26 DIAGNOSIS — M25561 Pain in right knee: Secondary | ICD-10-CM | POA: Diagnosis not present
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
## 2022-04-27 ENCOUNTER — Ambulatory Visit (INDEPENDENT_AMBULATORY_CARE_PROVIDER_SITE_OTHER): Payer: Medicare HMO | Admitting: Surgical

## 2022-04-27 DIAGNOSIS — Z96651 Presence of right artificial knee joint: Secondary | ICD-10-CM

## 2022-04-29 ENCOUNTER — Encounter: Payer: Self-pay | Admitting: Surgical
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
## 2022-05-03 ENCOUNTER — Encounter: Payer: Self-pay | Admitting: Physical Therapy

## 2022-05-03 ENCOUNTER — Other Ambulatory Visit: Payer: Self-pay

## 2022-05-03 ENCOUNTER — Ambulatory Visit: Payer: Medicare HMO | Admitting: Physical Therapy

## 2022-05-03 ENCOUNTER — Other Ambulatory Visit: Payer: Self-pay | Admitting: Student

## 2022-05-03 DIAGNOSIS — R2689 Other abnormalities of gait and mobility: Secondary | ICD-10-CM | POA: Diagnosis not present

## 2022-05-03 DIAGNOSIS — M6281 Muscle weakness (generalized): Secondary | ICD-10-CM | POA: Diagnosis not present

## 2022-05-03 DIAGNOSIS — G8929 Other chronic pain: Secondary | ICD-10-CM | POA: Diagnosis not present

## 2022-05-03 DIAGNOSIS — M25561 Pain in right knee: Secondary | ICD-10-CM | POA: Diagnosis not present

## 2022-05-03 DIAGNOSIS — R600 Localized edema: Secondary | ICD-10-CM | POA: Diagnosis not present

## 2022-05-07 ENCOUNTER — Encounter: Payer: Medicare HMO | Attending: Physical Medicine and Rehabilitation | Admitting: Registered Nurse

## 2022-05-07 ENCOUNTER — Encounter: Payer: Self-pay | Admitting: Registered Nurse

## 2022-05-07 VITALS — BP 119/76 | HR 70 | Ht 63.0 in | Wt 228.0 lb

## 2022-05-07 DIAGNOSIS — M545 Low back pain, unspecified: Secondary | ICD-10-CM

## 2022-05-07 DIAGNOSIS — G894 Chronic pain syndrome: Secondary | ICD-10-CM

## 2022-05-07 DIAGNOSIS — G8929 Other chronic pain: Secondary | ICD-10-CM | POA: Diagnosis not present

## 2022-05-07 DIAGNOSIS — M1711 Unilateral primary osteoarthritis, right knee: Secondary | ICD-10-CM | POA: Diagnosis not present

## 2022-05-07 DIAGNOSIS — Z5181 Encounter for therapeutic drug level monitoring: Secondary | ICD-10-CM | POA: Diagnosis not present

## 2022-05-07 DIAGNOSIS — M2550 Pain in unspecified joint: Secondary | ICD-10-CM

## 2022-05-07 DIAGNOSIS — G629 Polyneuropathy, unspecified: Secondary | ICD-10-CM

## 2022-05-07 DIAGNOSIS — M7918 Myalgia, other site: Secondary | ICD-10-CM

## 2022-05-07 DIAGNOSIS — Z79891 Long term (current) use of opiate analgesic: Secondary | ICD-10-CM

## 2022-05-07 MED ORDER — OXYCODONE-ACETAMINOPHEN 5-325 MG PO TABS: 1.0000 | ORAL_TABLET | Freq: Four times a day (QID) | ORAL | 0 refills | Status: DC | PRN
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
## 2022-05-09 ENCOUNTER — Ambulatory Visit: Payer: Medicare HMO | Admitting: Physical Therapy

## 2022-05-11 ENCOUNTER — Ambulatory Visit: Payer: Medicare HMO | Admitting: Physical Therapy

## 2022-05-28 ENCOUNTER — Other Ambulatory Visit: Payer: Self-pay

## 2022-05-29 ENCOUNTER — Other Ambulatory Visit: Payer: Medicare HMO | Admitting: Obstetrics and Gynecology

## 2022-05-29 ENCOUNTER — Encounter: Payer: Self-pay | Admitting: Obstetrics and Gynecology

## 2022-05-29 MED ORDER — DICLOFENAC SODIUM 1 % EX GEL: 4.0000 g | Freq: Four times a day (QID) | CUTANEOUS | 2 refills | Status: DC

## 2022-05-29 NOTE — Patient Outreach (Signed)
Medicaid Managed Care   Nurse Care Manager Note  05/29/2022 Name:  Carrie Franco MRN:  [id number] DOB:  07-07-1965  Carrie Franco is an 57 y.o. year old female who is a primary patient of Carrie Crocker, MD.  The [facility] Managed Care Coordination team was consulted for assistance with:    Chronic healthcare management needs, HLD, polyneuropathy, HTN, asthma, emphysema, GERD, osteoarthritis, depression, chronic pain, insomnia, radicilitis  Carrie Franco was given information about Medicaid Managed Care Coordination team services today. Carrie Franco Patient agreed to services and verbal consent obtained.  Engaged with patient by telephone for follow up visit in response to provider referral for case management and/or care coordination services.   Assessments/Interventions:  Review of past medical history, allergies, medications, health status, including review of consultants reports, laboratory and other test data, was performed as part of comprehensive evaluation and provision of chronic care management services.  SDOH (Social Determinants of Health) assessments and interventions performed: SDOH Interventions    Flowsheet Row Patient Outreach Telephone from 05/29/2022 in [place] [facility]PART[facility] Patient Outreach Telephone from 04/13/2022 in [place] POPULATION HEALTH DEPARTMENT Patient Outreach Telephone from 03/16/2022 in [place] POPULATION HEALTH DEPARTMENT Clinical Support from 03/08/2022 in [facility] Admission (Discharged) from 02/19/2022 in [facility] Office Visit from 02/14/2022 in [facility]  SDOH Interventions        Food Insecurity Interventions -- -- -- Intervention Not Indicated Inpatient TOC, Other (Comment)  [food pantry resource list provided] --  Housing Interventions -- -- -- Intervention Not Indicated -- --  Transportation Interventions -- -- -- Intervention Not  Indicated Inpatient TOC, Other (Comment)  Brewing technologist provided] --  Utilities Interventions -- -- Intervention Not Indicated Intervention Not Indicated -- --  Alcohol Usage Interventions -- Intervention Not Indicated (Score <7) -- -- -- --  Depression Interventions/Treatment  -- -- -- -- -- --  [Provider made aware]  Financial Strain Interventions Intervention Not Indicated -- -- Intervention Not Indicated -- --  Physical Activity Interventions -- Intervention Not Indicated  [patient recovering from knee replacemant surgery-PT 2 times a week] -- Intervention Not Indicated -- --  Stress Interventions -- -- -- Intervention Not Indicated -- --  Social Connections Interventions -- -- -- Intervention Not Indicated -- --     Care Plan  Allergies  Allergen Reactions   Hydrocodone-Acetaminophen Other (See Comments)   Orange Oil Hives    Pt describes reaction as big bumps   Other     tomatoes   Citrus Hives and Rash    Pt describes reaction as big bumps   Clindamycin/Lincomycin Hives and Rash   Meloxicam Itching and Rash   Penicillins Hives, Rash and Other (See Comments)   Medications Reviewed Today     Reviewed by [name], RN (Registered Nurse) on 05/29/22 at 1030  Med List Status: <None>   Medication Order Taking? Sig Documenting Provider Last Dose Status Informant  albuterol (VENTOLIN HFA) 108 (90 Base) MCG/ACT inhaler [id number] No Inhale into the lungs. [provider] Taking Active   amLODipine (NORVASC) 10 MG tablet [id number] No Take 1 tablet (10 mg total) by mouth daily. Carrie Crocker, MD Taking Active Self  aspirin 81 MG chewable tablet [id number] No Chew 1 tablet (81 mg total) by mouth 2 (two) times daily. Magnant, Joycie Peek, PA-C Taking Active   atorvastatin (LIPITOR) 10 MG tablet [id number] No Take by mouth. [provider] Taking Active   cetirizine (ZYRTEC) 10 MG tablet [id number] No Take 1 tablet by mouth once daily Carrie Abbot,  MD Taking Active Self  cyclobenzaprine (FLEXERIL) 10 MG tablet [id number] No Take by mouth. [provider] Taking Active   cyclobenzaprine (FLEXERIL) 5 MG tablet [id number] No Take 1 tablet by mouth three times daily as needed for muscle spasm  Patient not taking: Reported on 05/07/2022   [name], PA-C Not Taking Active   diclofenac Sodium (VOLTAREN) 1 % GEL [id number] No Apply topically. [provider] Taking Active   FLUoxetine (PROZAC) 20 MG capsule [id number] No Take by mouth. [provider] Taking Active   FLUoxetine (PROZAC) 40 MG capsule [id number] No Take 1 capsule by mouth once daily [name], MD Taking Active   fluticasone [facility]) 50 MCG/ACT nasal spray [id number] No Place 2 sprays into both nostrils daily as needed for allergies. [provider] Taking Active Self  fluticasone-salmeterol (ADVAIR HFA) 115-21 MCG/ACT inhaler [id number] No Inhale into the lungs. [provider] Taking Active   hydrochlorothiazide (MICROZIDE) 12.5 MG capsule [id number] No Take 1 capsule (12.5 mg total) by mouth daily. Carrie Crocker, MD Taking Active Self  ibuprofen (ADVIL) 800 MG tablet [id number] No Take 1 tablet (800 mg total) by mouth every 6 (six) hours as needed for fever or moderate pain. [name], MD Taking Active   Lido-Capsaicin-Men-Methyl Sal (1ST MEDX-PATCH/ LIDOCAINE) 4-0.025-5-20 % PTCH [id number] No Apply 1 patch topically daily.  Patient taking differently: Place 1 patch onto the skin daily as needed.   Carrie Abbot, MD Taking Active Self  lisinopril (ZESTRIL) 20 MG tablet [id number] No Take by mouth. [provider] Taking Active   olmesartan (BENICAR) 40 MG tablet [id number] No Take 1 tablet (40 mg total) by mouth daily. Carrie Crocker, MD Taking Active Self  oxyCODONE-acetaminophen (PERCOCET) 5-325 MG tablet [id number]  Take 1 tablet by mouth every 6 (six) hours as needed for severe pain. [name],  NP  Active   pantoprazole (PROTONIX) 40 MG tablet [id number] No Take 1 tablet (40 mg total) by mouth daily. Carrie Crocker, MD Taking Active Self  pregabalin (LYRICA) 100 MG capsule [id number] No Take 1 capsule by mouth twice daily [name], MD Taking Active   triamcinolone ointment (KENALOG) 0.1 % [id number] No Apply topically 2 (two) times daily as needed. Horton Chin, MD Taking Active Self  valACYclovir (VALTREX) 500 MG tablet [id number] No Take 1 tablet (500 mg total) by mouth daily.  Patient taking differently: Take 500 mg by mouth daily as needed (outbreaks).   [name], MD Taking Active Self           Patient Active Problem List   Diagnosis Date Noted   Post-viral cough syndrome 03/09/2022   S/P total knee replacement, 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   Rhomboid muscle strain 02/14/2022   Preoperative clearance 02/01/2022   Thrombocytopenia (HCC) 02/01/2022   Epistaxis 11/15/2021   Thyroid nodule 12/01/2020   Headache 10/02/2020   Cervical radiculopathy 08/12/2020   Aortic atherosclerosis (HCC) 02/17/2020   Emphysema of lung (HCC) 02/17/2020   Neuropathic pain 11/26/2019   Strain of thoracic paraspinal muscles excluding T1 and T2 levels 04/18/2018   Onychomycosis 12/04/2017   Radiculopathy of lumbosacral region 06/24/2017   Depression 01/02/2017   Osteoarthritis 12/05/2015   Arthritis of right knee 12/05/2015   Trigeminal neuralgia of right side of face 11/09/2015   Eczema 04/01/2015   Chronic neck pain 03/08/2015   History of colonic polyps 09/07/2014  Normocytic anemia 09/07/2014   Hyperlipidemia 07/31/2013   GERD (gastroesophageal reflux disease) 03/19/2013   Cerumen impaction 08/07/2012   Obesity (BMI 30.0-34.9) 08/05/2012   Right thigh pain 07/17/2012   Vaginal discharge 06/22/2011   Persistent asthma 02/23/2010   Healthcare maintenance 02/23/2010   Essential hypertension 01/13/2007   History of herpes genitalis 01/02/2006   Allergic sinusitis  01/02/2006   Conditions to be addressed/monitored per PCP order:  Chronic healthcare management needs, HLD, polyneuropathy, HTN, asthma, emphysema, GERD, osteoarthritis, depression, chronic pain, insomnia, radicilitis  Care Plan : RN Care Manager Plan of Care  Updates made by [name], RN since 05/29/2022 12:00 AM     Problem: Health Promotion or Disease Self-Management (General Plan of Care)      Long-Range Goal: Chronic Disease Management   Start Date: 03/16/2022  Expected End Date: 08/29/2022  Priority: High  Note:   Current Barriers:  Knowledge Deficits related to plan of care for management of HTN, asthma, emphysema, GERD, osteoarthritis, HLD, depression, chronic pain  Chronic Disease Management support and education needs related to HTN, asthma, emphysema, GERD, osteoarthritis, HLD, depression, chronic pain  05/29/22:  patient Having trouble sleeping and hair falling out since surgery-states she will f/u with her provider-has tried OTC medicine with no help.  BP stable-129/70.  Has completed PT and is doing home exercises every other day for 10 minutes. Continues to be followed at pain management-medicine helpful.  Breathing WNL.  RNCM Clinical Goal(s):  Patient will verbalize understanding of plan for management of  HTN, asthma, emphysema, GERD, osteoarthritis, HLD, depression, chronic pain  as evidenced by patient report verbalize basic understanding of  HTN, asthma, emphysema, GERD, osteoarthritis, HLD, depression, chronic pain  disease process and self health management plan as evidenced by patient report take all medications exactly as prescribed and will call provider for medication related questions as evidenced by patient report demonstrate understanding of rationale for each prescribed medication as evidenced by patient report attend all scheduled medical appointments  as evidenced by patient report demonstrate ongoing adherence to prescribed treatment plan for  as  evidenced by patient report and EMR review continue to work with RN Care Manager to address care management and care coordination needs related to HTN, asthma, emphysema, GERD, osteoarthritis, HLD, depression, chronic pain  as evidenced by adherence to CM Team Scheduled appointments through collaboration with RN Care manager, provider, and care team.   Interventions: Inter-disciplinary care team collaboration (see longitudinal plan of care) Evaluation of current treatment plan related to  self management and patient's adherence to plan as established by provider  Asthma: (Status:New goal.) Long Term Goal Advised patient to track and manage Asthma triggers Advised patient to self assesses Asthma action plan zone and make appointment with provider if in the yellow zone for 48 hours without improvement Provided education about and advised patient to utilize infection prevention strategies to reduce risk of respiratory infection Discussed the importance of adequate rest and management of fatigue with Asthma Assessed social determinant of health barriers  04/13/22-breathing WNL per patient.  Hyperlipidemia Interventions:  (Status:  New goal.) Long Term Goal Medication review performed; medication list updated in electronic medical record.  Provider established cholesterol goals reviewed Counseled on importance of regular laboratory monitoring as prescribed Reviewed importance of limiting foods high in cholesterol Assessed social determinant of health barriers   Hypertension Interventions:  (Status:  New goal.) Long Term Goal Last practice recorded BP readings:  BP Readings from Last 3 Encounters:  03/08/22 112/74  03/08/22 127/74  02/20/22 106/66  05/29/22         129/70  Most recent eGFR/CrCl:  Lab Results  Component Value Date   EGFR 93 11/15/2021    No components found for: "CRCL"  Evaluation of current treatment plan related to hypertension self management and patient's adherence to  plan as established by provider Reviewed medications with patient and discussed importance of compliance Discussed plans with patient for ongoing care management follow up and provided patient with direct contact information for care management team Advised patient, providing education and rationale, to monitor blood pressure daily and record, calling PCP for findings outside established parameters Reviewed scheduled/upcoming provider appointments  Assessed social determinant of health barriers  Pain Interventions:  (Status:  New goal.) Long Term Goal Pain assessment performed Medications reviewed Reviewed provider established plan for pain management Discussed importance of adherence to all scheduled medical appointments Counseled on the importance of reporting any/all new or changed pain symptoms or management strategies to pain management provider Advised patient to report to care team affect of pain on daily activities Discussed use of relaxation techniques and/or diversional activities to assist with pain reduction (distraction, imagery, relaxation, massage, acupressure, TENS, heat, and cold application Reviewed with patient prescribed pharmacological and nonpharmacological pain relief strategies Assessed social determinant of health barriers 04/13/22:  Patient attending pain management-next appt 5/22  Surgery  (Status: New goal.) Long Term Goal Evaluation of current treatment plan related to knee replacement surgery assessed patient/caregiver understanding of surgical procedure   reviewed post-operative instructions with patient/caregiver reviewed medications with patient and addressed questions reviewed scheduled provider appointments with patient confirmed availability of transportation to all appointment 05/29/22:  PT completed-doing home exercies  Patient Goals/Self-Care Activities: Take all medications as prescribed Attend all scheduled provider appointments Call pharmacy for  medication refills 3-7 days in advance of running out of medications Perform all self care activities independently  Perform IADL's (shopping, preparing meals, housekeeping, managing finances) independently Call provider office for new concerns or questions  Patient to f/u with PCP as needed for sleep/hair falling out  Follow Up Plan:  The patient has been provided with contact information for the care management team and has been advised to call with any health related questions or concerns.  The care management team will reach out to the patient again over the next 45 business  days.    Long-Range Goal: Establish Plan of Care for Chronic Disease Management Needs   Priority: High  Note:   Timeframe:  Long-Range Goal Priority:  High Start Date:   03/16/22                          Expected End Date:  ongoing                     Follow Up Date 07/02/22   - practice safe sex - schedule appointment for vaccines needed due to my age or health - schedule recommended health tests (blood work, mammogram, colonoscopy, pap test) - schedule and keep appointment for annual check-up    Why is this important?   Screening tests can find diseases early when they are easier to treat.  Your doctor or nurse will talk with you about which tests are important for you.  Getting shots for common diseases like the flu and shingles will help prevent them.  05/29/22:  Patient currently being followed by pain management and ORTHO-has f/u appts scheduled.  Follow Up:  Patient agrees to Care Plan and Follow-up.  Plan: The Managed Medicaid care management team will reach out to the patient again over the next 30 business  days. and The  Patient has been provided with contact information for the Managed Medicaid care management team and has been advised to call with any health related questions or concerns.  Date/time of next scheduled RN care management/care coordination outreach:  07/02/22 at 315

## 2022-05-29 NOTE — Patient Instructions (Signed)
Hi Ms. Fittipaldi, nice to speak with you today-have a nice day!  Ms. Balram was given information about Medicaid Managed Care team care coordination services and verbally consented to engagement with the [facility] Managed Care team.   Ms. Nickleson - following are the goals we discussed in your visit today:   Goals Addressed    Timeframe:  Long-Range Goal Priority:  High Start Date:   03/16/22                          Expected End Date:  ongoing                     Follow Up Date 07/02/22   - practice safe sex - schedule appointment for vaccines needed due to my age or health - schedule recommended health tests (blood work, mammogram, colonoscopy, pap test) - schedule and keep appointment for annual check-up    Why is this important?   Screening tests can find diseases early when they are easier to treat.  Your doctor or nurse will talk with you about which tests are important for you.  Getting shots for common diseases like the flu and shingles will help prevent them.  05/29/22:  Patient currently being followed by pain management and ORTHO-has f/u appts scheduled.  Patient verbalizes understanding of instructions and care plan provided today and agrees to view in MyChart. Active MyChart status and patient understanding of how to access instructions and care plan via MyChart confirmed with patient.     The Managed Medicaid care management team will reach out to the patient again over the next 30 business  days.  The  Patient  has been provided with contact information for the Managed Medicaid care management team and has been advised to call with any health related questions or concerns.   Following is a copy of your plan of care:  Care Plan : RN Care Manager Plan of Care  Updates made by [name], RN since 05/29/2022 12:00 AM     Problem: Health Promotion or Disease Self-Management (General Plan of Care)      Long-Range Goal: Chronic Disease Management   Start Date: 03/16/2022  Expected  End Date: 08/29/2022  Priority: High  Note:   Current Barriers:  Knowledge Deficits related to plan of care for management of HTN, asthma, emphysema, GERD, osteoarthritis, HLD, depression, chronic pain  Chronic Disease Management support and education needs related to HTN, asthma, emphysema, GERD, osteoarthritis, HLD, depression, chronic pain  05/29/22:  patient Having trouble sleeping and hair falling out since surgery-states she will f/u with her provider-has tried OTC medicine with no help.  BP stable-129/70.  Has completed PT and is doing home exercises every other day for 10 minutes. Continues to be followed at pain management-medicine helpful.  Breathing WNL.  RNCM Clinical Goal(s):  Patient will verbalize understanding of plan for management of  HTN, asthma, emphysema, GERD, osteoarthritis, HLD, depression, chronic pain  as evidenced by patient report verbalize basic understanding of  HTN, asthma, emphysema, GERD, osteoarthritis, HLD, depression, chronic pain  disease process and self health management plan as evidenced by patient report take all medications exactly as prescribed and will call provider for medication related questions as evidenced by patient report demonstrate understanding of rationale for each prescribed medication as evidenced by patient report attend all scheduled medical appointments  as evidenced by patient report demonstrate ongoing adherence to prescribed treatment plan for  as  evidenced by patient report and EMR review continue to work with RN Care Manager to address care management and care coordination needs related to HTN, asthma, emphysema, GERD, osteoarthritis, HLD, depression, chronic pain  as evidenced by adherence to CM Team Scheduled appointments through collaboration with RN Care manager, provider, and care team.   Interventions: Inter-disciplinary care team collaboration (see longitudinal plan of care) Evaluation of current treatment plan related to  self  management and patient's adherence to plan as established by provider  Asthma: (Status:New goal.) Long Term Goal Advised patient to track and manage Asthma triggers Advised patient to self assesses Asthma action plan zone and make appointment with provider if in the yellow zone for 48 hours without improvement Provided education about and advised patient to utilize infection prevention strategies to reduce risk of respiratory infection Discussed the importance of adequate rest and management of fatigue with Asthma Assessed social determinant of health barriers  04/13/22-breathing WNL per patient.  Hyperlipidemia Interventions:  (Status:  New goal.) Long Term Goal Medication review performed; medication list updated in electronic medical record.  Provider established cholesterol goals reviewed Counseled on importance of regular laboratory monitoring as prescribed Reviewed importance of limiting foods high in cholesterol Assessed social determinant of health barriers   Hypertension Interventions:  (Status:  New goal.) Long Term Goal Last practice recorded BP readings:  BP Readings from Last 3 Encounters:  03/08/22 112/74  03/08/22 127/74  02/20/22 106/66  05/29/22         129/70  Most recent eGFR/CrCl:  Lab Results  Component Value Date   EGFR 93 11/15/2021    No components found for: "CRCL"  Evaluation of current treatment plan related to hypertension self management and patient's adherence to plan as established by provider Reviewed medications with patient and discussed importance of compliance Discussed plans with patient for ongoing care management follow up and provided patient with direct contact information for care management team Advised patient, providing education and rationale, to monitor blood pressure daily and record, calling PCP for findings outside established parameters Reviewed scheduled/upcoming provider appointments  Assessed social determinant of health  barriers  Pain Interventions:  (Status:  New goal.) Long Term Goal Pain assessment performed Medications reviewed Reviewed provider established plan for pain management Discussed importance of adherence to all scheduled medical appointments Counseled on the importance of reporting any/all new or changed pain symptoms or management strategies to pain management provider Advised patient to report to care team affect of pain on daily activities Discussed use of relaxation techniques and/or diversional activities to assist with pain reduction (distraction, imagery, relaxation, massage, acupressure, TENS, heat, and cold application Reviewed with patient prescribed pharmacological and nonpharmacological pain relief strategies Assessed social determinant of health barriers 04/13/22:  Patient attending pain management-next appt 5/22  Surgery  (Status: New goal.) Long Term Goal Evaluation of current treatment plan related to knee replacement surgery assessed patient/caregiver understanding of surgical procedure   reviewed post-operative instructions with patient/caregiver reviewed medications with patient and addressed questions reviewed scheduled provider appointments with patient confirmed availability of transportation to all appointment 05/29/22:  PT completed-doing home exercies  Patient Goals/Self-Care Activities: Take all medications as prescribed Attend all scheduled provider appointments Call pharmacy for medication refills 3-7 days in advance of running out of medications Perform all self care activities independently  Perform IADL's (shopping, preparing meals, housekeeping, managing finances) independently Call provider office for new concerns or questions  Patient to f/u with PCP as needed for sleep/hair falling out  Follow Up  Plan:  The patient has been provided with contact information for the care management team and has been advised to call with any health related questions or  concerns.  The care management team will reach out to the patient again over the next 30  business  days.    Kathi Der RN, BSN [place]  [employer] - Managed Medicaid High Risk [phone]

## 2022-06-06 ENCOUNTER — Encounter: Payer: Self-pay | Admitting: Registered Nurse

## 2022-06-06 ENCOUNTER — Encounter: Payer: Medicare HMO | Attending: Physical Medicine and Rehabilitation | Admitting: Registered Nurse

## 2022-06-06 VITALS — BP 136/82 | HR 78 | Ht 63.0 in | Wt 220.6 lb

## 2022-06-06 DIAGNOSIS — Z79891 Long term (current) use of opiate analgesic: Secondary | ICD-10-CM | POA: Diagnosis not present

## 2022-06-06 DIAGNOSIS — G894 Chronic pain syndrome: Secondary | ICD-10-CM | POA: Diagnosis not present

## 2022-06-06 DIAGNOSIS — M25512 Pain in left shoulder: Secondary | ICD-10-CM | POA: Insufficient documentation

## 2022-06-06 DIAGNOSIS — M7918 Myalgia, other site: Secondary | ICD-10-CM | POA: Diagnosis not present

## 2022-06-06 DIAGNOSIS — M1711 Unilateral primary osteoarthritis, right knee: Secondary | ICD-10-CM | POA: Insufficient documentation

## 2022-06-06 DIAGNOSIS — G8929 Other chronic pain: Secondary | ICD-10-CM | POA: Diagnosis not present

## 2022-06-06 DIAGNOSIS — M545 Low back pain, unspecified: Secondary | ICD-10-CM | POA: Insufficient documentation

## 2022-06-06 DIAGNOSIS — Z5181 Encounter for therapeutic drug level monitoring: Secondary | ICD-10-CM | POA: Diagnosis not present

## 2022-06-06 DIAGNOSIS — G629 Polyneuropathy, unspecified: Secondary | ICD-10-CM | POA: Diagnosis not present

## 2022-06-06 MED ORDER — OXYCODONE-ACETAMINOPHEN 5-325 MG PO TABS: 1.0000 | ORAL_TABLET | Freq: Four times a day (QID) | ORAL | 0 refills | Status: DC | PRN
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
## 2022-06-12 ENCOUNTER — Other Ambulatory Visit: Payer: Self-pay | Admitting: Student

## 2022-06-12 DIAGNOSIS — Z1231 Encounter for screening mammogram for malignant neoplasm of breast: Secondary | ICD-10-CM

## 2022-06-13 ENCOUNTER — Ambulatory Visit (INDEPENDENT_AMBULATORY_CARE_PROVIDER_SITE_OTHER): Payer: Medicare HMO | Admitting: Surgical

## 2022-06-13 DIAGNOSIS — Z96651 Presence of right artificial knee joint: Secondary | ICD-10-CM

## 2022-06-14 ENCOUNTER — Encounter: Payer: Medicare HMO | Admitting: Student
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
## 2022-06-16 ENCOUNTER — Encounter: Payer: Self-pay | Admitting: Surgical
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
## 2022-06-19 ENCOUNTER — Telehealth: Payer: Self-pay | Admitting: Physical Medicine and Rehabilitation

## 2022-06-19 NOTE — Telephone Encounter (Signed)
Patient called requesting TP injections with you.  I explained to her that we have to get auth from [employer].  I have scheduled her for Thursday with you.  She wanted you to call her,.

## 2022-06-20 DIAGNOSIS — Z20822 Contact with and (suspected) exposure to covid-19: Secondary | ICD-10-CM | POA: Diagnosis not present

## 2022-06-20 DIAGNOSIS — R059 Cough, unspecified: Secondary | ICD-10-CM | POA: Diagnosis not present

## 2022-06-20 DIAGNOSIS — J209 Acute bronchitis, unspecified: Secondary | ICD-10-CM | POA: Diagnosis not present

## 2022-06-20 DIAGNOSIS — Z6839 Body mass index (BMI) 39.0-39.9, adult: Secondary | ICD-10-CM | POA: Diagnosis not present

## 2022-06-20 DIAGNOSIS — R6883 Chills (without fever): Secondary | ICD-10-CM | POA: Diagnosis not present

## 2022-06-20 DIAGNOSIS — R0602 Shortness of breath: Secondary | ICD-10-CM | POA: Diagnosis not present

## 2022-06-21 ENCOUNTER — Ambulatory Visit: Payer: Medicare HMO | Admitting: Physical Medicine and Rehabilitation

## 2022-06-21 ENCOUNTER — Encounter: Payer: Medicare HMO | Admitting: Internal Medicine

## 2022-06-23 DIAGNOSIS — Z6839 Body mass index (BMI) 39.0-39.9, adult: Secondary | ICD-10-CM | POA: Diagnosis not present

## 2022-06-23 DIAGNOSIS — R0602 Shortness of breath: Secondary | ICD-10-CM | POA: Diagnosis not present

## 2022-06-23 DIAGNOSIS — J209 Acute bronchitis, unspecified: Secondary | ICD-10-CM | POA: Diagnosis not present

## 2022-06-28 ENCOUNTER — Ambulatory Visit (INDEPENDENT_AMBULATORY_CARE_PROVIDER_SITE_OTHER): Payer: Medicare HMO | Admitting: Internal Medicine

## 2022-06-28 ENCOUNTER — Other Ambulatory Visit: Payer: Self-pay

## 2022-06-28 ENCOUNTER — Encounter: Payer: Self-pay | Admitting: Internal Medicine

## 2022-06-28 VITALS — BP 129/72 | HR 72 | Temp 98.3°F | Ht 63.0 in | Wt 219.9 lb

## 2022-06-28 DIAGNOSIS — R0600 Dyspnea, unspecified: Secondary | ICD-10-CM

## 2022-06-28 DIAGNOSIS — R0602 Shortness of breath: Secondary | ICD-10-CM

## 2022-06-28 MED ORDER — FLUTICASONE-SALMETEROL 115-21 MCG/ACT IN AERO: 2.0000 | INHALATION_SPRAY | Freq: Two times a day (BID) | RESPIRATORY_TRACT | 11 refills | Status: DC

## 2022-06-28 NOTE — Patient Instructions (Signed)
Thank you, Ms.Breshae Larina Bras for allowing [place] to provide your care today.   Shortness of breath I am getting studies of your lungs and ultrasound of your heart. Please continue using nebulizer as you have been. Try using flonase daily for the next fews weeks like we reviewed. Please also use advair daily as well as as needed.  If you would like your earwax cleaned please use the drops listed below for a few days before coming in.    I have ordered the following labs for you:  Lab Orders  No laboratory test(s) ordered today     I have ordered the following medication/changed the following medications:   Stop the following medications: Medications Discontinued During This Encounter  Medication Reason   atorvastatin (LIPITOR) 10 MG tablet    FLUoxetine (PROZAC) 20 MG capsule    fluticasone-salmeterol (ADVAIR HFA) 115-21 MCG/ACT inhaler Reorder   albuterol (VENTOLIN HFA) 108 (90 Base) MCG/ACT inhaler      Start the following medications: Meds ordered this encounter  Medications   fluticasone-salmeterol (ADVAIR HFA) 115-21 MCG/ACT inhaler    Sig: Inhale 2 puffs into the lungs 2 (two) times daily.    Dispense:  1 each    Refill:  11     Follow up:  1 month    We look forward to seeing you next time. Please call our clinic at [phone] if you have any questions or concerns. The best time to call is Monday-Friday from 9am-4pm, but there is someone available 24/7. If after hours or the weekend, call the main hospital number and ask for the Internal Medicine Resident On-Call. If you need medication refills, please notify your pharmacy one week in advance and they will send [place] a request.   Thank you for trusting me with your care. Wishing you the best!   Rudene Christians, DO [facility]
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
## 2022-06-29 DIAGNOSIS — R0600 Dyspnea, unspecified: Secondary | ICD-10-CM

## 2022-06-29 HISTORY — DX: Dyspnea, unspecified: R06.00

## 2022-06-29 NOTE — Assessment & Plan Note (Signed)
Patient presents with 2 weeks of dyspnea. She likely had viral infection at beginning of June, her cough has improved but she continues to feel short of breath with activity and at night. She has not been using Advair but does have albuterol inhaler in her purse. Recently has only been using nebulizer every morning as she finds them most effective. She was diagnosed with asthma in her 30s and has never had PFTs done. She smokes cigarettes for about 20 years, starting when she was 14. She denies weight gain or lower extremity edema. No wheezing appreciated on lung exam. Assessment: Differentials include worsening of asthma versus COPD or possibly heart failure.  No concern for exacerbation. I would like to get formal studies of her lungs as well as echo to assess for diastolic dysfunction. P: PFT Echo Refill of fluticasone-salmeterol sent

## 2022-06-29 NOTE — Progress Notes (Signed)
Internal Medicine Clinic Attending  Case discussed with Dr. Masters  At the time of the visit.  We reviewed the resident's history and exam and pertinent patient test results.  I agree with the assessment, diagnosis, and plan of care documented in the resident's note.  

## 2022-07-02 ENCOUNTER — Encounter: Payer: Self-pay | Admitting: Obstetrics and Gynecology

## 2022-07-02 ENCOUNTER — Telehealth: Payer: Self-pay

## 2022-07-02 ENCOUNTER — Other Ambulatory Visit: Payer: Medicare HMO | Admitting: Obstetrics and Gynecology

## 2022-07-02 NOTE — Patient Outreach (Signed)
Medicaid Managed Care   Nurse Care Manager Note  07/02/2022 Name:  Carrie Franco MRN:  [id number] DOB:  08-08-1965  Carrie Franco Carrie Franco is an 57 y.o. year old female who is a primary patient of Carrie Crocker, MD.  The [facility] Managed Care Coordination team was consulted for assistance with:    Chronic healthcare management needs, HLD, HTN, chronic pain, polyneuropathy, asthma, emphysema, GERD, osteoarthritis, depression, radiculitis  Carrie Franco was given information about Medicaid Managed Care Coordination team services today. Carrie Franco Patient agreed to services and verbal consent obtained.  Engaged with patient by telephone for follow up visit in response to provider referral for case management and/or care coordination services.   Assessments/Interventions:  Review of past medical history, allergies, medications, health status, including review of consultants reports, laboratory and other test data, was performed as part of comprehensive evaluation and provision of chronic care management services.  SDOH (Social Determinants of Health) assessments and interventions performed: SDOH Interventions    Flowsheet Row Patient Outreach Telephone from 07/02/2022 in [place] POPULATION HEALTH DEPARTMENT Patient Outreach Telephone from 05/29/2022 in [place] POPULATION HEALTH DEPARTMENT Patient Outreach Telephone from 04/13/2022 in [place] POPULATION HEALTH DEPARTMENT Patient Outreach Telephone from 03/16/2022 in [place] POPULATION HEALTH DEPARTMENT Clinical Support from 03/08/2022 in [facility] Admission (Discharged) from 02/19/2022 in [facility]  SDOH Interventions        Food Insecurity Interventions -- -- -- -- Intervention Not Indicated Inpatient TOC, Other (Comment)  [food pantry resource list provided]  Housing Interventions Intervention Not Indicated -- -- -- Intervention Not Indicated --  Transportation Interventions  Intervention Not Indicated -- -- -- Intervention Not Indicated Inpatient TOC, Other (Comment)  Brewing technologist provided]  Utilities Interventions -- -- -- Intervention Not Indicated Intervention Not Indicated --  Alcohol Usage Interventions -- -- Intervention Not Indicated (Score <7) -- -- --  Financial Strain Interventions -- Intervention Not Indicated -- -- Intervention Not Indicated --  Physical Activity Interventions -- -- Intervention Not Indicated  [patient recovering from knee replacemant surgery-PT 2 times a week] -- Intervention Not Indicated --  Stress Interventions -- -- -- -- Intervention Not Indicated --  Social Connections Interventions -- -- -- -- Intervention Not Indicated --     Care Plan  Allergies  Allergen Reactions   Hydrocodone-Acetaminophen Other (See Comments)   Orange Oil Hives    Pt describes reaction as big bumps   Other     tomatoes   Citrus Hives and Rash    Pt describes reaction as big bumps   Clindamycin/Lincomycin Hives and Rash   Meloxicam Itching and Rash   Penicillins Hives, Rash and Other (See Comments)    Medications Reviewed Today     Reviewed by [name], RN (Registered Nurse) on 07/02/22 at 1513  Med List Status: <None>   Medication Order Taking? Sig Documenting Provider Last Dose Status Informant  amLODipine (NORVASC) 10 MG tablet [id number] No Take 1 tablet (10 mg total) by mouth daily. Carrie Crocker, MD Taking Active Self  aspirin 81 MG chewable tablet [id number] No Chew 1 tablet (81 mg total) by mouth 2 (two) times daily. Magnant, Joycie Peek, PA-C Taking Active   cetirizine (ZYRTEC) 10 MG tablet [id number] No Take 1 tablet by mouth once daily [name], MD Taking Active Self  cyclobenzaprine (FLEXERIL) 10 MG tablet [id number] No Take by mouth. [provider] Taking Active   cyclobenzaprine (FLEXERIL) 5 MG  tablet [id number] No Take 1 tablet by mouth three times daily as needed for muscle spasm  Patient  not taking: Reported on 05/07/2022   Carrie Cotton, PA-C Not Taking Active   diclofenac sodium (VOLTAREN) 1 % GEL [id number] No QID prn to affected area, 2g maximum on one area at a time [provider] Taking Active            Med Note Arnoldo Hooker Feb 12, 2014 12:03 PM) Received from: [facility]  diclofenac Sodium (VOLTAREN) 1 % GEL [id number] No Apply 4 g topically 4 (four) times daily. Carrie Crocker, MD Taking Active   FLUoxetine (PROZAC) 40 MG capsule [id number] No Take 1 capsule by mouth once daily Carrie Franco Simmonds, MD Taking Active   fluticasone [facility]) 50 MCG/ACT nasal spray [id number] No Place 2 sprays into both nostrils daily as needed for allergies. [provider] Taking Active Self  fluticasone-salmeterol (ADVAIR HFA) 244-01 MCG/ACT inhaler [id number]  Inhale 2 puffs into the lungs 2 (two) times daily. Masters, [name], DO  Active   hydrochlorothiazide (MICROZIDE) 12.5 MG capsule [id number] No Take 1 capsule (12.5 mg total) by mouth daily. Carrie Crocker, MD Taking Active Self  ibuprofen (ADVIL) 800 MG tablet [id number] No Take 1 tablet (800 mg total) by mouth every 6 (six) hours as needed for fever or moderate pain. [name], MD Taking Active   Lido-Capsaicin-Men-Methyl Sal (1ST MEDX-PATCH/ LIDOCAINE) 4-0.025-5-20 % PTCH [id number] No Apply 1 patch topically daily.  Patient taking differently: Place 1 patch onto the skin daily as needed.   [name], MD Taking Active Self  lisinopril (ZESTRIL) 20 MG tablet [id number] No Take by mouth. [provider] Taking Active   olmesartan (BENICAR) 40 MG tablet [id number] No Take 1 tablet (40 mg total) by mouth daily. Carrie Crocker, MD Taking Active Self  oxyCODONE-acetaminophen (PERCOCET) 5-325 MG tablet [id number]  Take 1 tablet by mouth every 6 (six) hours as needed for severe pain. [name], NP  Active   pantoprazole (PROTONIX) 40 MG tablet [id number]  No Take 1 tablet (40 mg total) by mouth daily. Carrie Crocker, MD Taking Active Self  pregabalin (LYRICA) 100 MG capsule [id number] No Take 1 capsule by mouth twice daily Carrie Franco Simmonds, MD Taking Active   triamcinolone ointment (KENALOG) 0.1 % [id number] No Apply topically 2 (two) times daily as needed. Horton Chin, MD Taking Active Self  valACYclovir (VALTREX) 500 MG tablet [id number] No Take 1 tablet (500 mg total) by mouth daily.  Patient taking differently: Take 500 mg by mouth daily as needed (outbreaks).   [name], MD Taking Active Self           Patient Active Problem List   Diagnosis Date Noted   Dyspnea 06/29/2022   Post-viral cough syndrome 03/09/2022   S/P total knee replacement, 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   Rhomboid muscle strain 02/14/2022   Preoperative clearance 02/01/2022   Thrombocytopenia (HCC) 02/01/2022   Epistaxis 11/15/2021   Thyroid nodule 12/01/2020   Headache 10/02/2020   Cervical radiculopathy 08/12/2020   Aortic atherosclerosis (HCC) 02/17/2020   Emphysema of lung (HCC) 02/17/2020   Neuropathic pain 11/26/2019   Strain of thoracic paraspinal muscles excluding T1 and T2 levels 04/18/2018   Onychomycosis 12/04/2017   Radiculopathy of lumbosacral region 06/24/2017   Depression 01/02/2017   Osteoarthritis 12/05/2015   Arthritis of right knee 12/05/2015   Trigeminal neuralgia of right side of face 11/09/2015   Eczema 04/01/2015   Chronic  neck pain 03/08/2015   History of colonic polyps 09/07/2014   Normocytic anemia 09/07/2014   Hyperlipidemia 07/31/2013   GERD (gastroesophageal reflux disease) 03/19/2013   Cerumen impaction 08/07/2012   Obesity (BMI 30.0-34.9) 08/05/2012   Right thigh pain 07/17/2012   Vaginal discharge 06/22/2011   Persistent asthma 02/23/2010   Healthcare maintenance 02/23/2010   Essential hypertension 01/13/2007   History of herpes genitalis 01/02/2006   Allergic sinusitis 01/02/2006   Conditions to be  addressed/monitored per PCP order:  Chronic healthcare management needs, HLD, HTN, chronic pain, polyneuropathy, asthma, emphysema, GERD, osteoarthritis, depression, radiculitis  Care Plan : RN Care Manager Plan of Care  Updates made by [name], RN since 07/02/2022 12:00 AM     Problem: Health Promotion or Disease Self-Management (General Plan of Care)      Long-Range Goal: Chronic Disease Management   Start Date: 03/16/2022  Expected End Date: 08/29/2022  Priority: High  Note:   Current Barriers:  Knowledge Deficits related to plan of care for management of HTN, asthma, emphysema, GERD, osteoarthritis, HLD, depression, chronic pain  Chronic Disease Management support and education needs related to HTN, asthma, emphysema, GERD, osteoarthritis, HLD, depression, chronic pain  07/02/22:  Patient doing well-awaiting asthma  medications-breathing is better.  Sleeping better and hair not falling out now.  Continues home exercising with walking and riding indoor bike.  Has ORTHO and pain management appts upcoming.  RNCM Clinical Goal(s):  Patient will verbalize understanding of plan for management of  HTN, asthma, emphysema, GERD, osteoarthritis, HLD, depression, chronic pain  as evidenced by patient report verbalize basic understanding of  HTN, asthma, emphysema, GERD, osteoarthritis, HLD, depression, chronic pain  disease process and self health management plan as evidenced by patient report take all medications exactly as prescribed and will call provider for medication related questions as evidenced by patient report demonstrate understanding of rationale for each prescribed medication as evidenced by patient report attend all scheduled medical appointments  as evidenced by patient report demonstrate ongoing adherence to prescribed treatment plan for  as evidenced by patient report and EMR review continue to work with RN Care Manager to address care management and care coordination needs  related to HTN, asthma, emphysema, GERD, osteoarthritis, HLD, depression, chronic pain  as evidenced by adherence to CM Team Scheduled appointments through collaboration with RN Care manager, provider, and care team.   Interventions: Inter-disciplinary care team collaboration (see longitudinal plan of care) Evaluation of current treatment plan related to  self management and patient's adherence to plan as established by provider  Asthma: (Status:New goal.) Long Term Goal Advised patient to track and manage Asthma triggers Advised patient to self assesses Asthma action plan zone and make appointment with provider if in the yellow zone for 48 hours without improvement Provided education about and advised patient to utilize infection prevention strategies to reduce risk of respiratory infection Discussed the importance of adequate rest and management of fatigue with Asthma Assessed social determinant of health barriers   Hyperlipidemia Interventions:  (Status:  New goal.) Long Term Goal Medication review performed; medication list updated in electronic medical record.  Provider established cholesterol goals reviewed Counseled on importance of regular laboratory monitoring as prescribed Reviewed importance of limiting foods high in cholesterol Assessed social determinant of health barriers   Hypertension Interventions:  (Status:  New goal.) Long Term Goal Last practice recorded BP readings:  BP Readings from Last 3 Encounters:  03/08/22 112/74  03/08/22 127/74  02/20/22 106/66  05/29/22  129/70 07/02/22         132/70  Most recent eGFR/CrCl:  Lab Results  Component Value Date   EGFR 93 11/15/2021    No components found for: "CRCL"  Evaluation of current treatment plan related to hypertension self management and patient's adherence to plan as established by provider Reviewed medications with patient and discussed importance of compliance Discussed plans with patient for ongoing  care management follow up and provided patient with direct contact information for care management team Advised patient, providing education and rationale, to monitor blood pressure daily and record, calling PCP for findings outside established parameters Reviewed scheduled/upcoming provider appointments  Assessed social determinant of health barriers  Pain Interventions:  (Status:  New goal.) Long Term Goal Pain assessment performed Medications reviewed Reviewed provider established plan for pain management Discussed importance of adherence to all scheduled medical appointments Counseled on the importance of reporting any/all new or changed pain symptoms or management strategies to pain management provider Advised patient to report to care team affect of pain on daily activities Discussed use of relaxation techniques and/or diversional activities to assist with pain reduction (distraction, imagery, relaxation, massage, acupressure, TENS, heat, and cold application Reviewed with patient prescribed pharmacological and nonpharmacological pain relief strategies Assessed social determinant of health barriers  Surgery  (Status: New goal.) Long Term Goal Evaluation of current treatment plan related to knee replacement surgery assessed patient/caregiver understanding of surgical procedure   reviewed post-operative instructions with patient/caregiver reviewed medications with patient and addressed questions reviewed scheduled provider appointments with patient confirmed availability of transportation to all appointment  Patient Goals/Self-Care Activities: Take all medications as prescribed Attend all scheduled provider appointments Call pharmacy for medication refills 3-7 days in advance of running out of medications Perform all self care activities independently  Perform IADL's (shopping, preparing meals, housekeeping, managing finances) independently Call provider office for new concerns or  questions  Patient to f/u with PCP as needed for sleep/hair falling out  Follow Up Plan:  The patient has been provided with contact information for the care management team and has been advised to call with any health related questions or concerns.  The care management team will reach out to the patient again over the next 45 business  days.    Long-Range Goal: Establish Plan of Care for Chronic Disease Management Needs   Priority: High  Note:   Timeframe:  Long-Range Goal Priority:  High Start Date:   03/16/22                          Expected End Date:  ongoing                     Follow Up Date 07/08/22   - practice safe sex - schedule appointment for vaccines needed due to my age or health - schedule recommended health tests (blood work, mammogram, colonoscopy, pap test) - schedule and keep appointment for annual check-up    Why is this important?   Screening tests can find diseases early when they are easier to treat.  Your doctor or nurse will talk with you about which tests are important for you.  Getting shots for common diseases like the flu and shingles will help prevent them.  07/02/22:  Recent PCP appt, has upcoming pain management and ORTHO appt   Follow Up:  Patient agrees to Care Plan and Follow-up.  Plan: The Managed Medicaid care management team will reach out  to the patient again over the next 30 business  days. and The  Patient has been provided with contact information for the Managed Medicaid care management team and has been advised to call with any health related questions or concerns.  Date/time of next scheduled RN care management/care coordination outreach: 08/07/22 at 0900

## 2022-07-02 NOTE — Patient Instructions (Signed)
Visit Information  Carrie Franco was given information about Medicaid Managed Care team care coordination services and verbally consented to engagement with the [facility] Managed Care team.   Carrie Franco - following are the goals we discussed in your visit today:   Goals Addressed    Timeframe:  Long-Range Goal Priority:  High Start Date:   03/16/22                          Expected End Date:  ongoing                     Follow Up Date 07/08/22   - practice safe sex - schedule appointment for vaccines needed due to my age or health - schedule recommended health tests (blood work, mammogram, colonoscopy, pap test) - schedule and keep appointment for annual check-up    Why is this important?   Screening tests can find diseases early when they are easier to treat.  Your doctor or nurse will talk with you about which tests are important for you.  Getting shots for common diseases like the flu and shingles will help prevent them.  07/02/22:  Recent PCP appt, has upcoming pain management and ORTHO appt  Patient verbalizes understanding of instructions and care plan provided today and agrees to view in MyChart. Active MyChart status and patient understanding of how to access instructions and care plan via MyChart confirmed with patient.     The Managed Medicaid care management team will reach out to the patient again over the next 30 business  days.  The  Patient                                              has been provided with contact information for the Managed Medicaid care management team and has been advised to call with any health related questions or concerns.   Following is a copy of your plan of care:  Care Plan : RN Care Manager Plan of Care  Updates made by [name], RN since 07/02/2022 12:00 AM     Problem: Health Promotion or Disease Self-Management (General Plan of Care)      Long-Range Goal: Chronic Disease Management   Start Date: 03/16/2022  Expected End Date: 08/29/2022   Priority: High  Note:   Current Barriers:  Knowledge Deficits related to plan of care for management of HTN, asthma, emphysema, GERD, osteoarthritis, HLD, depression, chronic pain  Chronic Disease Management support and education needs related to HTN, asthma, emphysema, GERD, osteoarthritis, HLD, depression, chronic pain  07/02/22:  Patient doing well-awaiting asthma  medications-breathing is better.  Sleeping better and hair not falling out now.  Continues home exercising with walking and riding indoor bike.  Has ORTHO and pain management appts upcoming.  RNCM Clinical Goal(s):  Patient will verbalize understanding of plan for management of  HTN, asthma, emphysema, GERD, osteoarthritis, HLD, depression, chronic pain  as evidenced by patient report verbalize basic understanding of  HTN, asthma, emphysema, GERD, osteoarthritis, HLD, depression, chronic pain  disease process and self health management plan as evidenced by patient report take all medications exactly as prescribed and will call provider for medication related questions as evidenced by patient report demonstrate understanding of rationale for each prescribed medication as evidenced by patient report attend all scheduled medical appointments  as evidenced by patient report demonstrate ongoing adherence to prescribed treatment plan for  as evidenced by patient report and EMR review continue to work with RN Care Manager to address care management and care coordination needs related to HTN, asthma, emphysema, GERD, osteoarthritis, HLD, depression, chronic pain  as evidenced by adherence to CM Team Scheduled appointments through collaboration with RN Care manager, provider, and care team.   Interventions: Inter-disciplinary care team collaboration (see longitudinal plan of care) Evaluation of current treatment plan related to  self management and patient's adherence to plan as established by provider  Asthma: (Status:New goal.) Long Term  Goal Advised patient to track and manage Asthma triggers Advised patient to self assesses Asthma action plan zone and make appointment with provider if in the yellow zone for 48 hours without improvement Provided education about and advised patient to utilize infection prevention strategies to reduce risk of respiratory infection Discussed the importance of adequate rest and management of fatigue with Asthma Assessed social determinant of health barriers   Hyperlipidemia Interventions:  (Status:  New goal.) Long Term Goal Medication review performed; medication list updated in electronic medical record.  Provider established cholesterol goals reviewed Counseled on importance of regular laboratory monitoring as prescribed Reviewed importance of limiting foods high in cholesterol Assessed social determinant of health barriers   Hypertension Interventions:  (Status:  New goal.) Long Term Goal Last practice recorded BP readings:  BP Readings from Last 3 Encounters:  03/08/22 112/74  03/08/22 127/74  02/20/22 106/66  05/29/22         129/70 07/02/22         132/70  Most recent eGFR/CrCl:  Lab Results  Component Value Date   EGFR 93 11/15/2021    No components found for: "CRCL"  Evaluation of current treatment plan related to hypertension self management and patient's adherence to plan as established by provider Reviewed medications with patient and discussed importance of compliance Discussed plans with patient for ongoing care management follow up and provided patient with direct contact information for care management team Advised patient, providing education and rationale, to monitor blood pressure daily and record, calling PCP for findings outside established parameters Reviewed scheduled/upcoming provider appointments  Assessed social determinant of health barriers  Pain Interventions:  (Status:  New goal.) Long Term Goal Pain assessment performed Medications reviewed Reviewed  provider established plan for pain management Discussed importance of adherence to all scheduled medical appointments Counseled on the importance of reporting any/all new or changed pain symptoms or management strategies to pain management provider Advised patient to report to care team affect of pain on daily activities Discussed use of relaxation techniques and/or diversional activities to assist with pain reduction (distraction, imagery, relaxation, massage, acupressure, TENS, heat, and cold application Reviewed with patient prescribed pharmacological and nonpharmacological pain relief strategies Assessed social determinant of health barriers  Surgery  (Status: New goal.) Long Term Goal Evaluation of current treatment plan related to knee replacement surgery assessed patient/caregiver understanding of surgical procedure   reviewed post-operative instructions with patient/caregiver reviewed medications with patient and addressed questions reviewed scheduled provider appointments with patient confirmed availability of transportation to all appointment  Patient Goals/Self-Care Activities: Take all medications as prescribed Attend all scheduled provider appointments Call pharmacy for medication refills 3-7 days in advance of running out of medications Perform all self care activities independently  Perform IADL's (shopping, preparing meals, housekeeping, managing finances) independently Call provider office for new concerns or questions  Patient to f/u with PCP as needed  for sleep/hair falling out  Follow Up Plan:  The patient has been provided with contact information for the care management team and has been advised to call with any health related questions or concerns.  The care management team will reach out to the patient again over the next 45 business  days.    Kathi Der RN, BSN [place]  [employer] - Managed Medicaid High  Risk [phone]

## 2022-07-02 NOTE — Telephone Encounter (Signed)
Pt is requesting a call back ... She stated that her phar mist  informed he rt hat the  is no covered but did not give her a reason as to why ... She is also in need of her cyclobenzaprine (FLEXERIL) 10 MG tablet     Be sent to     [facility] [address], [place] - [address] [phone]

## 2022-07-03 ENCOUNTER — Ambulatory Visit (HOSPITAL_COMMUNITY)
Admission: RE | Admit: 2022-07-03 | Discharge: 2022-07-03 | Disposition: A | Discharge status: Home / Self Care | Payer: Medicare HMO | Source: Ambulatory Visit | Attending: Internal Medicine | Admitting: Internal Medicine

## 2022-07-03 DIAGNOSIS — R0602 Shortness of breath: Secondary | ICD-10-CM

## 2022-07-04 ENCOUNTER — Encounter: Payer: Medicare HMO | Admitting: Registered Nurse

## 2022-07-04 ENCOUNTER — Encounter: Payer: Medicare HMO | Attending: Physical Medicine and Rehabilitation | Admitting: Registered Nurse

## 2022-07-04 ENCOUNTER — Encounter: Payer: Self-pay | Admitting: Registered Nurse

## 2022-07-04 VITALS — BP 124/81 | HR 84 | Ht 63.0 in | Wt 220.0 lb

## 2022-07-04 DIAGNOSIS — Z5181 Encounter for therapeutic drug level monitoring: Secondary | ICD-10-CM | POA: Insufficient documentation

## 2022-07-04 DIAGNOSIS — G8929 Other chronic pain: Secondary | ICD-10-CM | POA: Insufficient documentation

## 2022-07-04 DIAGNOSIS — M1711 Unilateral primary osteoarthritis, right knee: Secondary | ICD-10-CM | POA: Insufficient documentation

## 2022-07-04 DIAGNOSIS — M545 Low back pain, unspecified: Secondary | ICD-10-CM | POA: Insufficient documentation

## 2022-07-04 DIAGNOSIS — Z79891 Long term (current) use of opiate analgesic: Secondary | ICD-10-CM | POA: Insufficient documentation

## 2022-07-04 DIAGNOSIS — G894 Chronic pain syndrome: Secondary | ICD-10-CM | POA: Diagnosis not present

## 2022-07-04 MED ORDER — OXYCODONE-ACETAMINOPHEN 5-325 MG PO TABS: 1.0000 | ORAL_TABLET | Freq: Four times a day (QID) | ORAL | 0 refills | Status: DC | PRN
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
## 2022-07-11 ENCOUNTER — Encounter (HOSPITAL_COMMUNITY): Payer: Self-pay

## 2022-07-11 ENCOUNTER — Inpatient Hospital Stay (HOSPITAL_COMMUNITY): Admission: RE | Admit: 2022-07-11 | Payer: Medicare HMO | Source: Ambulatory Visit

## 2022-07-12 ENCOUNTER — Ambulatory Visit: Payer: Medicare HMO | Admitting: Orthopedic Surgery

## 2022-07-12 ENCOUNTER — Other Ambulatory Visit: Payer: Self-pay

## 2022-07-12 ENCOUNTER — Ambulatory Visit (INDEPENDENT_AMBULATORY_CARE_PROVIDER_SITE_OTHER): Payer: Medicare HMO | Admitting: Student

## 2022-07-12 ENCOUNTER — Other Ambulatory Visit: Payer: Self-pay | Admitting: *Deleted

## 2022-07-12 ENCOUNTER — Encounter: Payer: Self-pay | Admitting: Student

## 2022-07-12 VITALS — BP 131/80 | HR 69 | Temp 98.1°F | Ht 63.0 in | Wt 221.2 lb

## 2022-07-12 DIAGNOSIS — R0600 Dyspnea, unspecified: Secondary | ICD-10-CM

## 2022-07-12 DIAGNOSIS — S29012D Strain of muscle and tendon of back wall of thorax, subsequent encounter: Secondary | ICD-10-CM

## 2022-07-12 DIAGNOSIS — S29012S Strain of muscle and tendon of back wall of thorax, sequela: Secondary | ICD-10-CM

## 2022-07-12 MED ORDER — CYCLOBENZAPRINE HCL 10 MG PO TABS: 10.0000 mg | ORAL_TABLET | Freq: Three times a day (TID) | ORAL | 3 refills | Status: DC | PRN
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
## 2022-07-12 NOTE — Assessment & Plan Note (Signed)
Pt presents for follow up for dyspnea. Since last visit, she has been doing well and has had no symptoms such as PND or daytime asthma symptoms. She has been using Advair and has had no complaints. She is still waiting to have her echo done, and PFTs have been done however unable to view document.   Plan:  - Follow up Echo  - Reach out to pulm to check on document for PFT

## 2022-07-12 NOTE — Assessment & Plan Note (Signed)
Refilled Flexeril.

## 2022-07-12 NOTE — Patient Instructions (Signed)
Thank you so much for coming to the clinic today!   I will have them call to schedule the ultrasound of your heart. Refill has been sent for the muscle relaxer.   If you have any questions please feel free to the call the clinic at anytime at [phone]. It was a pleasure seeing you!  Best, Dr. Thomasene Ripple

## 2022-07-13 ENCOUNTER — Ambulatory Visit: Payer: Medicare HMO

## 2022-07-18 ENCOUNTER — Ambulatory Visit
Admission: RE | Admit: 2022-07-18 | Discharge: 2022-07-18 | Disposition: A | Discharge status: Home / Self Care | Payer: Medicare HMO | Source: Ambulatory Visit | Attending: Internal Medicine | Admitting: Internal Medicine

## 2022-07-18 DIAGNOSIS — Z1231 Encounter for screening mammogram for malignant neoplasm of breast: Secondary | ICD-10-CM | POA: Diagnosis not present

## 2022-07-23 NOTE — Progress Notes (Signed)
Internal Medicine Clinic Attending  Case discussed with Dr. Nooruddin  At the time of the visit.  We reviewed the resident's history and exam and pertinent patient test results.  I agree with the assessment, diagnosis, and plan of care documented in the resident's note.  

## 2022-07-25 ENCOUNTER — Ambulatory Visit (HOSPITAL_COMMUNITY)
Admission: RE | Admit: 2022-07-25 | Discharge: 2022-07-25 | Disposition: A | Discharge status: Home / Self Care | Payer: Medicare HMO | Source: Ambulatory Visit | Attending: Internal Medicine | Admitting: Internal Medicine

## 2022-07-25 DIAGNOSIS — I10 Essential (primary) hypertension: Secondary | ICD-10-CM | POA: Diagnosis not present

## 2022-07-25 DIAGNOSIS — R0602 Shortness of breath: Secondary | ICD-10-CM | POA: Insufficient documentation

## 2022-07-25 DIAGNOSIS — Z87891 Personal history of nicotine dependence: Secondary | ICD-10-CM | POA: Insufficient documentation

## 2022-07-25 DIAGNOSIS — E785 Hyperlipidemia, unspecified: Secondary | ICD-10-CM | POA: Diagnosis not present

## 2022-07-25 LAB — ECHOCARDIOGRAM COMPLETE
Area-P 1/2: 3.65 cm2
Calc EF: 65.9 %
S' Lateral: 3.1 cm
Single Plane A2C EF: 67.4 %
Single Plane A4C EF: 65.1 %

## 2022-07-25 NOTE — Progress Notes (Signed)
Echocardiogram 2D Echocardiogram has been performed.  Augustine Radar 07/25/2022, 3:01 PM

## 2022-07-31 ENCOUNTER — Encounter: Payer: Self-pay | Admitting: Student

## 2022-07-31 ENCOUNTER — Ambulatory Visit (INDEPENDENT_AMBULATORY_CARE_PROVIDER_SITE_OTHER): Payer: Medicare HMO | Admitting: Student

## 2022-07-31 ENCOUNTER — Other Ambulatory Visit: Payer: Self-pay

## 2022-07-31 VITALS — BP 126/67 | HR 75 | Temp 98.1°F | Ht 63.0 in | Wt 223.1 lb

## 2022-07-31 DIAGNOSIS — L309 Dermatitis, unspecified: Secondary | ICD-10-CM

## 2022-07-31 DIAGNOSIS — M79641 Pain in right hand: Secondary | ICD-10-CM

## 2022-07-31 DIAGNOSIS — M7918 Myalgia, other site: Secondary | ICD-10-CM

## 2022-07-31 HISTORY — DX: Pain in right hand: M79.641

## 2022-07-31 HISTORY — DX: Myalgia, other site: M79.18

## 2022-07-31 MED ORDER — TRIAMCINOLONE ACETONIDE 0.1 % EX OINT: TOPICAL_OINTMENT | Freq: Two times a day (BID) | CUTANEOUS | 0 refills | Status: DC | PRN
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
## 2022-07-31 NOTE — Assessment & Plan Note (Signed)
Patient endorses feeling a "lump" in her left upper gluteal region.  She says the lump is slightly tender, but does not cause pain at rest or limit her activities in any way.  Upon palpation of the area no discrete mass or lesion is identifiable.  I believe that the "lump" she is feeling is simply the gluteus max or gluteus medius muscle underneath adipose tissue.  Plan: - Discussed stretching at home, foam rolling to help alleviate muscle tightness. - Return precautions given

## 2022-07-31 NOTE — Patient Instructions (Signed)
Thank you, [name] for allowing [place] to provide your care today. Today we discussed hand pain.    I have ordered the following labs for you:  Lab Orders         Rheumatoid (RA) Factor         CYCLIC CITRUL PEPTIDE ANTIBODY, IGG/IGA         CBC no Diff      Xray of the right hand    I have ordered the following medication/changed the following medications:   Stop the following medications: Medications Discontinued During This Encounter  Medication Reason   doxycycline (ADOXA) 100 MG tablet    triamcinolone ointment (KENALOG) 0.1 % Reorder     Start the following medications: Meds ordered this encounter  Medications   triamcinolone ointment (KENALOG) 0.1 %    Sig: Apply topically 2 (two) times daily as needed.    Dispense:  453.6 g    Refill:  0     Follow up:  1 month     We look forward to seeing you next time. Please call our clinic at [phone] if you have any questions or concerns. The best time to call is Monday-Friday from 9am-4pm, but there is someone available 24/7. If after hours or the weekend, call the main hospital number and ask for the Internal Medicine Resident On-Call. If you need medication refills, please notify your pharmacy one week in advance and they will send [place] a request.   Thank you for trusting me with your care. Wishing you the best!  Lovie Macadamia MD [facility]

## 2022-07-31 NOTE — Assessment & Plan Note (Addendum)
Patient has pain and swelling of the right MCP joints, which started about three days ago. Has not noticed any fevers, chills, joint pain, or rashes. Denies trauma to the area. 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 She has history of carpal tunnel release bilaterally. Has never has these issues before. Has tried voltaren gel, which did not help much. Takes, tylenol, ibuprofen, pregabalin for pain which help only slightly. Denies history of gout, septic arthritis.  On exam, patient has swelling around the MCP joints and pain with palpation of the MCPs. Patient has pain with active and passive extension of the fingers. No erythema appreciated. Favored to reflect tenosynovitis.  Differential would include inflammatory arthropathy such as RA, gout, septic arthritis or OA.  Less concern for gout due to no history of prior gout flare, pain in the foot or toe, history of tophi. Less concern for septic arthritis due to lack of fever, erythema, systemic symptoms. Acute onset, swelling of the MCPs, signs of tenosynovitis less likely associated with OA.  Plan: - RF - Anti-CCP Antibody - CBC - Right Hand Xray -Counseled patient on NSAID use in the setting of lisinopril use.  Patient endorses taking a single dose of ibuprofen a week on average, tries avoid due to potential kidney and gastric issues.

## 2022-08-01 NOTE — Progress Notes (Signed)
Internal Medicine Clinic Attending  I was physically present during the key portions of the resident provided service and participated in the medical decision making of patient's management care. I reviewed pertinent patient test results.  The assessment, diagnosis, and plan were formulated together and I agree with the documentation in the resident's note.  Acute moderate synovitis of the right MCP joints consistent with an inflammatory arthritis, maybe new diagnosis of RA. Will follow up auto antibodies and hand xrays.  Erlinda Hong, MD FACP

## 2022-08-02 ENCOUNTER — Telehealth: Payer: Self-pay | Admitting: Student

## 2022-08-02 LAB — CBC
Hematocrit: 37.8 % (ref 34.0–46.6)
Hemoglobin: 11.8 g/dL (ref 11.1–15.9)
MCH: 27.3 pg (ref 26.6–33.0)
MCHC: 31.2 g/dL — ABNORMAL LOW (ref 31.5–35.7)
MCV: 88 fL (ref 79–97)
Platelets: 282 10*3/uL (ref 150–450)
RBC: 4.32 x10E6/uL (ref 3.77–5.28)
RDW: 15.5 % — ABNORMAL HIGH (ref 11.7–15.4)
WBC: 6.4 10*3/uL (ref 3.4–10.8)

## 2022-08-02 LAB — RHEUMATOID FACTOR: Rheumatoid fact SerPl-aCnc: 10 IU/mL (ref <14.0)

## 2022-08-02 LAB — CYCLIC CITRUL PEPTIDE ANTIBODY, IGG/IGA: Cyclic Citrullin Peptide Ab: 6 units (ref 0–19)

## 2022-08-02 NOTE — Telephone Encounter (Signed)
I spoke with Carrie Franco on the phone. Patient's identity was confirmed using two patient specific identifiers. We discussed her lab work. She has continued swelling of the hand. I recommended to continue to try conservative measures, such as NSAID, Voltaren gel, and compress We discussed getting her hand xray. I will check back in with her regarding her hand once her Xray has been resulted.

## 2022-08-07 ENCOUNTER — Encounter: Payer: Self-pay | Admitting: Obstetrics and Gynecology

## 2022-08-07 ENCOUNTER — Other Ambulatory Visit: Payer: Medicare HMO | Admitting: Obstetrics and Gynecology

## 2022-08-07 NOTE — Patient Outreach (Signed)
Medicaid Managed Care   Nurse Care Manager Note  08/07/2022 Name:  Carrie Franco MRN:  [id number] DOB:  [birth year]  Carrie Franco is an 57 y.o. year old female who is a primary patient of Carrie Crocker, MD.  The [facility] Managed Care Coordination team was consulted for assistance with:    Chronic healthcare management needs, HLD, HTN, chronic pain, polyneuropathy, asthma, emphysema, GERD, osteoarthritis, depression, radiculitis.  Carrie Franco was given information about Medicaid Managed Care Coordination team services today. Carrie Franco Patient agreed to services and verbal consent obtained.  Engaged with patient by telephone for follow up visit in response to provider referral for case management and/or care coordination services.   Assessments/Interventions:  Review of past medical history, allergies, medications, health status, including review of consultants reports, laboratory and other test data, was performed as part of comprehensive evaluation and provision of chronic care management services.  SDOH (Social Determinants of Health) assessments and interventions performed: SDOH Interventions    Flowsheet Row Patient Outreach Telephone from 08/07/2022 in [place] POPULATION HEALTH DEPARTMENT Patient Outreach Telephone from 07/02/2022 in [place] POPULATION HEALTH DEPARTMENT Patient Outreach Telephone from 05/29/2022 in [place] POPULATION HEALTH DEPARTMENT Patient Outreach Telephone from 04/13/2022 in [place] POPULATION HEALTH DEPARTMENT Patient Outreach Telephone from 03/16/2022 in [place] POPULATION HEALTH DEPARTMENT Clinical Support from 03/08/2022 in [facility]  SDOH Interventions        Food Insecurity Interventions -- -- -- -- -- Intervention Not Indicated  Housing Interventions -- Intervention Not Indicated -- -- -- Intervention Not Indicated  Transportation Interventions -- Intervention Not Indicated -- -- -- Intervention Not Indicated   Utilities Interventions -- -- -- -- Intervention Not Indicated Intervention Not Indicated  Alcohol Usage Interventions -- -- -- Intervention Not Indicated (Score <7) -- --  Financial Strain Interventions -- -- Intervention Not Indicated -- -- Intervention Not Indicated  Physical Activity Interventions -- -- -- Intervention Not Indicated  [patient recovering from knee replacemant surgery-PT 2 times a week] -- Intervention Not Indicated  Stress Interventions Intervention Not Indicated -- -- -- -- Intervention Not Indicated  Social Connections Interventions -- -- -- -- -- Intervention Not Indicated  Health Literacy Interventions Intervention Not Indicated -- -- -- -- --     Care Plan  Allergies  Allergen Reactions   Hydrocodone-Acetaminophen Other (See Comments)   Orange Oil Hives    Pt describes reaction as big bumps   Other     tomatoes   Citrus Hives and Rash    Pt describes reaction as big bumps   Clindamycin/Lincomycin Hives and Rash   Meloxicam Itching and Rash   Penicillins Hives, Rash and Other (See Comments)   Medications Reviewed Today     Reviewed by [name], RN (Registered Nurse) on 08/07/22 at [phone]  Med List Status: <None>   Medication Order Taking? Sig Documenting Provider Last Dose Status Informant  amLODipine (NORVASC) 10 MG tablet [id number] No Take 1 tablet (10 mg total) by mouth daily. Carrie Crocker, MD Taking Active Self  aspirin 81 MG chewable tablet [id number] No Chew 1 tablet (81 mg total) by mouth 2 (two) times daily.  Patient not taking: Reported on 07/31/2022   [name], PA-C Not Taking Active   cetirizine (ZYRTEC) 10 MG tablet [id number] No Take 1 tablet by mouth once daily Carrie Abbot, MD Taking Active Self  cyclobenzaprine (FLEXERIL) 10 MG tablet [id number] No Take 1 tablet (10 mg total) by mouth 3 (  three) times daily as needed for muscle spasms. Nooruddin, Jason Fila, MD Taking Active   diclofenac sodium (VOLTAREN) 1 % GEL [id number] No  QID prn to affected area, 2g maximum on one area at a time [provider] Taking Active            Med Note Arnoldo Hooker Feb 12, 2014 12:03 PM) Received from: [facility]  diclofenac Sodium (VOLTAREN) 1 % GEL [id number] No Apply 4 g topically 4 (four) times daily. Carrie Crocker, MD Taking Active   FLUoxetine (PROZAC) 40 MG capsule [id number] No Take 1 capsule by mouth once daily [name], MD Taking Active   fluticasone [facility]) 50 MCG/ACT nasal spray [id number] No Place 2 sprays into both nostrils daily as needed for allergies. [provider] Taking Active Self  fluticasone-salmeterol (ADVAIR HFA) 629-52 MCG/ACT inhaler [id number] No Inhale 2 puffs into the lungs 2 (two) times daily. Masters, Carrie Addison, DO Unknown Active   hydrochlorothiazide (MICROZIDE) 12.5 MG capsule [id number] No Take 1 capsule (12.5 mg total) by mouth daily. Carrie Crocker, MD Taking Active Self  ibuprofen (ADVIL) 800 MG tablet [id number] No Take 1 tablet (800 mg total) by mouth every 6 (six) hours as needed for fever or moderate pain. Carrie Sickles, MD Taking Active   ipratropium-albuterol (DUONEB) 0.5-2.5 (3) MG/3ML SOLN [id number] No Take by nebulization. [provider] Unknown Active   Lido-Capsaicin-Men-Methyl Sal (1ST MEDX-PATCH/ LIDOCAINE) 4-0.025-5-20 % PTCH [id number] No Apply 1 patch topically daily. Carrie Abbot, MD Taking Active Self  lisinopril (ZESTRIL) 20 MG tablet [id number] No Take by mouth. [provider] Taking Active   olmesartan (BENICAR) 40 MG tablet [id number] No Take 1 tablet (40 mg total) by mouth daily.  Patient not taking: Reported on 07/31/2022   Carrie Crocker, MD Not Taking Active Self  oxyCODONE-acetaminophen (PERCOCET) 5-325 MG tablet [id number] No Take 1 tablet by mouth every 6 (six) hours as needed for severe pain. Carrie Bales, NP Taking Active   pantoprazole (PROTONIX) 40 MG tablet [id number] No Take 1  tablet (40 mg total) by mouth daily. Carrie Crocker, MD Taking Active Self  predniSONE (STERAPRED UNI-PAK 21 TAB) 5 MG (21) TBPK tablet [id number] No Take by mouth.  Patient not taking: Reported on 07/04/2022   [provider] Not Taking Active   pregabalin (LYRICA) 100 MG capsule [id number] No Take 1 capsule by mouth twice daily [name], MD Taking Active   triamcinolone ointment (KENALOG) 0.1 % [id number]  Apply topically 2 (two) times daily as needed. Lovie Macadamia, MD  Active   valACYclovir (VALTREX) 500 MG tablet [id number] No Take 1 tablet (500 mg total) by mouth daily.  Patient taking differently: Take 500 mg by mouth daily as needed (outbreaks).   [name], MD Taking Active Self           Patient Active Problem List   Diagnosis Date Noted   Gluteal pain 07/31/2022   Hand pain, right 07/31/2022   Dyspnea 06/29/2022   Post-viral cough syndrome 03/09/2022   S/P total knee replacement, 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   Rhomboid muscle strain 02/14/2022   Preoperative clearance 02/01/2022   Thrombocytopenia (HCC) 02/01/2022   Epistaxis 11/15/2021   Thyroid nodule 12/01/2020   Headache 10/02/2020   Cervical radiculopathy 08/12/2020   Aortic atherosclerosis (HCC) 02/17/2020   Emphysema of lung (HCC) 02/17/2020   Neuropathic pain 11/26/2019   Strain of thoracic paraspinal muscles excluding T1 and T2 levels 04/18/2018   Onychomycosis 12/04/2017   Radiculopathy  of lumbosacral region 06/24/2017   Depression 01/02/2017   Osteoarthritis 12/05/2015   Arthritis of right knee 12/05/2015   Trigeminal neuralgia of right side of face 11/09/2015   Eczema 04/01/2015   Chronic neck pain 03/08/2015   History of colonic polyps 09/07/2014   Normocytic anemia 09/07/2014   Hyperlipidemia 07/31/2013   GERD (gastroesophageal reflux disease) 03/19/2013   Cerumen impaction 08/07/2012   Obesity (BMI 30.0-34.9) 08/05/2012   Right thigh pain 07/17/2012   Vaginal discharge  06/22/2011   Persistent asthma 02/23/2010   Healthcare maintenance 02/23/2010   Essential hypertension 01/13/2007   History of herpes genitalis 01/02/2006   Allergic sinusitis 01/02/2006   Conditions to be addressed/monitored per PCP order:  Chronic healthcare management needs, HLD, HTN, chronic pain, polyneuropathy, asthma, emphysema, GERD, osteoarthritis, depression, radiculitis.  Care Plan : RN Care Manager Plan of Care  Updates made by [name], RN since 08/07/2022 12:00 AM     Problem: Health Promotion or Disease Self-Management (General Plan of Care)      Long-Range Goal: Chronic Disease Management   Start Date: 03/16/2022  Expected End Date: 11/07/2022  Priority: High  Note:   Current Barriers:  Knowledge Deficits related to plan of care for management of HTN, asthma, emphysema, GERD, osteoarthritis, HLD, depression, chronic pain  Chronic Disease Management support and education needs related to HTN, asthma, emphysema, GERD, osteoarthritis, HLD, depression, chronic pain  08/07/22:  right hand pain-to have xray.  No breathing issues.  BP stable.  Continues to be followed by pain management monthly.  RNCM Clinical Goal(s):  Patient will verbalize understanding of plan for management of  HTN, asthma, emphysema, GERD, osteoarthritis, HLD, depression, chronic pain  as evidenced by patient report verbalize basic understanding of  HTN, asthma, emphysema, GERD, osteoarthritis, HLD, depression, chronic pain  disease process and self health management plan as evidenced by patient report take all medications exactly as prescribed and will call provider for medication related questions as evidenced by patient report demonstrate understanding of rationale for each prescribed medication as evidenced by patient report attend all scheduled medical appointments  as evidenced by patient report demonstrate ongoing adherence to prescribed treatment plan for  as evidenced by patient report and  EMR review continue to work with RN Care Manager to address care management and care coordination needs related to HTN, asthma, emphysema, GERD, osteoarthritis, HLD, depression, chronic pain  as evidenced by adherence to CM Team Scheduled appointments through collaboration with RN Care manager, provider, and care team.   Interventions: Inter-disciplinary care team collaboration (see longitudinal plan of care) Evaluation of current treatment plan related to  self management and patient's adherence to plan as established by provider  Asthma: (Status:New goal.) Long Term Goal Advised patient to track and manage Asthma triggers Advised patient to self assesses Asthma action plan zone and make appointment with provider if in the yellow zone for 48 hours without improvement Provided education about and advised patient to utilize infection prevention strategies to reduce risk of respiratory infection Discussed the importance of adequate rest and management of fatigue with Asthma Assessed social determinant of health barriers   Hyperlipidemia Interventions:  (Status:  New goal.) Long Term Goal Medication review performed; medication list updated in electronic medical record.  Provider established cholesterol goals reviewed Counseled on importance of regular laboratory monitoring as prescribed Reviewed importance of limiting foods high in cholesterol Assessed social determinant of health barriers   Hypertension Interventions:  (Status:  New goal.) Long Term Goal Last practice recorded  BP readings:  BP Readings from Last 3 Encounters:           05/29/22         129/70 07/02/22         132/70 08/07/22         128/68  Most recent eGFR/CrCl:  Lab Results  Component Value Date   EGFR 93 11/15/2021    No components found for: "CRCL"  Evaluation of current treatment plan related to hypertension self management and patient's adherence to plan as established by provider Reviewed medications with  patient and discussed importance of compliance Discussed plans with patient for ongoing care management follow up and provided patient with direct contact information for care management team Advised patient, providing education and rationale, to monitor blood pressure daily and record, calling PCP for findings outside established parameters Reviewed scheduled/upcoming provider appointments  Assessed social determinant of health barriers  Pain Interventions:  (Status:  New goal.) Long Term Goal Pain assessment performed Medications reviewed Reviewed provider established plan for pain management Discussed importance of adherence to all scheduled medical appointments Counseled on the importance of reporting any/all new or changed pain symptoms or management strategies to pain management provider Advised patient to report to care team affect of pain on daily activities Discussed use of relaxation techniques and/or diversional activities to assist with pain reduction (distraction, imagery, relaxation, massage, acupressure, TENS, heat, and cold application Reviewed with patient prescribed pharmacological and nonpharmacological pain relief strategies Assessed social determinant of health barriers  Surgery  (Status: New goal.) Long Term Goal Evaluation of current treatment plan related to knee replacement surgery assessed patient/caregiver understanding of surgical procedure   reviewed post-operative instructions with patient/caregiver reviewed medications with patient and addressed questions reviewed scheduled provider appointments with patient confirmed availability of transportation to all appointment  Patient Goals/Self-Care Activities: Take all medications as prescribed Attend all scheduled provider appointments Call pharmacy for medication refills 3-7 days in advance of running out of medications Perform all self care activities independently  Perform IADL's (shopping, preparing meals,  housekeeping, managing finances) independently Call provider office for new concerns or questions  To f/u with provider regarding right hand xray  Follow Up Plan:  The patient has been provided with contact information for the care management team and has been advised to call with any health related questions or concerns.  The care management team will reach out to the patient again over the next 60 business  days.    Long-Range Goal: Establish Plan of Care for Chronic Disease Management Needs   Priority: High  Note:   Timeframe:  Long-Range Goal Priority:  High Start Date:   03/16/22                          Expected End Date:  ongoing                     Follow Up Date 10/08/22   - practice safe sex - schedule appointment for vaccines needed due to my age or health - schedule recommended health tests (blood work, mammogram, colonoscopy, pap test) - schedule and keep appointment for annual check-up    Why is this important?   Screening tests can find diseases early when they are easier to treat.  Your doctor or nurse will talk with you about which tests are important for you.  Getting shots for common diseases like the flu and shingles will help prevent them.  08/07/22:  To follow up on hand xray appt, has monthly appts with pain management   Follow Up:  Patient agrees to Care Plan and Follow-up.  Plan: The Managed Medicaid care management team will reach out to the patient again over the next 30 business  days. and The  Patient has been provided with contact information for the Managed Medicaid care management team and has been advised to call with any health related questions or concerns.  Date/time of next scheduled RN care management/care coordination outreach:  10/08/22 at 0900.

## 2022-08-07 NOTE — Patient Instructions (Signed)
Hi [name], thanks for updating me this morning-have a great day and check with the office about your hand xray.  [name] was given information about Medicaid Managed Care team care coordination services and verbally consented to engagement with the [facility] Managed Care team.   [name]   Goals Addressed    Timeframe:  Long-Range Goal Priority:  High Start Date:   03/16/22                          Expected End Date:  ongoing                     Follow Up Date 10/08/22   - practice safe sex - schedule appointment for vaccines needed due to my age or health - schedule recommended health tests (blood work, mammogram, colonoscopy, pap test) - schedule and keep appointment for annual check-up    Why is this important?   Screening tests can find diseases early when they are easier to treat.  Your doctor or nurse will talk with you about which tests are important for you.  Getting shots for common diseases like the flu and shingles will help prevent them.  08/07/22:  To follow up on hand xray appt, has monthly appts with pain management  Patient verbalizes understanding of instructions and care plan provided today and agrees to view in MyChart. Active MyChart status and patient understanding of how to access instructions and care plan via MyChart confirmed with patient.     The Managed Medicaid care management team will reach out to the patient again over the next 60 business  days.  The  Patient  has been provided with contact information for the Managed Medicaid care management team and has been advised to call with any health related questions or concerns.   Following is a copy of your plan of care:  Care Plan : RN Care Manager Plan of Care  Updates made by [name], RN since 08/07/2022 12:00 AM     Problem: Health Promotion or Disease Self-Management (General Plan of Care)      Long-Range Goal: Chronic Disease  Management   Start Date: 03/16/2022  Expected End Date: 11/07/2022  Priority: High  Note:   Current Barriers:  Knowledge Deficits related to plan of care for management of HTN, asthma, emphysema, GERD, osteoarthritis, HLD, depression, chronic pain  Chronic Disease Management support and education needs related to HTN, asthma, emphysema, GERD, osteoarthritis, HLD, depression, chronic pain  08/07/22:  right hand pain-to have xray.  No breathing issues.  BP stable.  Continues to be followed by pain management monthly.  RNCM Clinical Goal(s):  Patient will verbalize understanding of plan for management of  HTN, asthma, emphysema, GERD, osteoarthritis, HLD, depression, chronic pain  as evidenced by patient report verbalize basic understanding of  HTN, asthma, emphysema, GERD, osteoarthritis, HLD, depression, chronic pain  disease process and self health management plan as evidenced by patient report take all medications exactly as prescribed and will call provider for medication related questions as evidenced by patient report demonstrate understanding of rationale for each prescribed medication as evidenced by patient report attend all scheduled medical appointments  as evidenced by patient report demonstrate ongoing adherence to prescribed treatment plan for  as evidenced by patient report and EMR review continue to work with RN Care Manager to address care management and care  coordination needs related to HTN, asthma, emphysema, GERD, osteoarthritis, HLD, depression, chronic pain  as evidenced by adherence to CM Team Scheduled appointments through collaboration with RN Care manager, provider, and care team.   Interventions: Inter-disciplinary care team collaboration (see longitudinal plan of care) Evaluation of current treatment plan related to  self management and patient's adherence to plan as established by provider  Asthma: (Status:New goal.) Long Term Goal Advised patient to track and manage  Asthma triggers Advised patient to self assesses Asthma action plan zone and make appointment with provider if in the yellow zone for 48 hours without improvement Provided education about and advised patient to utilize infection prevention strategies to reduce risk of respiratory infection Discussed the importance of adequate rest and management of fatigue with Asthma Assessed social determinant of health barriers   Hyperlipidemia Interventions:  (Status:  New goal.) Long Term Goal Medication review performed; medication list updated in electronic medical record.  Provider established cholesterol goals reviewed Counseled on importance of regular laboratory monitoring as prescribed Reviewed importance of limiting foods high in cholesterol Assessed social determinant of health barriers   Hypertension Interventions:  (Status:  New goal.) Long Term Goal Last practice recorded BP readings:  BP Readings from Last 3 Encounters:           05/29/22         129/70 07/02/22         132/70 08/07/22         128/68  Most recent eGFR/CrCl:  Lab Results  Component Value Date   EGFR 93 11/15/2021    No components found for: "CRCL"  Evaluation of current treatment plan related to hypertension self management and patient's adherence to plan as established by provider Reviewed medications with patient and discussed importance of compliance Discussed plans with patient for ongoing care management follow up and provided patient with direct contact information for care management team Advised patient, providing education and rationale, to monitor blood pressure daily and record, calling PCP for findings outside established parameters Reviewed scheduled/upcoming provider appointments  Assessed social determinant of health barriers  Pain Interventions:  (Status:  New goal.) Long Term Goal Pain assessment performed Medications reviewed Reviewed provider established plan for pain management Discussed  importance of adherence to all scheduled medical appointments Counseled on the importance of reporting any/all new or changed pain symptoms or management strategies to pain management provider Advised patient to report to care team affect of pain on daily activities Discussed use of relaxation techniques and/or diversional activities to assist with pain reduction (distraction, imagery, relaxation, massage, acupressure, TENS, heat, and cold application Reviewed with patient prescribed pharmacological and nonpharmacological pain relief strategies Assessed social determinant of health barriers  Surgery  (Status: New goal.) Long Term Goal Evaluation of current treatment plan related to knee replacement surgery assessed patient/caregiver understanding of surgical procedure   reviewed post-operative instructions with patient/caregiver reviewed medications with patient and addressed questions reviewed scheduled provider appointments with patient confirmed availability of transportation to all appointment  Patient Goals/Self-Care Activities: Take all medications as prescribed Attend all scheduled provider appointments Call pharmacy for medication refills 3-7 days in advance of running out of medications Perform all self care activities independently  Perform IADL's (shopping, preparing meals, housekeeping, managing finances) independently Call provider office for new concerns or questions  To f/u with provider regarding right hand xray  Follow Up Plan:  The patient has been provided with contact information for the care management team and has been advised to call  with any health related questions or concerns.  The care management team will reach out to the patient again over the next 60 business  days.    Kathi Der RN, BSN [employer]  [employer], production - Managed Medicaid High Risk [phone]

## 2022-08-14 ENCOUNTER — Encounter: Payer: Medicare HMO | Attending: Physical Medicine and Rehabilitation | Admitting: Registered Nurse

## 2022-08-14 ENCOUNTER — Encounter: Payer: Self-pay | Admitting: Registered Nurse

## 2022-08-14 ENCOUNTER — Other Ambulatory Visit: Payer: Self-pay | Admitting: Student

## 2022-08-14 VITALS — BP 129/85 | HR 87 | Ht 63.0 in | Wt 224.0 lb

## 2022-08-14 DIAGNOSIS — G8929 Other chronic pain: Secondary | ICD-10-CM | POA: Diagnosis not present

## 2022-08-14 DIAGNOSIS — Z79891 Long term (current) use of opiate analgesic: Secondary | ICD-10-CM | POA: Diagnosis not present

## 2022-08-14 DIAGNOSIS — G894 Chronic pain syndrome: Secondary | ICD-10-CM | POA: Diagnosis not present

## 2022-08-14 DIAGNOSIS — M1711 Unilateral primary osteoarthritis, right knee: Secondary | ICD-10-CM

## 2022-08-14 DIAGNOSIS — M545 Low back pain, unspecified: Secondary | ICD-10-CM | POA: Diagnosis not present

## 2022-08-14 DIAGNOSIS — Z5181 Encounter for therapeutic drug level monitoring: Secondary | ICD-10-CM
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
## 2022-08-15 ENCOUNTER — Ambulatory Visit: Payer: Medicare HMO | Admitting: Registered Nurse

## 2022-09-03 ENCOUNTER — Telehealth: Payer: Self-pay | Admitting: Student

## 2022-09-03 NOTE — Telephone Encounter (Signed)
Pt requesting a call back. Pt reporting having Dry Mouth x 2 weeks.

## 2022-09-03 NOTE — Telephone Encounter (Signed)
Return pt's call c/o dry moth x 1 week. "Dry as a desert".Stated she has not started any medication; she has been drinking fluid, ice.

## 2022-09-06 ENCOUNTER — Encounter: Payer: Medicare HMO | Attending: Physical Medicine and Rehabilitation | Admitting: Registered Nurse

## 2022-09-06 ENCOUNTER — Encounter: Payer: Self-pay | Admitting: Registered Nurse

## 2022-09-06 VITALS — BP 149/89 | HR 92 | Wt 228.4 lb

## 2022-09-06 DIAGNOSIS — Z79891 Long term (current) use of opiate analgesic: Secondary | ICD-10-CM | POA: Diagnosis not present

## 2022-09-06 DIAGNOSIS — M1711 Unilateral primary osteoarthritis, right knee: Secondary | ICD-10-CM

## 2022-09-06 DIAGNOSIS — G629 Polyneuropathy, unspecified: Secondary | ICD-10-CM | POA: Diagnosis not present

## 2022-09-06 DIAGNOSIS — G894 Chronic pain syndrome: Secondary | ICD-10-CM

## 2022-09-06 DIAGNOSIS — G8929 Other chronic pain: Secondary | ICD-10-CM

## 2022-09-06 DIAGNOSIS — Z5181 Encounter for therapeutic drug level monitoring: Secondary | ICD-10-CM | POA: Diagnosis not present

## 2022-09-06 DIAGNOSIS — M545 Low back pain, unspecified: Secondary | ICD-10-CM | POA: Diagnosis not present

## 2022-09-06 MED ORDER — OXYCODONE-ACETAMINOPHEN 5-325 MG PO TABS: 1.0000 | ORAL_TABLET | Freq: Four times a day (QID) | ORAL | 0 refills | Status: DC | PRN
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
## 2022-09-07 ENCOUNTER — Other Ambulatory Visit: Payer: Self-pay | Admitting: Student

## 2022-09-07 DIAGNOSIS — L309 Dermatitis, unspecified: Secondary | ICD-10-CM

## 2022-09-11 ENCOUNTER — Ambulatory Visit
Admission: RE | Admit: 2022-09-11 | Discharge: 2022-09-11 | Disposition: A | Discharge status: Home / Self Care | Payer: Medicare HMO | Source: Ambulatory Visit | Attending: Registered Nurse | Admitting: Registered Nurse

## 2022-09-11 ENCOUNTER — Ambulatory Visit
Admission: RE | Admit: 2022-09-11 | Discharge: 2022-09-11 | Disposition: A | Discharge status: Home / Self Care | Payer: Medicare HMO | Source: Ambulatory Visit | Attending: Student in an Organized Health Care Education/Training Program | Admitting: Student in an Organized Health Care Education/Training Program

## 2022-09-11 DIAGNOSIS — M79641 Pain in right hand: Secondary | ICD-10-CM

## 2022-09-11 DIAGNOSIS — M549 Dorsalgia, unspecified: Secondary | ICD-10-CM | POA: Diagnosis not present

## 2022-09-11 DIAGNOSIS — M1811 Unilateral primary osteoarthritis of first carpometacarpal joint, right hand: Secondary | ICD-10-CM | POA: Diagnosis not present

## 2022-09-11 DIAGNOSIS — M19041 Primary osteoarthritis, right hand: Secondary | ICD-10-CM | POA: Diagnosis not present

## 2022-09-13 ENCOUNTER — Ambulatory Visit: Payer: Medicare HMO | Admitting: Physical Medicine & Rehabilitation

## 2022-09-13 NOTE — Telephone Encounter (Signed)
Pt has an appt on 09/19/22.

## 2022-09-14 NOTE — Progress Notes (Signed)
This encounter was created in error - please disregard.

## 2022-09-18 ENCOUNTER — Telehealth: Payer: Self-pay

## 2022-09-18 NOTE — Telephone Encounter (Signed)
Call from patient asking about her sugar levels.  Does not want  to have Diabetes. No HbA1C's to report.  Patient has an appointment on tomorrow will ask about getting testing for Diabetes.

## 2022-09-18 NOTE — Telephone Encounter (Signed)
Requesting to speak with a nurse about blood sugar. Please call pt back.

## 2022-09-19 ENCOUNTER — Encounter: Payer: Self-pay | Admitting: Student

## 2022-09-19 ENCOUNTER — Ambulatory Visit (INDEPENDENT_AMBULATORY_CARE_PROVIDER_SITE_OTHER): Payer: Medicare HMO | Admitting: Student

## 2022-09-19 ENCOUNTER — Other Ambulatory Visit: Payer: Self-pay

## 2022-09-19 VITALS — BP 141/76 | HR 82 | Temp 97.6°F | Ht 63.0 in | Wt 229.3 lb

## 2022-09-19 DIAGNOSIS — R739 Hyperglycemia, unspecified: Secondary | ICD-10-CM | POA: Diagnosis not present

## 2022-09-19 DIAGNOSIS — I10 Essential (primary) hypertension: Secondary | ICD-10-CM

## 2022-09-19 DIAGNOSIS — E119 Type 2 diabetes mellitus without complications: Secondary | ICD-10-CM

## 2022-09-19 DIAGNOSIS — R682 Dry mouth, unspecified: Secondary | ICD-10-CM | POA: Insufficient documentation

## 2022-09-19 DIAGNOSIS — H04123 Dry eye syndrome of bilateral lacrimal glands: Secondary | ICD-10-CM | POA: Diagnosis not present

## 2022-09-19 DIAGNOSIS — Z87891 Personal history of nicotine dependence: Secondary | ICD-10-CM | POA: Diagnosis not present

## 2022-09-19 HISTORY — DX: Dry mouth, unspecified: R68.2

## 2022-09-19 MED ORDER — BIOTENE DRY MOUTH GENTLE MT LIQD: 5.0000 mL | Freq: Every day | OROMUCOSAL | 0 refills | Status: DC

## 2022-09-19 NOTE — Assessment & Plan Note (Addendum)
Patient presents to the clinic today with concerns of 1 month history of having a dry mouth.  She reports that at times, she feels like her mouth is very dry.  She states her lips have become more dry as well.  She states that sometimes she has trouble swallowing because her mouth is dry.  She denies any burning sensation in her eyes, or dry eyes.  She denies any history of autoimmune disease. She denies any new toothpaste or mouthwashes. She does wear dentures but denies changes to the cleaning solution to her dentures.  She denies any family history of autoimmune disease.  She has never had this problem before.  On exam, patient does have dry tongue, with noticeable cracks.  No ulcers or bleeding appreciated.  Oropharynx looks moist on my exam.  Differential includes Sjogren's disease, medication induced, or autoimmune related.  Will evaluate with labs.  Will also refer patient to ophthalmology to further evaluate.  Medications that could be contributing include cetirizine, Percocet, hydrochlorothiazide, olmesartan, Flexeril. Encouraged the patient to try to avoid these medications and only use when she absolutely needs these medications. Patient voiced understanding about this.   Plan: -Follow-up ANA, anti-Ro antibodies, anti-La antibodies -Refer to ophthalmology -Start Biotene rinses  Addendum:  -Sjogren's syndrome ruled out. Could likely be related to multiple medications. Will see how the biotene works. Will follow up the ANA panel.

## 2022-09-19 NOTE — Assessment & Plan Note (Signed)
Patient has had multiple BMPs in the past with elevated glucose levels.  Patient has had A1c's in the past, which have not been consistent with diabetes.  Patient's last A1c seems to be in 2019 which was normal.  With recent elevated glucose levels, will plan to get A1c today.  Plan: -Follow-up A1c

## 2022-09-19 NOTE — Assessment & Plan Note (Addendum)
Patient has a past medical history of hypertension.  Patient currently on amlodipine 10 mg daily, hydrochlorothiazide 12.5 mg daily, and olmesartan 40 mg daily.  Patient reports compliance with medications.  Blood pressure today is 135/66.  On repeat, blood pressure is 141/76.  Patient denies any chest pain, shortness of breath, or vision changes.  Patient states she is doing well.  She has no concerns at this time.  I do encourage patient to make lifestyle modifications, or likely will need to increase blood pressure medicine as blood pressure slightly elevated today in office.  Plan: -Continue amlodipine 10 mg daily -Continue hydrochlorothiazide 12.5 mg daily -Continue olmesartan 40 mg daily -Follow-up in 1 month for blood pressure follow-up -Follow up BMP -Patient encouraged to make lifestyle modifications  Addendum:  Kidney function and electrolytes within normal limits.
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
## 2022-09-19 NOTE — Patient Instructions (Addendum)
Carrie Franco,Thank you for allowing me to take part in your care today.  Here are your instructions.  1. I will check labs, I will call you with the results for your kidney function and your sugars. I am doing abs to find out why you are having a dry mouth. I have also referred you to a eye doctor.   2. I have written you for Biotene, this will help with your dry mouth. Please rinse your mouth with this daily.   3. Please come back in about 1 month and we can address blood pressure at that time.   Thank you, Dr. Allena Katz  If you have any other questions please contact the internal medicine clinic at [phone]

## 2022-09-20 DIAGNOSIS — E119 Type 2 diabetes mellitus without complications: Secondary | ICD-10-CM | POA: Insufficient documentation

## 2022-09-20 NOTE — Progress Notes (Signed)
Internal Medicine Clinic Attending  Case discussed with Dr. Allena Katz  At the time of the visit.  We reviewed the resident's history and exam and pertinent patient test results.  I agree with the assessment, diagnosis, and plan of care documented in the resident's note. Screening with antibodies for Sjogren syndrome today, eye referral for dry eye evaluation. Multiple medications that may be contributing to xerostomia including opioids, antihistamine, and muscle relaxant. If symptoms persist and are bothersome, alternative medication options could be discussed.

## 2022-09-20 NOTE — Assessment & Plan Note (Signed)
Addendum: -A1c 6.7. This is a new diagnosis. Could be related to the dry mouth with polyuria. Had extensive conversation with patient about her new diagnosis. She was emotional about this as she was concerned that she did have diabetes. Offered her emotional support. Did offer her options as she is in the well controlled range. Offered her metformin and she did not want this as she heard very bad things about this medication. She did ask about ozempic but I stated that insurance would not approved this until she has failed something like metformin. I also informed patient about diet and exercise which could also prove effective. Patient states that at this time she does not know which option to choose and would like to think about what to do and she will call back to give a decision.

## 2022-09-21 LAB — BMP8+ANION GAP
Anion Gap: 17 mmol/L (ref 10.0–18.0)
BUN/Creatinine Ratio: 18 (ref 9–23)
BUN: 14 mg/dL (ref 6–24)
CO2: 21 mmol/L (ref 20–29)
Calcium: 10.2 mg/dL (ref 8.7–10.2)
Chloride: 101 mmol/L (ref 96–106)
Creatinine, Ser: 0.78 mg/dL (ref 0.57–1.00)
Glucose: 111 mg/dL — ABNORMAL HIGH (ref 70–99)
Potassium: 4.2 mmol/L (ref 3.5–5.2)
Sodium: 139 mmol/L (ref 134–144)
eGFR: 88 mL/min/{1.73_m2} (ref >59)

## 2022-09-21 LAB — HEMOGLOBIN A1C
Est. average glucose Bld gHb Est-mCnc: 146 mg/dL
Hgb A1c MFr Bld: 6.7 % — ABNORMAL HIGH (ref 4.8–5.6)

## 2022-09-21 LAB — SJOGREN'S SYNDROME ANTIBODS(SSA + SSB)
ENA SSA (RO) Ab: <0.2 AI (ref 0.0–0.9)
ENA SSB (LA) Ab: <0.2 AI (ref 0.0–0.9)

## 2022-09-21 LAB — ANTINUCLEAR ANTIBODIES, IFA: ANA Titer 1: NEGATIVE

## 2022-09-24 ENCOUNTER — Ambulatory Visit: Payer: Medicare HMO | Admitting: Orthopedic Surgery

## 2022-09-24 ENCOUNTER — Other Ambulatory Visit: Payer: Self-pay | Admitting: Student

## 2022-09-25 ENCOUNTER — Telehealth: Payer: Self-pay

## 2022-09-25 DIAGNOSIS — E119 Type 2 diabetes mellitus without complications: Secondary | ICD-10-CM

## 2022-09-25 NOTE — Telephone Encounter (Signed)
Incoming fax from pharmacy Message to prescriber: "Dr's DEA not found for medicaid to cover"

## 2022-09-25 NOTE — Telephone Encounter (Signed)
Requesting lab results, please call pt back.  

## 2022-09-25 NOTE — Telephone Encounter (Signed)
Called the patient to talk about her lab results.  I have called her before to go over her labs, please see the previous note about it.  She did want to talk again about her diabetes.  She states that she is still scared that her diabetes is not well-controlled.  I reassured her that her diabetes is well-controlled.  I offered her advice.  Also she requests advice for nutrition.  I will refer her to diabetic educator and nutritionist.  She agrees to this plan.  Also her daughter on the phone who stated that she will help her mother exercise more and help get her A1c down.  I did state to the patient that 6.7 is well-controlled and we would like to keep her under 6.7, but she states that she will try to exercise and diet more.

## 2022-09-27 ENCOUNTER — Ambulatory Visit: Payer: Medicare HMO | Attending: Registered Nurse | Admitting: Physical Therapy

## 2022-10-02 ENCOUNTER — Encounter: Payer: Self-pay | Admitting: Physical Medicine & Rehabilitation

## 2022-10-02 ENCOUNTER — Encounter: Payer: Medicare HMO | Attending: Physical Medicine and Rehabilitation | Admitting: Physical Medicine & Rehabilitation

## 2022-10-02 VITALS — BP 110/72 | HR 86 | Ht 63.0 in | Wt 223.0 lb

## 2022-10-02 DIAGNOSIS — Z5181 Encounter for therapeutic drug level monitoring: Secondary | ICD-10-CM | POA: Diagnosis not present

## 2022-10-02 DIAGNOSIS — Z79891 Long term (current) use of opiate analgesic: Secondary | ICD-10-CM | POA: Insufficient documentation

## 2022-10-02 DIAGNOSIS — G629 Polyneuropathy, unspecified: Secondary | ICD-10-CM | POA: Diagnosis not present

## 2022-10-02 DIAGNOSIS — G5702 Lesion of sciatic nerve, left lower limb: Secondary | ICD-10-CM | POA: Diagnosis not present

## 2022-10-02 DIAGNOSIS — M47816 Spondylosis without myelopathy or radiculopathy, lumbar region: Secondary | ICD-10-CM | POA: Insufficient documentation

## 2022-10-02 DIAGNOSIS — M1711 Unilateral primary osteoarthritis, right knee: Secondary | ICD-10-CM | POA: Insufficient documentation

## 2022-10-02 DIAGNOSIS — M7918 Myalgia, other site: Secondary | ICD-10-CM | POA: Diagnosis not present

## 2022-10-02 DIAGNOSIS — G894 Chronic pain syndrome: Secondary | ICD-10-CM | POA: Insufficient documentation
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
## 2022-10-08 ENCOUNTER — Encounter (HOSPITAL_BASED_OUTPATIENT_CLINIC_OR_DEPARTMENT_OTHER): Payer: Medicare HMO | Admitting: Registered Nurse

## 2022-10-08 ENCOUNTER — Ambulatory Visit: Payer: Medicare HMO | Admitting: Registered Nurse

## 2022-10-08 ENCOUNTER — Encounter: Payer: Self-pay | Admitting: Registered Nurse

## 2022-10-08 ENCOUNTER — Ambulatory Visit: Payer: Medicare HMO | Admitting: Obstetrics and Gynecology

## 2022-10-08 VITALS — BP 125/74 | HR 87 | Ht 63.0 in | Wt 221.6 lb

## 2022-10-08 DIAGNOSIS — G894 Chronic pain syndrome: Secondary | ICD-10-CM | POA: Diagnosis not present

## 2022-10-08 DIAGNOSIS — Z79891 Long term (current) use of opiate analgesic: Secondary | ICD-10-CM | POA: Diagnosis not present

## 2022-10-08 DIAGNOSIS — M7918 Myalgia, other site: Secondary | ICD-10-CM

## 2022-10-08 DIAGNOSIS — M1711 Unilateral primary osteoarthritis, right knee: Secondary | ICD-10-CM

## 2022-10-08 DIAGNOSIS — Z5181 Encounter for therapeutic drug level monitoring: Secondary | ICD-10-CM

## 2022-10-08 DIAGNOSIS — G629 Polyneuropathy, unspecified: Secondary | ICD-10-CM | POA: Diagnosis not present

## 2022-10-08 DIAGNOSIS — M47816 Spondylosis without myelopathy or radiculopathy, lumbar region: Secondary | ICD-10-CM | POA: Diagnosis not present

## 2022-10-08 DIAGNOSIS — G5702 Lesion of sciatic nerve, left lower limb: Secondary | ICD-10-CM | POA: Diagnosis not present

## 2022-10-08 MED ORDER — OXYCODONE-ACETAMINOPHEN 5-325 MG PO TABS: 1.0000 | ORAL_TABLET | Freq: Four times a day (QID) | ORAL | 0 refills | Status: DC | PRN
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
## 2022-10-09 ENCOUNTER — Other Ambulatory Visit: Payer: Medicare HMO | Admitting: Obstetrics and Gynecology

## 2022-10-09 NOTE — Patient Outreach (Signed)
Medicaid Managed Care   Nurse Care Manager Note  10/09/2022 Name:  Carrie Franco MRN:  [id number] DOB:  [birth year]  Carrie Franco is an 57 y.o. year old female who is a primary patient of Carrie Crocker, MD.  The [facility] Managed Care Coordination team was consulted for assistance with:    Chronic healthcare management needs, HLD, HTN, chronic pain, polyneuropathy, asthma, emphysema, GERD, osteoarthritis, depression, radiculitis,  spondylosis  Carrie Franco was given information about Medicaid Managed Care Coordination team services today. Carrie Franco Patient agreed to services and verbal consent obtained.  Engaged with patient by telephone for follow up visit in response to provider referral for case management and/or care coordination services.   Assessments/Interventions:  Review of past medical history, allergies, medications, health status, including review of consultants reports, laboratory and other test data, was performed as part of comprehensive evaluation and provision of chronic care management services.  SDOH (Social Determinants of Health) assessments and interventions performed: SDOH Interventions    Flowsheet Row Patient Outreach Telephone from 10/09/2022 in [place] POPULATION HEALTH DEPARTMENT Patient Outreach Telephone from 08/07/2022 in [place] POPULATION HEALTH DEPARTMENT Patient Outreach Telephone from 07/02/2022 in [place] POPULATION HEALTH DEPARTMENT Patient Outreach Telephone from 05/29/2022 in [place] POPULATION HEALTH DEPARTMENT Patient Outreach Telephone from 04/13/2022 in [place] POPULATION HEALTH DEPARTMENT Patient Outreach Telephone from 03/16/2022 in [place] POPULATION HEALTH DEPARTMENT  SDOH Interventions        Food Insecurity Interventions Intervention Not Indicated -- -- -- -- --  Housing Interventions -- -- Intervention Not Indicated -- -- --  Transportation Interventions -- -- Intervention Not Indicated -- -- --  Utilities Interventions  -- -- -- -- -- Intervention Not Indicated  Alcohol Usage Interventions -- -- -- -- Intervention Not Indicated (Score <7) --  Financial Strain Interventions -- -- -- Intervention Not Indicated -- --  Physical Activity Interventions -- -- -- -- Intervention Not Indicated  [patient recovering from knee replacemant surgery-PT 2 times a week] --  Stress Interventions -- Intervention Not Indicated -- -- -- --  Health Literacy Interventions -- Intervention Not Indicated -- -- -- --     Care Plan Allergies  Allergen Reactions   Hydrocodone-Acetaminophen Other (See Comments)   Orange Oil Hives    Pt describes reaction as big bumps   Other     tomatoes   Citrus Hives and Rash    Pt describes reaction as big bumps   Clindamycin/Lincomycin Hives and Rash   Meloxicam Itching and Rash   Penicillins Hives, Rash and Other (See Comments)   Medications Reviewed Today     Reviewed by [name], RN (Registered Nurse) on 10/09/22 at [phone]  Med List Status: <None>   Medication Order Taking? Sig Documenting Provider Last Dose Status Informant  amLODipine (NORVASC) 10 MG tablet [id number] No Take 1 tablet (10 mg total) by mouth daily. Carrie Crocker, MD Taking Active Self  cetirizine (ZYRTEC) 10 MG tablet [id number] No Take 1 tablet by mouth once daily [name], MD Taking Active Self  cyclobenzaprine (FLEXERIL) 10 MG tablet [id number] No Take 1 tablet (10 mg total) by mouth 3 (three) times daily as needed for muscle spasms. Nooruddin, Jason Fila, MD Taking Active   diclofenac Sodium (VOLTAREN) 1 % GEL [id number] No Apply 4 g topically 4 (four) times daily. Carrie Crocker, MD Taking Active   FLUoxetine (PROZAC) 40 MG capsule [id number] No Take 1 capsule by mouth once daily [name], MD Taking Active  fluticasone (FLONASE) 50 MCG/ACT nasal spray [id number] No Place 2 sprays into both nostrils daily as needed for allergies. [provider] Taking Active Self  fluticasone-salmeterol  (ADVAIR HFA) 784-69 MCG/ACT inhaler [id number] No Inhale 2 puffs into the lungs 2 (two) times daily. Masters, [name], DO Taking Active   hydrochlorothiazide (MICROZIDE) 12.5 MG capsule [id number] No Take 1 capsule (12.5 mg total) by mouth daily. Carrie Crocker, MD Taking Active Self  ibuprofen (ADVIL) 800 MG tablet [id number] No Take 1 tablet (800 mg total) by mouth every 6 (six) hours as needed for fever or moderate pain. [name], MD Taking Active   ipratropium-albuterol (DUONEB) 0.5-2.5 (3) MG/3ML SOLN [id number] No Take by nebulization. [provider] Taking Active   Lido-Capsaicin-Men-Methyl Sal (1ST MEDX-PATCH/ LIDOCAINE) 4-0.025-5-20 % PTCH [id number] No Apply 1 patch topically daily. [name], MD Taking Active Self  Mouthwashes [facility] DRY MOUTH GENTLE) LIQD [id number] No Use as directed 5 mLs in the mouth or throat daily. Please rinse your mouth with 5 mL Modena Slater, DO Taking Active   olmesartan (BENICAR) 40 MG tablet [id number] No Take 1 tablet (40 mg total) by mouth daily. Carrie Crocker, MD Taking Active Self  oxyCODONE-acetaminophen (PERCOCET) 5-325 MG tablet [id number]  Take 1 tablet by mouth every 6 (six) hours as needed for severe pain. [name], NP  Active   pantoprazole (PROTONIX) 40 MG tablet [id number] No Take 1 tablet (40 mg total) by mouth daily. Carrie Crocker, MD Taking Active Self  pregabalin (LYRICA) 100 MG capsule [id number] No Take 1 capsule by mouth twice daily Carrie Crocker, MD Taking Active   triamcinolone ointment (KENALOG) 0.1 % [id number] No APPLY  THIN LAYER TOPICALLY TWICE DAILY AS NEEDED Carrie Crocker, MD Taking Active   valACYclovir (VALTREX) 500 MG tablet [id number] No Take 1 tablet (500 mg total) by mouth daily.  Patient taking differently: Take 500 mg by mouth daily as needed (outbreaks).   [name], MD Taking Active Self           Patient Active Problem List   Diagnosis Date Noted   Type 2  diabetes mellitus (HCC) 09/20/2022   Dry mouth 09/19/2022   Hyperglycemia 09/19/2022   Gluteal pain 07/31/2022   Hand pain, right 07/31/2022   Dyspnea 06/29/2022   Post-viral cough syndrome 03/09/2022   S/P total knee replacement, 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   Rhomboid muscle strain 02/14/2022   Preoperative clearance 02/01/2022   Thrombocytopenia (HCC) 02/01/2022   Epistaxis 11/15/2021   Thyroid nodule 12/01/2020   Headache 10/02/2020   Cervical radiculopathy 08/12/2020   Aortic atherosclerosis (HCC) 02/17/2020   Emphysema of lung (HCC) 02/17/2020   Neuropathic pain 11/26/2019   Strain of thoracic paraspinal muscles excluding T1 and T2 levels 04/18/2018   Onychomycosis 12/04/2017   Radiculopathy of lumbosacral region 06/24/2017   Depression 01/02/2017   Osteoarthritis 12/05/2015   Arthritis of right knee 12/05/2015   Trigeminal neuralgia of right side of face 11/09/2015   Eczema 04/01/2015   Chronic neck pain 03/08/2015   History of colonic polyps 09/07/2014   Normocytic anemia 09/07/2014   Hyperlipidemia 07/31/2013   GERD (gastroesophageal reflux disease) 03/19/2013   Cerumen impaction 08/07/2012   Obesity (BMI 30.0-34.9) 08/05/2012   Right thigh pain 07/17/2012   Vaginal discharge 06/22/2011   Persistent asthma 02/23/2010   Healthcare maintenance 02/23/2010   Essential hypertension 01/13/2007   History of herpes genitalis 01/02/2006   Allergic sinusitis 01/02/2006   Conditions to be addressed/monitored per PCP order:  Chronic healthcare management needs,  HLD, HTN, chronic pain, polyneuropathy, asthma, emphysema, GERD, osteoarthritis, depression, radiculitis,  spondylosis  Care Plan : RN Care Manager Plan of Care  Updates made by [name], RN since 10/09/2022 12:00 AM     Problem: Health Promotion or Disease Self-Management (General Plan of Care)      Long-Range Goal: Chronic Disease Management   Start Date: 03/16/2022  Expected End Date: 01/08/2023  Priority: High   Note:   Current Barriers:  Knowledge Deficits related to plan of care for management of HTN, asthma, emphysema, GERD, osteoarthritis, HLD, depression, chronic pain  Chronic Disease Management support and education needs related to HTN, asthma, emphysema, GERD, osteoarthritis, HLD, depression, chronic pain  10/09/22:  Patient to f/u on eye referral and nutrition referral.  HGBA1C=6.7 BP stable.  Breathing WNL.    RNCM Clinical Goal(s):  Patient will verbalize understanding of plan for management of  HTN, asthma, emphysema, GERD, osteoarthritis, HLD, depression, chronic pain  as evidenced by patient report verbalize basic understanding of  HTN, asthma, emphysema, GERD, osteoarthritis, HLD, depression, chronic pain  disease process and self health management plan as evidenced by patient report take all medications exactly as prescribed and will call provider for medication related questions as evidenced by patient report demonstrate understanding of rationale for each prescribed medication as evidenced by patient report attend all scheduled medical appointments  as evidenced by patient report demonstrate ongoing adherence to prescribed treatment plan for  as evidenced by patient report and EMR review continue to work with RN Care Manager to address care management and care coordination needs related to HTN, asthma, emphysema, GERD, osteoarthritis, HLD, depression, chronic pain  as evidenced by adherence to CM Team Scheduled appointments through collaboration with RN Care manager, provider, and care team.   Interventions: Inter-disciplinary care team collaboration (see longitudinal plan of care) Evaluation of current treatment plan related to  self management and patient's adherence to plan as established by provider  Asthma: (Status:New goal.) Long Term Goal Advised patient to track and manage Asthma triggers Advised patient to self assesses Asthma action plan zone and make appointment with  provider if in the yellow zone for 48 hours without improvement Provided education about and advised patient to utilize infection prevention strategies to reduce risk of respiratory infection Discussed the importance of adequate rest and management of fatigue with Asthma Assessed social determinant of health barriers   Hyperlipidemia Interventions:  (Status:  New goal.) Long Term Goal Medication review performed; medication list updated in electronic medical record.  Provider established cholesterol goals reviewed Counseled on importance of regular laboratory monitoring as prescribed Reviewed importance of limiting foods high in cholesterol Assessed social determinant of health barriers   Hypertension Interventions:  (Status:  New goal.) Long Term Goal Last practice recorded BP readings:  BP Readings from Last 3 Encounters:           10/08/22         125/74 07/02/22         132/70 08/07/22         128/68  Most recent eGFR/CrCl:  Lab Results  Component Value Date   EGFR 93 11/15/2021    No components found for: "CRCL"  Evaluation of current treatment plan related to hypertension self management and patient's adherence to plan as established by provider Reviewed medications with patient and discussed importance of compliance Discussed plans with patient for ongoing care management follow up and provided patient with direct contact information for care management team Advised patient, providing  education and rationale, to monitor blood pressure daily and record, calling PCP for findings outside established parameters Reviewed scheduled/upcoming provider appointments  Assessed social determinant of health barriers  Pain Interventions:  (Status:  New goal.) Long Term Goal Pain assessment performed Medications reviewed Reviewed provider established plan for pain management Discussed importance of adherence to all scheduled medical appointments Counseled on the importance of  reporting any/all new or changed pain symptoms or management strategies to pain management provider Advised patient to report to care team affect of pain on daily activities Discussed use of relaxation techniques and/or diversional activities to assist with pain reduction (distraction, imagery, relaxation, massage, acupressure, TENS, heat, and cold application Reviewed with patient prescribed pharmacological and nonpharmacological pain relief strategies Assessed social determinant of health barriers  Surgery  (Status: New goal.) Long Term Goal Evaluation of current treatment plan related to knee replacement surgery assessed patient/caregiver understanding of surgical procedure   reviewed post-operative instructions with patient/caregiver reviewed medications with patient and addressed questions reviewed scheduled provider appointments with patient confirmed availability of transportation to all appointment  Patient Goals/Self-Care Activities: Take all medications as prescribed Attend all scheduled provider appointments Call pharmacy for medication refills 3-7 days in advance of running out of medications Perform all self care activities independently  Perform IADL's (shopping, preparing meals, housekeeping, managing finances) independently Call provider office for new concerns or questions  To f/u with provider regarding right hand xray-completed 10/09/22: Patient to f/u on eye and nutrition referrals  Follow Up Plan:  The patient has been provided with contact information for the care management team and has been advised to call with any health related questions or concerns.  The care management team will reach out to the patient again over the next 60 business  days.    Long-Range Goal: Establish Plan of Care for Chronic Disease Management Needs   Priority: High  Note:   Timeframe:  Long-Range Goal Priority:  High Start Date:   03/16/22                          Expected End Date:   ongoing                     Follow Up Date 11/08/22   - practice safe sex - schedule appointment for vaccines needed due to my age or health - schedule recommended health tests (blood work, mammogram, colonoscopy, pap test) - schedule and keep appointment for annual check-up    Why is this important?   Screening tests can find diseases early when they are easier to treat.  Your doctor or nurse will talk with you about which tests are important for you.  Getting shots for common diseases like the flu and shingles will help prevent them.  10/09/22: recent PCP and pain management appt-to f/u on eye referral and nutrition referral.   Follow Up:  Patient agrees to Care Plan and Follow-up.  Plan: The Managed Medicaid care management team will reach out to the patient again over the next 30 business  days. and The  Patient has been provided with contact information for the Managed Medicaid care management team and has been advised to call with any health related questions or concerns.  Date/time of next scheduled RN care management/care coordination outreach: 11/08/22 at 1030

## 2022-10-09 NOTE — Patient Instructions (Signed)
Hi Carrie Franco, thank you for speaking with me this morning-don't forget to follow up on the eye and nutrition referrals.  Carrie Franco was given information about Medicaid Managed Care team care coordination services and verbally consented to engagement with the [facility] Managed Care team.   Carrie Franco - following are the goals we discussed in your visit today:   Goals Addressed    Timeframe:  Long-Range Goal Priority:  High Start Date:   03/16/22                          Expected End Date:  ongoing                     Follow Up Date 11/08/22   - practice safe sex - schedule appointment for vaccines needed due to my age or health - schedule recommended health tests (blood work, mammogram, colonoscopy, pap test) - schedule and keep appointment for annual check-up    Why is this important?   Screening tests can find diseases early when they are easier to treat.  Your doctor or nurse will talk with you about which tests are important for you.  Getting shots for common diseases like the flu and shingles will help prevent them.  10/09/22: recent PCP and pain management appt-to f/u on eye referral and nutrition referral.  Patient verbalizes understanding of instructions and care plan provided today and agrees to view in MyChart. Active MyChart status and patient understanding of how to access instructions and care plan via MyChart confirmed with patient.     The Managed Medicaid care management team will reach out to the patient again over the next 30 business  days.  The  Patient  has been provided with contact information for the Managed Medicaid care management team and has been advised to call with any health related questions or concerns.   Following is a copy of your plan of care:  Care Plan : RN Care Manager Plan of Care  Updates made by [name], RN since 10/09/2022 12:00 AM     Problem: Health Promotion or Disease Self-Management (General Plan of Care)      Long-Range Goal: Chronic  Disease Management   Start Date: 03/16/2022  Expected End Date: 01/08/2023  Priority: High  Note:   Current Barriers:  Knowledge Deficits related to plan of care for management of HTN, asthma, emphysema, GERD, osteoarthritis, HLD, depression, chronic pain  Chronic Disease Management support and education needs related to HTN, asthma, emphysema, GERD, osteoarthritis, HLD, depression, chronic pain  10/09/22:  Patient to f/u on eye referral and nutrition referral.  HGBA1C=6.7 BP stable.  Breathing WNL.    RNCM Clinical Goal(s):  Patient will verbalize understanding of plan for management of  HTN, asthma, emphysema, GERD, osteoarthritis, HLD, depression, chronic pain  as evidenced by patient report verbalize basic understanding of  HTN, asthma, emphysema, GERD, osteoarthritis, HLD, depression, chronic pain  disease process and self health management plan as evidenced by patient report take all medications exactly as prescribed and will call provider for medication related questions as evidenced by patient report demonstrate understanding of rationale for each prescribed medication as evidenced by patient report attend all scheduled medical appointments  as evidenced by patient report demonstrate ongoing adherence to prescribed treatment plan for  as evidenced by patient report and EMR review continue to work with RN Care Manager to address care management and care coordination needs related to  HTN, asthma, emphysema, GERD, osteoarthritis, HLD, depression, chronic pain  as evidenced by adherence to CM Team Scheduled appointments through collaboration with RN Care manager, provider, and care team.   Interventions: Inter-disciplinary care team collaboration (see longitudinal plan of care) Evaluation of current treatment plan related to  self management and patient's adherence to plan as established by provider  Asthma: (Status:New goal.) Long Term Goal Advised patient to track and manage Asthma  triggers Advised patient to self assesses Asthma action plan zone and make appointment with provider if in the yellow zone for 48 hours without improvement Provided education about and advised patient to utilize infection prevention strategies to reduce risk of respiratory infection Discussed the importance of adequate rest and management of fatigue with Asthma Assessed social determinant of health barriers   Hyperlipidemia Interventions:  (Status:  New goal.) Long Term Goal Medication review performed; medication list updated in electronic medical record.  Provider established cholesterol goals reviewed Counseled on importance of regular laboratory monitoring as prescribed Reviewed importance of limiting foods high in cholesterol Assessed social determinant of health barriers   Hypertension Interventions:  (Status:  New goal.) Long Term Goal Last practice recorded BP readings:  BP Readings from Last 3 Encounters:           10/08/22         125/74 07/02/22         132/70 08/07/22         128/68  Most recent eGFR/CrCl:  Lab Results  Component Value Date   EGFR 93 11/15/2021    No components found for: "CRCL"  Evaluation of current treatment plan related to hypertension self management and patient's adherence to plan as established by provider Reviewed medications with patient and discussed importance of compliance Discussed plans with patient for ongoing care management follow up and provided patient with direct contact information for care management team Advised patient, providing education and rationale, to monitor blood pressure daily and record, calling PCP for findings outside established parameters Reviewed scheduled/upcoming provider appointments  Assessed social determinant of health barriers  Pain Interventions:  (Status:  New goal.) Long Term Goal Pain assessment performed Medications reviewed Reviewed provider established plan for pain management Discussed  importance of adherence to all scheduled medical appointments Counseled on the importance of reporting any/all new or changed pain symptoms or management strategies to pain management provider Advised patient to report to care team affect of pain on daily activities Discussed use of relaxation techniques and/or diversional activities to assist with pain reduction (distraction, imagery, relaxation, massage, acupressure, TENS, heat, and cold application Reviewed with patient prescribed pharmacological and nonpharmacological pain relief strategies Assessed social determinant of health barriers  Surgery  (Status: New goal.) Long Term Goal Evaluation of current treatment plan related to knee replacement surgery assessed patient/caregiver understanding of surgical procedure   reviewed post-operative instructions with patient/caregiver reviewed medications with patient and addressed questions reviewed scheduled provider appointments with patient confirmed availability of transportation to all appointment  Patient Goals/Self-Care Activities: Take all medications as prescribed Attend all scheduled provider appointments Call pharmacy for medication refills 3-7 days in advance of running out of medications Perform all self care activities independently  Perform IADL's (shopping, preparing meals, housekeeping, managing finances) independently Call provider office for new concerns or questions  To f/u with provider regarding right hand xray-completed 10/09/22: Patient to f/u on eye and nutrition referrals  Follow Up Plan:  The patient has been provided with contact information for the care management team and  has been advised to call with any health related questions or concerns.  The care management team will reach out to the patient again over the next 60 business  days.    Kathi Der RN, BSN [place]  [employer] - Managed Medicaid High  Risk [phone]

## 2022-10-13 LAB — DRUG TOX MONITOR 1 W/CONF, ORAL FLD
Amphetamines: NEGATIVE ng/mL (ref <10)
Barbiturates: NEGATIVE ng/mL (ref <10)
Benzodiazepines: NEGATIVE ng/mL (ref <0.50)
Buprenorphine: NEGATIVE ng/mL (ref <0.10)
Cocaine: NEGATIVE ng/mL (ref <5.0)
Codeine: NEGATIVE ng/mL (ref <2.5)
Dihydrocodeine: NEGATIVE ng/mL (ref <2.5)
Fentanyl: NEGATIVE ng/mL (ref <0.10)
Heroin Metabolite: NEGATIVE ng/mL (ref <1.0)
Hydrocodone: NEGATIVE ng/mL (ref <2.5)
Hydromorphone: NEGATIVE ng/mL (ref <2.5)
MARIJUANA: NEGATIVE ng/mL (ref <2.5)
MDMA: NEGATIVE ng/mL (ref <10)
Meprobamate: NEGATIVE ng/mL (ref <2.5)
Methadone: NEGATIVE ng/mL (ref <5.0)
Morphine: NEGATIVE ng/mL (ref <2.5)
Nicotine Metabolite: NEGATIVE ng/mL (ref <5.0)
Norhydrocodone: NEGATIVE ng/mL (ref <2.5)
Noroxycodone: NEGATIVE ng/mL (ref <2.5)
Opiates: POSITIVE ng/mL — AB (ref <2.5)
Oxycodone: 10.5 ng/mL — ABNORMAL HIGH (ref <2.5)
Oxymorphone: NEGATIVE ng/mL (ref <2.5)
Phencyclidine: NEGATIVE ng/mL (ref <10)
Tapentadol: NEGATIVE ng/mL (ref <5.0)
Tramadol: NEGATIVE ng/mL (ref <5.0)
Zolpidem: NEGATIVE ng/mL (ref <5.0)

## 2022-10-13 LAB — DRUG TOX ALC METAB W/CON, ORAL FLD: Alcohol Metabolite: NEGATIVE ng/mL (ref <25)

## 2022-10-22 ENCOUNTER — Encounter: Payer: Medicare HMO | Attending: Obstetrics and Gynecology | Admitting: Dietician

## 2022-10-22 ENCOUNTER — Encounter: Payer: Self-pay | Admitting: Dietician

## 2022-10-22 ENCOUNTER — Ambulatory Visit: Payer: Medicare HMO | Attending: Physical Medicine & Rehabilitation | Admitting: Physical Therapy

## 2022-10-22 ENCOUNTER — Other Ambulatory Visit: Payer: Self-pay

## 2022-10-22 ENCOUNTER — Encounter: Payer: Self-pay | Admitting: Physical Therapy

## 2022-10-22 VITALS — Wt 222.4 lb

## 2022-10-22 DIAGNOSIS — G5702 Lesion of sciatic nerve, left lower limb: Secondary | ICD-10-CM | POA: Insufficient documentation

## 2022-10-22 DIAGNOSIS — M5459 Other low back pain: Secondary | ICD-10-CM | POA: Insufficient documentation

## 2022-10-22 DIAGNOSIS — M6281 Muscle weakness (generalized): Secondary | ICD-10-CM | POA: Insufficient documentation

## 2022-10-22 DIAGNOSIS — E119 Type 2 diabetes mellitus without complications: Secondary | ICD-10-CM | POA: Diagnosis not present

## 2022-10-22 DIAGNOSIS — M47816 Spondylosis without myelopathy or radiculopathy, lumbar region: Secondary | ICD-10-CM | POA: Insufficient documentation

## 2022-10-22 NOTE — Patient Instructions (Signed)
1 monitor blood sugars - before a meal ( 80-130 target)  or 2 hours after a meal (less than 180 target)   2- Try the plate planner for meal planning:  1/2 non starchy vegetables, 1/4 protein, 1/4 carbohydrate  3- Great job avoiding sugary sweetened sodas-continue

## 2022-10-22 NOTE — Patient Instructions (Signed)
Access Code: [email or web address] URL: [email or web address] Date: 10/22/2022 Prepared by: Rosana Hoes  Exercises - Supine Posterior Pelvic Tilt  - 1 x daily - 2 sets - 10 reps - 5 seconds hold - Clam with Resistance  - 1 x daily - 2 sets - 15 reps - Supine Piriformis Stretch with Foot on Ground  - 1 x daily - 3 reps - 30 seconds hold - Supine Lower Trunk Rotation  - 1 x daily - 10 reps - 5 seconds hold - Modified Thomas Stretch  - 1 x daily - 3 reps - 60 seconds hold
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
## 2022-10-23 ENCOUNTER — Other Ambulatory Visit: Payer: Self-pay | Admitting: Student

## 2022-10-23 MED ORDER — PREGABALIN 100 MG PO CAPS: 100.0000 mg | ORAL_CAPSULE | Freq: Two times a day (BID) | ORAL | 0 refills | Status: DC
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
## 2022-10-30 ENCOUNTER — Encounter: Payer: Medicare HMO | Attending: Physical Medicine and Rehabilitation | Admitting: Physical Medicine & Rehabilitation

## 2022-10-30 ENCOUNTER — Telehealth: Payer: Self-pay | Admitting: *Deleted

## 2022-10-30 DIAGNOSIS — G629 Polyneuropathy, unspecified: Secondary | ICD-10-CM | POA: Insufficient documentation

## 2022-10-30 DIAGNOSIS — Z79891 Long term (current) use of opiate analgesic: Secondary | ICD-10-CM | POA: Insufficient documentation

## 2022-10-30 DIAGNOSIS — Z5181 Encounter for therapeutic drug level monitoring: Secondary | ICD-10-CM | POA: Insufficient documentation

## 2022-10-30 DIAGNOSIS — M47816 Spondylosis without myelopathy or radiculopathy, lumbar region: Secondary | ICD-10-CM | POA: Insufficient documentation

## 2022-10-30 DIAGNOSIS — G894 Chronic pain syndrome: Secondary | ICD-10-CM | POA: Insufficient documentation

## 2022-10-30 DIAGNOSIS — M1711 Unilateral primary osteoarthritis, right knee: Secondary | ICD-10-CM | POA: Insufficient documentation

## 2022-10-30 NOTE — Telephone Encounter (Signed)
Received a call from pt c/o numbness of left leg from the knee to her foot. Stated it started this am around 11Am; stopped then it happened again. Denies pain,shob,weakness. Informed pt I will send this message to the doctor but if symptoms worsen to go to the ER.

## 2022-10-31 NOTE — Telephone Encounter (Signed)
Called and spoke to the patient who confirmed the details.  Her foot became numb after waking up in the morning and this extended up to her knee but after a few minutes she started having paresthesias and then this resolved.  Signs consistent with transient blood flow decreased to the leg that has not occurred again.  She was recommended to call and make a follow-up appointment as she was supposed to follow-up 1 month after her 09/18/2022 appointment.  She will call the front desk and we discussed return precautions.

## 2022-11-06 ENCOUNTER — Telehealth: Payer: Self-pay | Admitting: Dietician

## 2022-11-06 DIAGNOSIS — E119 Type 2 diabetes mellitus without complications: Secondary | ICD-10-CM | POA: Diagnosis not present

## 2022-11-06 DIAGNOSIS — Z0100 Encounter for examination of eyes and vision without abnormal findings: Secondary | ICD-10-CM | POA: Diagnosis not present

## 2022-11-06 NOTE — Telephone Encounter (Signed)
Patient is not on any medications that place her at risk for hypoglycemia. I do not think that she needs to be checking her blood sugar regularly.

## 2022-11-06 NOTE — Telephone Encounter (Signed)
RTC to patient informed her hat Dr. Sloan Leiter said that she is not on any medications that would cause her blood sugars to drop and that she will not need too check her sugars.  Patient voiced understanding of.  States was told to ask for the lancets and strips by the Nutritionist that she had seen.

## 2022-11-06 NOTE — Telephone Encounter (Signed)
Needs strips and a meter per nutritionist we sent her to, please send to [facility] on [address]. Educated about qualification for CGM and times to check blood sugar. Encouraged her to follow up with [facility] or me.

## 2022-11-08 ENCOUNTER — Other Ambulatory Visit: Payer: Self-pay | Admitting: Obstetrics and Gynecology

## 2022-11-08 NOTE — Patient Instructions (Signed)
Visit Information  Carrie Franco was given information about Medicaid Managed Care team care coordination services and verbally consented to engagement with the [facility] Managed Care team.   Carrie Franco - following are the goals we discussed in your visit today:   Goals Addressed    Timeframe:  Long-Range Goal Priority:  High Start Date:   03/16/22                          Expected End Date:  ongoing                     Follow Up Date 01/08/23   - practice safe sex - schedule appointment for vaccines needed due to my age or health - schedule recommended health tests (blood work, mammogram, colonoscopy, pap test) - schedule and keep appointment for annual check-up    Why is this important?   Screening tests can find diseases early when they are easier to treat.  Your doctor or nurse will talk with you about which tests are important for you.  Getting shots for common diseases like the flu and shingles will help prevent them.  11/08/22:  To schedule PT, seen by eye provider yesterday. Seen by Nutrition 10/7  Patient verbalizes understanding of instructions and care plan provided today and agrees to view in MyChart. Active MyChart status and patient understanding of how to access instructions and care plan via MyChart confirmed with patient.     The Managed Medicaid care management team will reach out to the patient again over the next 60 business  days.  The  Patient has been provided with contact information for the Managed Medicaid care management team and has been advised to call with any health related questions or concerns.   Following is a copy of your plan of care:  Care Plan : RN Care Manager Plan of Care  Updates made by [name], RN since 11/08/2022 12:00 AM     Problem: Health Promotion or Disease Self-Management (General Plan of Care)      Long-Range Goal: Chronic Disease Management   Start Date: 03/16/2022  Expected End Date: 01/08/2023  Priority: High  Note:   Current  Barriers:  Knowledge Deficits related to plan of care for management of HTN, asthma, emphysema, GERD, osteoarthritis, HLD, depression, chronic pain  Chronic Disease Management support and education needs related to HTN, asthma, emphysema, GERD, osteoarthritis, HLD, depression, chronic pain  11/08/22:  BP stable-checks occasionally.  Seen by Nutrition and eye provider.  To schedule PT appts for back.  RNCM Clinical Goal(s):  Patient will verbalize understanding of plan for management of  HTN, asthma, emphysema, GERD, osteoarthritis, HLD, depression, chronic pain  as evidenced by patient report verbalize basic understanding of  HTN, asthma, emphysema, GERD, osteoarthritis, HLD, depression, chronic pain  disease process and self health management plan as evidenced by patient report take all medications exactly as prescribed and will call provider for medication related questions as evidenced by patient report demonstrate understanding of rationale for each prescribed medication as evidenced by patient report attend all scheduled medical appointments  as evidenced by patient report demonstrate ongoing adherence to prescribed treatment plan for  as evidenced by patient report and EMR review continue to work with RN Care Manager to address care management and care coordination needs related to HTN, asthma, emphysema, GERD, osteoarthritis, HLD, depression, chronic pain  as evidenced by adherence to CM Team Scheduled appointments through collaboration  with RN Care manager, provider, and care team.   Interventions: Inter-disciplinary care team collaboration (see longitudinal plan of care) Evaluation of current treatment plan related to  self management and patient's adherence to plan as established by provider  Asthma: (Status:New goal.) Long Term Goal Advised patient to track and manage Asthma triggers Advised patient to self assesses Asthma action plan zone and make appointment with provider if in the  yellow zone for 48 hours without improvement Provided education about and advised patient to utilize infection prevention strategies to reduce risk of respiratory infection Discussed the importance of adequate rest and management of fatigue with Asthma Assessed social determinant of health barriers   Hyperlipidemia Interventions:  (Status:  New goal.) Long Term Goal Medication review performed; medication list updated in electronic medical record.  Provider established cholesterol goals reviewed Counseled on importance of regular laboratory monitoring as prescribed Reviewed importance of limiting foods high in cholesterol Assessed social determinant of health barriers   Hypertension Interventions:  (Status:  New goal.) Long Term Goal Last practice recorded BP readings:  BP Readings from Last 3 Encounters:           10/08/22         125/74 11/08/22         139/80 08/07/22         128/68  Most recent eGFR/CrCl:  Lab Results  Component Value Date   EGFR 93 11/15/2021    No components found for: "CRCL"  Evaluation of current treatment plan related to hypertension self management and patient's adherence to plan as established by provider Reviewed medications with patient and discussed importance of compliance Discussed plans with patient for ongoing care management follow up and provided patient with direct contact information for care management team Advised patient, providing education and rationale, to monitor blood pressure daily and record, calling PCP for findings outside established parameters Reviewed scheduled/upcoming provider appointments  Assessed social determinant of health barriers  Pain Interventions:  (Status:  New goal.) Long Term Goal Pain assessment performed Medications reviewed Reviewed provider established plan for pain management Discussed importance of adherence to all scheduled medical appointments Counseled on the importance of reporting any/all new or  changed pain symptoms or management strategies to pain management provider Advised patient to report to care team affect of pain on daily activities Discussed use of relaxation techniques and/or diversional activities to assist with pain reduction (distraction, imagery, relaxation, massage, acupressure, TENS, heat, and cold application Reviewed with patient prescribed pharmacological and nonpharmacological pain relief strategies Assessed social determinant of health barriers  Surgery  (Status: New goal.) Long Term Goal Evaluation of current treatment plan related to knee replacement surgery assessed patient/caregiver understanding of surgical procedure   reviewed post-operative instructions with patient/caregiver reviewed medications with patient and addressed questions reviewed scheduled provider appointments with patient confirmed availability of transportation to all appointment  Patient Goals/Self-Care Activities: Take all medications as prescribed Attend all scheduled provider appointments Call pharmacy for medication refills 3-7 days in advance of running out of medications Perform all self care activities independently  Perform IADL's (shopping, preparing meals, housekeeping, managing finances) independently Call provider office for new concerns or questions  To f/u with provider regarding right hand xray-completed 11/08/22:  patient to schedule PT appts.  Follow Up Plan:  The patient has been provided with contact information for the care management team and has been advised to call with any health related questions or concerns.  The care management team will reach out to the patient  again over the next 60 business  days.   Kathi Der RN, BSN [place]  [employer] - Managed Medicaid High Risk [phone]

## 2022-11-08 NOTE — Patient Outreach (Signed)
Medicaid Managed Care   Nurse Care Manager Note  11/08/2022 Name:  Carrie Franco MRN:  [id number] DOB:  09-21-1965  Carrie Franco is an 57 y.o. year old female who is a primary patient of [name], MD.  The [facility] Managed Care Coordination team was consulted for assistance with:    Chronic healthcare management needs, HLD, HTN, chronic pain, polyneuropathy, asthma, emphysema, GERD, osteoarthritis, radiculitis, spondylosis, depression  Carrie Franco was given information about Medicaid Managed Care Coordination team services today. Carrie Franco Patient agreed to services and verbal consent obtained.  Engaged with patient by telephone for follow up visit in response to provider referral for case management and/or care coordination services.   Assessments/Interventions:  Review of past medical history, allergies, medications, health status, including review of consultants reports, laboratory and other test data, was performed as part of comprehensive evaluation and provision of chronic care management services.  SDOH (Social Determinants of Health) assessments and interventions performed: SDOH Interventions    Flowsheet Row Patient Outreach Telephone from 11/08/2022 in [place] POPULATION HEALTH DEPARTMENT Nutrition from 10/22/2022 in [place] [facility] at [facility] Patient Outreach Telephone from 10/09/2022 in [place] POPULATION HEALTH DEPARTMENT Patient Outreach Telephone from 08/07/2022 in [place] POPULATION HEALTH DEPARTMENT Patient Outreach Telephone from 07/02/2022 in [place] POPULATION HEALTH DEPARTMENT Patient Outreach Telephone from 05/29/2022 in [place] POPULATION HEALTH DEPARTMENT  SDOH Interventions        Food Insecurity Interventions -- Intervention Not Indicated Intervention Not Indicated -- -- --  Housing Interventions -- -- -- -- Intervention Not Indicated --  Transportation Interventions -- -- -- -- Intervention Not  Indicated --  Utilities Interventions Intervention Not Indicated -- -- -- -- --  Financial Strain Interventions -- -- -- -- -- Intervention Not Indicated  Physical Activity Interventions Intervention Not Indicated, Other (Comments)  [patient not physically able to engage in this type of exercise-attending PT] -- -- -- -- --  Stress Interventions -- -- -- Intervention Not Indicated -- --  Health Literacy Interventions -- -- -- Intervention Not Indicated -- --     Care Plan Allergies  Allergen Reactions   Hydrocodone-Acetaminophen Other (See Comments)   Orange Oil Hives    Pt describes reaction as big bumps   Other     tomatoes   Citrus Hives and Rash    Pt describes reaction as big bumps   Clindamycin/Lincomycin Hives and Rash   Meloxicam Itching and Rash   Penicillins Hives, Rash and Other (See Comments)    Medications Reviewed Today     Reviewed by [name], RN (Registered Nurse) on 11/08/22 at 1050  Med List Status: <None>   Medication Order Taking? Sig Documenting Provider Last Dose Status Informant  amLODipine (NORVASC) 10 MG tablet [id number] No Take 1 tablet (10 mg total) by mouth daily. [name], MD Taking Active Self  cetirizine (ZYRTEC) 10 MG tablet [id number] No Take 1 tablet by mouth once daily [name], MD Taking Active Self  cyclobenzaprine (FLEXERIL) 10 MG tablet [id number] No Take 1 tablet (10 mg total) by mouth 3 (three) times daily as needed for muscle spasms. Nooruddin, Jason Fila, MD Taking Active   diclofenac Sodium (VOLTAREN) 1 % GEL [id number] No Apply 4 g topically 4 (four) times daily. [name], MD Taking Active   FLUoxetine (PROZAC) 40 MG capsule [id number] No Take 1 capsule by mouth once daily [name], MD Taking Active   fluticasone [facility]) 50 MCG/ACT nasal spray  [id number] No Place 2 sprays into both nostrils daily as needed for allergies. [provider] Taking Active Self  fluticasone-salmeterol (ADVAIR HFA)  130-86 MCG/ACT inhaler [id number] No Inhale 2 puffs into the lungs 2 (two) times daily. Masters, [name], DO Taking Active   hydrochlorothiazide (MICROZIDE) 12.5 MG capsule [id number] No Take 1 capsule (12.5 mg total) by mouth daily. [name], MD Taking Active Self  ibuprofen (ADVIL) 800 MG tablet [id number] No Take 1 tablet (800 mg total) by mouth every 6 (six) hours as needed for fever or moderate pain. [name], MD Taking Active   ipratropium-albuterol (DUONEB) 0.5-2.5 (3) MG/3ML SOLN [id number] No Take by nebulization. [provider] Taking Active   Lido-Capsaicin-Men-Methyl Sal (1ST MEDX-PATCH/ LIDOCAINE) 4-0.025-5-20 % PTCH [id number] No Apply 1 patch topically daily. [name], MD Taking Active Self  Mouthwashes [facility] DRY MOUTH GENTLE) LIQD [id number] No Use as directed 5 mLs in the mouth or throat daily. Please rinse your mouth with 5 mL Carrie Slater, DO Taking Active   olmesartan (BENICAR) 40 MG tablet [id number] No Take 1 tablet (40 mg total) by mouth daily. [name], MD Taking Active Self  oxyCODONE-acetaminophen (PERCOCET) 5-325 MG tablet [id number] No Take 1 tablet by mouth every 6 (six) hours as needed for severe pain. [name], NP Taking Active   pantoprazole (PROTONIX) 40 MG tablet [id number] No Take 1 tablet (40 mg total) by mouth daily. [name], MD Taking Active Self  predniSONE (DELTASONE) 10 MG tablet [id number]  TAKE AS DIRECTED FOR 6 DAYS [provider]  Active   pregabalin (LYRICA) 100 MG capsule [id number]  Take 1 capsule (100 mg total) by mouth 2 (two) times daily. Mercie Eon, MD  Active   triamcinolone ointment (KENALOG) 0.1 % [id number] No APPLY  THIN LAYER TOPICALLY TWICE DAILY AS NEEDED [name], MD Taking Active   valACYclovir (VALTREX) 500 MG tablet [id number] No Take 1 tablet (500 mg total) by mouth daily.  Patient taking differently: Take 500 mg by mouth daily as needed (outbreaks).    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   Rhomboid muscle strain 02/14/2022   Preoperative clearance 02/01/2022   Thrombocytopenia (HCC) 02/01/2022   Epistaxis 11/15/2021   Thyroid nodule 12/01/2020   Headache 10/02/2020   Cervical radiculopathy 08/12/2020   Aortic atherosclerosis (HCC) 02/17/2020   Emphysema of lung (HCC) 02/17/2020   Neuropathic pain 11/26/2019   Strain of thoracic paraspinal muscles excluding T1 and T2 levels 04/18/2018   Onychomycosis 12/04/2017   Radiculopathy of lumbosacral region 06/24/2017   Depression 01/02/2017   Osteoarthritis 12/05/2015   Arthritis of right knee 12/05/2015   Trigeminal neuralgia of right side of face 11/09/2015   Eczema 04/01/2015   Chronic neck pain 03/08/2015   History of colonic polyps 09/07/2014   Normocytic anemia 09/07/2014   Hyperlipidemia 07/31/2013   GERD (gastroesophageal reflux disease) 03/19/2013   Cerumen impaction 08/07/2012   Obesity (BMI 30.0-34.9) 08/05/2012   Right thigh pain 07/17/2012   Vaginal discharge 06/22/2011   Persistent asthma 02/23/2010   Healthcare maintenance 02/23/2010   Essential hypertension 01/13/2007   History of herpes genitalis 01/02/2006  Allergic sinusitis 01/02/2006   Conditions to be addressed/monitored per PCP order:  Chronic healthcare management needs, HLD, HTN, chronic pain, polyneuropathy, asthma, emphysema, GERD, osteoarthritis, radiculitis, spondylosis, depression  Care Plan : RN Care Manager Plan of Care  Updates made by [name], RN since 11/08/2022 12:00 AM     Problem: Health Promotion or Disease Self-Management (General Plan of Care)       Long-Range Goal: Chronic Disease Management   Start Date: 03/16/2022  Expected End Date: 01/08/2023  Priority: High  Note:   Current Barriers:  Knowledge Deficits related to plan of care for management of HTN, asthma, emphysema, GERD, osteoarthritis, HLD, depression, chronic pain  Chronic Disease Management support and education needs related to HTN, asthma, emphysema, GERD, osteoarthritis, HLD, depression, chronic pain  11/08/22:  BP stable-checks occasionally.  Seen by Nutrition and eye provider.  To schedule PT appts for back.  RNCM Clinical Goal(s):  Patient will verbalize understanding of plan for management of  HTN, asthma, emphysema, GERD, osteoarthritis, HLD, depression, chronic pain  as evidenced by patient report verbalize basic understanding of  HTN, asthma, emphysema, GERD, osteoarthritis, HLD, depression, chronic pain  disease process and self health management plan as evidenced by patient report take all medications exactly as prescribed and will call provider for medication related questions as evidenced by patient report demonstrate understanding of rationale for each prescribed medication as evidenced by patient report attend all scheduled medical appointments  as evidenced by patient report demonstrate ongoing adherence to prescribed treatment plan for  as evidenced by patient report and EMR review continue to work with RN Care Manager to address care management and care coordination needs related to HTN, asthma, emphysema, GERD, osteoarthritis, HLD, depression, chronic pain  as evidenced by adherence to CM Team Scheduled appointments through collaboration with RN Care manager, provider, and care team.   Interventions: Inter-disciplinary care team collaboration (see longitudinal plan of care) Evaluation of current treatment plan related to  self management and patient's adherence to plan as established by provider  Asthma: (Status:New goal.) Long Term Goal Advised patient  to track and manage Asthma triggers Advised patient to self assesses Asthma action plan zone and make appointment with provider if in the yellow zone for 48 hours without improvement Provided education about and advised patient to utilize infection prevention strategies to reduce risk of respiratory infection Discussed the importance of adequate rest and management of fatigue with Asthma Assessed social determinant of health barriers   Hyperlipidemia Interventions:  (Status:  New goal.) Long Term Goal Medication review performed; medication list updated in electronic medical record.  Provider established cholesterol goals reviewed Counseled on importance of regular laboratory monitoring as prescribed Reviewed importance of limiting foods high in cholesterol Assessed social determinant of health barriers   Hypertension Interventions:  (Status:  New goal.) Long Term Goal Last practice recorded BP readings:  BP Readings from Last 3 Encounters:           10/08/22         125/74 11/08/22         139/80 08/07/22         128/68  Most recent eGFR/CrCl:  Lab Results  Component Value Date   EGFR 93 11/15/2021    No components found for: "CRCL"  Evaluation of current treatment plan related to hypertension self management and patient's adherence to plan as established by provider Reviewed medications with patient and discussed importance of compliance Discussed plans with patient for ongoing care management follow  up and provided patient with direct contact information for care management team Advised patient, providing education and rationale, to monitor blood pressure daily and record, calling PCP for findings outside established parameters Reviewed scheduled/upcoming provider appointments  Assessed social determinant of health barriers  Pain Interventions:  (Status:  New goal.) Long Term Goal Pain assessment performed Medications reviewed Reviewed provider established plan for pain  management Discussed importance of adherence to all scheduled medical appointments Counseled on the importance of reporting any/all new or changed pain symptoms or management strategies to pain management provider Advised patient to report to care team affect of pain on daily activities Discussed use of relaxation techniques and/or diversional activities to assist with pain reduction (distraction, imagery, relaxation, massage, acupressure, TENS, heat, and cold application Reviewed with patient prescribed pharmacological and nonpharmacological pain relief strategies Assessed social determinant of health barriers  Surgery  (Status: New goal.) Long Term Goal Evaluation of current treatment plan related to knee replacement surgery assessed patient/caregiver understanding of surgical procedure   reviewed post-operative instructions with patient/caregiver reviewed medications with patient and addressed questions reviewed scheduled provider appointments with patient confirmed availability of transportation to all appointment  Patient Goals/Self-Care Activities: Take all medications as prescribed Attend all scheduled provider appointments Call pharmacy for medication refills 3-7 days in advance of running out of medications Perform all self care activities independently  Perform IADL's (shopping, preparing meals, housekeeping, managing finances) independently Call provider office for new concerns or questions  To f/u with provider regarding right hand xray-completed 11/08/22:  patient to schedule PT appts.  Follow Up Plan:  The patient has been provided with contact information for the care management team and has been advised to call with any health related questions or concerns.  The care management team will reach out to the patient again over the next 60 business  days.    Long-Range Goal: Establish Plan of Care for Chronic Disease Management Needs   Priority: High  Note:   Timeframe:   Long-Range Goal Priority:  High Start Date:   03/16/22                          Expected End Date:  ongoing                     Follow Up Date 01/08/23   - practice safe sex - schedule appointment for vaccines needed due to my age or health - schedule recommended health tests (blood work, mammogram, colonoscopy, pap test) - schedule and keep appointment for annual check-up    Why is this important?   Screening tests can find diseases early when they are easier to treat.  Your doctor or nurse will talk with you about which tests are important for you.  Getting shots for common diseases like the flu and shingles will help prevent them.  11/08/22:  To schedule PT, seen by eye provider yesterday. Seen by Nutrition 10/7   Follow Up:  Patient agrees to Care Plan and Follow-up.  Plan: The Managed Medicaid care management team will reach out to the patient again over the next 60 business  days. and The  Patient has been provided with contact information for the Managed Medicaid care management team and has been advised to call with any health related questions or concerns.  Date/time of next scheduled RN care management/care coordination outreach: 01/08/23

## 2022-11-13 ENCOUNTER — Other Ambulatory Visit: Payer: Self-pay | Admitting: Student

## 2022-11-13 NOTE — Addendum Note (Signed)
Addended by: Modena Slater on: 11/13/2022 02:09 PM   Modules accepted: Orders

## 2022-11-15 ENCOUNTER — Encounter: Payer: Self-pay | Admitting: Registered Nurse

## 2022-11-15 ENCOUNTER — Encounter: Payer: Medicare HMO | Admitting: Registered Nurse

## 2022-11-15 VITALS — BP 120/78 | HR 84 | Ht 63.0 in | Wt 220.0 lb

## 2022-11-15 DIAGNOSIS — M1711 Unilateral primary osteoarthritis, right knee: Secondary | ICD-10-CM

## 2022-11-15 DIAGNOSIS — G894 Chronic pain syndrome: Secondary | ICD-10-CM

## 2022-11-15 DIAGNOSIS — Z79891 Long term (current) use of opiate analgesic: Secondary | ICD-10-CM | POA: Diagnosis not present

## 2022-11-15 DIAGNOSIS — Z5181 Encounter for therapeutic drug level monitoring: Secondary | ICD-10-CM | POA: Diagnosis not present

## 2022-11-15 DIAGNOSIS — G629 Polyneuropathy, unspecified: Secondary | ICD-10-CM

## 2022-11-15 DIAGNOSIS — M47816 Spondylosis without myelopathy or radiculopathy, lumbar region: Secondary | ICD-10-CM | POA: Diagnosis not present

## 2022-11-15 MED ORDER — OXYCODONE-ACETAMINOPHEN 5-325 MG PO TABS: 1.0000 | ORAL_TABLET | Freq: Four times a day (QID) | ORAL | 0 refills | Status: DC | PRN
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
## 2022-11-19 ENCOUNTER — Telehealth: Payer: Self-pay | Admitting: *Deleted

## 2022-11-19 NOTE — Telephone Encounter (Signed)
Call from patient asking why she cannot get strips and Lancets for her Meter.  Patient was again informed that she is on no medications that would affect her sugar levels.  Patient was also informed that her HbA1C is 6.7 and that she just barely crossed over to be a Diabetic.  Her Diabetes could be controlled by her diet.  Patient was told to check her sugar at least 1 time a week.  Patient was also told that  she may not qualify for her insurance to pay for the Lancets and strips since she is on no medications that would affect her sugars Will forward to her PCP to see if she will reconsider or talk to patient about.

## 2022-11-19 NOTE — Telephone Encounter (Signed)
Gabby from [facility] is calling about paperwork they faxed for patient to obtain an CGM. Patient does not qualify for CGM paid for by her insurance and doctor does not think she needs to check blood sugar. [facility] was informed of the same.

## 2022-12-11 ENCOUNTER — Encounter: Payer: Self-pay | Admitting: Registered Nurse

## 2022-12-11 ENCOUNTER — Encounter: Payer: Medicare HMO | Attending: Physical Medicine and Rehabilitation | Admitting: Registered Nurse

## 2022-12-11 VITALS — BP 128/79 | HR 91 | Ht 63.0 in | Wt 224.0 lb

## 2022-12-11 DIAGNOSIS — G894 Chronic pain syndrome: Secondary | ICD-10-CM | POA: Diagnosis not present

## 2022-12-11 DIAGNOSIS — Z79891 Long term (current) use of opiate analgesic: Secondary | ICD-10-CM | POA: Diagnosis not present

## 2022-12-11 DIAGNOSIS — Z5181 Encounter for therapeutic drug level monitoring: Secondary | ICD-10-CM | POA: Diagnosis not present

## 2022-12-11 DIAGNOSIS — M47816 Spondylosis without myelopathy or radiculopathy, lumbar region: Secondary | ICD-10-CM | POA: Insufficient documentation

## 2022-12-11 DIAGNOSIS — M170 Bilateral primary osteoarthritis of knee: Secondary | ICD-10-CM | POA: Insufficient documentation

## 2022-12-11 DIAGNOSIS — M792 Neuralgia and neuritis, unspecified: Secondary | ICD-10-CM | POA: Diagnosis not present

## 2022-12-11 MED ORDER — OXYCODONE-ACETAMINOPHEN 5-325 MG PO TABS: 1.0000 | ORAL_TABLET | Freq: Four times a day (QID) | ORAL | 0 refills | Status: DC | PRN
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
## 2022-12-27 ENCOUNTER — Other Ambulatory Visit: Payer: Self-pay | Admitting: Student

## 2023-01-02 ENCOUNTER — Encounter: Payer: Medicare HMO | Attending: Physical Medicine and Rehabilitation | Admitting: Registered Nurse

## 2023-01-02 VITALS — BP 129/75 | HR 95 | Ht 63.0 in | Wt 225.0 lb

## 2023-01-02 DIAGNOSIS — M170 Bilateral primary osteoarthritis of knee: Secondary | ICD-10-CM | POA: Diagnosis not present

## 2023-01-02 DIAGNOSIS — G894 Chronic pain syndrome: Secondary | ICD-10-CM | POA: Diagnosis not present

## 2023-01-02 DIAGNOSIS — M792 Neuralgia and neuritis, unspecified: Secondary | ICD-10-CM | POA: Diagnosis not present

## 2023-01-02 DIAGNOSIS — M47816 Spondylosis without myelopathy or radiculopathy, lumbar region: Secondary | ICD-10-CM | POA: Diagnosis not present

## 2023-01-02 DIAGNOSIS — Z5181 Encounter for therapeutic drug level monitoring: Secondary | ICD-10-CM | POA: Insufficient documentation

## 2023-01-02 DIAGNOSIS — Z79891 Long term (current) use of opiate analgesic: Secondary | ICD-10-CM | POA: Insufficient documentation

## 2023-01-02 MED ORDER — OXYCODONE-ACETAMINOPHEN 5-325 MG PO TABS: 1.0000 | ORAL_TABLET | Freq: Four times a day (QID) | ORAL | 0 refills | Status: DC | PRN
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
## 2023-01-04 ENCOUNTER — Other Ambulatory Visit (HOSPITAL_COMMUNITY)
Admission: RE | Admit: 2023-01-04 | Discharge: 2023-01-04 | Disposition: A | Discharge status: Home / Self Care | Payer: Medicare HMO | Source: Ambulatory Visit | Attending: Internal Medicine | Admitting: Internal Medicine

## 2023-01-04 ENCOUNTER — Ambulatory Visit (INDEPENDENT_AMBULATORY_CARE_PROVIDER_SITE_OTHER): Payer: Medicare HMO | Admitting: Internal Medicine

## 2023-01-04 ENCOUNTER — Encounter: Payer: Self-pay | Admitting: Internal Medicine

## 2023-01-04 VITALS — BP 129/71 | HR 82 | Temp 98.2°F | Ht 63.0 in | Wt 225.6 lb

## 2023-01-04 DIAGNOSIS — R0602 Shortness of breath: Secondary | ICD-10-CM | POA: Diagnosis not present

## 2023-01-04 DIAGNOSIS — E785 Hyperlipidemia, unspecified: Secondary | ICD-10-CM | POA: Diagnosis not present

## 2023-01-04 DIAGNOSIS — H6123 Impacted cerumen, bilateral: Secondary | ICD-10-CM | POA: Diagnosis not present

## 2023-01-04 DIAGNOSIS — T466X5D Adverse effect of antihyperlipidemic and antiarteriosclerotic drugs, subsequent encounter: Secondary | ICD-10-CM | POA: Diagnosis not present

## 2023-01-04 DIAGNOSIS — E119 Type 2 diabetes mellitus without complications: Secondary | ICD-10-CM | POA: Diagnosis not present

## 2023-01-04 DIAGNOSIS — Z1151 Encounter for screening for human papillomavirus (HPV): Secondary | ICD-10-CM | POA: Insufficient documentation

## 2023-01-04 DIAGNOSIS — Z01419 Encounter for gynecological examination (general) (routine) without abnormal findings: Secondary | ICD-10-CM | POA: Insufficient documentation

## 2023-01-04 DIAGNOSIS — G720 Drug-induced myopathy: Secondary | ICD-10-CM | POA: Diagnosis not present

## 2023-01-04 DIAGNOSIS — F331 Major depressive disorder, recurrent, moderate: Secondary | ICD-10-CM

## 2023-01-04 DIAGNOSIS — T466X5A Adverse effect of antihyperlipidemic and antiarteriosclerotic drugs, initial encounter: Secondary | ICD-10-CM

## 2023-01-04 DIAGNOSIS — I10 Essential (primary) hypertension: Secondary | ICD-10-CM | POA: Diagnosis not present

## 2023-01-04 DIAGNOSIS — F32A Depression, unspecified: Secondary | ICD-10-CM

## 2023-01-04 DIAGNOSIS — Z124 Encounter for screening for malignant neoplasm of cervix: Secondary | ICD-10-CM | POA: Insufficient documentation

## 2023-01-04 HISTORY — DX: Drug-induced myopathy: G72.0

## 2023-01-04 HISTORY — DX: Drug-induced myopathy: T46.6X5A

## 2023-01-04 LAB — GLUCOSE, CAPILLARY: Glucose-Capillary: 145 mg/dL — ABNORMAL HIGH (ref 70–99)

## 2023-01-04 LAB — POCT GLYCOSYLATED HEMOGLOBIN (HGB A1C): Hemoglobin A1C: 6.1 % — AB (ref 4.0–5.6)

## 2023-01-04 MED ORDER — FLUTICASONE-SALMETEROL 115-21 MCG/ACT IN AERO
2.0000 | INHALATION_SPRAY | Freq: Two times a day (BID) | RESPIRATORY_TRACT | 11 refills | Status: AC
Start: 2023-01-04 — End: ?

## 2023-01-04 NOTE — Assessment & Plan Note (Signed)
Unable to tolerate multiple statin medications. Will need alternate agent for treatment of hyperlipidemia.

## 2023-01-04 NOTE — Assessment & Plan Note (Signed)
HbA1c last checked 09/2022 6.7%, today 6.1%. Not on medical therapy as OP. She has increased her exercise frequency and has cut out a significant amount of sugar and sodas. She had an eye exam a few weeks ago and had good report. She does not check her blood sugar at home.  Plan: Continue lifestyle modifications. Urine microalbumin/creatinine ratio checked today.

## 2023-01-04 NOTE — Addendum Note (Signed)
Addended byCala Bradford on: 01/04/2023 12:07 PM   Modules accepted: Orders

## 2023-01-04 NOTE — Assessment & Plan Note (Signed)
Bilateral cerumen impaction. Ears flushed with resolution.

## 2023-01-04 NOTE — Assessment & Plan Note (Signed)
Pap smear completed today. 
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
## 2023-01-04 NOTE — Patient Instructions (Signed)
It was a pleasure to care for you today!  I will let you know if your results are abnormal.  Please plan to follow up in about 3 months, or sooner if needed.  My best, Dr. August Saucer

## 2023-01-04 NOTE — Assessment & Plan Note (Signed)
BP 149/77, on recheck 129/71. OP regimen is amlodipine 10 mg daily, hydrochlorothiazide 12.5 mg daily, olmesartan 40 mg daily which she has taken today and is compliant with. BMP last checked 09/2022 with normal electrolytes and renal function. Plan:Continue current regimen. BMP at next OV.

## 2023-01-04 NOTE — Assessment & Plan Note (Signed)
OP regimen is fluoxetine 40 mg daily. She feels her symptoms are well controlled at this time and is interested in possibly deescalating dose to eventually trial being off of it in the new year. Plan:Continue current regimen. Discuss titration off of medication at next OV if this is still what she wishes to do.

## 2023-01-04 NOTE — Assessment & Plan Note (Signed)
No outpatient medical therapies, apparent statin intolerance (failed atorvastatin and pravastatin due to myalgias). Lipid panel last checked 11/2021 with LDL above goal, 140.  Plan:Lipid panel today. Anticipate will need to start bempedoic acid or zetia.

## 2023-01-06 ENCOUNTER — Encounter: Payer: Self-pay | Admitting: Registered Nurse

## 2023-01-06 LAB — LIPID PANEL
Chol/HDL Ratio: 4.6 {ratio} — ABNORMAL HIGH (ref 0.0–4.4)
Cholesterol, Total: 217 mg/dL — ABNORMAL HIGH (ref 100–199)
HDL: 47 mg/dL (ref >39)
LDL Chol Calc (NIH): 142 mg/dL — ABNORMAL HIGH (ref 0–99)
Triglycerides: 154 mg/dL — ABNORMAL HIGH (ref 0–149)
VLDL Cholesterol Cal: 28 mg/dL (ref 5–40)

## 2023-01-07 NOTE — Progress Notes (Signed)
Internal Medicine Clinic Attending  I was physically present during the key portions of the resident provided service and participated in the medical decision making of patient's management care. I reviewed pertinent patient test results.  The assessment, diagnosis, and plan were formulated together and I agree with the documentation in the resident's note.  Gust Rung, DO

## 2023-01-08 ENCOUNTER — Other Ambulatory Visit: Payer: Self-pay | Admitting: Obstetrics and Gynecology

## 2023-01-08 LAB — CYTOLOGY - PAP
Comment: NEGATIVE
Diagnosis: NEGATIVE
High risk HPV: NEGATIVE

## 2023-01-08 NOTE — Patient Instructions (Signed)
Hi Carrie Franco, I am glad you are doing okay today, have a good afternoon!  Happy Holidays!  Carrie Franco was given information about Medicaid Managed Care team care coordination services and verbally consented to engagement with the [facility] Managed Care team.   Carrie Franco - following are the goals we discussed in your visit today:   Goals Addressed    Timeframe:  Long-Range Goal Priority:  High Start Date:   03/16/22                          Expected End Date:  ongoing                     Follow Up Date 02/08/23   - practice safe sex - schedule appointment for vaccines needed due to my age or health - schedule recommended health tests (blood work, mammogram, colonoscopy, pap test) - schedule and keep appointment for annual check-up    Why is this important?   Screening tests can find diseases early when they are easier to treat.  Your doctor or nurse will talk with you about which tests are important for you.  Getting shots for common diseases like the flu and shingles will help prevent them.  01/08/23:  recent PCP and Physical Medicine appts-no PT currently   Patient verbalizes understanding of instructions and care plan provided today and agrees to view in MyChart. Active MyChart status and patient understanding of how to access instructions and care plan via MyChart confirmed with patient.     The Managed Medicaid care management team will reach out to the patient again over the next 60 business  days.  The  Patient                                              has been provided with contact information for the Managed Medicaid care management team and has been advised to call with any health related questions or concerns.   Following is a copy of your plan of care:  Care Plan : RN Care Manager Plan of Care  Updates made by [name], RN since 01/08/2023 12:00 AM     Problem: Health Promotion or Disease Self-Management (General Plan of Care)      Long-Range Goal: Chronic Disease  Management   Start Date: 03/16/2022  Expected End Date: 04/08/2023  Priority: High  Note:   Current Barriers:  Knowledge Deficits related to plan of care for management of HTN, asthma, emphysema, GERD, osteoarthritis, HLD, depression, chronic pain  Chronic Disease Management support and education needs related to HTN, asthma, emphysema, GERD, osteoarthritis, HLD, depression, chronic pain  01/08/23:  Patient managing low back and left foot pain with pain meds, heat and massage-is better.  BP stable.  No complaints today.  RNCM Clinical Goal(s):  Patient will verbalize understanding of plan for management of  HTN, asthma, emphysema, GERD, osteoarthritis, HLD, depression, chronic pain  as evidenced by patient report verbalize basic understanding of  HTN, asthma, emphysema, GERD, osteoarthritis, HLD, depression, chronic pain  disease process and self health management plan as evidenced by patient report take all medications exactly as prescribed and will call provider for medication related questions as evidenced by patient report demonstrate understanding of rationale for each prescribed medication as evidenced by patient report attend all scheduled  medical appointments  as evidenced by patient report demonstrate ongoing adherence to prescribed treatment plan for  as evidenced by patient report and EMR review continue to work with RN Care Manager to address care management and care coordination needs related to HTN, asthma, emphysema, GERD, osteoarthritis, HLD, depression, chronic pain  as evidenced by adherence to CM Team Scheduled appointments through collaboration with RN Care manager, provider, and care team.   Interventions: Inter-disciplinary care team collaboration (see longitudinal plan of care) Evaluation of current treatment plan related to  self management and patient's adherence to plan as established by provider  Asthma: (Status:New goal.) Long Term Goal Advised patient to track and  manage Asthma triggers Advised patient to self assesses Asthma action plan zone and make appointment with provider if in the yellow zone for 48 hours without improvement Provided education about and advised patient to utilize infection prevention strategies to reduce risk of respiratory infection Discussed the importance of adequate rest and management of fatigue with Asthma Assessed social determinant of health barriers   Hyperlipidemia Interventions:  (Status:  New goal.) Long Term Goal Medication review performed; medication list updated in electronic medical record.  Provider established cholesterol goals reviewed Counseled on importance of regular laboratory monitoring as prescribed Reviewed importance of limiting foods high in cholesterol Assessed social determinant of health barriers   Hypertension Interventions:  (Status:  New goal.) Long Term Goal Last practice recorded BP readings:  BP Readings from Last 3 Encounters:           10/08/22         125/74 11/08/22         139/80 01/04/23         129/71  Most recent eGFR/CrCl:  Lab Results  Component Value Date   EGFR 93 11/15/2021    No components found for: "CRCL"  Evaluation of current treatment plan related to hypertension self management and patient's adherence to plan as established by provider Reviewed medications with patient and discussed importance of compliance Discussed plans with patient for ongoing care management follow up and provided patient with direct contact information for care management team Advised patient, providing education and rationale, to monitor blood pressure daily and record, calling PCP for findings outside established parameters Reviewed scheduled/upcoming provider appointments  Assessed social determinant of health barriers  Pain Interventions:  (Status:  New goal.) Long Term Goal Pain assessment performed Medications reviewed Reviewed provider established plan for pain  management Discussed importance of adherence to all scheduled medical appointments Counseled on the importance of reporting any/all new or changed pain symptoms or management strategies to pain management provider Advised patient to report to care team affect of pain on daily activities Discussed use of relaxation techniques and/or diversional activities to assist with pain reduction (distraction, imagery, relaxation, massage, acupressure, TENS, heat, and cold application Reviewed with patient prescribed pharmacological and nonpharmacological pain relief strategies Assessed social determinant of health barriers  Surgery  (Status: New goal.) Long Term Goal Evaluation of current treatment plan related to knee replacement surgery assessed patient/caregiver understanding of surgical procedure   reviewed post-operative instructions with patient/caregiver reviewed medications with patient and addressed questions reviewed scheduled provider appointments with patient confirmed availability of transportation to all appointment  Patient Goals/Self-Care Activities: Take all medications as prescribed Attend all scheduled provider appointments Call pharmacy for medication refills 3-7 days in advance of running out of medications Perform all self care activities independently  Perform IADL's (shopping, preparing meals, housekeeping, managing finances) independently Call provider office  for new concerns or questions  To f/u with provider regarding right hand xray-completed 11/08/22:  patient to schedule PT appts.-patient declines PT at this time.  Follow Up Plan:  The patient has been provided with contact information for the care management team and has been advised to call with any health related questions or concerns.  The care management team will reach out to the patient again over the next 60 business  days.    Kathi Der RN, BSN [place]  [facility] - Managed Medicaid High Risk [phone]

## 2023-01-08 NOTE — Patient Outreach (Signed)
Medicaid Managed Care   Nurse Care Manager Note  01/08/2023 Name:  Carrie Franco MRN:  [id number] DOB:  February 17, 1965  Carrie Franco is an 57 y.o. year old female who is a primary patient of [name], MD.  The [facility] Managed Care Coordination team was consulted for assistance with:    Chronic healthcare management needs, HLD, chronic pain, polyneuropathy, asthma, emphysema, GERD, osteoarthritis, HTN, radiculitis, spondylosis, depression  Carrie Franco was given information about Medicaid Managed Care Coordination team services today. Carrie Franco Patient agreed to services and verbal consent obtained.  Engaged with patient by telephone for follow up visit in response to provider referral for case management and/or care coordination services.   Patient is participating in a Managed Medicaid Plan:  Yes  Assessments/Interventions:  Review of past medical history, allergies, medications, health status, including review of consultants reports, laboratory and other test data, was performed as part of comprehensive evaluation and provision of chronic care management services.  SDOH (Social Drivers of Health) assessments and interventions performed: SDOH Interventions    Flowsheet Row Patient Outreach Telephone from 01/08/2023 in [place] POPULATION HEALTH DEPARTMENT Patient Outreach Telephone from 11/08/2022 in [place] POPULATION HEALTH DEPARTMENT Nutrition from 10/22/2022 in [facility] Nutr Diab Ed  - A Dept Of . [facility] Patient Outreach Telephone from 10/09/2022 in [place] POPULATION HEALTH DEPARTMENT Patient Outreach Telephone from 08/07/2022 in [place] POPULATION HEALTH DEPARTMENT Patient Outreach Telephone from 07/02/2022 in [place] POPULATION HEALTH DEPARTMENT  SDOH Interventions        Food Insecurity Interventions -- -- Intervention Not Indicated Intervention Not Indicated -- --  Housing Interventions -- -- -- -- -- Intervention Not Indicated   Transportation Interventions -- -- -- -- -- Intervention Not Indicated  Utilities Interventions -- Intervention Not Indicated -- -- -- --  Alcohol Usage Interventions Intervention Not Indicated (Score <7) -- -- -- -- --  Physical Activity Interventions -- Intervention Not Indicated, Other (Comments)  [patient not physically able to engage in this type of exercise-attending PT] -- -- -- --  Stress Interventions -- -- -- -- Intervention Not Indicated --  Health Literacy Interventions -- -- -- -- Intervention Not Indicated --     Care Plan Allergies  Allergen Reactions   Hydrocodone-Acetaminophen Other (See Comments)   Orange Oil Hives    Pt describes reaction as big bumps   Other     tomatoes   Citrus Hives and Rash    Pt describes reaction as big bumps   Clindamycin/Lincomycin Hives and Rash   Meloxicam Itching and Rash   Penicillins Hives, Rash and Other (See Comments)   Medications Reviewed Today     Reviewed by [name], RN (Registered Nurse) on 01/08/23 at 1247  Med List Status: <None>   Medication Order Taking? Sig Documenting Provider Last Dose Status Informant  amLODipine (NORVASC) 10 MG tablet [id number] No Take 1 tablet (10 mg total) by mouth daily. [name], MD Taking Active Self  cetirizine (ZYRTEC) 10 MG tablet [id number] No Take 1 tablet by mouth once daily [name], MD Taking Active Self  cyclobenzaprine (FLEXERIL) 10 MG tablet [id number] No Take 1 tablet (10 mg total) by mouth 3 (three) times daily as needed for muscle spasms. Nooruddin, Jason Fila, MD Taking Active   diclofenac Sodium (VOLTAREN) 1 % GEL [id number] No Apply 4 g topically 4 (four) times daily. [name], MD Taking Active   FLUoxetine (PROZAC) 40 MG capsule [id number] No Take 1  capsule by mouth once daily [name], MD Taking Active   fluticasone [facility]) 50 MCG/ACT nasal spray [id number] No Place 2 sprays into both nostrils daily as needed for allergies. [provider] Taking Active Self  fluticasone-salmeterol (ADVAIR HFA) 425-95 MCG/ACT inhaler [id number]  Inhale 2 puffs into the lungs 2 (two) times daily. Champ Mungo, DO  Active   hydrochlorothiazide (MICROZIDE) 12.5 MG capsule [id number] No Take 1 capsule (12.5 mg total) by mouth daily. [name], MD Taking Active Self  ibuprofen (ADVIL) 800 MG tablet [id number] No Take 1 tablet (800 mg total) by mouth every 6 (six) hours as needed for fever or moderate pain. [name], MD Taking Active   ipratropium-albuterol (DUONEB) 0.5-2.5 (3) MG/3ML SOLN [id number] No Take by nebulization. [provider] Taking Active   Lido-Capsaicin-Men-Methyl Sal (1ST MEDX-PATCH/ LIDOCAINE) 4-0.025-5-20 % PTCH [id number] No Apply 1 patch topically daily. [name], MD Taking Active Self  olmesartan (BENICAR) 40 MG tablet [id number] No Take 1 tablet (40 mg total) by mouth daily. [name], MD Taking Active Self  oxyCODONE-acetaminophen (PERCOCET) 5-325 MG tablet [id number]  Take 1 tablet by mouth every 6 (six) hours as needed for severe pain (pain score 7-10). Do Not Fill Before 01/18/2023 [name], NP  Active   pantoprazole (PROTONIX) 40 MG tablet [id number] No Take 1 tablet (40 mg total) by mouth daily. [name], MD Taking Active Self  predniSONE (DELTASONE) 10 MG tablet [id number] No TAKE AS DIRECTED FOR 6 DAYS [provider] Taking Active   pregabalin (LYRICA) 100 MG capsule [id number] No Take 1 capsule by mouth twice daily [name], MD Taking Active   triamcinolone ointment (KENALOG) 0.1 % [id number] No APPLY  THIN LAYER TOPICALLY TWICE DAILY AS NEEDED [name], MD Taking Active   valACYclovir (VALTREX) 500 MG tablet [id number] No Take 1 tablet (500 mg total) by mouth daily.  Patient taking differently: Take 500 mg by mouth daily as needed (outbreaks).   [name], MD Taking Active Self           Patient Active Problem  List   Diagnosis Date Noted   Pap smear for cervical cancer screening 01/04/2023   Statin myopathy 01/04/2023   Type 2 diabetes mellitus (HCC) 09/20/2022   Dry mouth 09/19/2022   Gluteal pain 07/31/2022   Hand pain, right 07/31/2022   Dyspnea 06/29/2022   Epistaxis 11/15/2021   Thyroid nodule 12/01/2020   Headache 10/02/2020   Cervical radiculopathy 08/12/2020   Aortic atherosclerosis (HCC) 02/17/2020   Emphysema of lung (HCC) 02/17/2020   Neuropathic pain 11/26/2019   Onychomycosis 12/04/2017   Radiculopathy of lumbosacral region 06/24/2017   Depression 01/02/2017   Osteoarthritis 12/05/2015   Arthritis of right knee 12/05/2015   Trigeminal neuralgia of right side of face 11/09/2015   Eczema 04/01/2015   Chronic neck pain 03/08/2015   Hyperlipidemia 07/31/2013   GERD (gastroesophageal reflux disease) 03/19/2013   Cerumen impaction 08/07/2012   Obesity (BMI 30.0-34.9) 08/05/2012   Right thigh pain 07/17/2012   Vaginal discharge 06/22/2011   Persistent asthma 02/23/2010   Essential hypertension 01/13/2007   Allergic sinusitis 01/02/2006   Conditions to be addressed/monitored per PCP order:  Chronic healthcare management needs, HLD, chronic pain, polyneuropathy, asthma, emphysema, GERD, osteoarthritis, HTN, radiculitis, spondylosis, depression  Care Plan : RN Care Manager Plan of Care  Updates made by [name], RN since 01/08/2023 12:00 AM     Problem: Health Promotion or Disease Self-Management (General Plan of Care)  Long-Range Goal: Chronic Disease Management   Start Date: 03/16/2022  Expected End Date: 04/08/2023  Priority: High  Note:   Current Barriers:  Knowledge Deficits related to plan of care for management of HTN, asthma, emphysema, GERD, osteoarthritis, HLD, depression, chronic pain  Chronic Disease Management support and education needs related to HTN, asthma, emphysema, GERD, osteoarthritis, HLD, depression, chronic pain  01/08/23:  Patient  managing low back and left foot pain with pain meds, heat and massage-is better.  BP stable.  No complaints today.  RNCM Clinical Goal(s):  Patient will verbalize understanding of plan for management of  HTN, asthma, emphysema, GERD, osteoarthritis, HLD, depression, chronic pain  as evidenced by patient report verbalize basic understanding of  HTN, asthma, emphysema, GERD, osteoarthritis, HLD, depression, chronic pain  disease process and self health management plan as evidenced by patient report take all medications exactly as prescribed and will call provider for medication related questions as evidenced by patient report demonstrate understanding of rationale for each prescribed medication as evidenced by patient report attend all scheduled medical appointments  as evidenced by patient report demonstrate ongoing adherence to prescribed treatment plan for  as evidenced by patient report and EMR review continue to work with RN Care Manager to address care management and care coordination needs related to HTN, asthma, emphysema, GERD, osteoarthritis, HLD, depression, chronic pain  as evidenced by adherence to CM Team Scheduled appointments through collaboration with RN Care manager, provider, and care team.   Interventions: Inter-disciplinary care team collaboration (see longitudinal plan of care) Evaluation of current treatment plan related to  self management and patient's adherence to plan as established by provider  Asthma: (Status:New goal.) Long Term Goal Advised patient to track and manage Asthma triggers Advised patient to self assesses Asthma action plan zone and make appointment with provider if in the yellow zone for 48 hours without improvement Provided education about and advised patient to utilize infection prevention strategies to reduce risk of respiratory infection Discussed the importance of adequate rest and management of fatigue with Asthma Assessed social determinant of  health barriers   Hyperlipidemia Interventions:  (Status:  New goal.) Long Term Goal Medication review performed; medication list updated in electronic medical record.  Provider established cholesterol goals reviewed Counseled on importance of regular laboratory monitoring as prescribed Reviewed importance of limiting foods high in cholesterol Assessed social determinant of health barriers   Hypertension Interventions:  (Status:  New goal.) Long Term Goal Last practice recorded BP readings:  BP Readings from Last 3 Encounters:           10/08/22         125/74 11/08/22         139/80 01/04/23         129/71  Most recent eGFR/CrCl:  Lab Results  Component Value Date   EGFR 93 11/15/2021    No components found for: "CRCL"  Evaluation of current treatment plan related to hypertension self management and patient's adherence to plan as established by provider Reviewed medications with patient and discussed importance of compliance Discussed plans with patient for ongoing care management follow up and provided patient with direct contact information for care management team Advised patient, providing education and rationale, to monitor blood pressure daily and record, calling PCP for findings outside established parameters Reviewed scheduled/upcoming provider appointments  Assessed social determinant of health barriers  Pain Interventions:  (Status:  New goal.) Long Term Goal Pain assessment performed Medications reviewed Reviewed provider established plan for pain  management Discussed importance of adherence to all scheduled medical appointments Counseled on the importance of reporting any/all new or changed pain symptoms or management strategies to pain management provider Advised patient to report to care team affect of pain on daily activities Discussed use of relaxation techniques and/or diversional activities to assist with pain reduction (distraction, imagery, relaxation,  massage, acupressure, TENS, heat, and cold application Reviewed with patient prescribed pharmacological and nonpharmacological pain relief strategies Assessed social determinant of health barriers  Surgery  (Status: New goal.) Long Term Goal Evaluation of current treatment plan related to knee replacement surgery assessed patient/caregiver understanding of surgical procedure   reviewed post-operative instructions with patient/caregiver reviewed medications with patient and addressed questions reviewed scheduled provider appointments with patient confirmed availability of transportation to all appointment  Patient Goals/Self-Care Activities: Take all medications as prescribed Attend all scheduled provider appointments Call pharmacy for medication refills 3-7 days in advance of running out of medications Perform all self care activities independently  Perform IADL's (shopping, preparing meals, housekeeping, managing finances) independently Call provider office for new concerns or questions  To f/u with provider regarding right hand xray-completed 11/08/22:  patient to schedule PT appts.-patient declines PT at this time.  Follow Up Plan:  The patient has been provided with contact information for the care management team and has been advised to call with any health related questions or concerns.  The care management team will reach out to the patient again over the next 60 business  days.    Long-Range Goal: Establish Plan of Care for Chronic Disease Management Needs   Priority: High  Note:   Timeframe:  Long-Range Goal Priority:  High Start Date:   03/16/22                          Expected End Date:  ongoing                     Follow Up Date 02/08/23   - practice safe sex - schedule appointment for vaccines needed due to my age or health - schedule recommended health tests (blood work, mammogram, colonoscopy, pap test) - schedule and keep appointment for annual check-up    Why is  this important?   Screening tests can find diseases early when they are easier to treat.  Your doctor or nurse will talk with you about which tests are important for you.  Getting shots for common diseases like the flu and shingles will help prevent them.  01/08/23:  recent PCP and Physical Medicine appts-no PT currently    Follow Up:  Patient agrees to Care Plan and Follow-up.  Plan: The Managed Medicaid care management team will reach out to the patient again over the next 60 business  days. and The  Patient has been provided with contact information for the Managed Medicaid care management team and has been advised to call with any health related questions or concerns.  Date/time of next scheduled RN care management/care coordination outreach:02/08/23 at 1230

## 2023-01-14 ENCOUNTER — Other Ambulatory Visit: Payer: Self-pay | Admitting: Internal Medicine

## 2023-01-14 DIAGNOSIS — G720 Drug-induced myopathy: Secondary | ICD-10-CM

## 2023-01-14 DIAGNOSIS — E785 Hyperlipidemia, unspecified: Secondary | ICD-10-CM

## 2023-01-14 MED ORDER — BEMPEDOIC ACID 180 MG PO TABS: 180.0000 mg | ORAL_TABLET | Freq: Every day | ORAL | 2 refills | Status: DC

## 2023-01-22 NOTE — Telephone Encounter (Signed)
 Oxycodone was last refilled 01/02/23.

## 2023-01-29 ENCOUNTER — Other Ambulatory Visit: Payer: Self-pay | Admitting: Student

## 2023-01-29 ENCOUNTER — Telehealth: Payer: Self-pay | Admitting: Registered Nurse

## 2023-01-29 NOTE — Telephone Encounter (Signed)
 Patient needing a letter for Jury Duty--please call her.

## 2023-01-31 ENCOUNTER — Other Ambulatory Visit: Payer: Self-pay

## 2023-01-31 ENCOUNTER — Telehealth: Payer: Self-pay | Admitting: Registered Nurse

## 2023-01-31 NOTE — Telephone Encounter (Signed)
Jury Letter

## 2023-01-31 NOTE — Telephone Encounter (Signed)
OPEN ERROR

## 2023-02-08 ENCOUNTER — Other Ambulatory Visit: Payer: Self-pay | Admitting: Obstetrics and Gynecology

## 2023-02-08 NOTE — Patient Instructions (Signed)
Hi Carrie Franco, glad you are staying warm, have a good afternoon and weekend!  Carrie Franco was given information about Medicaid Managed Care team care coordination services and verbally consented to engagement with the [facility] Managed Care team.   Carrie Franco - following are the goals we discussed in your visit today:   Goals Addressed     Timeframe:  Long-Range Goal Priority:  High Start Date:   03/16/22                          Expected End Date:  ongoing                     Follow Up Date 04/08/23   - practice safe sex - schedule appointment for vaccines needed due to my age or health - schedule recommended health tests (blood work, mammogram, colonoscopy, pap test) - schedule and keep appointment for annual check-up    Why is this important?   Screening tests can find diseases early when they are easier to treat.  Your doctor or nurse will talk with you about which tests are important for you.  Getting shots for common diseases like the flu and shingles will help prevent them.  02/08/23:  Followed by pain mgt monthly.  Patient verbalizes understanding of instructions and care plan provided today and agrees to view in MyChart. Active MyChart status and patient understanding of how to access instructions and care plan via MyChart confirmed with patient.     The Managed Medicaid care management team will reach out to the patient again over the next 60 business  days.  The  Patient  has been provided with contact information for the Managed Medicaid care management team and has been advised to call with any health related questions or concerns.   Following is a copy of your plan of care:  Care Plan : RN Care Manager Plan of Care  Updates made by [name], RN since 02/08/2023 12:00 AM     Problem: Health Promotion or Disease Self-Management (General Plan of Care)      Long-Range Goal: Chronic Disease Management   Start Date: 03/16/2022  Expected End Date: 04/08/2023  Priority: High   Note:   Current Barriers:  Knowledge Deficits related to plan of care for management of HTN, asthma, emphysema, GERD, osteoarthritis, HLD, depression, chronic pain  Chronic Disease Management support and education needs related to HTN, asthma, emphysema, GERD, osteoarthritis, HLD, depression, chronic pain  02/08/23:  Patient doing well today, monthly pain mgt appt.  BP stable.  No breathing issues.  RNCM Clinical Goal(s):  Patient will verbalize understanding of plan for management of  HTN, asthma, emphysema, GERD, osteoarthritis, HLD, depression, chronic pain  as evidenced by patient report verbalize basic understanding of  HTN, asthma, emphysema, GERD, osteoarthritis, HLD, depression, chronic pain  disease process and self health management plan as evidenced by patient report take all medications exactly as prescribed and will call provider for medication related questions as evidenced by patient report demonstrate understanding of rationale for each prescribed medication as evidenced by patient report attend all scheduled medical appointments  as evidenced by patient report demonstrate ongoing adherence to prescribed treatment plan for  as evidenced by patient report and EMR review continue to work with RN Care Manager to address care management and care coordination needs related to HTN, asthma, emphysema, GERD, osteoarthritis, HLD, depression, chronic pain  as evidenced by adherence to CM  Team Scheduled appointments through collaboration with RN Care manager, provider, and care team.   Interventions: Inter-disciplinary care team collaboration (see longitudinal plan of care) Evaluation of current treatment plan related to  self management and patient's adherence to plan as established by provider  Asthma: (Status:New goal.) Long Term Goal Advised patient to track and manage Asthma triggers Advised patient to self assesses Asthma action plan zone and make appointment with provider if in the  yellow zone for 48 hours without improvement Provided education about and advised patient to utilize infection prevention strategies to reduce risk of respiratory infection Discussed the importance of adequate rest and management of fatigue with Asthma Assessed social determinant of health barriers   Hyperlipidemia Interventions:  (Status:  New goal.) Long Term Goal Medication review performed; medication list updated in electronic medical record.  Provider established cholesterol goals reviewed Counseled on importance of regular laboratory monitoring as prescribed Reviewed importance of limiting foods high in cholesterol Assessed social determinant of health barriers   Hypertension Interventions:  (Status:  New goal.) Long Term Goal Last practice recorded BP readings:  BP Readings from Last 3 Encounters:           02/08/23         132/78 11/08/22         139/80 01/04/23         129/71  Most recent eGFR/CrCl:  Lab Results  Component Value Date   EGFR 93 11/15/2021    No components found for: "CRCL"  Evaluation of current treatment plan related to hypertension self management and patient's adherence to plan as established by provider Reviewed medications with patient and discussed importance of compliance Discussed plans with patient for ongoing care management follow up and provided patient with direct contact information for care management team Advised patient, providing education and rationale, to monitor blood pressure daily and record, calling PCP for findings outside established parameters Reviewed scheduled/upcoming provider appointments  Assessed social determinant of health barriers  Pain Interventions:  (Status:  New goal.) Long Term Goal Pain assessment performed Medications reviewed Reviewed provider established plan for pain management Discussed importance of adherence to all scheduled medical appointments Counseled on the importance of reporting any/all new or  changed pain symptoms or management strategies to pain management provider Advised patient to report to care team affect of pain on daily activities Discussed use of relaxation techniques and/or diversional activities to assist with pain reduction (distraction, imagery, relaxation, massage, acupressure, TENS, heat, and cold application Reviewed with patient prescribed pharmacological and nonpharmacological pain relief strategies Assessed social determinant of health barriers  Surgery  (Status: New goal.) Long Term Goal Evaluation of current treatment plan related to knee replacement surgery assessed patient/caregiver understanding of surgical procedure   reviewed post-operative instructions with patient/caregiver reviewed medications with patient and addressed questions reviewed scheduled provider appointments with patient confirmed availability of transportation to all appointment  Patient Goals/Self-Care Activities: Take all medications as prescribed Attend all scheduled provider appointments Call pharmacy for medication refills 3-7 days in advance of running out of medications Perform all self care activities independently  Perform IADL's (shopping, preparing meals, housekeeping, managing finances) independently Call provider office for new concerns or questions  To f/u with provider regarding right hand xray-completed 11/08/22:  patient to schedule PT appts.-patient declines PT at this time.  Follow Up Plan:  The patient has been provided with contact information for the care management team and has been advised to call with any health related questions or concerns.  The care management team will reach out to the patient again over the next 60 business  days.   Kathi Der RN, BSN, [facility] RN Care Manager Direct Dial [phone]/ONG [phone] Website: [email or web address]

## 2023-02-08 NOTE — Patient Outreach (Signed)
Medicaid Managed Care   Nurse Care Manager Note  02/08/2023 Name:  Carrie Franco MRN:  [id number] DOB:  1965-10-17  Carrie Franco is an 58 y.o. year old female who is a primary patient of Carrie Crocker, MD.  The [facility] Managed Care Coordination team was consulted for assistance with:    Chronic healthcare management needs, HLD, chronic pain, polyneuropathy, asthma, emphysema, GERD, osteoarthritis, radiculitis, HTN, spondylosis, depression  Carrie Franco was given information about Medicaid Managed Care Coordination team services today. Carrie Franco Patient agreed to services and verbal consent obtained.  Engaged with patient by telephone for follow up visit in response to provider referral for case management and/or care coordination services.   Patient is participating in a Managed Medicaid Plan:  no  Assessments/Interventions:  Review of past medical history, allergies, medications, health status, including review of consultants reports, laboratory and other test data, was performed as part of comprehensive evaluation and provision of chronic care management services.  SDOH (Social Drivers of Health) assessments and interventions performed: SDOH Interventions    Flowsheet Row Patient Outreach Telephone from 02/08/2023 in [place] [facility] POPULATION HEALTH DEPARTMENT Patient Outreach Telephone from 01/08/2023 in [place] POPULATION HEALTH DEPARTMENT Patient Outreach Telephone from 11/08/2022 in [place] POPULATION HEALTH DEPARTMENT Nutrition from 10/22/2022 in [place] [facility] Nutr Diab Ed  - A Dept Of [place] Patient Outreach Telephone from 10/09/2022 in [place] POPULATION HEALTH DEPARTMENT Patient Outreach Telephone from 08/07/2022 in  POPULATION HEALTH DEPARTMENT  SDOH Interventions        Food Insecurity Interventions -- -- -- Intervention Not Indicated Intervention Not Indicated --  Housing Interventions Intervention Not Indicated -- -- -- -- --   Utilities Interventions -- -- Intervention Not Indicated -- -- --  Alcohol Usage Interventions -- Intervention Not Indicated (Score <7) -- -- -- --  Financial Strain Interventions Intervention Not Indicated -- -- -- -- --  Physical Activity Interventions -- -- Intervention Not Indicated, Other (Comments)  [patient not physically able to engage in this type of exercise-attending PT] -- -- --  Stress Interventions -- -- -- -- -- Intervention Not Indicated  Health Literacy Interventions -- -- -- -- -- Intervention Not Indicated     Care Plan Allergies  Allergen Reactions   Hydrocodone-Acetaminophen Other (See Comments)   Orange Oil Hives    Pt describes reaction as big bumps   Other     tomatoes   Citrus Hives and Rash    Pt describes reaction as big bumps   Clindamycin/Lincomycin Hives and Rash   Meloxicam Itching and Rash   Penicillins Hives, Rash and Other (See Comments)    Medications Reviewed Today     Reviewed by [name], RN (Registered Nurse) on 02/08/23 at 1229  Med List Status: <None>   Medication Order Taking? Sig Documenting Provider Last Dose Status Informant  amLODipine (NORVASC) 10 MG tablet [id number] No Take 1 tablet (10 mg total) by mouth daily. Carrie Crocker, MD Taking Active Self  Bempedoic Acid 180 MG TABS [id number]  Take 1 tablet (180 mg total) by mouth daily in the afternoon. Champ Mungo, DO  Active   cetirizine (ZYRTEC) 10 MG tablet [id number] No Take 1 tablet by mouth once daily Carrie Abbot, MD Taking Active Self  cyclobenzaprine (FLEXERIL) 10 MG tablet [id number] No Take 1 tablet (10 mg total) by mouth 3 (three) times daily as needed for muscle spasms. Nooruddin, Jason Fila, MD Taking Active   diclofenac Sodium (VOLTAREN) 1 %  GEL [id number] No Apply 4 g topically 4 (four) times daily. Carrie Crocker, MD Taking Active   FLUoxetine (PROZAC) 40 MG capsule [id number] No Take 1 capsule by mouth once daily Carrie Simmonds, MD Taking Active    fluticasone [facility]) 50 MCG/ACT nasal spray [id number] No Place 2 sprays into both nostrils daily as needed for allergies. [provider] Taking Active Self  fluticasone-salmeterol (ADVAIR HFA) 132-44 MCG/ACT inhaler [id number]  Inhale 2 puffs into the lungs 2 (two) times daily. Champ Mungo, DO  Active   hydrochlorothiazide (MICROZIDE) 12.5 MG capsule [id number] No Take 1 capsule (12.5 mg total) by mouth daily. Carrie Crocker, MD Taking Expired 01/27/23 2359 Self  ibuprofen (ADVIL) 800 MG tablet [id number] No Take 1 tablet (800 mg total) by mouth every 6 (six) hours as needed for fever or moderate pain. Carrie Sickles, MD Taking Active   ipratropium-albuterol (DUONEB) 0.5-2.5 (3) MG/3ML SOLN [id number] No Take by nebulization. [provider] Taking Active   Lido-Capsaicin-Men-Methyl Sal (1ST MEDX-PATCH/ LIDOCAINE) 4-0.025-5-20 % PTCH [id number] No Apply 1 patch topically daily. Carrie Abbot, MD Taking Active Self  olmesartan (BENICAR) 40 MG tablet [id number] No Take 1 tablet (40 mg total) by mouth daily. Carrie Crocker, MD Taking Expired 01/27/23 2359 Self  oxyCODONE-acetaminophen (PERCOCET) 5-325 MG tablet [id number]  Take 1 tablet by mouth every 6 (six) hours as needed for severe pain (pain score 7-10). Do Not Fill Before 01/18/2023 Carrie Bales, NP  Active   pantoprazole (PROTONIX) 40 MG tablet [id number] No Take 1 tablet (40 mg total) by mouth daily. Carrie Crocker, MD Taking Expired 02/01/23 2359 Self  predniSONE (DELTASONE) 10 MG tablet [id number] No TAKE AS DIRECTED FOR 6 DAYS [provider] Taking Active   pregabalin (LYRICA) 100 MG capsule [id number]  Take 1 capsule by mouth twice daily Carrie Crocker, MD  Active   triamcinolone ointment (KENALOG) 0.1 % [id number] No APPLY  THIN LAYER TOPICALLY TWICE DAILY AS NEEDED Carrie Crocker, MD Taking Active   valACYclovir (VALTREX) 500 MG tablet [id number] No Take 1 tablet (500 mg total) by  mouth daily.  Patient taking differently: Take 500 mg by mouth daily as needed (outbreaks).   Carrie Qua, MD Taking Active Self           Patient Active Problem List   Diagnosis Date Noted   Pap smear for cervical cancer screening 01/04/2023   Statin myopathy 01/04/2023   Type 2 diabetes mellitus (HCC) 09/20/2022   Dry mouth 09/19/2022   Gluteal pain 07/31/2022   Hand pain, right 07/31/2022   Dyspnea 06/29/2022   Epistaxis 11/15/2021   Thyroid nodule 12/01/2020   Headache 10/02/2020   Cervical radiculopathy 08/12/2020   Aortic atherosclerosis (HCC) 02/17/2020   Emphysema of lung (HCC) 02/17/2020   Neuropathic pain 11/26/2019   Onychomycosis 12/04/2017   Radiculopathy of lumbosacral region 06/24/2017   Depression 01/02/2017   Osteoarthritis 12/05/2015   Arthritis of right knee 12/05/2015   Trigeminal neuralgia of right side of face 11/09/2015   Eczema 04/01/2015   Chronic neck pain 03/08/2015   Hyperlipidemia 07/31/2013   GERD (gastroesophageal reflux disease) 03/19/2013   Cerumen impaction 08/07/2012   Obesity (BMI 30.0-34.9) 08/05/2012   Right thigh pain 07/17/2012   Vaginal discharge 06/22/2011   Persistent asthma 02/23/2010   Essential hypertension 01/13/2007   Allergic sinusitis 01/02/2006   Conditions to be addressed/monitored per PCP order:  Chronic healthcare management needs, HLD, chronic pain, polyneuropathy, asthma, emphysema, GERD, osteoarthritis, radiculitis, HTN, spondylosis, depression  Care Plan : RN  Care Manager Plan of Care  Updates made by [name], RN since 02/08/2023 12:00 AM     Problem: Health Promotion or Disease Self-Management (General Plan of Care)      Long-Range Goal: Chronic Disease Management   Start Date: 03/16/2022  Expected End Date: 04/08/2023  Priority: High  Note:   Current Barriers:  Knowledge Deficits related to plan of care for management of HTN, asthma, emphysema, GERD, osteoarthritis, HLD, depression, chronic pain   Chronic Disease Management support and education needs related to HTN, asthma, emphysema, GERD, osteoarthritis, HLD, depression, chronic pain  02/08/23:  Patient doing well today, monthly pain mgt appt.  BP stable.  No breathing issues.  RNCM Clinical Goal(s):  Patient will verbalize understanding of plan for management of  HTN, asthma, emphysema, GERD, osteoarthritis, HLD, depression, chronic pain  as evidenced by patient report verbalize basic understanding of  HTN, asthma, emphysema, GERD, osteoarthritis, HLD, depression, chronic pain  disease process and self health management plan as evidenced by patient report take all medications exactly as prescribed and will call provider for medication related questions as evidenced by patient report demonstrate understanding of rationale for each prescribed medication as evidenced by patient report attend all scheduled medical appointments  as evidenced by patient report demonstrate ongoing adherence to prescribed treatment plan for  as evidenced by patient report and EMR review continue to work with RN Care Manager to address care management and care coordination needs related to HTN, asthma, emphysema, GERD, osteoarthritis, HLD, depression, chronic pain  as evidenced by adherence to CM Team Scheduled appointments through collaboration with RN Care manager, provider, and care team.   Interventions: Inter-disciplinary care team collaboration (see longitudinal plan of care) Evaluation of current treatment plan related to  self management and patient's adherence to plan as established by provider  Asthma: (Status:New goal.) Long Term Goal Advised patient to track and manage Asthma triggers Advised patient to self assesses Asthma action plan zone and make appointment with provider if in the yellow zone for 48 hours without improvement Provided education about and advised patient to utilize infection prevention strategies to reduce risk of respiratory  infection Discussed the importance of adequate rest and management of fatigue with Asthma Assessed social determinant of health barriers   Hyperlipidemia Interventions:  (Status:  New goal.) Long Term Goal Medication review performed; medication list updated in electronic medical record.  Provider established cholesterol goals reviewed Counseled on importance of regular laboratory monitoring as prescribed Reviewed importance of limiting foods high in cholesterol Assessed social determinant of health barriers   Hypertension Interventions:  (Status:  New goal.) Long Term Goal Last practice recorded BP readings:  BP Readings from Last 3 Encounters:           02/08/23         132/78 11/08/22         139/80 01/04/23         129/71  Most recent eGFR/CrCl:  Lab Results  Component Value Date   EGFR 93 11/15/2021    No components found for: "CRCL"  Evaluation of current treatment plan related to hypertension self management and patient's adherence to plan as established by provider Reviewed medications with patient and discussed importance of compliance Discussed plans with patient for ongoing care management follow up and provided patient with direct contact information for care management team Advised patient, providing education and rationale, to monitor blood pressure daily and record, calling PCP for findings outside established parameters Reviewed scheduled/upcoming provider appointments  Assessed social determinant of health barriers  Pain Interventions:  (Status:  New goal.) Long Term Goal Pain assessment performed Medications reviewed Reviewed provider established plan for pain management Discussed importance of adherence to all scheduled medical appointments Counseled on the importance of reporting any/all new or changed pain symptoms or management strategies to pain management provider Advised patient to report to care team affect of pain on daily activities Discussed use  of relaxation techniques and/or diversional activities to assist with pain reduction (distraction, imagery, relaxation, massage, acupressure, TENS, heat, and cold application Reviewed with patient prescribed pharmacological and nonpharmacological pain relief strategies Assessed social determinant of health barriers  Surgery  (Status: New goal.) Long Term Goal Evaluation of current treatment plan related to knee replacement surgery assessed patient/caregiver understanding of surgical procedure   reviewed post-operative instructions with patient/caregiver reviewed medications with patient and addressed questions reviewed scheduled provider appointments with patient confirmed availability of transportation to all appointment  Patient Goals/Self-Care Activities: Take all medications as prescribed Attend all scheduled provider appointments Call pharmacy for medication refills 3-7 days in advance of running out of medications Perform all self care activities independently  Perform IADL's (shopping, preparing meals, housekeeping, managing finances) independently Call provider office for new concerns or questions  To f/u with provider regarding right hand xray-completed 11/08/22:  patient to schedule PT appts.-patient declines PT at this time.  Follow Up Plan:  The patient has been provided with contact information for the care management team and has been advised to call with any health related questions or concerns.  The care management team will reach out to the patient again over the next 60 business  days.    Long-Range Goal: Establish Plan of Care for Chronic Disease Management Needs   Priority: High  Note:   Timeframe:  Long-Range Goal Priority:  High Start Date:   03/16/22                          Expected End Date:  ongoing                     Follow Up Date 04/08/23   - practice safe sex - schedule appointment for vaccines needed due to my age or health - schedule recommended  health tests (blood work, mammogram, colonoscopy, pap test) - schedule and keep appointment for annual check-up    Why is this important?   Screening tests can find diseases early when they are easier to treat.  Your doctor or nurse will talk with you about which tests are important for you.  Getting shots for common diseases like the flu and shingles will help prevent them.  02/08/23:  Followed by pain mgt monthly.   Follow Up:  Patient agrees to Care Plan and Follow-up.  Plan: The Managed Medicaid care management team will reach out to the patient again over the next 60 business  days. and The  Patient has been provided with contact information for the Managed Medicaid care management team and has been advised to call with any health related questions or concerns.  Date/time of next scheduled RN care management/care coordination outreach: 04/08/23 at 1230.

## 2023-02-09 ENCOUNTER — Other Ambulatory Visit: Payer: Self-pay | Admitting: Internal Medicine

## 2023-02-09 DIAGNOSIS — J309 Allergic rhinitis, unspecified: Secondary | ICD-10-CM
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
## 2023-02-12 ENCOUNTER — Encounter: Payer: Medicare HMO | Attending: Physical Medicine and Rehabilitation | Admitting: Registered Nurse

## 2023-02-12 ENCOUNTER — Ambulatory Visit: Payer: Medicare HMO | Admitting: Registered Nurse

## 2023-02-12 ENCOUNTER — Encounter: Payer: Self-pay | Admitting: Registered Nurse

## 2023-02-12 VITALS — BP 128/77 | HR 85 | Ht 63.0 in | Wt 227.0 lb

## 2023-02-12 DIAGNOSIS — Z79891 Long term (current) use of opiate analgesic: Secondary | ICD-10-CM | POA: Insufficient documentation

## 2023-02-12 DIAGNOSIS — M47816 Spondylosis without myelopathy or radiculopathy, lumbar region: Secondary | ICD-10-CM | POA: Diagnosis not present

## 2023-02-12 DIAGNOSIS — Z5181 Encounter for therapeutic drug level monitoring: Secondary | ICD-10-CM | POA: Insufficient documentation

## 2023-02-12 DIAGNOSIS — M170 Bilateral primary osteoarthritis of knee: Secondary | ICD-10-CM | POA: Insufficient documentation

## 2023-02-12 DIAGNOSIS — G894 Chronic pain syndrome: Secondary | ICD-10-CM | POA: Diagnosis not present

## 2023-02-12 DIAGNOSIS — M792 Neuralgia and neuritis, unspecified: Secondary | ICD-10-CM | POA: Insufficient documentation

## 2023-02-12 MED ORDER — OXYCODONE-ACETAMINOPHEN 5-325 MG PO TABS: 1.0000 | ORAL_TABLET | Freq: Four times a day (QID) | ORAL | 0 refills | Status: DC | PRN

## 2023-02-14 LAB — TOXASSURE SELECT,+ANTIDEPR,UR

## 2023-02-21 ENCOUNTER — Ambulatory Visit: Payer: Medicare HMO | Admitting: Student

## 2023-02-21 VITALS — BP 131/70 | HR 85 | Ht 63.0 in | Wt 227.4 lb

## 2023-02-21 DIAGNOSIS — Z6841 Body Mass Index (BMI) 40.0 and over, adult: Secondary | ICD-10-CM

## 2023-02-21 DIAGNOSIS — E66813 Obesity, class 3: Secondary | ICD-10-CM | POA: Diagnosis not present

## 2023-02-21 DIAGNOSIS — Z6837 Body mass index (BMI) 37.0-37.9, adult: Secondary | ICD-10-CM | POA: Insufficient documentation

## 2023-02-21 DIAGNOSIS — E119 Type 2 diabetes mellitus without complications: Secondary | ICD-10-CM | POA: Diagnosis not present

## 2023-02-21 MED ORDER — SEMAGLUTIDE(0.25 OR 0.5MG/DOS) 2 MG/3ML ~~LOC~~ SOPN: 0.2500 mg | PEN_INJECTOR | SUBCUTANEOUS | 2 refills | Status: DC

## 2023-02-21 MED ORDER — WEGOVY 0.25 MG/0.5ML ~~LOC~~ SOAJ: 0.2500 mg | SUBCUTANEOUS | 2 refills | Status: DC

## 2023-02-21 NOTE — Patient Instructions (Addendum)
 Thank you, [name] for allowing us  to provide your care today.  I have ordered the following tests for you:  Lab Orders         Microalbumin / Creatinine Urine Ratio        Referrals ordered today:   Referral Orders         Referral to Nutrition and Diabetes Services       I have ordered the following medication/changed the following medications:   Start the following medications: Meds ordered this encounter  Medications   DISCONTD: Semaglutide ,0.25 or 0.5MG /DOS, 2 MG/3ML SOPN    Sig: Inject 0.25 mg into the skin once a week.    Dispense:  3 mL    Refill:  2   Semaglutide -Weight Management (WEGOVY ) 0.25 MG/0.5ML SOAJ    Sig: Inject 0.25 mg into the skin once a week.    Dispense:  2 mL    Refill:  2      Follow up:  1 month telehealth  for Ozempic  titration    Recommendations for weight loss and blood glucose control.   1) Cut out liquid sources of calories such as soda, juices, sports drinks, alcohol , sweet teas, and coffee with added sugars. These are empty calories meaning they bring your blood sugar up and add weight to your body without making you feel full.   2) Avoid food with added sugars. Some examples would be cakes, candies, and sauces (ie. ketchup and BBQ). You may be surprised how much sugar they sneak into processed foods.   3) Replace foods high in starches such as pasta, rice, potatoes, or breads with foods higher in protein such as meat.  Protein rich foods are more satiating - meaning they make you feel more full than carbohydrates. They also provide the body with important nutrients that we need. You can read nutritional labels to get more information regarding how much protein, carbohydrates, and fats foods contain.    We look forward to seeing you next time. Please call our clinic at [phone] if you have any questions or concerns. The best time to call is Monday-Friday from 9am-4pm, but there is someone available 24/7. If after hours or  the weekend, call the main hospital number and ask for the Internal Medicine Resident On-Call. If you need medication refills, please notify your pharmacy one week in advance and they will send us  a request.   Thank you for trusting me with your care. Wishing you the best!  Trilby Europe MD [facility]
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
## 2023-02-21 NOTE — Assessment & Plan Note (Addendum)
 Weight 227 pounds today, BMI greater than 40.  Patient's weight has been slowly uptrending for last 3 to 4 years.  She would be interested in trying pharmacological management for her obesity.  Her activity has been limited due to knee pain, and is now status post total knee replacement.  She is still having some difficulty following this procedure.    Denies nausea, vomiting, abdominal pain, diarrhea, or constipation.  No family or personal history of thyroid  cancer. Plan: Will attempt to initiate Wegovy  0.25 mg weekly Diabetes and nutritional services referral Discussed lifestyle modification and dietary advice.

## 2023-02-21 NOTE — Assessment & Plan Note (Signed)
 Last A1c 6.1.  Not currently on any pharmacologic agents for diabetes.  Has cut down on her soda drinking, previously drinking 3 Mountain Dew's per day.  Does not have much of a sweet tooth, has met with [facility] in the past for diabetic nutritional services.  Would be interested in meeting with her again. Plan: Microalbumin creatinine ratio today. Lifestyle modifications discussed Referral for nutrition and diabetes services

## 2023-02-22 ENCOUNTER — Telehealth: Payer: Self-pay

## 2023-02-22 NOTE — Telephone Encounter (Signed)
 Prior Authorization for patient (Wegovy  0.25MG /0.5ML auto-injectors) came through on cover my meds was submitted awaiting approval or denial.  [id number]

## 2023-02-22 NOTE — Progress Notes (Signed)
 Internal Medicine Clinic Attending  Case discussed with the resident physician at the time of the visit.  We reviewed the patient's history, exam, and pertinent patient test results.  I agree with the assessment, diagnosis, and plan of care documented in the resident's note.

## 2023-02-25 NOTE — Telephone Encounter (Signed)
 Decision:Denied  Date: 02/22/2023 Enrollee Name: Carrie Franco Member Number: [id number] Coverage of your drug was denied We denied coverage under Medicare Part D for the following drug(s) you or your prescribing provider asked  for: WEGOVY  Soln Auto-inj Why was coverage for this drug denied? We denied coverage for this drug because: Your plan's Medicare Part D drug plan cannot cover drugs  used for loss of appetite (anorexia), weight loss, or weight gain. Your Medicare Part D drug plan was  asked to cover the requested drug. The requested drug is used for loss of appetite (anorexia), weight loss,  or to help you gain weight. Under section 1927(d)(2) of the Social Security Act, drugs used for loss of  appetite (anorexia), weight loss, or weight gain are excluded from coverage under the Medicare Part D  drug benefit. Since the requested indication is excluded from Medicare Part D coverage, the request for  coverage under your Medicare Part D benefit is denied.

## 2023-03-02 ENCOUNTER — Other Ambulatory Visit: Payer: Self-pay | Admitting: Student

## 2023-03-04 NOTE — Telephone Encounter (Signed)
Last OV 02/21/23. Next OV  04/24/23. TOX 02/12/23.

## 2023-03-12 ENCOUNTER — Ambulatory Visit (INDEPENDENT_AMBULATORY_CARE_PROVIDER_SITE_OTHER): Payer: Medicare HMO | Admitting: Dietician

## 2023-03-12 VITALS — Wt 223.6 lb

## 2023-03-12 DIAGNOSIS — E119 Type 2 diabetes mellitus without complications: Secondary | ICD-10-CM

## 2023-03-12 NOTE — Patient Instructions (Addendum)
 Thank you for your visit today!  It is healthy to lose 1-2 pounds a week. If you lose too much you can lose too much muscle and this not good.    What exercise I did For how many minutes  Monday    Tuesday    Wednesday    Thursday    Friday    Saturday    Sunday         The goal is to 30 minutes a day.   Your Exercise Prescription:   RX:  At least 150 minutes a week of moderate intensity exercise. Walking, bicycling, stationary bicycling, dancing.  (moderate intensity means to get  a little out of breath)  Do resistance exercise at least 2 times a week.  This can be yoga poses or strength training where you lift your own weight (think leg lifts or toe raises)  or light weights like cans of beans with your arms.   Flexibility and balance exercise- safe stretching and practicing balance is very important to health.   Call anytime you have questions or concerns.  Lupita Leash [phone]

## 2023-03-12 NOTE — Progress Notes (Signed)
 Medical Nutrition Therapy:  Appt start time: 1316 end time:  1355. Total time: 39 Visit # 1  Assessment:  Primary concerns today: weight loss healthy diet assistance. She is referred for both her diabetes and her desire for weight loss. She has started her ozempic and taken two doses so far. .  She states she is active and has decreased all soda and sweets in the past month.Her son and son in law are active people and encourage her activity. She currently rides stationary bike for 30 minutes a month and walks for 15 minutes twice a a week. She does her own cooking and shopping. Her children and grandchildren live in the area.  150# is  her long term weight goal weight Preferred Learning Style:No preference indicated  Learning Readiness: Change in progress  ANTHROPOMETRICS: Estimated body mass index is 39.61 kg/m as calculated from the following:   Height as of 02/21/23: 5\' 3"  (1.6 m).   Weight as of this encounter: 223 lb 9.6 oz (101.4 kg).  WEIGHT HISTORY: 185#-199# from 2008 to 214 notes several times she had lost weight and regained it. Gained 20 pounds since 2019 Wt Readings from Last 10 Encounters:  03/12/23 223 lb 9.6 oz (101.4 kg)  02/21/23 227 lb 6.4 oz (103.1 kg)  02/12/23 227 lb (103 kg)  01/04/23 225 lb 9.6 oz (102.3 kg)  01/02/23 225 lb (102.1 kg)  12/11/22 224 lb (101.6 kg)  11/15/22 220 lb (99.8 kg)  10/22/22 222 lb 6.4 oz (100.9 kg)  10/08/22 221 lb 9.6 oz (100.5 kg)  10/02/22 223 lb (101.2 kg)    SLEEP: states that she Does not sleep well 11-3-4 awakes to urinate, then cannot go back to sleep until 5 or so. Tired at today's visit MEDICATIONS: Ozempic Fridays for two weeks, a little nausea ( for which she took nausea pills)  that went away and has not returned.  She states it is decreasing her appetite. BLOOD SUGAR: does not check at home and does know what her A1c is and means Lab Results  Component Value Date   HGBA1C 6.1 (A) 01/04/2023   HGBA1C 6.7 (H) 09/19/2022    HGBA1C 5.4 02/06/2017   HGBA1C 5.4 02/17/2014   HGBA1C 4.7 10/03/2012     DIETARY INTAKE: Usual eating pattern includes 3 meals and 2 snacks per day. Everyday foods include fruits.  Avoided foods include pork and soda and sweets.   Nausea: no,  Vomiting: no, Diarrhea: no, Constipation: says this has been lifelong problem for her for which she takes a stool softener daily,  Dining Out (times/week): no 24-hr recall:  B ( AM): juice, cheerios,  milk, fruit  Lunch:  she states two vegetables and a starch and sometimes meat Snk ( PM): apple D ( PM): cabbage, mac and cheese and greens Snk ( PM): apple Beverages: water, juice   Estimated daily energy needs for weight loss: 1200-1400 calories  Progress Towards Goal(s):  In progress.   Nutritional Diagnosis:  NB-1.1 Food and nutrition-related knowledge deficit As related to lack of sufficient training in diabetes and weight loss.  As evidenced by her report.    Intervention:  Nutrition education about calories, weight loss, what type 2 diabetes is and how it is managed, importance of physical activity, self monitoring, support. . Action Goal: increase activity to eventually get 150 minutes of physical activity per week  Outcome goal: increased knowledge about weight loss, diabetes and minimum physical activity Coordination of care:  none  Teaching  Method Utilized: Visual, Auditory,Hands on Handouts given during visit include: After visit summary and Building a balanced meal Barriers to learning/adherence to lifestyle change: lack of support, self monitoring plan Demonstrated degree of understanding via:  Teach Back   Monitoring/Evaluation:  Dietary intake, exercise, and body weight in 1 year(s). Norm Parcel, RD 03/12/2023 2:31 PM.
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
## 2023-03-14 ENCOUNTER — Other Ambulatory Visit (HOSPITAL_COMMUNITY)
Admission: RE | Admit: 2023-03-14 | Discharge: 2023-03-14 | Disposition: A | Discharge status: Home / Self Care | Payer: Medicare HMO | Source: Ambulatory Visit | Attending: Internal Medicine | Admitting: Internal Medicine

## 2023-03-14 ENCOUNTER — Encounter: Payer: Self-pay | Admitting: Registered Nurse

## 2023-03-14 ENCOUNTER — Encounter: Payer: Medicare HMO | Attending: Physical Medicine and Rehabilitation | Admitting: Registered Nurse

## 2023-03-14 ENCOUNTER — Ambulatory Visit: Payer: Medicare HMO | Admitting: Student

## 2023-03-14 ENCOUNTER — Encounter: Payer: Self-pay | Admitting: Student

## 2023-03-14 VITALS — BP 130/78 | HR 85 | Ht 63.0 in | Wt 223.0 lb

## 2023-03-14 VITALS — BP 136/79 | HR 80 | Ht 63.0 in | Wt 229.0 lb

## 2023-03-14 DIAGNOSIS — M47816 Spondylosis without myelopathy or radiculopathy, lumbar region: Secondary | ICD-10-CM | POA: Diagnosis not present

## 2023-03-14 DIAGNOSIS — N760 Acute vaginitis: Secondary | ICD-10-CM | POA: Diagnosis not present

## 2023-03-14 DIAGNOSIS — G894 Chronic pain syndrome: Secondary | ICD-10-CM | POA: Diagnosis not present

## 2023-03-14 DIAGNOSIS — Z79891 Long term (current) use of opiate analgesic: Secondary | ICD-10-CM | POA: Diagnosis not present

## 2023-03-14 DIAGNOSIS — N898 Other specified noninflammatory disorders of vagina: Secondary | ICD-10-CM | POA: Diagnosis not present

## 2023-03-14 DIAGNOSIS — B9689 Other specified bacterial agents as the cause of diseases classified elsewhere: Secondary | ICD-10-CM | POA: Diagnosis not present

## 2023-03-14 DIAGNOSIS — Z5181 Encounter for therapeutic drug level monitoring: Secondary | ICD-10-CM | POA: Diagnosis not present

## 2023-03-14 DIAGNOSIS — E119 Type 2 diabetes mellitus without complications: Secondary | ICD-10-CM

## 2023-03-14 DIAGNOSIS — M170 Bilateral primary osteoarthritis of knee: Secondary | ICD-10-CM | POA: Diagnosis not present

## 2023-03-14 MED ORDER — OXYCODONE-ACETAMINOPHEN 5-325 MG PO TABS: 1.0000 | ORAL_TABLET | Freq: Four times a day (QID) | ORAL | 0 refills | Status: DC | PRN

## 2023-03-14 NOTE — Assessment & Plan Note (Addendum)
 Patient presents with vaginal discharge for the past week or so.  She says that it is very similar to her previous episodes of bacterial vaginosis.  She describes the discharge as whitish/clearish in color, foul odored, and has a little itching.  She has not been sexually active in over a year.  She does not have any systemic infectious symptoms.  She is currently in menopause.  She denies any dysuria or changes in urinary frequency.  She does not have any other symptoms at this time.  We have collected a vaginal self swab, which we will test for trichomonas, Candida, bacterial vaginosis, and gonorrhea/chlamydia.

## 2023-03-14 NOTE — Progress Notes (Signed)
    CC: Acute Concern of vaginal discharge   HPI:  Carrie Franco is a 58 y.o. female with pertinent PMH of BV, MDD, HTN, T2DM who presents to the clinic for vaginal discharge. Please see assessment and plan below for further details.  Review of Systems:   Pertinent items noted in HPI and/or A&P.  Physical Exam:  Vitals:   03/14/23 1437  BP: 136/79  Pulse: 80  SpO2: 99%  Weight: 229 lb (103.9 kg)  Height: 5\' 3"  (1.6 m)   Well-appearing middle-aged woman in no acute distress Regular heart rate and rhythm, no murmurs, euvolemic Lungs clear to auscultation bilaterally, no increased work of breathing No abdominal tenderness or distention Vaginal exam deferred  Assessment & Plan:   Vaginal discharge Patient presents with vaginal discharge for the past week or so.  She says that it is very similar to her previous episodes of bacterial vaginosis.  She describes the discharge as whitish/clearish in color, foul odored, and has a little itching.  She has not been sexually active in over a year.  She does not have any systemic infectious symptoms.  She is currently in menopause.  She denies any dysuria or changes in urinary frequency.  She does not have any other symptoms at this time.  We have collected a vaginal self swab, which we will test for trichomonas, Candida, bacterial vaginosis, and gonorrhea/chlamydia.   Patient discussed with Dr. Marney Doctor, MD [facility] Internal Medicine Resident PGY-1 Clinic Phone: [phone] Pager: [phone]

## 2023-03-14 NOTE — Patient Instructions (Signed)
 Thank you, [name] for allowing [place] to provide your care today.  We have collected a vaginal self swab.  I will call you soon as we have the results.  Follow up: as needed  We look forward to seeing you next time. Please call our clinic at [phone] if you have any questions or concerns. The best time to call is Monday-Friday from 9am-4pm, but there is someone available 24/7. If after hours or the weekend, call the main hospital number and ask for the Internal Medicine Resident On-Call. If you need medication refills, please notify your pharmacy one week in advance and they will send [place] a request.   Thank you for trusting me with your care. Wishing you the best!   Annett Fabian, MD [facility]

## 2023-03-17 LAB — CERVICOVAGINAL ANCILLARY ONLY
Bacterial Vaginitis (gardnerella): POSITIVE — AB
Candida Glabrata: NEGATIVE
Candida Vaginitis: NEGATIVE
Chlamydia: NEGATIVE
Comment: NEGATIVE
Comment: NEGATIVE
Comment: NEGATIVE
Comment: NEGATIVE
Comment: NEGATIVE
Comment: NORMAL
Neisseria Gonorrhea: NEGATIVE
Trichomonas: NEGATIVE

## 2023-03-18 ENCOUNTER — Other Ambulatory Visit: Payer: Self-pay | Admitting: Student

## 2023-03-18 MED ORDER — METRONIDAZOLE 500 MG PO TABS: 500.0000 mg | ORAL_TABLET | Freq: Two times a day (BID) | ORAL | 0 refills | Status: AC

## 2023-03-20 ENCOUNTER — Other Ambulatory Visit: Payer: Self-pay | Admitting: Student

## 2023-03-20 DIAGNOSIS — K219 Gastro-esophageal reflux disease without esophagitis: Secondary | ICD-10-CM

## 2023-03-20 DIAGNOSIS — I10 Essential (primary) hypertension: Secondary | ICD-10-CM

## 2023-03-20 NOTE — Progress Notes (Signed)
 Internal Medicine Clinic Attending  Case discussed with the resident at the time of the visit.  We reviewed the resident's history and exam and pertinent patient test results.  I agree with the assessment, diagnosis, and plan of care documented in the resident's note.

## 2023-03-20 NOTE — Addendum Note (Signed)
 Addended by: Dickie La on: 03/20/2023 10:09 AM   Modules accepted: Level of Service

## 2023-04-04 ENCOUNTER — Ambulatory Visit: Admitting: Internal Medicine

## 2023-04-04 VITALS — BP 138/82 | HR 86 | Ht 63.0 in | Wt 226.2 lb

## 2023-04-04 DIAGNOSIS — I10 Essential (primary) hypertension: Secondary | ICD-10-CM

## 2023-04-04 DIAGNOSIS — E66813 Obesity, class 3: Secondary | ICD-10-CM | POA: Diagnosis not present

## 2023-04-04 DIAGNOSIS — E119 Type 2 diabetes mellitus without complications: Secondary | ICD-10-CM | POA: Diagnosis not present

## 2023-04-04 DIAGNOSIS — Z7985 Long-term (current) use of injectable non-insulin antidiabetic drugs: Secondary | ICD-10-CM | POA: Diagnosis not present

## 2023-04-04 DIAGNOSIS — Z6841 Body Mass Index (BMI) 40.0 and over, adult: Secondary | ICD-10-CM

## 2023-04-04 MED ORDER — OZEMPIC (0.25 OR 0.5 MG/DOSE) 2 MG/1.5ML ~~LOC~~ SOPN: 0.5000 mg | PEN_INJECTOR | SUBCUTANEOUS | 3 refills | Status: DC

## 2023-04-04 NOTE — Assessment & Plan Note (Signed)
 She would like to increase Ozempic to help with weight loss goals.  She has been tolerating Ozempic 0.25 mg weekly.  Will increase dose to 0.5 mg weekly.

## 2023-04-04 NOTE — Patient Instructions (Signed)
 Thank you, [name] for allowing [place] to provide your care today.   I have sent in refill of ozempic. Come back in June for diabetes blood work and urine test.  I have ordered the following medication/changed the following medications:   Stop the following medications: Medications Discontinued During This Encounter  Medication Reason   Semaglutide-Weight Management (WEGOVY) 0.25 MG/0.5ML SOAJ      Start the following medications: Meds ordered this encounter  Medications   Semaglutide,0.25 or 0.5MG /DOS, (OZEMPIC, 0.25 OR 0.5 MG/DOSE,) 2 MG/1.5ML SOPN    Sig: Inject 0.5 mg into the skin once a week.    Dispense:  1.5 mL    Refill:  3     Follow up: 3 months   We look forward to seeing you next time. Please call our clinic at [phone] if you have any questions or concerns. The best time to call is Monday-Friday from 9am-4pm, but there is someone available 24/7. If after hours or the weekend, call the main hospital number and ask for the Internal Medicine Resident On-Call. If you need medication refills, please notify your pharmacy one week in advance and they will send [place] a request.   Thank you for trusting me with your care. Wishing you the best!   Rudene Christians, DO [facility]

## 2023-04-04 NOTE — Assessment & Plan Note (Signed)
 Pressure elevated from 143/85 to 138/82.  She reports that when she checks her blood pressure at home her systolics are in the 120s.  Medications include amlodipine 10 mg, hydrochlorothiazide 12.5 mg and olmesartan 40 mg.  She states that she took this medication this morning.  She feels like she is more stressed today as she is trying to make it to a funeral in [place] and is worried about being late. P: Follow-up in June.  Would consider medication titration if blood pressures remain elevated at follow-up.  He also has NSAIDs on her medication list.  Would verify how often she is taking this medications.

## 2023-04-04 NOTE — Assessment & Plan Note (Signed)
 Last A1c in January at 6.1.  Her A1c is always been less than 7.  She is appropriate to have her A1c checked every 6 months. P: Unable to provide urine microalbumin to creatinine sample today as she needs to get get to a funeral but would get this at follow-up in June Repeat A1c in June Increase Ozempic from 0.25 mg to 0.5 mg
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
## 2023-04-08 ENCOUNTER — Other Ambulatory Visit: Payer: Self-pay | Admitting: Student

## 2023-04-08 ENCOUNTER — Ambulatory Visit: Payer: Self-pay | Admitting: Obstetrics and Gynecology

## 2023-04-08 NOTE — Telephone Encounter (Signed)
 LOV 04/04/23.

## 2023-04-09 ENCOUNTER — Other Ambulatory Visit: Payer: Self-pay | Admitting: Student

## 2023-04-09 ENCOUNTER — Ambulatory Visit: Payer: Self-pay

## 2023-04-09 ENCOUNTER — Telehealth: Payer: Self-pay

## 2023-04-09 MED ORDER — PREGABALIN 100 MG PO CAPS: 100.0000 mg | ORAL_CAPSULE | Freq: Two times a day (BID) | ORAL | 0 refills | Status: DC

## 2023-04-09 NOTE — Patient Outreach (Addendum)
 Care Coordination   Follow Up Visit Note   04/09/2023 Name: Carrie Franco MRN: [id number] DOB: Apr 02, 1965  Carrie Franco is a 58 y.o. year old female who sees [name], MD for primary care. I spoke with  Ronita Hipps by phone today.  What matters to the patients health and wellness today?     On a visit on 3/20 with Carrie Franco, a 58 year old patient at her physician. Her blood pressure was measured at 132 over 82, her cholesterol level was recorded at 217, and her A1C value was 6.1. [name] indicated that her primary concerns are weight management and her cholesterol levels.  We engaged in a discussion regarding her dietary habits and reviewed her current medications. Additionally, we addressed her allergies. [name] reported that she exercises at home, engages in walking, maintains adequate hydration, and adheres to her prescribed cholesterol medication. Although her physician has advised her to take the medication daily in the afternoon, she has opted to take it in the morning due to concerns that it disrupts her sleep. I urged her to communicate this adjustment to her physician, to which she agreed.            Goals Addressed             This Visit's Progress    Patient Stated-mpnitor and manage my cholestorol and weight       Current Barriers:  Poorly controlled hyperlipidemia, complicated by statin intolerance Current antihyperlipidemic regimen: Bempedoic Acid 180 mg daily Most recent lipid panel:     Component Value Date/Time   CHOL 217 (H) 01/04/2023 1208   TRIG 154 (H) 01/04/2023 1208   HDL 47 01/04/2023 1208   CHOLHDL 4.6 (H) 01/04/2023 1208   CHOLHDL 3.8 02/17/2014 1543   VLDL 17 02/17/2014 1543   LDLCALC 142 (H) 01/04/2023 1208   ASCVD risk enhancing conditions: age >26, DM, HTN, CKD, CHF, current smoker  patient will take all medications exactly as prescribed and will call provider for medication related questions patient will attend all  scheduled medical appointments: patient will demonstrate improved health management independence  Interventions: Medication review performed; medication list updated in electronic medical record.  Inter-disciplinary care team collaboration (see longitudinal plan of care) Evaluation of current treatment plan related to  patient's adherence to plan as established by provider. Reviewed medications with patient and discussed  Advised patient, providing education and rationale, to check cbg and record, calling  for findings outside established parameters.     Patient Goals/Self-Care Activities:   - change to whole grain breads, cereal, pasta - drink 6 to 8 glasses of water each day - eat 3 to 5 servings of fruits and vegetables each day - eat 5 or 6 small meals each day - fill half the plate with nonstarchy vegetables - keep a food diary - limit fast food meals to no more than 1 per week - prepare main meal at home 3 to 5 days each week - read food labels for fat, fiber, carbohydrates and portion size - set a realistic goal              SDOH assessments and interventions completed:  Yes  SDOH Interventions Today    Flowsheet Row Most Recent Value  SDOH Interventions   Food Insecurity Interventions Intervention Not Indicated  Housing Interventions Intervention Not Indicated  Transportation Interventions Intervention Not Indicated  Utilities Interventions Intervention Not Indicated        Care Coordination Interventions:  Yes, provided   Interventions Today    Flowsheet Row Most Recent Value  Chronic Disease   Chronic disease during today's visit Hypertension (HTN), Other  [weight loss]  General Interventions   General Interventions Discussed/Reviewed General Interventions Discussed, General Interventions Reviewed, Doctor Visits, Labs, Lipid Profile  Labs Hgb A1c every 3 months  Doctor Visits Discussed/Reviewed Doctor Visits Discussed, Doctor Visits Reviewed   Exercise Interventions   Exercise Discussed/Reviewed Exercise Discussed  Nutrition Interventions   Nutrition Discussed/Reviewed Nutrition Discussed, Nutrition Reviewed, Adding fruits and vegetables, Fluid intake  Pharmacy Interventions   Pharmacy Dicussed/Reviewed Pharmacy Topics Discussed, Pharmacy Topics Reviewed, Medications and their functions  Safety Interventions   Safety Discussed/Reviewed Safety Discussed        Follow up plan: Follow up call scheduled for 04/26/23  11 am    Encounter Outcome:  Patient Visit Completed   Juanell Fairly RN, BSN, [facility] [place]  [facility], [facility]  Care Coordinator Phone: [phone]

## 2023-04-09 NOTE — Telephone Encounter (Signed)
 done

## 2023-04-09 NOTE — Patient Instructions (Signed)
 Visit Information  Thank you for taking time to visit with me today. Please don't hesitate to contact me if I can be of assistance to you.   Following are the goals we discussed today:   Goals Addressed             This Visit's Progress    Patient Stated-mpnitor and manage my cholestorol and weight       Current Barriers:  Poorly controlled hyperlipidemia, complicated by statin intolerance Current antihyperlipidemic regimen: Bempedoic Acid 180 mg daily Most recent lipid panel:     Component Value Date/Time   CHOL 217 (H) 01/04/2023 1208   TRIG 154 (H) 01/04/2023 1208   HDL 47 01/04/2023 1208   CHOLHDL 4.6 (H) 01/04/2023 1208   CHOLHDL 3.8 02/17/2014 1543   VLDL 17 02/17/2014 1543   LDLCALC 142 (H) 01/04/2023 1208   ASCVD risk enhancing conditions: age >65, DM, HTN, CKD, CHF, current smoker  patient will take all medications exactly as prescribed and will call provider for medication related questions patient will attend all scheduled medical appointments: patient will demonstrate improved health management independence  Interventions: Medication review performed; medication list updated in electronic medical record.  Inter-disciplinary care team collaboration (see longitudinal plan of care) Evaluation of current treatment plan related to  patient's adherence to plan as established by provider. Reviewed medications with patient and discussed  Advised patient, providing education and rationale, to check cbg and record, calling  for findings outside established parameters.     Patient Goals/Self-Care Activities:   - change to whole grain breads, cereal, pasta - drink 6 to 8 glasses of water each day - eat 3 to 5 servings of fruits and vegetables each day - eat 5 or 6 small meals each day - fill half the plate with nonstarchy vegetables - keep a food diary - limit fast food meals to no more than 1 per week - prepare main meal at home 3 to 5 days each week - read food  labels for fat, fiber, carbohydrates and portion size - set a realistic goal              Our next appointment is by telephone on 04/26/23 at 11 am  Please call the care guide team at [phone] if you need to cancel or reschedule your appointment.   If you are experiencing a Mental Health or Behavioral Health Crisis or need someone to talk to, please call 1-800-273-TALK (toll free, 24 hour hotline)  Patient verbalizes understanding of instructions and care plan provided today and agrees to view in MyChart. Active MyChart status and patient understanding of how to access instructions and care plan via MyChart confirmed with patient.     Juanell Fairly RN, BSN, [facility] [place]  [facility], [facility]  Care Coordinator Phone: [phone]

## 2023-04-09 NOTE — Telephone Encounter (Signed)
 The pharmacy is requesting this rx to be resent under a different provider. Unable to submit the rx under Dr.Caraballo. Please address.

## 2023-04-13 NOTE — Progress Notes (Signed)
 Internal Medicine Clinic Attending  Case discussed with the resident at the time of the visit.  We reviewed the resident's history and exam and pertinent patient test results.  I agree with the assessment, diagnosis, and plan of care documented in the resident's note.

## 2023-04-22 ENCOUNTER — Ambulatory Visit: Payer: Self-pay

## 2023-04-22 NOTE — Telephone Encounter (Signed)
 Called pt who stated she woke up yesterday am and notice the bruise (penny size) at Ozempic injection site. No pain. Has not increased in size since yesterday.

## 2023-04-22 NOTE — Telephone Encounter (Signed)
 Chief Complaint: bruising at injection site Symptoms: bruising Frequency: x 2 days Pertinent Negatives: Patient denies pain or swelling Disposition: [] ED /[] Urgent Care (no appt availability in office) / [] Appointment(In office/virtual)/ []  [facility]/ [x] Home Care/ [] Refused Recommended Disposition /[] [facility]/ [x]  Follow-up with PCP Additional Notes: Patient states that she took her Ozempic injection on Thursday and a couple days later she noticed a penny size bruise. Denies rash or pain or swelling. Patient states that she does rotate her sites and has never had this bruise before. States bruise is size of a penny.     Copied From CRM [id number]. Reason for Triage: Patient called in stating she was concerned she took her Ozempic injection and the injection site is showing a bruise which has never happened before. She would like to speak with a nurse to see if that's normal. Please call number listed below. [phone]   Reason for Disposition  [1] Caller has NON-URGENT medicine question about med that PCP prescribed AND [2] triager unable to answer question  Answer Assessment - Initial Assessment Questions 1. NAME of MEDICINE: "What medicine(s) are you calling about?"     ozempic 2. QUESTION: "What is your question?" (e.g., double dose of medicine, side effect)     Side effect-penny size bruise 3. PRESCRIBER: "Who prescribed the medicine?" Reason: if prescribed by specialist, call should be referred to that group.      Youssefzadeh,  4. SYMPTOMS: "Do you have any symptoms?" If Yes, ask: "What symptoms are you having?"  "How bad are the symptoms (e.g., mild, moderate, severe)     Mild, bruise at injection site  Protocols used: Medication Question Call-A-AH

## 2023-04-24 ENCOUNTER — Ambulatory Visit: Payer: Medicare HMO

## 2023-04-24 VITALS — Ht 63.0 in | Wt 215.0 lb

## 2023-04-24 DIAGNOSIS — Z0000 Encounter for general adult medical examination without abnormal findings: Secondary | ICD-10-CM

## 2023-04-24 NOTE — Patient Instructions (Signed)
 Carrie Franco , Thank you for taking time to come for your Medicare Wellness Visit. I appreciate your ongoing commitment to your health goals. Please review the following plan we discussed and let me know if I can assist you in the future.   Referrals/Orders/Follow-Ups/Clinician Recommendations: Keep maintaining your health by keeping your appointments with Dr. [name] and any specialists that you may see.  Call us if you need anything.  Have a great year!!!!  This is a list of the screening recommended for you and due dates:  Health Maintenance  Topic Date Due   Pneumococcal Vaccination (1 of 2 - PCV) Never done   Complete foot exam   Never done   Yearly kidney health urinalysis for diabetes  08/19/2010   Zoster (Shingles) Vaccine (1 of 2) Never done   COVID-19 Vaccine (4 - 2024-25 season) 09/16/2022   Colon Cancer Screening  05/13/2023   Hemoglobin A1C  07/05/2023   Flu Shot  08/16/2023   Yearly kidney function blood test for diabetes  09/19/2023   Eye exam for diabetics  11/06/2023   Medicare Annual Wellness Visit  04/23/2024   Mammogram  07/17/2024   Pap with HPV screening  01/04/2028   DTaP/Tdap/Td vaccine (4 - Td or Tdap) 05/02/2031   Hepatitis C Screening  Completed   HIV Screening  Completed   HPV Vaccine  Aged Out    Advanced directives: (Provided) Advance directive discussed with you today. I have provided a copy for you to complete at home and have notarized. Once this is complete, please bring a copy in to our office so we can scan it into your chart.   Next Medicare Annual Wellness Visit scheduled for next year: Yes
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
## 2023-04-29 NOTE — Progress Notes (Signed)
 Pt not seen.

## 2023-04-30 ENCOUNTER — Other Ambulatory Visit: Payer: Self-pay | Admitting: Student

## 2023-04-30 DIAGNOSIS — F331 Major depressive disorder, recurrent, moderate: Secondary | ICD-10-CM

## 2023-04-30 DIAGNOSIS — I10 Essential (primary) hypertension: Secondary | ICD-10-CM

## 2023-05-01 NOTE — Telephone Encounter (Signed)
 Recent Dispenses Fluoxetine 40mg  caps    01/19/2023 40 MG CAPS (disp 90, 90d supply)  10/19/2022 40 MG CAPS (disp 90, 90d supply)  07/13/2022 40 MG CAPS (disp 90, 90d supply)

## 2023-05-08 ENCOUNTER — Ambulatory Visit: Payer: Self-pay

## 2023-05-08 NOTE — Telephone Encounter (Signed)
 Pt has a appt Friday 4/25 with Dr Hartwell Linea T.
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
## 2023-05-10 ENCOUNTER — Ambulatory Visit: Payer: Self-pay | Admitting: Student

## 2023-05-10 ENCOUNTER — Telehealth: Payer: Self-pay | Admitting: Orthopedic Surgery

## 2023-05-10 ENCOUNTER — Encounter: Payer: Self-pay | Admitting: Registered Nurse

## 2023-05-10 ENCOUNTER — Encounter: Payer: Medicare HMO | Attending: Physical Medicine and Rehabilitation | Admitting: Registered Nurse

## 2023-05-10 VITALS — BP 117/74 | HR 80 | Ht 63.0 in | Wt 220.0 lb

## 2023-05-10 DIAGNOSIS — Z79891 Long term (current) use of opiate analgesic: Secondary | ICD-10-CM | POA: Insufficient documentation

## 2023-05-10 DIAGNOSIS — M2550 Pain in unspecified joint: Secondary | ICD-10-CM | POA: Insufficient documentation

## 2023-05-10 DIAGNOSIS — M47816 Spondylosis without myelopathy or radiculopathy, lumbar region: Secondary | ICD-10-CM | POA: Diagnosis not present

## 2023-05-10 DIAGNOSIS — Z5181 Encounter for therapeutic drug level monitoring: Secondary | ICD-10-CM | POA: Insufficient documentation

## 2023-05-10 DIAGNOSIS — G894 Chronic pain syndrome: Secondary | ICD-10-CM | POA: Diagnosis not present

## 2023-05-10 DIAGNOSIS — M1711 Unilateral primary osteoarthritis, right knee: Secondary | ICD-10-CM | POA: Insufficient documentation

## 2023-05-10 MED ORDER — OXYCODONE-ACETAMINOPHEN 5-325 MG PO TABS: 1.0000 | ORAL_TABLET | Freq: Four times a day (QID) | ORAL | 0 refills | Status: DC | PRN
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
## 2023-05-10 NOTE — Progress Notes (Deleted)
   Acute Office Visit  Subjective:     Patient ID: Carrie Franco, female    DOB: 1965/05/01, 58 y.o.   MRN: [id number]  No chief complaint on file.   HPI This is a 58 year old female with past medical history of hypertension, diabetes, obesity who presents today as an acute visit for headaches.  ROS    As per assessment and plan Objective:    LMP 11/22/2012  BP Readings from Last 3 Encounters:  04/04/23 138/82  03/14/23 136/79  03/14/23 130/78   Wt Readings from Last 3 Encounters:  04/24/23 215 lb (97.5 kg)  04/04/23 226 lb 3.2 oz (102.6 kg)  03/14/23 229 lb (103.9 kg)   SpO2 Readings from Last 3 Encounters:  04/04/23 96%  03/14/23 99%  02/21/23 97%      Physical Exam  No results found for any visits on 05/10/23.      Assessment & Plan:  Headaches  She has a history of trigeminal neuralgia of the right side of the face, which was previously treated with carbamazepine , back in 2017.   Problem List Items Addressed This Visit   None   No orders of the defined types were placed in this encounter.   No follow-ups on file.  Lanney Pitts, DO

## 2023-05-10 NOTE — Telephone Encounter (Signed)
 Patient called. She has a dental procedure 05/14/23. Would like an antibiotic called in if needed. Her cb# [phone]

## 2023-05-12 ENCOUNTER — Other Ambulatory Visit: Payer: Self-pay | Admitting: Surgical

## 2023-05-12 MED ORDER — CEPHALEXIN 500 MG PO CAPS: 2000.0000 mg | ORAL_CAPSULE | Freq: Once | ORAL | 0 refills | Status: AC

## 2023-05-12 NOTE — Telephone Encounter (Signed)
 Sent in keflex.   Tolerated ancef  at last surgery

## 2023-05-13 NOTE — Telephone Encounter (Signed)
 Called and advised pt.

## 2023-05-17 ENCOUNTER — Other Ambulatory Visit: Payer: Self-pay | Admitting: Student

## 2023-05-17 DIAGNOSIS — J309 Allergic rhinitis, unspecified: Secondary | ICD-10-CM

## 2023-05-20 NOTE — Telephone Encounter (Signed)
 Medication sent to pharmacy

## 2023-05-21 ENCOUNTER — Telehealth: Payer: Self-pay | Admitting: *Deleted

## 2023-05-21 NOTE — Telephone Encounter (Signed)
 Copied from CRM [id number]. Topic: Clinical - Medication Question >> May 21, 2023 10:37 AM Corin V wrote: Reason for CRM: Patient is on ozempic  and wants to know if she can be on multiple diet medications at the same time. Please call back to discuss at [phone]

## 2023-05-22 ENCOUNTER — Encounter: Payer: Self-pay | Admitting: Internal Medicine

## 2023-05-22 ENCOUNTER — Other Ambulatory Visit (HOSPITAL_COMMUNITY)
Admission: RE | Admit: 2023-05-22 | Discharge: 2023-05-22 | Disposition: A | Discharge status: Home / Self Care | Source: Ambulatory Visit | Attending: Internal Medicine | Admitting: Internal Medicine

## 2023-05-22 ENCOUNTER — Ambulatory Visit (INDEPENDENT_AMBULATORY_CARE_PROVIDER_SITE_OTHER): Payer: Self-pay | Admitting: Internal Medicine

## 2023-05-22 VITALS — BP 117/73 | HR 88 | Temp 98.6°F | Ht 63.0 in | Wt 219.1 lb

## 2023-05-22 DIAGNOSIS — Z0000 Encounter for general adult medical examination without abnormal findings: Secondary | ICD-10-CM

## 2023-05-22 DIAGNOSIS — I700 Atherosclerosis of aorta: Secondary | ICD-10-CM | POA: Diagnosis not present

## 2023-05-22 DIAGNOSIS — E1169 Type 2 diabetes mellitus with other specified complication: Secondary | ICD-10-CM | POA: Diagnosis not present

## 2023-05-22 DIAGNOSIS — J439 Emphysema, unspecified: Secondary | ICD-10-CM

## 2023-05-22 DIAGNOSIS — N898 Other specified noninflammatory disorders of vagina: Secondary | ICD-10-CM | POA: Diagnosis not present

## 2023-05-22 DIAGNOSIS — E785 Hyperlipidemia, unspecified: Secondary | ICD-10-CM | POA: Diagnosis not present

## 2023-05-22 DIAGNOSIS — E119 Type 2 diabetes mellitus without complications: Secondary | ICD-10-CM | POA: Diagnosis not present

## 2023-05-22 DIAGNOSIS — Z7985 Long-term (current) use of injectable non-insulin antidiabetic drugs: Secondary | ICD-10-CM

## 2023-05-22 DIAGNOSIS — E66813 Obesity, class 3: Secondary | ICD-10-CM

## 2023-05-22 DIAGNOSIS — Z6841 Body Mass Index (BMI) 40.0 and over, adult: Secondary | ICD-10-CM

## 2023-05-22 DIAGNOSIS — L309 Dermatitis, unspecified: Secondary | ICD-10-CM | POA: Diagnosis not present

## 2023-05-22 DIAGNOSIS — J432 Centrilobular emphysema: Secondary | ICD-10-CM

## 2023-05-22 DIAGNOSIS — I10 Essential (primary) hypertension: Secondary | ICD-10-CM | POA: Diagnosis not present

## 2023-05-22 MED ORDER — SEMAGLUTIDE (1 MG/DOSE) 4 MG/3ML ~~LOC~~ SOPN: 1.0000 mg | PEN_INJECTOR | SUBCUTANEOUS | 2 refills | Status: DC

## 2023-05-22 NOTE — Assessment & Plan Note (Signed)
 On Ozempic  0.5 mg weekly. Will increase to 1 mg weekly. Pt is tolerating it well. A1c repeated this visit.

## 2023-05-22 NOTE — Telephone Encounter (Signed)
 Pt saw Dr Meredeth Stallion this afternoon.

## 2023-05-22 NOTE — Assessment & Plan Note (Signed)
 Pt's reports good respiratory status with current inhalers. Will continue Advair and albuterol . Pt has PFTs ordered but not completed.

## 2023-05-22 NOTE — Assessment & Plan Note (Signed)
 Well controlled on current regimen of amlodipine  10 mg every day, Olmesartan  40 mg and hydrochlorothiazide  12.5 mg every day. Will continue current regimen. Will check renal fxn this visit.

## 2023-05-22 NOTE — Assessment & Plan Note (Signed)
 Pt reports she only took 3 days of her medication prior to losing them. She is still having vaginal discharge and odor but improved. Will repeat testing and restart treatment. Advised to complete her treatment this time. She does not qualify for recurrent BV infection so will treat with Metronidazole .

## 2023-05-22 NOTE — Assessment & Plan Note (Signed)
 Well controlled on prn Kenalog  ointment. Advised to avoid triggers and use ointment as needed. Skin without any signs of eczema.

## 2023-05-22 NOTE — Patient Instructions (Addendum)
 [name], it was a pleasure seeing you today! You endorsed feeling well today. Below are some of the things we talked about this visit. We look forward to seeing you in the follow up appointment!  Today we discussed: Continue taking your medications.   We will increase your ozempic  to 1 mg weekly. Let us  know if you have any side effects.   I will order some lab work and call you tomorrow.   I have ordered the following labs today:   Lab Orders         Microalbumin / Creatinine Urine Ratio         Lipid Profile         Hemoglobin A1c         BMP8+Anion Gap       Referrals ordered today:   Referral Orders  No referral(s) requested today     I have ordered the following medication/changed the following medications:   Stop the following medications: Medications Discontinued During This Encounter  Medication Reason   Semaglutide ,0.25 or 0.5MG /DOS, (OZEMPIC , 0.25 OR 0.5 MG/DOSE,) 2 MG/1.5ML SOPN Dose change   valACYclovir  (VALTREX ) 500 MG tablet Patient has not taken in last 30 days     Start the following medications: Meds ordered this encounter  Medications   Semaglutide , 1 MG/DOSE, 4 MG/3ML SOPN    Sig: Inject 1 mg into the skin once a week.    Dispense:  3 mL    Refill:  2     Follow-up: 3 months follow up  Please make sure to arrive 15 minutes prior to your next appointment. If you arrive late, you may be asked to reschedule.   We look forward to seeing you next time. Please call our clinic at [phone] if you have any questions or concerns. The best time to call is Monday-Friday from 9am-4pm, but there is someone available 24/7. If after hours or the weekend, call the main hospital number and ask for the Internal Medicine Resident On-Call. If you need medication refills, please notify your pharmacy one week in advance and they will send us  a request.  Thank you for letting us  take part in your care. Wishing you the best!  Thank you, Jackolyn Masker, MD

## 2023-05-22 NOTE — Assessment & Plan Note (Signed)
 Microalbumin: creatinine ratio done this visit Foot exam done this visit.  A1c done this visit.
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
## 2023-05-22 NOTE — Assessment & Plan Note (Signed)
 Pt is taking her bempedoic acid . Dispense hx suggest some non-adherence but pt reports good adherence. Will get repeat lipid panel this visit.

## 2023-05-22 NOTE — Addendum Note (Signed)
 Addended by: Manfred Seed on: 05/22/2023 02:00 PM   Modules accepted: Orders

## 2023-05-22 NOTE — Assessment & Plan Note (Signed)
 Will increase pt's ozempic  to 1 mg weekly. She has lost around 10lbs with current dose and expect further improvement with dose increase. Pt is tolerating it well.

## 2023-05-23 ENCOUNTER — Other Ambulatory Visit: Payer: Self-pay | Admitting: Student

## 2023-05-23 LAB — CERVICOVAGINAL ANCILLARY ONLY
Bacterial Vaginitis (gardnerella): POSITIVE — AB
Candida Glabrata: POSITIVE — AB
Candida Vaginitis: POSITIVE — AB
Chlamydia: NEGATIVE
Comment: NEGATIVE
Comment: NEGATIVE
Comment: NEGATIVE
Comment: NEGATIVE
Comment: NEGATIVE
Comment: NORMAL
Neisseria Gonorrhea: NEGATIVE
Trichomonas: NEGATIVE

## 2023-05-23 LAB — MICROALBUMIN / CREATININE URINE RATIO
Creatinine, Urine: 241.2 mg/dL
Microalb/Creat Ratio: 6 mg/g{creat} (ref 0–29)
Microalbumin, Urine: 14.5 ug/mL

## 2023-05-23 LAB — BMP8+ANION GAP
Anion Gap: 18 mmol/L (ref 10.0–18.0)
BUN/Creatinine Ratio: 19 (ref 9–23)
BUN: 14 mg/dL (ref 6–24)
CO2: 17 mmol/L — ABNORMAL LOW (ref 20–29)
Calcium: 10.1 mg/dL (ref 8.7–10.2)
Chloride: 107 mmol/L — ABNORMAL HIGH (ref 96–106)
Creatinine, Ser: 0.74 mg/dL (ref 0.57–1.00)
Glucose: 114 mg/dL — ABNORMAL HIGH (ref 70–99)
Potassium: 4.2 mmol/L (ref 3.5–5.2)
Sodium: 142 mmol/L (ref 134–144)
eGFR: 94 mL/min/{1.73_m2} (ref >59)

## 2023-05-23 LAB — LIPID PANEL
Chol/HDL Ratio: 3.2 ratio (ref 0.0–4.4)
Cholesterol, Total: 149 mg/dL (ref 100–199)
HDL: 47 mg/dL (ref >39)
LDL Chol Calc (NIH): 83 mg/dL (ref 0–99)
Triglycerides: 104 mg/dL (ref 0–149)
VLDL Cholesterol Cal: 19 mg/dL (ref 5–40)

## 2023-05-23 LAB — HEMOGLOBIN A1C
Est. average glucose Bld gHb Est-mCnc: 117 mg/dL
Hgb A1c MFr Bld: 5.7 % — ABNORMAL HIGH (ref 4.8–5.6)

## 2023-05-23 MED ORDER — CYCLOBENZAPRINE HCL 10 MG PO TABS: 10.0000 mg | ORAL_TABLET | Freq: Three times a day (TID) | ORAL | 0 refills | Status: DC | PRN

## 2023-05-23 MED ORDER — METRONIDAZOLE 500 MG PO TABS: 500.0000 mg | ORAL_TABLET | Freq: Two times a day (BID) | ORAL | 0 refills | Status: AC

## 2023-05-23 MED ORDER — FLUCONAZOLE 150 MG PO TABS: 150.0000 mg | ORAL_TABLET | Freq: Every day | ORAL | 0 refills | Status: AC

## 2023-05-23 NOTE — Addendum Note (Signed)
 Addended by: Jackolyn Masker on: 05/23/2023 03:06 PM   Modules accepted: Orders

## 2023-05-26 ENCOUNTER — Encounter (HOSPITAL_COMMUNITY): Payer: Self-pay | Admitting: Emergency Medicine

## 2023-05-26 ENCOUNTER — Ambulatory Visit (HOSPITAL_COMMUNITY)
Admission: EM | Admit: 2023-05-26 | Discharge: 2023-05-26 | Disposition: A | Discharge status: Home / Self Care | Attending: Internal Medicine | Admitting: Internal Medicine

## 2023-05-26 DIAGNOSIS — K529 Noninfective gastroenteritis and colitis, unspecified: Secondary | ICD-10-CM

## 2023-05-26 DIAGNOSIS — R197 Diarrhea, unspecified: Secondary | ICD-10-CM | POA: Diagnosis not present

## 2023-05-26 DIAGNOSIS — R110 Nausea: Secondary | ICD-10-CM | POA: Diagnosis not present

## 2023-05-26 MED ORDER — ONDANSETRON 4 MG PO TBDP
ORAL_TABLET | ORAL | Status: AC
  Filled 2023-05-26: qty 1

## 2023-05-26 MED ORDER — ONDANSETRON 4 MG PO TBDP
4.0000 mg | ORAL_TABLET | Freq: Once | ORAL | Status: AC
  Administered 2023-05-26: 4 mg via ORAL

## 2023-05-26 MED ORDER — ONDANSETRON 4 MG PO TBDP: 4.0000 mg | ORAL_TABLET | Freq: Three times a day (TID) | ORAL | 0 refills | Status: AC | PRN

## 2023-05-26 MED ORDER — DIPHENOXYLATE-ATROPINE 2.5-0.025 MG PO TABS: 1.0000 | ORAL_TABLET | Freq: Four times a day (QID) | ORAL | 0 refills | Status: AC | PRN
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
## 2023-05-26 NOTE — Discharge Instructions (Addendum)
 Diarrhea with nausea that has been going on for a week now. Food borne illness seems less likely given the duration. Given that other people around her have been sick, this could be a viral gastroenteritis.  Since you are able to eat and drink and are able to stay hydrated we will attempt to treat this conservatively.  However if the symptoms persist then would consider another source for the nausea and diarrhea and would recommend following up with your PCP Lomotil 1 tablet 4 times daily as needed for liquid diarrhea. Once you are having more regular and form stool, do not take this medication. Zofran  4 mg orally disintegrating tablet every 8 hours as needed for nausea. First dose at 11:00 am. Next dose can be taken around 7-8:00 pm tonight If the diarrhea persist past Tuesday/Wednesday, then recommend contacting your primary care provider to discuss the symptoms and see if this could be a side effect of any of your medications or if there is another source for the dirrhea. If the symptoms worsen, you develop fevers, you are unable to keep fluids down, or abdominal pain worsens significantly then recommend going to the emergency room Continue to stay hydrated and drink plenty of fluids.

## 2023-05-26 NOTE — ED Triage Notes (Addendum)
 Pt reports had diarrhea for a week after eating Diamantina Form. Reports some nausea. Reports when took Pepto Bismol would help symptoms but when wore off, would come back.

## 2023-05-27 NOTE — Progress Notes (Signed)
 Internal Medicine Clinic Attending  Case discussed with the resident at the time of the visit.  We reviewed the resident's history and exam and pertinent patient test results.  I agree with the assessment, diagnosis, and plan of care documented in the resident's note.

## 2023-05-30 ENCOUNTER — Other Ambulatory Visit: Payer: Self-pay | Admitting: Student

## 2023-06-03 NOTE — Telephone Encounter (Signed)
 Medication sent to pharmacy

## 2023-06-04 ENCOUNTER — Telehealth: Payer: Self-pay | Admitting: *Deleted

## 2023-06-04 ENCOUNTER — Ambulatory Visit: Payer: Self-pay

## 2023-06-04 NOTE — Telephone Encounter (Signed)
 Call from patients states is on Ozempic .  Now has Diarrhea again. Went to Urgent Care was given something fore the Diarrhea.  Restarted the Ozempic .  Requesting something be sent to the [facility] on [address] for her Diarrhea.  Stated she plans to stop the Ozempic .

## 2023-06-04 NOTE — Telephone Encounter (Signed)
 Chief Complaint: diarrhea Symptoms: diarrhea, nausea Frequency: 2 wks Pertinent Negatives: Patient denies bleeding, fever, abd pain, dizziness, weakness, dark urine, urinating less Disposition: [] ED /[] Urgent Care (no appt availability in office) / [x] Appointment(In office/virtual)/ []  [place] [facility]/ [] Home Care/ [] Refused Recommended Disposition /[] [facility] [facility]/ []  Follow-up with PCP Additional Notes: Pt reports nausea and diarrhea for 2 wks. Pt states sometimes she vomits "but it doesn't come all the way up." Pt was seen at [facility] @ [facility] and was prescribed Zofran  and Lomotil  which helped until pt took her Ozempic  shot last Friday and now her symptoms have returned. UC advised the pt she should follow up with her PCP and that her symptoms may be a side effect of Ozempic . Pt reports "a lot" of diarrhea and states it is "straight water ." Denies abd pain. Is able to eat and drink normally. Denies dizziness, weakness, dark urine, urinating less. Endorses dry mouth but states her mouth is always dry, that is not new. RN tried to schedule the pt in the office but was unable to pull up any appointment slots and was receiving error messages. RN called the CAL and transferred the pt to [facility].    Copied from CRM [id number]. Topic: Clinical - Red Word Triage >> Jun 04, 2023  2:30 PM Lenon Radar A wrote: Red Word that prompted transfer to Nurse Triage: Diarrhea +2 weeks, Nausea Reason for Disposition  [1] SEVERE diarrhea (e.g., 7 or more times / day more than normal) AND [2] present > 24 hours (1 day)  Answer Assessment - Initial Assessment Questions 1. DIARRHEA SEVERITY: "How bad is the diarrhea?" "How many more stools have you had in the past 24 hours than normal?"    - NO DIARRHEA (SCALE 0)   - MILD (SCALE 1-3): Few loose or mushy BMs; increase of 1-3 stools over normal daily number of stools; mild increase in ostomy output.   -  MODERATE (SCALE 4-7): Increase of 4-6 stools daily  over normal; moderate increase in ostomy output.   -  SEVERE (SCALE 8-10; OR "WORST POSSIBLE"): Increase of 7 or more stools daily over normal; moderate increase in ostomy output; incontinence.     "A lot"  2. ONSET: "When did the diarrhea begin?"      2 wks  3. BM CONSISTENCY: "How loose or watery is the diarrhea?"      "Straight water " 4. VOMITING: "Are you also vomiting?" If Yes, ask: "How many times in the past 24 hours?"      "Doesn't come all the way up" 5. ABDOMEN PAIN: "Are you having any abdomen pain?" If Yes, ask: "What does it feel like?" (e.g., crampy, dull, intermittent, constant)      No  6. ABDOMEN PAIN SEVERITY: If present, ask: "How bad is the pain?"  (e.g., Scale 1-10; mild, moderate, or severe)   - MILD (1-3): doesn't interfere with normal activities, abdomen soft and not tender to touch    - MODERATE (4-7): interferes with normal activities or awakens from sleep, abdomen tender to touch    - SEVERE (8-10): excruciating pain, doubled over, unable to do any normal activities       No  7. ORAL INTAKE: If vomiting, "Have you been able to drink liquids?" "How much liquids have you had in the past 24 hours?"     Able to eat and drink normally "but it comes back up" 8. HYDRATION: "Any signs of dehydration?" (e.g., dry mouth [not just dry lips], too  weak to stand, dizziness, new weight loss) "When did you last urinate?"     Denies  9. EXPOSURE: "Have you traveled to a foreign country recently?" "Have you been exposed to anyone with diarrhea?" "Could you have eaten any food that was spoiled?"     No  10. ANTIBIOTIC USE: "Are you taking antibiotics now or have you taken antibiotics in the past 2 months?"       Antibiotics since Monday, dental implants were removed; diarrhea started before then 11. OTHER SYMPTOMS: "Do you have any other symptoms?" (e.g., fever, blood in stool)      Went to [facility] UC, prescribed Lomotil  and Zofran  which helped. Took Ozempic  Friday and diarrhea  got worse again. She sometimes vomits and it comes up "but not all the way." Denies abdominal pain. Denies bloody stool.  Denies fever.  Protocols used: [facility]

## 2023-06-05 ENCOUNTER — Other Ambulatory Visit: Payer: Self-pay | Admitting: Internal Medicine

## 2023-06-05 ENCOUNTER — Other Ambulatory Visit: Payer: Self-pay | Admitting: Student

## 2023-06-05 DIAGNOSIS — E785 Hyperlipidemia, unspecified: Secondary | ICD-10-CM

## 2023-06-05 DIAGNOSIS — G720 Drug-induced myopathy: Secondary | ICD-10-CM

## 2023-06-05 NOTE — Telephone Encounter (Signed)
 Medication sent to pharmacy

## 2023-06-18 ENCOUNTER — Other Ambulatory Visit: Payer: Self-pay | Admitting: Student

## 2023-06-18 DIAGNOSIS — K219 Gastro-esophageal reflux disease without esophagitis: Secondary | ICD-10-CM

## 2023-06-18 NOTE — Telephone Encounter (Unsigned)
 Copied from CRM [id number]. Topic: Clinical - Medication Refill >> Jun 18, 2023  3:30 PM Madelyne Schiff wrote: Charlcie Conger from [employer] calling:  sent to Dr Fabiola Holy and it was rejected.   Medication: pregabalin  (LYRICA ) 100 MG capsule   Has the patient contacted their pharmacy? Yes (Agent: If no, request that the patient contact the pharmacy for the refill. If patient does not wish to contact the pharmacy document the reason why and proceed with request.) (Agent: If yes, when and what did the pharmacy advise?)  This is the patient's preferred pharmacy:  [facility] [address] [place] - [address] [place] [address] Phone: [phone] Fax: [phone]   Is this the correct pharmacy for this prescription? Yes If no, delete pharmacy and type the correct one.    H

## 2023-06-20 ENCOUNTER — Other Ambulatory Visit: Payer: Self-pay

## 2023-06-20 ENCOUNTER — Telehealth: Payer: Self-pay | Admitting: *Deleted

## 2023-06-20 MED ORDER — PREGABALIN 100 MG PO CAPS: 100.0000 mg | ORAL_CAPSULE | Freq: Two times a day (BID) | ORAL | 0 refills | Status: DC

## 2023-06-20 NOTE — Telephone Encounter (Signed)
 Received a call from [facility], [employer] - stated drug interaction between Lyrica  and Oxycodone , causes sedation. They need ok to fill meds to override for insurance purposes. Sending to the doctor for the ok to fill meds.

## 2023-06-21 NOTE — Telephone Encounter (Signed)
 Return call to [employer]- ok given to Victor,pharmacist, to fill Oxycodone  and Lyrica  per Dr Jari Merles.

## 2023-06-26 ENCOUNTER — Telehealth: Payer: Self-pay

## 2023-06-27 ENCOUNTER — Other Ambulatory Visit: Payer: Self-pay | Admitting: Student

## 2023-06-27 DIAGNOSIS — I10 Essential (primary) hypertension: Secondary | ICD-10-CM

## 2023-06-27 NOTE — Telephone Encounter (Signed)
 Medication sent to pharmacy

## 2023-06-30 ENCOUNTER — Other Ambulatory Visit: Payer: Self-pay | Admitting: Student

## 2023-06-30 DIAGNOSIS — E785 Hyperlipidemia, unspecified: Secondary | ICD-10-CM

## 2023-06-30 DIAGNOSIS — G720 Drug-induced myopathy: Secondary | ICD-10-CM

## 2023-07-01 NOTE — Telephone Encounter (Signed)
 Medication sent to pharmacy

## 2023-07-08 ENCOUNTER — Other Ambulatory Visit: Payer: Self-pay | Admitting: Student

## 2023-07-08 DIAGNOSIS — I10 Essential (primary) hypertension: Secondary | ICD-10-CM

## 2023-07-08 DIAGNOSIS — K219 Gastro-esophageal reflux disease without esophagitis: Secondary | ICD-10-CM

## 2023-07-10 ENCOUNTER — Encounter: Admitting: Registered Nurse

## 2023-07-12 ENCOUNTER — Other Ambulatory Visit: Payer: Self-pay | Admitting: Student

## 2023-07-12 ENCOUNTER — Encounter: Attending: Physical Medicine and Rehabilitation | Admitting: Registered Nurse

## 2023-07-12 ENCOUNTER — Other Ambulatory Visit: Payer: Self-pay | Admitting: Internal Medicine

## 2023-07-12 ENCOUNTER — Encounter: Payer: Self-pay | Admitting: Registered Nurse

## 2023-07-12 VITALS — BP 124/79 | HR 80 | Ht 63.0 in | Wt 211.0 lb

## 2023-07-12 DIAGNOSIS — M170 Bilateral primary osteoarthritis of knee: Secondary | ICD-10-CM | POA: Insufficient documentation

## 2023-07-12 DIAGNOSIS — M25511 Pain in right shoulder: Secondary | ICD-10-CM | POA: Insufficient documentation

## 2023-07-12 DIAGNOSIS — Z5181 Encounter for therapeutic drug level monitoring: Secondary | ICD-10-CM | POA: Insufficient documentation

## 2023-07-12 DIAGNOSIS — G629 Polyneuropathy, unspecified: Secondary | ICD-10-CM | POA: Diagnosis not present

## 2023-07-12 DIAGNOSIS — Z1231 Encounter for screening mammogram for malignant neoplasm of breast: Secondary | ICD-10-CM

## 2023-07-12 DIAGNOSIS — G894 Chronic pain syndrome: Secondary | ICD-10-CM | POA: Diagnosis not present

## 2023-07-12 DIAGNOSIS — M47816 Spondylosis without myelopathy or radiculopathy, lumbar region: Secondary | ICD-10-CM | POA: Insufficient documentation

## 2023-07-12 DIAGNOSIS — Z79891 Long term (current) use of opiate analgesic: Secondary | ICD-10-CM | POA: Diagnosis not present

## 2023-07-12 MED ORDER — OXYCODONE-ACETAMINOPHEN 5-325 MG PO TABS: 1.0000 | ORAL_TABLET | Freq: Four times a day (QID) | ORAL | 0 refills | Status: DC | PRN
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
## 2023-07-15 ENCOUNTER — Other Ambulatory Visit: Payer: Self-pay | Admitting: Student

## 2023-07-15 DIAGNOSIS — I10 Essential (primary) hypertension: Secondary | ICD-10-CM

## 2023-07-15 NOTE — Telephone Encounter (Signed)
 Copied from CRM [id number]. Topic: Clinical - Medication Refill >> Jul 15, 2023 11:02 AM Suzette B wrote: Medication: olmesartan  (BENICAR ) 40 MG tablet  Has the patient contacted their pharmacy? No Patient states the pharmacy called her to advise her that the refill was denied   This is the patient's preferred pharmacy:  [facility] [address] [place] - [address] [place] [place] [address] Phone: [phone] Fax: [phone]    Is this the correct pharmacy for this prescription? Yes If no, delete pharmacy and type the correct one.   Has the prescription been filled recently? Yes  Is the patient out of the medication? Yes been out for about 5 days   Has the patient been seen for an appointment in the last year OR does the patient have an upcoming appointment? Yes  Can we respond through MyChart? Yes  Agent: Please be advised that Rx refills may take up to 3 business days. We ask that you follow-up with your pharmacy.

## 2023-07-15 NOTE — Telephone Encounter (Signed)
 Medication last refilled 06/27/23.

## 2023-07-16 LAB — DRUG TOX MONITOR 1 W/CONF, ORAL FLD
Amphetamines: NEGATIVE ng/mL (ref <10)
Barbiturates: NEGATIVE ng/mL (ref <10)
Benzodiazepines: NEGATIVE ng/mL (ref <0.50)
Buprenorphine: NEGATIVE ng/mL (ref <0.10)
Cocaine: NEGATIVE ng/mL (ref <5.0)
Codeine: NEGATIVE ng/mL (ref <2.5)
Dihydrocodeine: NEGATIVE ng/mL (ref <2.5)
Fentanyl: NEGATIVE ng/mL (ref <0.10)
Heroin Metabolite: NEGATIVE ng/mL (ref <1.0)
Hydrocodone: NEGATIVE ng/mL (ref <2.5)
Hydromorphone: NEGATIVE ng/mL (ref <2.5)
MARIJUANA: NEGATIVE ng/mL (ref <2.5)
MDMA: NEGATIVE ng/mL (ref <10)
Meprobamate: NEGATIVE ng/mL (ref <2.5)
Methadone: NEGATIVE ng/mL (ref <5.0)
Morphine: NEGATIVE ng/mL (ref <2.5)
Nicotine Metabolite: NEGATIVE ng/mL (ref <5.0)
Norhydrocodone: NEGATIVE ng/mL (ref <2.5)
Opiates: POSITIVE ng/mL — AB (ref <2.5)
Oxycodone: 14 ng/mL — ABNORMAL HIGH (ref <2.5)
Oxymorphone: NEGATIVE ng/mL (ref <2.5)
Phencyclidine: NEGATIVE ng/mL (ref <10)
Tapentadol: NEGATIVE ng/mL (ref <5.0)
Tramadol: NEGATIVE ng/mL (ref <5.0)
Zolpidem: NEGATIVE ng/mL (ref <5.0)

## 2023-07-16 LAB — DRUG TOX ALC METAB W/CON, ORAL FLD: Alcohol Metabolite: NEGATIVE ng/mL (ref <25)

## 2023-07-17 ENCOUNTER — Telehealth: Payer: Self-pay | Admitting: *Deleted

## 2023-07-17 ENCOUNTER — Other Ambulatory Visit: Payer: Self-pay | Admitting: Student

## 2023-07-17 MED ORDER — PREGABALIN 100 MG PO CAPS: 100.0000 mg | ORAL_CAPSULE | Freq: Two times a day (BID) | ORAL | 0 refills | Status: DC

## 2023-07-17 NOTE — Telephone Encounter (Signed)
 Copied from CRM [id number]. Topic: Clinical - Prescription Issue >> Jul 16, 2023  5:38 PM Mercer PEDLAR wrote: Reason for CRM: Steffan calling from [name] regarding  pregabalin  (LYRICA ) 100 MG capsule stating that he is getting message from insurance company stating that prescriber Dr. Hadassah Melena does not have a valid DEA number. He is requesting for prescription to be resent by prescriber who has a valid DEA number.  Callback: [phone]

## 2023-07-24 ENCOUNTER — Ambulatory Visit

## 2023-07-25 ENCOUNTER — Other Ambulatory Visit: Payer: Self-pay | Admitting: Student

## 2023-07-25 DIAGNOSIS — F331 Major depressive disorder, recurrent, moderate: Secondary | ICD-10-CM

## 2023-07-26 ENCOUNTER — Other Ambulatory Visit: Payer: Self-pay

## 2023-07-26 ENCOUNTER — Telehealth

## 2023-07-27 NOTE — Patient Instructions (Signed)
 Visit Information  Thank you for taking time to visit with me today. Please don't hesitate to contact me if I can be of assistance to you before our next scheduled appointment.  Your next care management appointment is a Telephone follow up appointment date/time:  10/28/23 with [name] [facility]  Please call the care guide team at [phone] if you need to cancel, schedule, or reschedule an appointment.   Please call the [place]  National Suicide Prevention Lifeline: (336)388-1974 or TTY: 970-585-3719 TTY 910-713-1818) to talk to a trained counselor call 1-800-273-TALK (toll free, 24 hour hotline) call 911 if you are experiencing a Mental Health or Behavioral Health Crisis or need someone to talk to.  Wilbert Diver RN, BSN, [facility] [place]  [facility], [facility]    Care Coordinator Phone: [phone]
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
## 2023-07-30 ENCOUNTER — Ambulatory Visit

## 2023-08-05 ENCOUNTER — Ambulatory Visit: Admitting: Student

## 2023-08-05 ENCOUNTER — Encounter: Payer: Self-pay | Admitting: Student

## 2023-08-05 ENCOUNTER — Other Ambulatory Visit: Payer: Self-pay

## 2023-08-05 ENCOUNTER — Ambulatory Visit: Payer: Self-pay

## 2023-08-05 VITALS — BP 121/61 | HR 90 | Temp 98.2°F | Ht 63.0 in | Wt 217.6 lb

## 2023-08-05 DIAGNOSIS — F331 Major depressive disorder, recurrent, moderate: Secondary | ICD-10-CM

## 2023-08-05 DIAGNOSIS — M79672 Pain in left foot: Secondary | ICD-10-CM | POA: Diagnosis not present

## 2023-08-05 MED ORDER — FLUOXETINE HCL 40 MG PO CAPS: 40.0000 mg | ORAL_CAPSULE | Freq: Every day | ORAL | 3 refills | Status: DC

## 2023-08-05 MED ORDER — NAPROXEN 500 MG PO TABS: 500.0000 mg | ORAL_TABLET | Freq: Two times a day (BID) | ORAL | 0 refills | Status: DC

## 2023-08-05 NOTE — Assessment & Plan Note (Signed)
 Pt presents for an acute visit for foot pain. This started about a week and a half ago, with no exacerbating factors such as fall, dropping something on her foot, increase in her exercise, accidental inversion/eversion injuries, no new shoes either. She describes the pain as dull and in her mid-foot affecting both her dorsal and plantar aspect. No ankle pain.   On my exam, there is tenderness to palpation around the navicular/middle cuneiform region. She does still have full range of motion, and no specific motion exacerbates the pain. No rashes or erythema.   Unclear what's causing her pain, don't think this is plantar fascitis, no swelling to suggest an inflammatory process. Could be a stress fracture given acute onset. Thankfully it's not affecting her daily life, as she is able to walk just fine on it.   Plan:  - Left foot x-ray  - 5 day course of naproxen  500mg  BID for pain - Advised to rest foot as much as she can

## 2023-08-05 NOTE — Telephone Encounter (Signed)
 FYI Only or Action Required?: FYI only for provider.  Patient was last seen in primary care on 05/22/2023 by Fernand Prost, MD.  Called Nurse Triage reporting Foot Pain.  Symptoms began a week ago.  Interventions attempted: Rest, hydration, or home remedies.  Symptoms are: unchanged.  Triage Disposition: See PCP When Office is Open (Within 3 Days)-appointment made for today per patient request.  Patient/caregiver understands and will follow disposition?: Yes  Copied from CRM [id number]. Topic: Clinical - Red Word Triage >> Aug 05, 2023  9:45 AM Susanna ORN wrote: Red Word that prompted transfer to Nurse Triage: Patient states she is having a dull pain in her left foot that's not stopping. States it has been like this for about a week. She's wanting to have her A1C checked as she stated she's pre-diabetic. Reason for Disposition  [1] MODERATE pain (e.g., interferes with normal activities, limping) AND [2] present > 3 days  Answer Assessment - Initial Assessment Questions 1. ONSET: When did the pain start?      Started 1 weeks ago 2. LOCATION: Where is the pain located?      Left foot 3. PAIN: How bad is the pain?    (Scale 1-10; or mild, moderate, severe)     7-pain is located to both top and bottom where the pain is going into her toes.  4. WORK OR EXERCISE: Has there been any recent work or exercise that involved this part of the body?      no 5. CAUSE: What do you think is causing the foot pain?     unsure 6. OTHER SYMPTOMS: Do you have any other symptoms? (e.g., leg pain, rash, fever, numbness)     Reports tingling to foot  Protocols used: Foot Pain-A-AH
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
## 2023-08-05 NOTE — Patient Instructions (Signed)
 Thank you so much for coming to the clinic today!   I have put in for an xray of your foot  Please pick up the pain medications, and take only as needed for pain Please try and stay off of the foot, if your symptoms dont get better within the next two weeks please let us  know.   If you have any questions please feel free to the call the clinic at anytime at [phone]. It was a pleasure seeing you!  Best, Dr. Avyukth Bontempo

## 2023-08-07 ENCOUNTER — Ambulatory Visit

## 2023-08-07 ENCOUNTER — Inpatient Hospital Stay
Admission: RE | Admit: 2023-08-07 | Discharge: 2023-08-07 | Discharge status: Home / Self Care | Source: Ambulatory Visit | Attending: Internal Medicine | Admitting: Internal Medicine

## 2023-08-07 DIAGNOSIS — Z1231 Encounter for screening mammogram for malignant neoplasm of breast: Secondary | ICD-10-CM

## 2023-08-08 ENCOUNTER — Other Ambulatory Visit: Payer: Self-pay | Admitting: Internal Medicine

## 2023-08-08 DIAGNOSIS — E119 Type 2 diabetes mellitus without complications: Secondary | ICD-10-CM

## 2023-08-09 NOTE — Progress Notes (Signed)
 Internal Medicine Clinic Attending  Case discussed with the resident at the time of the visit.  We reviewed the resident's history and exam and pertinent patient test results.  I agree with the assessment, diagnosis, and plan of care documented in the resident's note.

## 2023-08-14 ENCOUNTER — Ambulatory Visit (HOSPITAL_COMMUNITY)
Admission: RE | Admit: 2023-08-14 | Discharge: 2023-08-14 | Disposition: A | Discharge status: Home / Self Care | Source: Ambulatory Visit | Attending: Internal Medicine | Admitting: Internal Medicine

## 2023-08-14 DIAGNOSIS — M79672 Pain in left foot: Secondary | ICD-10-CM | POA: Insufficient documentation

## 2023-08-14 DIAGNOSIS — M19072 Primary osteoarthritis, left ankle and foot: Secondary | ICD-10-CM | POA: Diagnosis not present

## 2023-08-14 DIAGNOSIS — M7732 Calcaneal spur, left foot: Secondary | ICD-10-CM | POA: Diagnosis not present

## 2023-08-15 ENCOUNTER — Other Ambulatory Visit: Payer: Self-pay | Admitting: Student

## 2023-08-15 DIAGNOSIS — J309 Allergic rhinitis, unspecified: Secondary | ICD-10-CM

## 2023-08-21 ENCOUNTER — Emergency Department (HOSPITAL_COMMUNITY)

## 2023-08-21 ENCOUNTER — Emergency Department (HOSPITAL_COMMUNITY)
Admission: EM | Admit: 2023-08-21 | Discharge: 2023-08-21 | Disposition: A | Discharge status: Home / Self Care | Attending: Emergency Medicine | Admitting: Emergency Medicine

## 2023-08-21 ENCOUNTER — Other Ambulatory Visit: Payer: Self-pay

## 2023-08-21 DIAGNOSIS — M542 Cervicalgia: Secondary | ICD-10-CM | POA: Diagnosis not present

## 2023-08-21 DIAGNOSIS — M25511 Pain in right shoulder: Secondary | ICD-10-CM | POA: Insufficient documentation

## 2023-08-21 DIAGNOSIS — Y92410 Unspecified street and highway as the place of occurrence of the external cause: Secondary | ICD-10-CM | POA: Insufficient documentation

## 2023-08-21 MED ORDER — OXYCODONE-ACETAMINOPHEN 5-325 MG PO TABS
1.0000 | ORAL_TABLET | Freq: Once | ORAL | Status: AC
  Administered 2023-08-21: 1 via ORAL
  Filled 2023-08-21: qty 1

## 2023-08-21 MED ORDER — DIAZEPAM 2 MG PO TABS: 2.0000 mg | ORAL_TABLET | Freq: Four times a day (QID) | ORAL | 0 refills | Status: AC | PRN

## 2023-08-21 MED ORDER — DIAZEPAM 5 MG PO TABS
5.0000 mg | ORAL_TABLET | Freq: Once | ORAL | Status: AC
  Administered 2023-08-21: 5 mg via ORAL
  Filled 2023-08-21: qty 1

## 2023-08-21 MED ORDER — OXYCODONE-ACETAMINOPHEN 5-325 MG PO TABS: 1.0000 | ORAL_TABLET | Freq: Four times a day (QID) | ORAL | 0 refills | Status: DC | PRN
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
## 2023-08-21 NOTE — Discharge Instructions (Addendum)
 You have a cortical irregularity of the head of the humerus on the right side.  This needs to be evaluated by a CAT scan or MRI of your shoulder.  Follow-up with your doctor or orthopedist for this
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
## 2023-08-22 ENCOUNTER — Other Ambulatory Visit: Payer: Self-pay | Admitting: Student

## 2023-08-22 DIAGNOSIS — I10 Essential (primary) hypertension: Secondary | ICD-10-CM

## 2023-08-26 ENCOUNTER — Other Ambulatory Visit: Payer: Self-pay | Admitting: Student

## 2023-08-26 NOTE — Telephone Encounter (Signed)
 LOV -08/05/23. Last Lyrica  refill was 07/17/23.

## 2023-09-02 ENCOUNTER — Telehealth: Payer: Self-pay | Admitting: *Deleted

## 2023-09-02 ENCOUNTER — Other Ambulatory Visit: Payer: Self-pay | Admitting: Student

## 2023-09-02 DIAGNOSIS — M25775 Osteophyte, left foot: Secondary | ICD-10-CM

## 2023-09-02 NOTE — Telephone Encounter (Signed)
 Will forward to PCP.  Copied from CRM [id number]. Topic: Clinical - Lab/Test Results >> Sep 02, 2023 10:20 AM Diannia H wrote: Reason for CRM: Patient called and is wanting to know the results of her feet imaging. It says complete but no results. Could you assist? Patients callback number is [phone].

## 2023-09-05 DIAGNOSIS — M25511 Pain in right shoulder: Secondary | ICD-10-CM | POA: Diagnosis not present

## 2023-09-08 ENCOUNTER — Other Ambulatory Visit: Payer: Self-pay | Admitting: Student

## 2023-09-08 DIAGNOSIS — E119 Type 2 diabetes mellitus without complications: Secondary | ICD-10-CM

## 2023-09-10 ENCOUNTER — Ambulatory Visit: Admitting: Student

## 2023-09-10 VITALS — BP 132/80 | HR 85 | Temp 97.5°F | Ht 63.0 in | Wt 214.0 lb

## 2023-09-10 DIAGNOSIS — E119 Type 2 diabetes mellitus without complications: Secondary | ICD-10-CM | POA: Diagnosis not present

## 2023-09-10 DIAGNOSIS — R0683 Snoring: Secondary | ICD-10-CM

## 2023-09-10 DIAGNOSIS — R937 Abnormal findings on diagnostic imaging of other parts of musculoskeletal system: Secondary | ICD-10-CM | POA: Diagnosis not present

## 2023-09-10 DIAGNOSIS — Z6837 Body mass index (BMI) 37.0-37.9, adult: Secondary | ICD-10-CM | POA: Diagnosis not present

## 2023-09-10 DIAGNOSIS — M25511 Pain in right shoulder: Secondary | ICD-10-CM | POA: Diagnosis not present

## 2023-09-10 DIAGNOSIS — G4700 Insomnia, unspecified: Secondary | ICD-10-CM

## 2023-09-10 DIAGNOSIS — Z7985 Long-term (current) use of injectable non-insulin antidiabetic drugs: Secondary | ICD-10-CM | POA: Diagnosis not present

## 2023-09-10 DIAGNOSIS — I10 Essential (primary) hypertension: Secondary | ICD-10-CM | POA: Diagnosis not present

## 2023-09-10 DIAGNOSIS — R936 Abnormal findings on diagnostic imaging of limbs: Secondary | ICD-10-CM

## 2023-09-10 LAB — POCT GLYCOSYLATED HEMOGLOBIN (HGB A1C): HbA1c, POC (controlled diabetic range): 5.4 % (ref 0.0–7.0)

## 2023-09-10 LAB — GLUCOSE, CAPILLARY: Glucose-Capillary: 102 mg/dL — ABNORMAL HIGH (ref 70–99)

## 2023-09-10 MED ORDER — DICLOFENAC SODIUM 1 % EX GEL: 4.0000 g | Freq: Four times a day (QID) | CUTANEOUS | 2 refills | Status: DC

## 2023-09-10 MED ORDER — RAMELTEON 8 MG PO TABS: 8.0000 mg | ORAL_TABLET | Freq: Every day | ORAL | 11 refills | Status: DC

## 2023-09-10 NOTE — Patient Instructions (Addendum)
 Thank you, Carrie Franco for allowing us  to provide your care today. Today we discussed .    Referrals: - Podiatry: foot [facility] at [facility] in [place], [place]    Address: [address] [place], [place], [place] [address] Phone: [phone] .  Physical therapist for your R shoulder: if they don't call you back in 2 weeks, call our office to inquire about  the referral   New medications: - Ramelteon  - 8 mg, one pill about 2 hours before you go to sleep  This is for insomnia - check the information that I attached to this document. If the medications does not work in the next 2 weeks, please call us  back. I have other options that may help   Weight management: - Please continue to work on diet and exercise. If no improvement at our next visit, we may need to increase the dose of the Ozempic , if okay with you of course   Shoulder pain: - Voltaren  gel for your pain - Physical therapy referral - You will need an MRI of the R shoulder to check your humerus (arm bone) and make sure we do not need to do any further work  Snoring: - I have sent the referral for sleep studies. They should call you.  - IF positive, then you will need a cPAP. This will help decrease the strain on your heart, improve your sleep, lower your blood pressure and even improve your weight.   STOP: Valium  and taking your friend's Quetiapine.   I have ordered the following labs for you:   Lab Orders         Glucose, capillary         POC Hbg A1C      I will call if any are abnormal. All of your labs can be accessed through My Chart.   My Chart Access: [email or web address]  Please follow-up in: 3 months or earlier if your insomnia is not better.     We look forward to seeing you next time. Please call our clinic at [phone] if you have any questions or concerns. The best time to call is Monday-Friday from  9am-4pm, but there is someone available 24/7. If after hours or the weekend, call the main hospital number and ask for the Internal Medicine Resident On-Call. If you need medication refills, please notify your pharmacy one week in advance and they will send us  a request.   Thank you for letting us  take part in your care. Wishing you the best!  Elnora Ip, MD 09/10/2023, 3:41 PM [name]   Practice Good Sleep Hygiene Here are some suggestions Avoid napping during the day. It can disturb the normal pattern of sleep and wakefulness.  Avoid stimulants such as caffeine, nicotine, and alcohol  too close to bedtime. While alcohol  is well known to speed the onset of sleep, it disrupts sleep in the second half as the body begins to metabolize the alcohol , causing arousal.  Exercise can promote good sleep. Vigorous exercise should be taken in the morning or late afternoon. A relaxing exercise, like yoga, can be done before bed to help initiate a restful night's sleep. Food can be disruptive right before sleep. Stay away from large meals close to bedtime. Also dietary changes can cause sleep problems, if someone is struggling with a sleep problem, it's not a good time to start experimenting with spicy dishes. And, remember, chocolate has caffeine.  Ensure  adequate exposure to natural light. This is particularly important for older people who may not venture outside as frequently as children and adults. Light exposure helps maintain a healthy sleep-wake cycle.  Establish a regular relaxing bedtime routine. Try to avoid emotionally upsetting conversations and activities before trying to go to sleep. Don't dwell on, or bring your problems to bed.  Associate your bed with sleep. It's not a good idea to use your bed to watch TV, listen to the radio, or read.  Make sure that the sleep environment is pleasant and relaxing. The bed should be comfortable, the room should  not be too hot or cold, or too bright.  The long-term risks and ramifications of untreated moderate to severe obstructive sleep apnea may include (but are not limited to):  - increased risk for cardiovascular disease, including congestive heart failure, stroke, difficult to control hypertension - treatment resistant obesity, arrhythmias, especially irregular heartbeat commonly known as A. Fib. (atrial fibrillation) - even type 2 diabetes has been linked to untreated OSA.    Other correlations that untreated obstructive sleep apnea include macular edema which is swelling of the retina in the eyes, droopy eyelid syndrome, and elevated hemoglobin and hematocrit levels (often referred to as polycythemia).   Sleep apnea can cause disruption of sleep and sleep deprivation in most cases, which, in turn, can cause recurrent headaches, problems with memory, mood, concentration, focus, and vigilance. Most people with untreated sleep apnea report excessive daytime sleepiness, which can affect their ability to drive. Please do not drive or use heavy equipment or machinery if you feel sleepy! Patients with sleep apnea can also develop difficulty initiating and maintaining sleep (aka insomnia).    Having sleep apnea may increase your risk for other sleep disorders, including involuntary behaviors sleep such as sleep terrors, sleep talking, sleepwalking.     Having sleep apnea can also increase your risk for restless leg syndrome and leg movements at night.    Please note that untreated obstructive sleep apnea may carry additional perioperative morbidity. Patients with significant obstructive sleep apnea (typically, in the moderate to severe degree) should receive, if possible, perioperative PAP (positive airway pressure) therapy and the surgeons and particularly the anesthesiologists should be informed of the diagnosis and the severity of the sleep disordered breathing.

## 2023-09-11 ENCOUNTER — Encounter: Attending: Physical Medicine and Rehabilitation | Admitting: Registered Nurse

## 2023-09-11 VITALS — BP 122/79 | HR 85 | Ht 63.0 in | Wt 211.0 lb

## 2023-09-11 DIAGNOSIS — G894 Chronic pain syndrome: Secondary | ICD-10-CM | POA: Diagnosis not present

## 2023-09-11 DIAGNOSIS — M170 Bilateral primary osteoarthritis of knee: Secondary | ICD-10-CM | POA: Diagnosis not present

## 2023-09-11 DIAGNOSIS — M25511 Pain in right shoulder: Secondary | ICD-10-CM | POA: Diagnosis not present

## 2023-09-11 DIAGNOSIS — M47816 Spondylosis without myelopathy or radiculopathy, lumbar region: Secondary | ICD-10-CM

## 2023-09-11 DIAGNOSIS — M79672 Pain in left foot: Secondary | ICD-10-CM | POA: Diagnosis not present

## 2023-09-11 DIAGNOSIS — M546 Pain in thoracic spine: Secondary | ICD-10-CM

## 2023-09-11 DIAGNOSIS — M25562 Pain in left knee: Secondary | ICD-10-CM | POA: Diagnosis not present

## 2023-09-11 MED ORDER — OXYCODONE-ACETAMINOPHEN 5-325 MG PO TABS: 1.0000 | ORAL_TABLET | Freq: Four times a day (QID) | ORAL | 0 refills | Status: DC | PRN
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
## 2023-09-12 ENCOUNTER — Encounter: Payer: Self-pay | Admitting: Student

## 2023-09-12 DIAGNOSIS — G4700 Insomnia, unspecified: Secondary | ICD-10-CM | POA: Insufficient documentation

## 2023-09-12 DIAGNOSIS — R936 Abnormal findings on diagnostic imaging of limbs: Secondary | ICD-10-CM | POA: Insufficient documentation

## 2023-09-12 NOTE — Assessment & Plan Note (Signed)
 Tolerating 1 mg Ozempic  weekly.  Lab Results  Component Value Date   HGBA1C 5.4 09/10/2023   HGBA1C 5.7 (H) 05/22/2023   HGBA1C 6.1 (A) 01/04/2023   - Given prior history of nausea, will continue at 1 mg dose for the time being.  - At next visit, suggest increasing fose
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
## 2023-09-12 NOTE — Assessment & Plan Note (Signed)
 Mildly elevated today, likely secondary to pain. Continues with Amlodipine  10 mg everyday, Olmesartan , and hydrochlorothiazide . Home readings at goal. Reviewed recent BMP in May, within normal limits - Continue current regimen

## 2023-09-12 NOTE — Assessment & Plan Note (Addendum)
 Discussed lifestyle modifications, limited by pain. Currently on Ozempic  1 mg weekly. Weight has decreased, now with a BMI of 37.   - Will continue Ozempic  1 mg weekly - Recommend increasing Ozempic  dose to 2 mg at next outpatient visit if no nausea

## 2023-09-12 NOTE — Assessment & Plan Note (Addendum)
 Problems with sleep maintenance, not onset. Discussed sleep hygiene; does not drink coffee or caffeinated drinks, denies TV or phone in bed. Has tried tylenol  PM, melatonin without improvement. Sleeps about 3 hours every night. Last week tried quetiapine from a friend which helped her. Takes Lyrica  at night, but does not help with sleep maintenance.  Patient with STOP BANG score of 5. Discussed the danger of taking non-prescribed prescription medications. Given trial of OTC medications, educated on other sleep hygiene and trial of Ramelteon . If this does not help and patient has sleep apnea, will treat with cPAP machine.   But if Ramelteon  does not provide relief, I will favor trial of Ambien in this patient.  Plan: - Referral to sleep study - Trial of Ramelteon  8 mg 2 hrs prior to sleep onset decide - No driving while on these medications at night - Education on sleep hygiene

## 2023-09-13 DIAGNOSIS — M25511 Pain in right shoulder: Secondary | ICD-10-CM | POA: Diagnosis not present

## 2023-09-16 ENCOUNTER — Encounter: Payer: Self-pay | Admitting: Registered Nurse

## 2023-09-17 NOTE — Progress Notes (Signed)
 Internal Medicine Clinic Attending  Case discussed with the resident at the time of the visit.  We reviewed the resident's history and exam and pertinent patient test results.  I agree with the assessment, diagnosis, and plan of care documented in the resident's note.

## 2023-09-18 ENCOUNTER — Encounter: Payer: Self-pay | Admitting: Internal Medicine

## 2023-09-19 ENCOUNTER — Ambulatory Visit: Admitting: Podiatry

## 2023-09-20 ENCOUNTER — Encounter: Payer: Self-pay | Admitting: Internal Medicine

## 2023-09-23 ENCOUNTER — Encounter: Payer: Self-pay | Admitting: Internal Medicine

## 2023-09-24 ENCOUNTER — Other Ambulatory Visit

## 2023-09-24 ENCOUNTER — Encounter: Payer: Self-pay | Admitting: Student

## 2023-09-24 ENCOUNTER — Ambulatory Visit: Payer: Self-pay | Admitting: Student

## 2023-09-24 ENCOUNTER — Other Ambulatory Visit (HOSPITAL_COMMUNITY)
Admission: RE | Admit: 2023-09-24 | Discharge: 2023-09-24 | Disposition: A | Discharge status: Home / Self Care | Source: Ambulatory Visit | Attending: Internal Medicine | Admitting: Internal Medicine

## 2023-09-24 VITALS — BP 131/78 | HR 78 | Temp 97.8°F | Ht 63.0 in | Wt 212.4 lb

## 2023-09-24 DIAGNOSIS — N9089 Other specified noninflammatory disorders of vulva and perineum: Secondary | ICD-10-CM | POA: Diagnosis not present

## 2023-09-24 DIAGNOSIS — R936 Abnormal findings on diagnostic imaging of limbs: Secondary | ICD-10-CM | POA: Diagnosis not present

## 2023-09-24 DIAGNOSIS — G4700 Insomnia, unspecified: Secondary | ICD-10-CM | POA: Diagnosis not present

## 2023-09-24 DIAGNOSIS — L292 Pruritus vulvae: Secondary | ICD-10-CM | POA: Diagnosis not present

## 2023-09-24 DIAGNOSIS — N898 Other specified noninflammatory disorders of vagina: Secondary | ICD-10-CM | POA: Diagnosis not present

## 2023-09-24 MED ORDER — ESTRADIOL 0.1 MG/GM VA CREA: 1.0000 | TOPICAL_CREAM | Freq: Every day | VAGINAL | 12 refills | Status: DC

## 2023-09-24 MED ORDER — ESTRADIOL 0.1 MG/GM VA CREA: 0.5000 | TOPICAL_CREAM | Freq: Every day | VAGINAL | 12 refills | Status: AC
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
## 2023-09-24 NOTE — Patient Instructions (Signed)
 Thank you, Ms.Alaiya O Klugh for allowing us  to provide your care today. Today we discussed .    Vulvar itching: - We have sent the infection self swab; I will call you with the results - I am also prescribing something called vaginal estrogen This is a cream applied intravaginally once daily for 2 weeks.  Use half of the applicator per use. If that is improvement on your symptoms after at least 2 weeks, we can keep you on it for maintenance.   Follow up woith our office about the shoulder MRI; we are still waiting your insurance authorization. Please call us  next week  For your insomnia -unfortunately, your insurance does not cover prescription medications that could be used for insomnia in people who have been feeling down again they have sleep apnea.  The best bet is right now is to go ahead and get the sleep studies done to confirm that you have sleep apnea.  I have put the information on these documented below.  Referrals: [facility] in [place], [place]  Address: [address], [place], [place] [address] Phone: [phone]  I please always call us  if your insurance does not approve the test or the medication we have sent  Please follow-up in: Once you have your sleep apnea testing done, call us  back and schedule an appointment 4 weeks after that    We look forward to seeing you next time. Please call our clinic at [phone] if you have any questions or concerns. The best time to call is Monday-Friday from 9am-4pm, but there is someone available 24/7. If after hours or the weekend, call the main hospital number and ask for the Internal Medicine Resident On-Call. If you need medication refills, please notify your pharmacy one week in advance and they will send us  a request.   Thank you for letting us  take part in your care. Wishing you the best!  Elnora Ip, MD 09/24/2023, 9:01 AM [name]

## 2023-09-24 NOTE — Assessment & Plan Note (Signed)
 Going on for about a week.  Severe pruritus around the mons pubis and labia majora.  No intravaginal pruritus per se.  But the vaginal lesions odor without discharge.  Patient reports that she does not use underwear and has switched to using jeans because of the colder weather.  She is not sexually active.  No recurring UTIs in the past.  No LUTS today.  On physical examination there is some irritation along the labia majora and perineum without excoriations, other bleeding, signs of folliculitis.  Bimanual exam and speculum exam without discharge in the vaginal vault, intact vaginal rugae.  No bleeding noted.  No suprapubic tenderness  Suspect some irritation from the changes to vulvar skin.  However given her postmenopausal status; suspect this may be genitourinary symptoms of menopause. Plan: Rule out infection with Aptima swab Trial estradiol  cream for 2 weeks Will call patient with the plan if results come back positive from the Aptima swab Return to clinic if no improvement

## 2023-09-24 NOTE — Addendum Note (Signed)
 Addended by: KRISTY AHR, HADASSAH on: 09/24/2023 02:25 PM   Modules accepted: Orders

## 2023-09-24 NOTE — Assessment & Plan Note (Signed)
 Was not able to pick up the ramelteon  is not covered by [employer].  Discussed the need for sleep study prior to trial medications that may worsen the suspected obstructive sleep apnea -Gave information about sleep study which has been approved by the insurance

## 2023-09-24 NOTE — Assessment & Plan Note (Signed)
 Insurance company has not approved MRI of right humerus yet.  Discussed with Mr. Larrie who is following up on this information.  Patient is to contact us  if she has not received information about this in the next couple of weeks. No preference for imaging location.

## 2023-09-25 ENCOUNTER — Ambulatory Visit: Payer: Self-pay | Admitting: Student

## 2023-09-25 ENCOUNTER — Other Ambulatory Visit: Payer: Self-pay | Admitting: Student

## 2023-09-25 DIAGNOSIS — L309 Dermatitis, unspecified: Secondary | ICD-10-CM

## 2023-09-25 LAB — CERVICOVAGINAL ANCILLARY ONLY
Bacterial Vaginitis (gardnerella): NEGATIVE
Candida Glabrata: NEGATIVE
Candida Vaginitis: NEGATIVE
Chlamydia: NEGATIVE
Comment: NEGATIVE
Comment: NEGATIVE
Comment: NEGATIVE
Comment: NEGATIVE
Comment: NEGATIVE
Comment: NORMAL
Neisseria Gonorrhea: NEGATIVE
Trichomonas: NEGATIVE

## 2023-09-25 NOTE — Progress Notes (Signed)
 Internal Medicine Clinic Attending  Case discussed with the resident at the time of the visit.  We reviewed the resident's history and exam and pertinent patient test results.  I agree with the assessment, diagnosis, and plan of care documented in the resident's note.

## 2023-09-30 ENCOUNTER — Telehealth: Payer: Self-pay | Admitting: *Deleted

## 2023-09-30 ENCOUNTER — Ambulatory Visit: Admitting: Podiatry

## 2023-09-30 ENCOUNTER — Other Ambulatory Visit: Payer: Self-pay | Admitting: Student

## 2023-09-30 DIAGNOSIS — I10 Essential (primary) hypertension: Secondary | ICD-10-CM

## 2023-09-30 NOTE — Telephone Encounter (Signed)
 See previous [employer] message. Formal warning letter (sent) attached to that encounter.

## 2023-10-03 DIAGNOSIS — M25511 Pain in right shoulder: Secondary | ICD-10-CM | POA: Diagnosis not present

## 2023-10-09 ENCOUNTER — Other Ambulatory Visit: Payer: Self-pay | Admitting: Student

## 2023-10-09 DIAGNOSIS — E119 Type 2 diabetes mellitus without complications: Secondary | ICD-10-CM

## 2023-10-10 ENCOUNTER — Encounter: Payer: Self-pay | Admitting: Gastroenterology

## 2023-10-15 ENCOUNTER — Telehealth: Payer: Self-pay | Admitting: Registered Nurse

## 2023-10-15 NOTE — Telephone Encounter (Signed)
 Call Placed to [name],  Explained the letter, she verbalizes understanding. She has a scheduled appointment on 10/21/2023.

## 2023-10-21 ENCOUNTER — Other Ambulatory Visit: Payer: Self-pay | Admitting: Podiatry

## 2023-10-21 ENCOUNTER — Encounter: Attending: Physical Medicine and Rehabilitation | Admitting: Registered Nurse

## 2023-10-21 ENCOUNTER — Ambulatory Visit (INDEPENDENT_AMBULATORY_CARE_PROVIDER_SITE_OTHER): Admitting: Podiatry

## 2023-10-21 ENCOUNTER — Other Ambulatory Visit: Payer: Self-pay | Admitting: Student

## 2023-10-21 ENCOUNTER — Encounter: Payer: Self-pay | Admitting: Registered Nurse

## 2023-10-21 ENCOUNTER — Ambulatory Visit (INDEPENDENT_AMBULATORY_CARE_PROVIDER_SITE_OTHER)

## 2023-10-21 VITALS — BP 119/78 | HR 83 | Ht 63.0 in | Wt 205.0 lb

## 2023-10-21 DIAGNOSIS — Z79891 Long term (current) use of opiate analgesic: Secondary | ICD-10-CM | POA: Diagnosis not present

## 2023-10-21 DIAGNOSIS — M7752 Other enthesopathy of left foot: Secondary | ICD-10-CM | POA: Diagnosis not present

## 2023-10-21 DIAGNOSIS — M778 Other enthesopathies, not elsewhere classified: Secondary | ICD-10-CM

## 2023-10-21 DIAGNOSIS — M25511 Pain in right shoulder: Secondary | ICD-10-CM | POA: Insufficient documentation

## 2023-10-21 DIAGNOSIS — M170 Bilateral primary osteoarthritis of knee: Secondary | ICD-10-CM | POA: Diagnosis not present

## 2023-10-21 DIAGNOSIS — Z5181 Encounter for therapeutic drug level monitoring: Secondary | ICD-10-CM | POA: Diagnosis not present

## 2023-10-21 DIAGNOSIS — M47816 Spondylosis without myelopathy or radiculopathy, lumbar region: Secondary | ICD-10-CM | POA: Diagnosis not present

## 2023-10-21 DIAGNOSIS — L309 Dermatitis, unspecified: Secondary | ICD-10-CM

## 2023-10-21 DIAGNOSIS — G894 Chronic pain syndrome: Secondary | ICD-10-CM | POA: Diagnosis not present

## 2023-10-21 MED ORDER — OXYCODONE-ACETAMINOPHEN 5-325 MG PO TABS: 1.0000 | ORAL_TABLET | Freq: Every day | ORAL | 0 refills | Status: DC | PRN

## 2023-10-21 MED ORDER — IBUPROFEN 800 MG PO TABS: 800.0000 mg | ORAL_TABLET | Freq: Three times a day (TID) | ORAL | 0 refills | Status: AC | PRN

## 2023-10-21 NOTE — Patient Instructions (Signed)
 Ibuprofen  Capsules or Tablets What is this medication? IBUPROFEN  (eye BYOO proe fen) treats mild to moderate pain, inflammation, or arthritis. It may also be used to reduce fever. It belongs to a group of medications called NSAIDs. This medicine may be used for other purposes; ask your health care provider or pharmacist if you have questions. COMMON BRAND NAME(S): Advil , Advil  Junior Strength, Advil  Migraine, ALIVIO, Genpril, Ibren, IBU, Ibupak , Midol , Midol  Cramps and Body Aches, Motrin , Motrin  IB, Motrin  Junior Strength, Motrin  Migraine Pain, Samson-8, Toxicology Saliva Collection What should I tell my care team before I take this medication? They need to know if you have any of these conditions: Asthma Bleeding problems Dehydration Frequently drink alcohol  Have had a heart attack, stroke, or mini-stroke Heart bypass surgery, or CABG, within the past 2 weeks Heart or blood vessel conditions Heart failure High blood pressure Kidney disease Liver disease Lupus Stomach bleeding Stomach ulcers, other stomach or intestine problems Tobacco use An unusual or allergic reaction to ibuprofen , other medications, foods, dyes, or preservatives Pregnant or trying to get pregnant Breastfeeding How should I use this medication? Take this medication by mouth. Take it as directed on the label. You can take it with or without food. If it upsets your stomach, take it with food. Talk to your care team about the use of this medication in children. While it may be prescribed for children as young as 12 for selected conditions, precautions do apply. People 65 years and older may have a stronger reaction and need a smaller dose. If you get this medication as a prescription, a special MedGuide will be given to you by the pharmacist with each prescription and refill. Be sure to read this information carefully each time. Overdosage: If you think you have taken too much of this medicine contact a poison control  center or emergency room at once. NOTE: This medicine is only for you. Do not share this medicine with others. What if I miss a dose? If you take this medication on a regular basis, take it as soon as you can. If it is almost time for your next dose, take only that dose. Do not take double or extra doses. What may interact with this medication? Do not take this medication with any of the following: Cidofovir Ketorolac  This medication may also interact with the following: Alcohol  Aspirin  and aspirin -like medications Blood thinners Cyclosporine Digoxin Diuretics Lithium Medications for blood pressure Methotrexate Other NSAIDs, medications for pain and inflammation, such as naproxen  Some medications for depression Steroid medications, such as prednisone  or cortisone Supplements, such as garlic, ginger, ginkgo, methylsulfonylmethane (MSM) This list may not describe all possible interactions. Give your health care provider a list of all the medicines, herbs, non-prescription drugs, or dietary supplements you use. Also tell them if you smoke, drink alcohol , or use illegal drugs. Some items may interact with your medicine. What should I watch for while using this medication? Visit your care team for regular checks on your progress. Tell your care team if your symptoms do not start to get better or if they get worse. If you need to use this medication for more than 2 days, talk to your care team. Talk to your care team right away if you have a painful sore throat or a sore throat with high fever, headache, nausea, or vomiting. These may be signs of a serious infection. Do not take aspirin  or other NSAIDs, such as ibuprofen  or naproxen , while you are taking this medication. Side effects,  such as upset stomach, nausea, and ulcers, may be more likely to occur. Many over-the-counter medications contain aspirin , ibuprofen , or naproxen . It is important to read labels carefully. Talk to your care team  about all the medications you take. They can tell you what is safe to take together. This medication can cause serious bleeding, ulcers, or tears in the stomach. These problems can occur at any time and with no warning signs. They are more common with long-term use. Talk to your care team right away if you have stomach pain, bloody or black, tar-like stools, or vomit blood that is red or looks like coffee grounds. This medication increases the risk of blood clots, heart attack, and stroke. These events can occur at any time. They are more common with long-term use and in those who have heart disease. If you take aspirin  to prevent a heart attack or stroke, talk to your care team. They can help you find an option that works for you. This medication may cause serious skin reactions. They can happen weeks to months after starting the medication. Talk to your care team right away if you have fevers or flu-like symptoms with a rash. The rash may be red or purple and then turn into blisters or peeling of the skin. Or you might notice a red rash with swelling of the face, lips, or lymph nodes in your neck or under your arms. Talk to your care team if you may be pregnant. Taking this medication after 20 weeks of pregnancy may cause serious birth defects. Use of this medication after 30 weeks of pregnancy is not recommended. This medication may cause infertility. It is usually temporary. Talk to your care team if you are concerned about your fertility. What side effects may I notice from receiving this medication? Side effects that you should report to your care team as soon as possible: Allergic reactions--skin rash, itching, hives, swelling of the face, lips, tongue, or throat Bleeding--bloody or black, tar-like stools, vomiting blood or brown material that looks like coffee grounds, red or dark brown urine, small red or purple spots on skin, unusual bruising or bleeding Heart attack--pain or tightness in the  chest, shoulders, arms, or jaw, nausea, shortness of breath, cold or clammy skin, feeling faint or lightheaded Heart failure--shortness of breath, swelling of the ankles, feet, or hands, sudden weight gain, unusual weakness or fatigue Increase in blood pressure Kidney injury--decrease in the amount of urine, swelling of the ankles, hands, or feet Liver injury--right upper belly pain, loss of appetite, nausea, light-colored stool, dark yellow or brown urine, yellowing skin or eyes, unusual weakness or fatigue Rash, fever, and swollen lymph nodes Redness, blistering, peeling, or loosening of the skin, including inside the mouth Round red or dark patches on the skin that may itch, burn, and blister Stroke--sudden numbness or weakness of the face, arm, or leg, trouble speaking, confusion, trouble walking, loss of balance or coordination, dizziness, severe headache, change in vision Side effects that usually do not require medical attention (report these to your care team if they continue or are bothersome): Headache Loss of appetite Nausea Upset stomach This list may not describe all possible side effects. Call your doctor for medical advice about side effects. You may report side effects to FDA at 1-800-FDA-1088. Where should I keep my medication? Keep out of the reach of children and pets. Store at room temperature between 20 and 25 degrees C (68 and 77 degrees F). Get rid of any unused medication  after the expiration date. To get rid of medications that are no longer needed or have expired: Take the medication to a medication take-back program. Check with your pharmacy or law enforcement to find a location. If you cannot return the medication, check the label or package insert to see if the medication should be thrown out in the garbage or flushed down the toilet. If you are not sure, ask your care team. If it is safe to put it in the trash, empty the medication out of the container. Mix the  medication with cat litter, dirt, coffee grounds, or other unwanted substance. Seal the mixture in a bag or container. Put it in the trash. NOTE: This sheet is a summary. It may not cover all possible information. If you have questions about this medicine, talk to your doctor, pharmacist, or health care provider.  2025 Elsevier/Gold Standard (2023-03-12 00:00:00)
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
## 2023-10-21 NOTE — Progress Notes (Signed)
  Subjective:  Patient ID: Carrie Franco, female    DOB: [birth year],  MRN: [id number]  Chief Complaint  Patient presents with   Foot Pain    Pt stated that she has been having some pain on the top of her foot that went down towards her toes she denies any injuries at this time. She stated that this has been going on for a while now     Discussed the use of AI scribe software for clinical note transcription with the patient, who gave verbal consent to proceed.  History of Present Illness Carrie Franco is a 58 year old female who presents with severe left foot pain.  She has experienced severe left foot pain for about a month, radiating upwards with a noticeable knot on the lateral aspect near the bone. There is no known injury.  She uses Voltaren  gel for temporary relief and prefers ibuprofen  for pain management. She is allergic to meloxicam  and finds Celebrex  ineffective, but can take ibuprofen  without issues. Daily shoe wear for work may contribute to the pain due to rubbing against the affected area.      Objective:  There were no vitals filed for this visit.  Physical Exam General: AAO x3, NAD  Dermatological: Skin is warm, dry and supple bilateral.  There are no open sores, no preulcerative lesions, no rash or signs of infection present.  Vascular: Dorsalis Pedis artery and Posterior Tibial artery pedal pulses are 2/4 bilateral with immedate capillary fill time. There is no pain with calf compression, swelling, warmth, erythema.   Neruologic: Grossly intact via light touch bilateral.   Musculoskeletal: Dorsal spurring present on the left midfoot just lateral to the DP artery.  Palpation there is neuritis to the toes with tenderness pressing on the nodule.  Localized edema.  No erythema or warmth.  There is no other areas of tenderness.  Gait: Unassisted, Nonantalgic.     No images are attached to the encounter.    Results LABS A1c: 5.4 Glucose: 102  (08/2023)  RADIOLOGY Foot X-ray: No significant findings; limited visibility of the area of concern.  Calcaneal spurring is present.   Assessment:   1. Bone spur of left foot   2. Capsulitis of left foot      Plan:  Patient was evaluated and treated and all questions answered.  Assessment and Plan Assessment & Plan Left foot bone or spur with inflammation and pain Chronic left foot pain with palpable bone spur and nerve irritation. - Prescribed ibuprofen  for inflammation and pain, explained potential side effects including kidney issues and gastrointestinal side affects, advised as needed use. - Advised shoe modifications to reduce pressure, such as relacing. - Consider steroid injection if conservative measures fail. She would like to hold off on this today - Discussed surgical options if non-surgical treatments are ineffective.   Return if symptoms worsen or fail to improve.   Donnice JONELLE Fees DPM

## 2023-10-23 ENCOUNTER — Institutional Professional Consult (permissible substitution): Admitting: Neurology

## 2023-10-25 LAB — DRUG TOX MONITOR 1 W/CONF, ORAL FLD
Amphetamines: NEGATIVE ng/mL (ref <10)
Barbiturates: NEGATIVE ng/mL (ref <10)
Benzodiazepines: NEGATIVE ng/mL (ref <0.50)
Buprenorphine: NEGATIVE ng/mL (ref <0.10)
Cocaine: NEGATIVE ng/mL (ref <5.0)
Codeine: NEGATIVE ng/mL (ref <2.5)
Dihydrocodeine: NEGATIVE ng/mL (ref <2.5)
EDDP: NEGATIVE ng/mL (ref <5.0)
Fentanyl: NEGATIVE ng/mL (ref <0.10)
Heroin Metabolite: NEGATIVE ng/mL (ref <1.0)
Hydrocodone: NEGATIVE ng/mL (ref <2.5)
Hydromorphone: NEGATIVE ng/mL (ref <2.5)
MARIJUANA: NEGATIVE ng/mL (ref <2.5)
MDMA: NEGATIVE ng/mL (ref <10)
Meprobamate: NEGATIVE ng/mL (ref <2.5)
Methadone: NEGATIVE ng/mL (ref <5.0)
Methadone: NEGATIVE ng/mL (ref <5.0)
Morphine: NEGATIVE ng/mL (ref <2.5)
Nicotine Metabolite: NEGATIVE ng/mL (ref <5.0)
Norhydrocodone: NEGATIVE ng/mL (ref <2.5)
Noroxycodone: 4.7 ng/mL — ABNORMAL HIGH (ref <2.5)
Opiates: POSITIVE ng/mL — AB (ref <2.5)
Oxycodone: 9.5 ng/mL — ABNORMAL HIGH (ref <2.5)
Oxymorphone: NEGATIVE ng/mL (ref <2.5)
Phencyclidine: NEGATIVE ng/mL (ref <10)
Tapentadol: NEGATIVE ng/mL (ref <5.0)
Tramadol: NEGATIVE ng/mL (ref <5.0)
Zolpidem: NEGATIVE ng/mL (ref <5.0)

## 2023-10-25 LAB — DRUG TOX ALC METAB W/CON, ORAL FLD: Alcohol Metabolite: NEGATIVE ng/mL (ref <25)

## 2023-10-25 NOTE — Telephone Encounter (Signed)
 Copied from CRM [id number]. Topic: Clinical - Prescription Issue >> Oct 25, 2023 10:13 AM DeAngela L wrote: Reason for RMF:[id number] asked patient to call and check the status of refill   triamcinolone  ointment (KENALOG ) 0.1 %  [facility] [address] [place] - [address] [place] [address] Phone: [phone] Fax: [phone]

## 2023-10-28 ENCOUNTER — Other Ambulatory Visit: Payer: Self-pay

## 2023-10-28 NOTE — Patient Instructions (Signed)
 Visit Information  Thank you for taking time to visit with me today. Please don't hesitate to contact me if I can be of assistance to you before our next scheduled appointment.  I will contact you tomorrow, 10/29/23, at 9 AM to assist in connecting you with you care manager through [employer] Dual Eligible Special Needs Plan (D-SNP). Please have your insurance card available to provide your member ID number to the representative.  Please call the care guide team at [phone] if you need to cancel, schedule, or reschedule an appointment.   Please call the Suicide and Crisis Lifeline: 988 call 1-800-273-TALK (toll free, 24 hour hotline) if you are experiencing a Mental Health or Behavioral Health Crisis or need someone to talk to.  [name], RN MSN [place]  VBCI Population Health RN Care Manager Direct Dial: [phone]  Fax: [phone]
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
## 2023-10-29 ENCOUNTER — Telehealth: Payer: Self-pay

## 2023-10-29 NOTE — Patient Outreach (Signed)
 Call placed to patient to assist in connecting with case manager through [facility]-[employer]. Spoke with [name], representative from [employer]. Patient's case manager is [name]. [name] attempted to connect patient with [name], but she is currently unavailable. [name] will contact patient within the next 1-2 days. Advised patient to contact CMRN if she does not hear from [facility].  [name], RN MSN [place]  [facility] [facility] RN Care Manager Direct Dial: [phone]  Fax: [phone]

## 2023-11-01 ENCOUNTER — Other Ambulatory Visit: Payer: Self-pay | Admitting: Student

## 2023-11-06 ENCOUNTER — Institutional Professional Consult (permissible substitution): Admitting: Neurology

## 2023-11-11 ENCOUNTER — Ambulatory Visit: Payer: Self-pay

## 2023-11-11 ENCOUNTER — Ambulatory Visit

## 2023-11-11 VITALS — BP 99/62 | HR 73 | Temp 97.9°F | Ht 63.0 in | Wt 203.8 lb

## 2023-11-11 DIAGNOSIS — I10 Essential (primary) hypertension: Secondary | ICD-10-CM

## 2023-11-11 DIAGNOSIS — R42 Dizziness and giddiness: Secondary | ICD-10-CM | POA: Insufficient documentation

## 2023-11-11 NOTE — Assessment & Plan Note (Signed)
 Symptoms started several weeks ago. Intermittent for months, but symptoms are gradually worsening over the last couple of weeks; occurs when she stands up from picking something off of the ground. When she keeps her head up when bending down, the episodes do not occur. Drinking 16 oz 3-4 bottles of water  a day. Blood pressure today 104/62 heart rate 73. Orthostatic vitals: laying 114/69 HR 69, Sitting 109/62 HR 72, and standing 99/62 HR 73) indicating more Orthostatic intolerance. Started black seed oil which can cause low BP several months ago. Started taking Meta-burn selerb nutrient 1 month ago. Said she lost weight on this plus ozempic  (lost 30 pounds). Echo 07/2022 with grade 1 diastolic dysfunction, no aortic stenosis. No new murmur appreciated, less likely valvular outflow obstruction. Since weight loss, BP has been dropping, with current BP regimen, likely overcorrecting. Endorses: occasional heart palpitations. Denies: chest pain, SOB, new sensory changes, caffeine, smoking, drinking alcohol . - instructed to stop black seed oil and meta-burn selerb nutrient - will pause hydrochlorothiazide   - instructed to try compression stockings  - instructed to drink more water   - come back for BP follow up in 2 weeks.  - BMP ordered - EKG ordered - will f/u in 2 weeks

## 2023-11-11 NOTE — Telephone Encounter (Signed)
 FYI Only or Action Required?: FYI only for provider.  Patient was last seen in primary care on 09/24/2023 by Elnora Ip, MD.  Called Nurse Triage reporting Dizziness.  Symptoms began several weeks ago.  Interventions attempted: Rest, hydration, or home remedies.  Symptoms are: gradually worsening.  Triage Disposition: See PCP When Office is Open (Within 3 Days)  Patient/caregiver understands and will follow disposition?:    Copied from CRM #[id number]. Topic: Clinical - Red Word Triage >> Nov 11, 2023  9:14 AM Carrielelia G wrote: Red Word that prompted transfer to Nurse Triage:  Dizziness. Use to happen once in a while, now it is constant. Reason for Disposition  [1] MILD dizziness (e.g., walking normally) AND [2] has NOT been evaluated by doctor (or NP/PA) for this  (Exception: Dizziness caused by heat exposure, sudden standing, or poor fluid intake.)  Answer Assessment - Initial Assessment Questions 1. DESCRIPTION: Describe your dizziness.     Lightheaded  2. LIGHTHEADED: Do you feel lightheaded? (e.g., somewhat faint, woozy, weak upon standing)     Feels faint  3. VERTIGO: Do you feel like either you or the room is spinning or tilting? (i.e., vertigo)     Denies  4. SEVERITY: How bad is it?  Do you feel like you are going to faint? Can you stand and walk?     Able to go about her day  5. ONSET:  When did the dizziness begin?     Intermittent for months, now happening daily when she stands up.  6. AGGRAVATING FACTORS: Does anything make it worse? (e.g., standing, change in head position)     Standing  7. HEART RATE: Can you tell me your heart rate? How many beats in 15 seconds?  (Note: Not all patients can do this.)       No change 8. CAUSE: What do you think is causing the dizziness? (e.g., decreased fluids or food, diarrhea, emotional distress, heat exposure, new medicine, sudden standing, vomiting; unknown)     Unsure, she feels well  hydrated 9. RECURRENT SYMPTOM: Have you had dizziness before? If Yes, ask: When was the last time? What happened that time?     Yes, several months intermittently  10. OTHER SYMPTOMS: Do you have any other symptoms? (e.g., fever, chest pain, vomiting, diarrhea, bleeding)       Denies. Being treated for BV, does not like cream rx and requesting oral treatment.  Protocols used: Dizziness - Lightheadedness-A-AH
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
## 2023-11-11 NOTE — Patient Instructions (Addendum)
 Today we discussed the following medical conditions and plan:   - Pause the hydrochlorothiazide  (pause, do not throw it away) - Stop the supplements  - Use compression socks, drink water   - BMP ordered (to check your kidneys) - EKG to check your heart rhythm   We look forward to seeing you next time. Please call our clinic at [phone] if you have any questions or concerns. The best time to call is Monday-Friday from 9am-4pm, but there is someone available 24/7. If you need medication refills, please notify your pharmacy one week in advance and they will send us  a request.   Thank you for trusting me with your care. Wishing you the best!   Sallyanne Primas, DO  [facility]

## 2023-11-11 NOTE — Assessment & Plan Note (Addendum)
 Symptoms started several weeks ago. Intermittent for months, but symptoms are gradually worsening over the last couple of weeks; occurs when she stands up from picking something off of the ground. When she keeps her head up when bending down, the episodes do not occur. Drinking 16 oz 3-4 bottles of water  a day. Blood pressure today 104/62 heart rate 73. Orthostatic vitals: laying 114/69 HR 69, Sitting 109/62 HR 72, and standing 99/62 HR 73) indicating more Orthostatic intolerance. Started black seed oil which can cause low BP several months ago. Started taking Meta-burn selerb nutrient 1 month ago. Said she lost weight on this plus ozempic  (lost 30 pounds). Echo 07/2022 with grade 1 diastolic dysfunction, no aortic stenosis. No new murmur appreciated, less likely valvular outflow obstruction. Since weight loss, BP has been dropping, with current BP regimen, likely overcorrecting. Endorses: occasional heart palpitations. Denies: chest pain, SOB, new sensory changes, caffeine, smoking, drinking alcohol . - instructed to stop black seed oil and meta-burn selerb nutrient - will pause hydrochlorothiazide   - instructed to try compression stockings  - instructed to drink more water   - come back for BP follow up in 2 weeks.  - BMP ordered - EKG ordered - will f/u in 2 weeks

## 2023-11-12 ENCOUNTER — Ambulatory Visit: Payer: Self-pay

## 2023-11-12 ENCOUNTER — Other Ambulatory Visit: Payer: Self-pay | Admitting: Student

## 2023-11-12 DIAGNOSIS — E119 Type 2 diabetes mellitus without complications: Secondary | ICD-10-CM

## 2023-11-12 LAB — BASIC METABOLIC PANEL WITH GFR
BUN/Creatinine Ratio: 17 (ref 9–23)
BUN: 15 mg/dL (ref 6–24)
CO2: 24 mmol/L (ref 20–29)
Calcium: 9.5 mg/dL (ref 8.7–10.2)
Chloride: 103 mmol/L (ref 96–106)
Creatinine, Ser: 0.89 mg/dL (ref 0.57–1.00)
Glucose: 75 mg/dL (ref 70–99)
Potassium: 3.7 mmol/L (ref 3.5–5.2)
Sodium: 142 mmol/L (ref 134–144)
eGFR: 75 mL/min/1.73 (ref >59)

## 2023-11-12 NOTE — Progress Notes (Signed)
 Internal Medicine Clinic Attending  I was physically present during the key portions of the resident provided service and participated in the medical decision making of patient's management care. I reviewed pertinent patient test results.  The assessment, diagnosis, and plan were formulated together and I agree with the documentation in the resident's note.  Lovie Clarity, MD    Patient has successfully lost 20 pounds on her GLP-1 and has many good lifestyle changes.  In this setting, I think we may now be over-treating her hypertension on the 3 medications.  I agree with plan to pause hydrochlorothiazide  today, with RN visit in 2 weeks for repeat blood pressure check.  I'm hopeful that we can stop hydrochlorothiazide .  Also, stop the new supplements, as instructed in Dr Saint note.

## 2023-11-13 ENCOUNTER — Ambulatory Visit: Admitting: Orthopedic Surgery

## 2023-11-13 ENCOUNTER — Encounter: Payer: Self-pay | Admitting: Orthopedic Surgery

## 2023-11-13 ENCOUNTER — Other Ambulatory Visit (INDEPENDENT_AMBULATORY_CARE_PROVIDER_SITE_OTHER)

## 2023-11-13 DIAGNOSIS — M25562 Pain in left knee: Secondary | ICD-10-CM

## 2023-11-13 DIAGNOSIS — M25561 Pain in right knee: Secondary | ICD-10-CM | POA: Diagnosis not present
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
## 2023-11-18 ENCOUNTER — Encounter: Payer: Self-pay | Admitting: Radiology

## 2023-11-19 ENCOUNTER — Other Ambulatory Visit: Payer: Self-pay | Admitting: Student

## 2023-11-19 DIAGNOSIS — J309 Allergic rhinitis, unspecified: Secondary | ICD-10-CM

## 2023-11-19 NOTE — Telephone Encounter (Signed)
 Medication sent to pharmacy

## 2023-11-21 DIAGNOSIS — E119 Type 2 diabetes mellitus without complications: Secondary | ICD-10-CM | POA: Diagnosis not present

## 2023-11-21 LAB — OPHTHALMOLOGY REPORT-SCANNED

## 2023-11-26 ENCOUNTER — Encounter: Attending: Physical Medicine and Rehabilitation | Admitting: Physical Medicine and Rehabilitation

## 2023-11-26 DIAGNOSIS — M47816 Spondylosis without myelopathy or radiculopathy, lumbar region: Secondary | ICD-10-CM | POA: Insufficient documentation

## 2023-11-26 DIAGNOSIS — G894 Chronic pain syndrome: Secondary | ICD-10-CM | POA: Insufficient documentation

## 2023-11-26 DIAGNOSIS — M25511 Pain in right shoulder: Secondary | ICD-10-CM | POA: Insufficient documentation

## 2023-11-26 DIAGNOSIS — M170 Bilateral primary osteoarthritis of knee: Secondary | ICD-10-CM | POA: Insufficient documentation

## 2023-11-26 DIAGNOSIS — Z79891 Long term (current) use of opiate analgesic: Secondary | ICD-10-CM | POA: Insufficient documentation

## 2023-11-26 DIAGNOSIS — Z5181 Encounter for therapeutic drug level monitoring: Secondary | ICD-10-CM | POA: Insufficient documentation

## 2023-12-02 ENCOUNTER — Telehealth: Payer: Self-pay | Admitting: Student

## 2023-12-02 DIAGNOSIS — K219 Gastro-esophageal reflux disease without esophagitis: Secondary | ICD-10-CM

## 2023-12-02 NOTE — Telephone Encounter (Unsigned)
 Copied from CRM #[id number]. Topic: Clinical - Medication Refill >> Dec 02, 2023  3:10 PM DeAngela L wrote: Medication: pantoprazole  (PROTONIX ) 40 MG tablet pregabalin  (LYRICA ) 100 MG capsule olmesartan  (BENICAR ) 40 MG tablet diclofenac  Sodium (VOLTAREN ) 1 % GEL valACYclovir  (VALTREX ) 500 MG tablet   Has the patient contacted their pharmacy? Yes (Agent: If no, request that the patient contact the pharmacy for the refill. If patient does not wish to contact the pharmacy document the reason why and proceed with request.) (Agent: If yes, when and what did the pharmacy advise?)  This is the patient's preferred pharmacy:  [facility] [address] [place] - [address] [place] [place] [address] Phone: [phone] Fax: [phone]  Is this the correct pharmacy for this prescription? Yes  If no, delete pharmacy and type the correct one.   Has the prescription been filled recently? Yes   Is the patient out of the medication? Not for the first 4 meds, but the VALTREX  yes she is out of medication   Has the patient been seen for an appointment in the last year OR does the patient have an upcoming appointment? Yes   Can we respond through MyChart? Yes   Agent: Please be advised that Rx refills may take up to 3 business days. We ask that you follow-up with your pharmacy.

## 2023-12-03 ENCOUNTER — Ambulatory Visit

## 2023-12-03 ENCOUNTER — Telehealth: Payer: Self-pay

## 2023-12-03 VITALS — Ht 63.0 in | Wt 205.8 lb

## 2023-12-03 DIAGNOSIS — Z860100 Personal history of colon polyps, unspecified: Secondary | ICD-10-CM

## 2023-12-03 NOTE — Progress Notes (Signed)
 PCP MD at time of PV: Hadassah Sharps  __________________________________________________________________________________________________________________________________________  No egg allergy known to patient  No soy allergy known to patient No issues known to pt with past sedation with any surgeries or procedures Patient denies ever being told they had issues or difficulty with intubation  No FH of Malignant Hyperthermia Pt on diet pills - phentermine  Pt is not on  home 02  Pt is not on blood thinners  No A fib or A flutter Have any cardiac testing pending--no  LOA: independent  No Chew or Snuff tobacco __________________________________________________________________________________________________________________________________________  Constipation: yes  Prep: suprep  __________________________________________________________________________________________________________________________________________  PV completed with patient. Prep instructions reviewed and provided during apt. Rx sent to preferred pharmacy.  __________________________________________________________________________________________________________________________________________  Patient's chart reviewed by Norleen Schillings CNRA prior to previsit and patient appropriate for the [facility].  Previsit completed and red dot placed by patient's name on their procedure day (on provider's schedule).

## 2023-12-03 NOTE — Telephone Encounter (Signed)
 Multiple attempts made to reach patient via phone- NO answer-message left for patient to call back to the office to reschedule PV appt- if patient fails to call back to the office prior to end of business day ---PV and procedure appts will be cancelled and a no show letter will be sent to the patient;

## 2023-12-09 ENCOUNTER — Telehealth: Payer: Self-pay | Admitting: Pharmacist

## 2023-12-09 ENCOUNTER — Encounter: Payer: Self-pay | Admitting: Pharmacist

## 2023-12-09 NOTE — Progress Notes (Signed)
 Pharmacy Quality Measure Review  This patient is appearing on the insurance-providing list for being at risk of failing the adherence measure for Statin Use in Persons with Diabetes (SUPD) medications this calendar year.   History of statin myopathy, diagnosis code has not been used this year to exclude from the quality measure. Also appears patient is due to follow up with Internal Medicine - visit on 10/27 for dizziness.   Will route to embedded pharmacist.   [facility], PharmD, [facility] Clinical Pharmacist [phone]

## 2023-12-10 ENCOUNTER — Other Ambulatory Visit: Payer: Self-pay | Admitting: Student

## 2023-12-10 ENCOUNTER — Other Ambulatory Visit: Payer: Self-pay | Admitting: *Deleted

## 2023-12-10 DIAGNOSIS — E119 Type 2 diabetes mellitus without complications: Secondary | ICD-10-CM

## 2023-12-10 DIAGNOSIS — M25511 Pain in right shoulder: Secondary | ICD-10-CM

## 2023-12-10 DIAGNOSIS — I10 Essential (primary) hypertension: Secondary | ICD-10-CM

## 2023-12-10 MED ORDER — DICLOFENAC SODIUM 1 % EX GEL: 4.0000 g | Freq: Four times a day (QID) | CUTANEOUS | 2 refills | Status: DC

## 2023-12-10 MED ORDER — OLMESARTAN MEDOXOMIL 40 MG PO TABS: 40.0000 mg | ORAL_TABLET | Freq: Every day | ORAL | 1 refills | Status: DC

## 2023-12-10 NOTE — Telephone Encounter (Signed)
 Also Valtrex .

## 2023-12-10 NOTE — Telephone Encounter (Signed)
 Copied from CRM [id number]. Topic: Clinical - Prescription Issue >> Dec 10, 2023 12:19 PM Graeme ORN wrote: Reason for CRM: Patient called to check status of medication request. States she requested 4. I show two were sent in but not the  olmesartan  (BENICAR ) 40 MG tablet diclofenac  Sodium (VOLTAREN ) 1 % GEL valACYclovir  (VALTREX ) 500 MG tablet. She also would like an update on Ozempic . States she is leaving Friday to go out of town and she knows the office is closed. She is out. Next dose due tomorrow. Will have to miss two weeks if not sent in today or tomorrow. Thank You

## 2023-12-18 ENCOUNTER — Telehealth: Payer: Self-pay | Admitting: Student

## 2023-12-18 DIAGNOSIS — M25511 Pain in right shoulder: Secondary | ICD-10-CM

## 2023-12-18 MED ORDER — DICLOFENAC SODIUM 1 % EX GEL: 4.0000 g | Freq: Four times a day (QID) | CUTANEOUS | 2 refills | Status: AC

## 2023-12-18 NOTE — Telephone Encounter (Signed)
 Copied from CRM [id number]. Topic: Clinical - Prescription Issue >> Dec 18, 2023  9:54 AM Debby BROCKS wrote: Reason for CRM: [facility] states that they received a prescription for diclofenac  Sodium (VOLTAREN ) 1 % GEL but its listed as 4 grams. The pharmacy states it needs to be resent as 100 gram tubes as that's what they have  [facility] [address], [place] - [address] [phone]

## 2023-12-18 NOTE — Telephone Encounter (Signed)
 Will forward to PCP to redo the prescription.

## 2023-12-20 NOTE — Progress Notes (Signed)
 Attempted to call patient to follow-up and see if she would be willing to schedule pharmacy clinic appt to follow-up on hypotension after OV with Dr. Benuel on 11/11/23. Unable to reach patient. VM full. Will contact via MyChart to try to arrange opportunity for follow-up.   Lorain Baseman, PharmD [facility] [phone]

## 2023-12-24 ENCOUNTER — Other Ambulatory Visit: Payer: Self-pay | Admitting: Student

## 2023-12-24 DIAGNOSIS — I10 Essential (primary) hypertension: Secondary | ICD-10-CM

## 2023-12-24 NOTE — Telephone Encounter (Signed)
 Medication sent to pharmacy

## 2023-12-26 ENCOUNTER — Other Ambulatory Visit: Payer: Self-pay

## 2023-12-26 ENCOUNTER — Telehealth: Payer: Self-pay | Admitting: *Deleted

## 2023-12-26 ENCOUNTER — Ambulatory Visit

## 2023-12-26 ENCOUNTER — Other Ambulatory Visit (HOSPITAL_COMMUNITY)
Admission: RE | Admit: 2023-12-26 | Discharge: 2023-12-26 | Disposition: A | Source: Ambulatory Visit | Attending: Internal Medicine | Admitting: Internal Medicine

## 2023-12-26 VITALS — BP 124/78 | HR 85 | Temp 98.7°F | Ht 63.0 in | Wt 206.6 lb

## 2023-12-26 DIAGNOSIS — A6000 Herpesviral infection of urogenital system, unspecified: Secondary | ICD-10-CM | POA: Diagnosis not present

## 2023-12-26 DIAGNOSIS — Z79899 Other long term (current) drug therapy: Secondary | ICD-10-CM

## 2023-12-26 DIAGNOSIS — Z7251 High risk heterosexual behavior: Secondary | ICD-10-CM | POA: Diagnosis not present

## 2023-12-26 DIAGNOSIS — B9689 Other specified bacterial agents as the cause of diseases classified elsewhere: Secondary | ICD-10-CM

## 2023-12-26 DIAGNOSIS — Z113 Encounter for screening for infections with a predominantly sexual mode of transmission: Secondary | ICD-10-CM | POA: Diagnosis not present

## 2023-12-26 DIAGNOSIS — J45909 Unspecified asthma, uncomplicated: Secondary | ICD-10-CM | POA: Diagnosis not present

## 2023-12-26 DIAGNOSIS — Z9189 Other specified personal risk factors, not elsewhere classified: Secondary | ICD-10-CM | POA: Diagnosis not present

## 2023-12-26 DIAGNOSIS — J069 Acute upper respiratory infection, unspecified: Secondary | ICD-10-CM

## 2023-12-26 DIAGNOSIS — N760 Acute vaginitis: Secondary | ICD-10-CM

## 2023-12-26 DIAGNOSIS — R0602 Shortness of breath: Secondary | ICD-10-CM

## 2023-12-26 MED ORDER — ALBUTEROL SULFATE HFA 108 (90 BASE) MCG/ACT IN AERS: 2.0000 | INHALATION_SPRAY | Freq: Four times a day (QID) | RESPIRATORY_TRACT | 3 refills | Status: AC | PRN

## 2023-12-26 MED ORDER — VALACYCLOVIR HCL 500 MG PO TABS: 500.0000 mg | ORAL_TABLET | Freq: Every day | ORAL | 5 refills | Status: AC

## 2023-12-26 MED ORDER — FLUTICASONE-SALMETEROL 115-21 MCG/ACT IN AERO: 2.0000 | INHALATION_SPRAY | Freq: Two times a day (BID) | RESPIRATORY_TRACT | 1 refills | Status: DC

## 2023-12-26 MED ORDER — BENZONATATE 100 MG PO CAPS: 100.0000 mg | ORAL_CAPSULE | Freq: Three times a day (TID) | ORAL | 0 refills | Status: AC | PRN

## 2023-12-26 NOTE — Telephone Encounter (Signed)
 Ms Hilyer is requesting a call back from Fidela Ned NP.

## 2023-12-26 NOTE — Progress Notes (Unsigned)
 Patient name: Carrie Franco Date of birth: 1965-04-21 Date of visit: 12/29/2023  Type of visit: Acute Office Visit   Subjective   Chief concern:  Chief Complaint  Patient presents with   vaginal odor    Pt states fishy odor has been going on for 3 days.    Nasal Congestion    Pt states nasal congestion has been going on since Saturday. Also has been using inhaler to try to help.     Carrie Franco is a 58 y.o. female with a history of DMII, HTN who presents to [facility] clinic for evaluation of nasal congestion and recent sexual encounter.  Patient recently went on a [place] to [place], [place].  She returned over the weekend and notes that she has had congestion since Saturday 12/6.  She reports it general foggy headed feeling, and has been stopped up.  She denies having a runny nose, fever, chills, or sore throat.  She has had a mild cough which has been nonproductive, though she feels like she wants to cough things up.  She has tried some Mucinex DM, which she states has helped.  She does have a history of persistent asthma, and is requesting a referral to pulmonology for formal PFTs once she is feeling better.  She also notes that she recently had a new sexual encounter while on vacation, and would like to be tested for STIs.  They did use condom barrier protection.  She has noted a recent fishy vaginal odor with white discharge, and states that if she does need to be treated with metronidazole , she would prefer pills then not the vaginal gel.  She is requesting a refill of Valtrex .  ROS: Negative unless otherwise listed in the HPI.  Patient Active Problem List   Diagnosis Date Noted   Genital herpes 12/29/2023   Encounter for screening examination for sexually transmitted infection 12/29/2023   Lightheaded 11/11/2023   Vulvovaginal pruritus 09/24/2023   Abnormal x-ray of humerus 09/12/2023   Insomnia 09/12/2023   Left foot pain 08/05/2023    BMI 37.0-37.9, adult with comorbidity 02/21/2023   Type 2 diabetes mellitus (HCC) 09/20/2022   Upper respiratory infection 09/07/2020   Cervical radiculopathy 08/12/2020   Aortic atherosclerosis 02/17/2020   Emphysema of lung (HCC) 02/17/2020   Radiculopathy of lumbosacral region 06/24/2017   Depression 01/02/2017   Osteoarthritis 12/05/2015   Eczema 04/01/2015   Hyperlipidemia associated with type 2 diabetes mellitus (HCC) 07/31/2013   GERD (gastroesophageal reflux disease) 03/19/2013   Persistent asthma 02/23/2010   Healthcare maintenance 02/23/2010   Essential hypertension 01/13/2007   Allergic sinusitis 01/02/2006     Past Surgical History:  Procedure Laterality Date   BILATERAL CARPAL TUNNEL RELEASE Bilateral 10/14/2019   Procedure: BILATERAL CARPAL TUNNEL RELEASE, right first dorsal compartment tenosynovectomy;  Surgeon: Shari Easter, MD;  Location: [facility];  Service: Orthopedics;  Laterality: Bilateral;  Local   COLONOSCOPY     KNEE ARTHROSCOPY Right    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   Procedure: RIGHT TOTAL KNEE ARTHROPLASTY;  Surgeon: Addie Cordella Hamilton, MD;  Location[facility] OR;  Service: Orthopedics;  Laterality: Right;   TUBAL LIGATION     tummy tuck  03/2010     Current Outpatient Medications  Medication Instructions   albuterol  (VENTOLIN  HFA) 108 (90 Base) MCG/ACT inhaler 2 puffs, Inhalation, Every 6 hours PRN   amLODipine  (NORVASC ) 10 mg, Oral, Daily   benzonatate  (TESSALON ) 100 mg, Oral,  3 times daily PRN   cetirizine  (ZYRTEC ) 10 mg, Oral, Daily   cyclobenzaprine  (FLEXERIL ) 10 mg, Oral, 3 times daily PRN, for muscle spams   diazepam  (VALIUM ) 2 mg, Oral, Every 6 hours PRN   diclofenac  Sodium (VOLTAREN ) 4 g, Topical, 4 times daily   diphenoxylate -atropine  (LOMOTIL ) 2.5-0.025 MG tablet 1 tablet, Oral, 4 times daily PRN   FLUoxetine  (PROZAC ) 40 mg, Oral, Daily   fluticasone  (FLONASE ) 50 MCG/ACT nasal spray 2 sprays, Daily PRN    fluticasone -salmeterol (ADVAIR HFA) 115-21 MCG/ACT inhaler 2 puffs, Inhalation, 2 times daily   ibuprofen  (ADVIL ) 800 mg, Oral, Every 8 hours PRN   Lido-Capsaicin-Men-Methyl Sal (1ST MEDX-PATCH/ LIDOCAINE ) 4-0.025-5-20 % PTCH 1 patch, Apply externally, Every 24 hours   metroNIDAZOLE  (FLAGYL ) 500 mg, Oral, 2 times daily   NEXLETOL  180 MG TABS TAKE 1 TABLET BY MOUTH ONCE DAILY IN  THE  AFTERNOON   olmesartan  (BENICAR ) 40 mg, Oral, Daily   ondansetron  (ZOFRAN -ODT) 4 mg, Oral, Every 8 hours PRN   oxyCODONE -acetaminophen  (PERCOCET) 5-325 MG tablet 1 tablet, Oral, 5 times daily PRN   OZEMPIC , 1 MG/DOSE, 4 MG/3ML SOPN INJECT 1 MG  SUBCUTANEOUSLY ONCE A WEEK   pantoprazole  (PROTONIX ) 40 mg, Oral, Daily   phentermine (ADIPEX-P) 37.5 mg, Daily   pregabalin  (LYRICA ) 100 mg, Oral, 2 times daily   triamcinolone  ointment (KENALOG ) 0.1 % Topical, 2 times daily PRN   valACYclovir  (VALTREX ) 500 mg, Oral, Daily    Social History[1]    Objective  Today's Vitals   12/26/23 0923 12/26/23 0929 12/26/23 1014  BP: (!) 142/83 (!) 143/77 124/78  Pulse:  85   Temp: 98.7 F (37.1 C)    TempSrc: Oral    SpO2: 96%    Weight: 206 lb 9.6 oz (93.7 kg)    Height: 5' 3 (1.6 m)    Body mass index is 36.6 kg/m.   Physical Exam:   Constitutional: well-appearing female sitting in exam chair, in no acute distress. Ambulates without use of assistance device HEENT: normocephalic atraumatic, mucous membranes moist Eyes: conjunctiva non-erythematous Neck: supple, mildly enlarged lymph cervical lymph nodes  Cardiovascular: regular rate and rhythm, bilateral radial pulses 2+, bilateral dorsal pedal pulses 2+, brisk capillary refill bilateral feet and hands  Pulmonary/Chest: normal work of breathing on room air, lungs clear to auscultation bilaterally Abdominal: soft, non-tender, non-distended MSK: normal bulk and tone. Neurological: alert & oriented x 3 Skin: warm and dry Psych: mood calm, behavior normal, thought  content normal, judgement normal      The 10-year ASCVD risk score (Arnett DK, et al., 2019) is: 11.4%   Values used to calculate the score:     Age: 26 years     Clinically relevant sex: Female     Is Non-Hispanic African American: Yes     Diabetic: Yes     Tobacco smoker: No     Systolic Blood Pressure: 124 mmHg     Is BP treated: Yes     HDL Cholesterol: 47 mg/dL     Total Cholesterol: 149 mg/dL      Assessment & Plan  Problem List Items Addressed This Visit     Persistent asthma   Patient has a history of persistent asthma, for which she uses albuterol  inhaler as needed, and fluticasone -salmeterol 2 puffs twice daily.  She has not had formal PFTs, and would like to pursue that once she is feeling better from her URI currently. - Referral to Pulmonology for formal PFTs - Continue Albuterol  prn -  Continue Advair 2 puffs BID      Relevant Medications   fluticasone -salmeterol (ADVAIR HFA) 115-21 MCG/ACT inhaler   albuterol  (VENTOLIN  HFA) 108 (90 Base) MCG/ACT inhaler   Other Relevant Orders   Ambulatory referral to Pulmonology   Upper respiratory infection - Primary   Patient presents to clinic today with symptoms of nasal congestion, and mild cough ever since she returned from her [place] vacation over the weekend.  She has been using MucinexDM which has helped her congestion, though she states it makes her feel a little bit groggy during the day.  We discussed that she could try using Mucinex without the dextromethorphan  portion during the day and at nighttime if she is requiring cough suppressant, that is when she could use MucinexDM.  I will send in some Tessalon  Perles for her, and we discussed obtaining Neti pot to help with nasal congestion.  I also discussed that she could increase her Flonase  to twice daily during this time.  Discussed that she should remain hydrated, and if symptoms are worsening or not improving over the next 2 weeks, then to let us  know. -  Continue Mucinex - Tessalon  perles, Neti pot, increase Flonase  to BID, staying hydrated       Relevant Medications   benzonatate  (TESSALON ) 100 MG capsule   valACYclovir  (VALTREX ) 500 MG tablet   metroNIDAZOLE  (FLAGYL ) 500 MG tablet   Genital herpes   Patient has a history of genital HSV-2.  She is not currently having an active flare. She is requesting a refill of valacyclovir  500 mg daily. - Continue valacyclovir  500 mg daily      Relevant Medications   valACYclovir  (VALTREX ) 500 MG tablet   metroNIDAZOLE  (FLAGYL ) 500 MG tablet   Encounter for screening examination for sexually transmitted infection   Patient reports a recent sexual encounter while she was on vacation.  They did use barrier condoms, though she would like to have STI panel workup.  She reports that since then, she has had a fishy vaginal odor with white discharge.  She states that if she does need to be treated with any metronidazole , she would prefer the pill form and not the vaginal gel.  - f/u HIV, Hep C, RPR - f/u Cervicovaginal swab       Other Visit Diagnoses       Unprotected sexual intercourse         History of sexual intercourse       Relevant Medications   metroNIDAZOLE  (FLAGYL ) 500 MG tablet   Other Relevant Orders   HIV antibody (with reflex) (Completed)   Hepatitis C Ab reflex to Quant PCR (Completed)   Cervicovaginal ancillary only (Completed)   RPR (Completed)     Shortness of breath       Relevant Medications   fluticasone -salmeterol (ADVAIR HFA) 115-21 MCG/ACT inhaler   albuterol  (VENTOLIN  HFA) 108 (90 Base) MCG/ACT inhaler     Bacterial vaginosis       Relevant Medications   valACYclovir  (VALTREX ) 500 MG tablet   metroNIDAZOLE  (FLAGYL ) 500 MG tablet       Return if symptoms worsen or fail to improve.  Patient discussed with Dr. Lovie, who also saw and evaluated the patient.  Doyal Miyamoto, MD [facility]  PGY-1 12/29/2023, 2:43 PM      [1]  Social History Tobacco Use    Smoking status: Former    Current packs/day: 0.00    Types: Cigarettes    Quit date: 07/09/1999  Years since quitting: 24.4   Smokeless tobacco: Never  Vaping Use   Vaping status: Never Used  Substance Use Topics   Alcohol  use: No    Alcohol /week: 0.0 standard drinks of alcohol    Drug use: No

## 2023-12-26 NOTE — Patient Instructions (Addendum)
 Thank you, Carrie Franco for allowing us  to provide your care today. Today we discussed the following:  - I will call you with your lab results or send you a MyChart message when they come through  - Make sure you are staying well hydrated, try using the Neti Pot, and I've sent Tessalon  for you  I have ordered the following labs for you:   Lab Orders         HIV antibody (with reflex)         Hepatitis C Ab reflex to Quant PCR         RPR       I have ordered the following medication/changed the following medications:   Start the following medications: Meds ordered this encounter  Medications   benzonatate  (TESSALON ) 100 MG capsule    Sig: Take 1 capsule (100 mg total) by mouth 3 (three) times daily as needed for cough.    Dispense:  20 capsule    Refill:  0   valACYclovir  (VALTREX ) 500 MG tablet    Sig: Take 1 tablet (500 mg total) by mouth daily.    Dispense:  30 tablet    Refill:  5   fluticasone -salmeterol (ADVAIR HFA) 115-21 MCG/ACT inhaler    Sig: Inhale 2 puffs into the lungs 2 (two) times daily.    Dispense:  12 g    Refill:  1   albuterol  (VENTOLIN  HFA) 108 (90 Base) MCG/ACT inhaler    Sig: Inhale 2 puffs into the lungs every 6 (six) hours as needed for wheezing or shortness of breath.    Dispense:  8 g    Refill:  3     Follow up: as needed if symptoms are worsening or not improved     Should you have any questions or concerns please call the Internal Medicine Clinic at [phone].     Doyal Miyamoto, MD [facility]

## 2023-12-27 ENCOUNTER — Telehealth: Payer: Self-pay | Admitting: Registered Nurse

## 2023-12-27 ENCOUNTER — Ambulatory Visit: Payer: Self-pay

## 2023-12-27 DIAGNOSIS — M47816 Spondylosis without myelopathy or radiculopathy, lumbar region: Secondary | ICD-10-CM

## 2023-12-27 DIAGNOSIS — M170 Bilateral primary osteoarthritis of knee: Secondary | ICD-10-CM

## 2023-12-27 DIAGNOSIS — G894 Chronic pain syndrome: Secondary | ICD-10-CM

## 2023-12-27 LAB — CERVICOVAGINAL ANCILLARY ONLY
Bacterial Vaginitis (gardnerella): POSITIVE — AB
Candida Glabrata: NEGATIVE
Candida Vaginitis: NEGATIVE
Chlamydia: NEGATIVE
Comment: NEGATIVE
Comment: NEGATIVE
Comment: NEGATIVE
Comment: NEGATIVE
Comment: NEGATIVE
Comment: NORMAL
Neisseria Gonorrhea: NEGATIVE
Trichomonas: NEGATIVE

## 2023-12-27 LAB — SYPHILIS: RPR W/REFLEX TO RPR TITER AND TREPONEMAL ANTIBODIES, TRADITIONAL SCREENING AND DIAGNOSIS ALGORITHM: RPR Ser Ql: NONREACTIVE

## 2023-12-27 NOTE — Telephone Encounter (Signed)
 error

## 2023-12-27 NOTE — Telephone Encounter (Signed)
 PDMP was Reviewed,  Call placed to [name], voicemail full  Oxycodone  e-scribed to pharmacy.

## 2023-12-27 NOTE — Telephone Encounter (Signed)
 Patient said she needs her percocet refilled.  Please call her.

## 2023-12-27 NOTE — Telephone Encounter (Signed)
 FYI Only or Action Required?: Action required by provider: medication refill request and update on patient condition.  Patient was last seen in primary care on 12/26/2023 by Leontine Lapine, MD.  Called Nurse Triage reporting Results.  Triage Disposition: Call PCP Within 24 Hours  Patient/caregiver understands and will follow disposition?: Yes   Message from Sedillo G sent at 12/27/2023  3:22 PM EST  Summary: itchy and odor, when will lab results be available   Reason for Triage: Questioning when will lab results be available and  would like to speak with a nurse. Patient stated she is still itchy and have an odor.        Per chart:  Bacterial Vaginitis (gardnerella) Positive Abnormal     Reason for Disposition  [1] Follow-up call from patient regarding patient's clinical status AND [2] information NON-URGENT  Answer Assessment - Initial Assessment Questions 1. REASON FOR CALL or QUESTION: What is your reason for calling today? or How can I best     Returned patients call and discussed lab results of positive result of BV. She questioned if metronidazole  had been sent as she wants pills not gel. Discussed I did not see medication at this time in chart or in recent med list but I would send request to provider. She requested rx be sent to [facility]. Please advise.  Protocols used: PCP Call - No Triage-A-AH

## 2023-12-28 ENCOUNTER — Other Ambulatory Visit: Payer: Self-pay | Admitting: Student

## 2023-12-28 DIAGNOSIS — K219 Gastro-esophageal reflux disease without esophagitis: Secondary | ICD-10-CM

## 2023-12-28 LAB — HCV INTERPRETATION

## 2023-12-28 LAB — HIV ANTIBODY (ROUTINE TESTING W REFLEX): HIV Screen 4th Generation wRfx: NONREACTIVE

## 2023-12-28 LAB — HCV AB W REFLEX TO QUANT PCR: HCV Ab: NONREACTIVE

## 2023-12-29 DIAGNOSIS — A6000 Herpesviral infection of urogenital system, unspecified: Secondary | ICD-10-CM | POA: Insufficient documentation

## 2023-12-29 DIAGNOSIS — Z113 Encounter for screening for infections with a predominantly sexual mode of transmission: Secondary | ICD-10-CM | POA: Insufficient documentation

## 2023-12-29 MED ORDER — METRONIDAZOLE 500 MG PO TABS: 500.0000 mg | ORAL_TABLET | Freq: Two times a day (BID) | ORAL | 0 refills | Status: AC

## 2023-12-29 NOTE — Assessment & Plan Note (Signed)
 Patient has a history of genital HSV-2.  She is not currently having an active flare. She is requesting a refill of valacyclovir  500 mg daily. - Continue valacyclovir  500 mg daily

## 2023-12-29 NOTE — Assessment & Plan Note (Signed)
 Patient presents to clinic today with symptoms of nasal congestion, and mild cough ever since she returned from her [place] vacation over the weekend.  She has been using MucinexDM which has helped her congestion, though she states it makes her feel a little bit groggy during the day.  We discussed that she could try using Mucinex without the dextromethorphan  portion during the day and at nighttime if she is requiring cough suppressant, that is when she could use MucinexDM.  I will send in some Tessalon  Perles for her, and we discussed obtaining Neti pot to help with nasal congestion.  I also discussed that she could increase her Flonase  to twice daily during this time.  Discussed that she should remain hydrated, and if symptoms are worsening or not improving over the next 2 weeks, then to let us  know. - Continue Mucinex - Tessalon  perles, Neti pot, increase Flonase  to BID, staying hydrated

## 2023-12-29 NOTE — Assessment & Plan Note (Addendum)
 Patient has a history of persistent asthma, for which she uses albuterol  inhaler as needed, and fluticasone -salmeterol 2 puffs twice daily.  She has not had formal PFTs, and would like to pursue that once she is feeling better from her URI currently. - Referral to Pulmonology for formal PFTs - Continue Albuterol  prn - Continue Advair 2 puffs BID

## 2023-12-29 NOTE — Assessment & Plan Note (Signed)
 Patient reports a recent sexual encounter while she was on vacation.  They did use barrier condoms, though she would like to have STI panel workup.  She reports that since then, she has had a fishy vaginal odor with white discharge.  She states that if she does need to be treated with any metronidazole , she would prefer the pill form and not the vaginal gel.  - f/u HIV, Hep C, RPR - f/u Cervicovaginal swab

## 2023-12-30 ENCOUNTER — Encounter: Payer: Self-pay | Admitting: Student

## 2023-12-30 ENCOUNTER — Other Ambulatory Visit: Payer: Self-pay

## 2023-12-30 ENCOUNTER — Encounter: Attending: Physical Medicine and Rehabilitation | Admitting: Registered Nurse

## 2023-12-30 ENCOUNTER — Other Ambulatory Visit: Payer: Self-pay | Admitting: Student

## 2023-12-30 ENCOUNTER — Encounter: Payer: Self-pay | Admitting: Registered Nurse

## 2023-12-30 ENCOUNTER — Telehealth: Payer: Self-pay | Admitting: Gastroenterology

## 2023-12-30 VITALS — BP 145/82 | HR 82 | Ht 63.0 in | Wt 205.8 lb

## 2023-12-30 DIAGNOSIS — G629 Polyneuropathy, unspecified: Secondary | ICD-10-CM | POA: Insufficient documentation

## 2023-12-30 DIAGNOSIS — K219 Gastro-esophageal reflux disease without esophagitis: Secondary | ICD-10-CM

## 2023-12-30 DIAGNOSIS — Z5181 Encounter for therapeutic drug level monitoring: Secondary | ICD-10-CM | POA: Diagnosis not present

## 2023-12-30 DIAGNOSIS — M170 Bilateral primary osteoarthritis of knee: Secondary | ICD-10-CM

## 2023-12-30 DIAGNOSIS — I10 Essential (primary) hypertension: Secondary | ICD-10-CM

## 2023-12-30 DIAGNOSIS — M47816 Spondylosis without myelopathy or radiculopathy, lumbar region: Secondary | ICD-10-CM | POA: Diagnosis not present

## 2023-12-30 DIAGNOSIS — G894 Chronic pain syndrome: Secondary | ICD-10-CM | POA: Diagnosis not present

## 2023-12-30 DIAGNOSIS — Z79891 Long term (current) use of opiate analgesic: Secondary | ICD-10-CM | POA: Insufficient documentation

## 2023-12-30 MED ORDER — PANTOPRAZOLE SODIUM 40 MG PO TBEC: 40.0000 mg | DELAYED_RELEASE_TABLET | Freq: Every day | ORAL | 0 refills | Status: AC

## 2023-12-30 MED ORDER — OXYCODONE-ACETAMINOPHEN 5-325 MG PO TABS: 1.0000 | ORAL_TABLET | Freq: Every day | ORAL | 0 refills | Status: AC | PRN

## 2023-12-30 MED ORDER — OLMESARTAN MEDOXOMIL 40 MG PO TABS: 40.0000 mg | ORAL_TABLET | Freq: Every day | ORAL | 1 refills | Status: AC

## 2023-12-30 NOTE — Progress Notes (Signed)
Internal Medicine Clinic Attending  I was physically present during the key portions of the resident provided service and participated in the medical decision making of patient's management care. I reviewed pertinent patient test results.  The assessment, diagnosis, and plan were formulated together and I agree with the documentation in the resident's note.  Mercie Eon, MD

## 2023-12-30 NOTE — Telephone Encounter (Signed)
 Inbound call from patient stating that she has a colonoscopy procedure tomorrow with Dr. Leigh and she took some cough syrup but it was red. Patient is requesting a call back. Please advise.

## 2023-12-30 NOTE — Telephone Encounter (Signed)
 Spoke with pt- told her it was ok to proceed.  Asked her why she was taking cough syrup and she states, I just have a little cold  Denies fever or SOB- told to call if she develops any of these symptoms and understanding voiced.  All of questions answered

## 2023-12-30 NOTE — Telephone Encounter (Unsigned)
 Copied from CRM #[id number]. Topic: Clinical - Medication Refill >> Dec 30, 2023  3:55 PM Shamecia H wrote: Medication: pantoprazole  (PROTONIX ) 40 MG tablet,  pregabalin  (LYRICA ) 100 MG capsule, olmesartan  (BENICAR ) 40 MG tablet   Has the patient contacted their pharmacy? Yes (Agent: If no, request that the patient contact the pharmacy for the refill. If patient does not wish to contact the pharmacy document the reason why and proceed with request.) (Agent: If yes, when and what did the pharmacy advise?)  This is the patient's preferred pharmacy:  [facility] [address] [place] - [address] [place] [address] [address] [address] Phone: [phone] Fax: [phone]  Is this the correct pharmacy for this prescription? Yes If no, delete pharmacy and type the correct one.   Has the prescription been filled recently? Yes  Is the patient out of the medication? Yes  Has the patient been seen for an appointment in the last year OR does the patient have an upcoming appointment? Yes  Can we respond through MyChart? Yes  Agent: Please be advised that Rx refills may take up to 3 business days. We ask that you follow-up with your pharmacy.

## 2023-12-30 NOTE — Addendum Note (Signed)
 Addended by: Lovelee Forner L on: 12/30/2023 09:05 AM   Modules accepted: Level of Service
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
## 2023-12-31 ENCOUNTER — Encounter: Payer: Self-pay | Admitting: Gastroenterology

## 2023-12-31 ENCOUNTER — Other Ambulatory Visit: Payer: Self-pay

## 2023-12-31 ENCOUNTER — Ambulatory Visit: Admitting: Gastroenterology

## 2023-12-31 VITALS — BP 155/86 | HR 78 | Temp 97.2°F | Resp 13 | Ht 63.0 in | Wt 205.8 lb

## 2023-12-31 DIAGNOSIS — F32A Depression, unspecified: Secondary | ICD-10-CM | POA: Diagnosis not present

## 2023-12-31 DIAGNOSIS — D122 Benign neoplasm of ascending colon: Secondary | ICD-10-CM

## 2023-12-31 DIAGNOSIS — K635 Polyp of colon: Secondary | ICD-10-CM

## 2023-12-31 DIAGNOSIS — D127 Benign neoplasm of rectosigmoid junction: Secondary | ICD-10-CM

## 2023-12-31 DIAGNOSIS — I10 Essential (primary) hypertension: Secondary | ICD-10-CM | POA: Diagnosis not present

## 2023-12-31 DIAGNOSIS — K6389 Other specified diseases of intestine: Secondary | ICD-10-CM

## 2023-12-31 DIAGNOSIS — D123 Benign neoplasm of transverse colon: Secondary | ICD-10-CM | POA: Diagnosis not present

## 2023-12-31 DIAGNOSIS — K648 Other hemorrhoids: Secondary | ICD-10-CM | POA: Diagnosis not present

## 2023-12-31 DIAGNOSIS — Z860101 Personal history of adenomatous and serrated colon polyps: Secondary | ICD-10-CM

## 2023-12-31 DIAGNOSIS — Z1211 Encounter for screening for malignant neoplasm of colon: Secondary | ICD-10-CM

## 2023-12-31 DIAGNOSIS — K5730 Diverticulosis of large intestine without perforation or abscess without bleeding: Secondary | ICD-10-CM | POA: Diagnosis not present

## 2023-12-31 DIAGNOSIS — Z860100 Personal history of colon polyps, unspecified: Secondary | ICD-10-CM

## 2023-12-31 DIAGNOSIS — J45909 Unspecified asthma, uncomplicated: Secondary | ICD-10-CM | POA: Diagnosis not present

## 2023-12-31 DIAGNOSIS — J439 Emphysema, unspecified: Secondary | ICD-10-CM | POA: Diagnosis not present

## 2023-12-31 MED ORDER — SODIUM CHLORIDE 0.9 % IV SOLN: 500.0000 mL | INTRAVENOUS | Status: DC

## 2023-12-31 MED ORDER — PREGABALIN 100 MG PO CAPS: 100.0000 mg | ORAL_CAPSULE | Freq: Two times a day (BID) | ORAL | 0 refills | Status: DC

## 2023-12-31 MED ORDER — FLEET ENEMA RE ENEM
1.0000 | ENEMA | Freq: Once | RECTAL | Status: AC
  Administered 2023-12-31: 13:00:00 1 via RECTAL

## 2023-12-31 NOTE — Progress Notes (Signed)
 Enema fleets given per order from Dr Leigh after patient reported brown stool after prep. Brown liquid then mushy brown stool after enema. Dr Leigh in to discuss with patient ,will go ahead with procedure.

## 2023-12-31 NOTE — Op Note (Signed)
  [facility] Patient Name: Carrie Franco Procedure Date: 12/31/2023 1:09 PM MRN: [id number] Endoscopist: Carrie P. Leigh , MD, [id number] Age: 58 Referring MD:  Date of Birth: 1965-12-27 Gender: Female Account #: [id number] Procedure:                Colonoscopy Indications:              High risk colon cancer surveillance: Personal                            history of colonic polyps - last exam 04/2020 - 3                            adenomas (of 6 polyps removed) Medicines:                Monitored Anesthesia Care Procedure:                Pre-Anesthesia Assessment:                           - Prior to the procedure, a History and Physical                            was performed, and patient medications and                            allergies were reviewed. The patient's tolerance of                            previous anesthesia was also reviewed. The risks                            and benefits of the procedure and the sedation                            options and risks were discussed with the patient.                            All questions were answered, and informed consent                            was obtained. Prior Anticoagulants: The patient has                            taken no anticoagulant or antiplatelet agents. ASA                            Grade Assessment: III - A patient with severe                            systemic disease. After reviewing the risks and                            benefits, the patient was deemed in satisfactory  condition to undergo the procedure.                           After obtaining informed consent, the colonoscope                            was passed under direct vision. Throughout the                            procedure, the patient's blood pressure, pulse, and                            oxygen saturations were monitored continuously. The                            CF [id number] #[id number] was  introduced through the anus                            and advanced to the the cecum, identified by                            appendiceal orifice and ileocecal valve. The                            colonoscopy was performed without difficulty. The                            patient tolerated the procedure well. The quality                            of the bowel preparation was adequate. The                            ileocecal valve, appendiceal orifice, and rectum                            were photographed. Scope In: 1:54:35 PM Scope Out: 2:22:08 PM Scope Withdrawal Time: 0 hours 20 minutes 21 seconds  Total Procedure Duration: 0 hours 27 minutes 33 seconds  Findings:                 The perianal and digital rectal examinations were                            normal.                           A 5 mm polyp was found in the ascending colon. The                            polyp was sessile. The polyp was removed with a                            cold snare. Resection and retrieval were complete.  Six sessile polyps were found in the transverse                            colon. The polyps were 2 to 4 mm in size. These                            polyps were removed with a cold snare. Resection                            and retrieval were complete.                           Two sessile polyps were found in the recto-sigmoid                            colon. The polyps were 3 to 4 mm in size. These                            polyps were removed with a cold snare. Resection                            and retrieval were complete.                           A diffuse area of melanosis was found in the entire                            colon.                           Multiple diverticula were found in the entire colon.                           Internal hemorrhoids were found during retroflexion.                           A large amount of liquid stool was found in  the                            entire colon, making visualization difficult.                            Lavage of the colon was performed using copious                            amounts of fluid, resulting in clearance with                            adequate visualization.                           The exam was otherwise without abnormality. Complications:            No immediate complications. Estimated blood loss:  Minimal. Estimated Blood Loss:     Estimated blood loss was minimal. Impression:               - One 5 mm polyp in the ascending colon, removed                            with a cold snare. Resected and retrieved.                           - Six 2 to 4 mm polyps in the transverse colon,                            removed with a cold snare. Resected and retrieved.                           - Two 3 to 4 mm polyps at the recto-sigmoid colon,                            removed with a cold snare. Resected and retrieved.                           - Melanosis coli.                           - Diverticulosis in the entire examined colon.                           - Internal hemorrhoids. Recommendation:           - Patient has a contact number available for                            emergencies. The signs and symptoms of potential                            delayed complications were discussed with the                            patient. Return to normal activities tomorrow.                            Written discharge instructions were provided to the                            patient.                           - Resume previous diet.                           - Continue present medications.                           - Await pathology results. Carrie P. Calven Gilkes, MD 12/31/2023 2:30:53 PM This report has been signed electronically.

## 2023-12-31 NOTE — Progress Notes (Signed)
 Sedate, gd SR, tolerated procedure well, VSS, report to RN
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
## 2023-12-31 NOTE — Progress Notes (Signed)
 Called to room to assist during endoscopic procedure.  Patient ID and intended procedure confirmed with present staff. Received instructions for my participation in the procedure from the performing physician.

## 2023-12-31 NOTE — Patient Instructions (Signed)
 Discharge instructions given. Handouts on polyps,Diverticulosis and Hemorrhoids. Resume previous medications. YOU HAD AN ENDOSCOPIC PROCEDURE TODAY AT THE [facility]:   Refer to the procedure report that was given to you for any specific questions about what was found during the examination.  If the procedure report does not answer your questions, please call your gastroenterologist to clarify.  If you requested that your care partner not be given the details of your procedure findings, then the procedure report has been included in a sealed envelope for you to review at your convenience later.  YOU SHOULD EXPECT: Some feelings of bloating in the abdomen. Passage of more gas than usual.  Walking can help get rid of the air that was put into your GI tract during the procedure and reduce the bloating. If you had a lower endoscopy (such as a colonoscopy or flexible sigmoidoscopy) you may notice spotting of blood in your stool or on the toilet paper. If you underwent a bowel prep for your procedure, you may not have a normal bowel movement for a few days.  Please Note:  You might notice some irritation and congestion in your nose or some drainage.  This is from the oxygen used during your procedure.  There is no need for concern and it should clear up in a day or so.  SYMPTOMS TO REPORT IMMEDIATELY:  Following lower endoscopy (colonoscopy or flexible sigmoidoscopy):  Excessive amounts of blood in the stool  Significant tenderness or worsening of abdominal pains  Swelling of the abdomen that is new, acute  Fever of 100F or higher   For urgent or emergent issues, a gastroenterologist can be reached at any hour by calling [phone]. Do not use MyChart messaging for urgent concerns.    DIET:  We do recommend a small meal at first, but then you may proceed to your regular diet.  Drink plenty of fluids but you should avoid alcoholic beverages for 24 hours.  ACTIVITY:  You should  plan to take it easy for the rest of today and you should NOT DRIVE or use heavy machinery until tomorrow (because of the sedation medicines used during the test).    FOLLOW UP: Our staff will call the number listed on your records the next business day following your procedure.  We will call around 7:15- 8:00 am to check on you and address any questions or concerns that you may have regarding the information given to you following your procedure. If we do not reach you, we will leave a message.     If any biopsies were taken you will be contacted by phone or by letter within the next 1-3 weeks.  Please call us at [phone] if you have not heard about the biopsies in 3 weeks.    SIGNATURES/CONFIDENTIALITY: You and/or your care partner have signed paperwork which will be entered into your electronic medical record.  These signatures attest to the fact that that the information above on your After Visit Summary has been reviewed and is understood.  Full responsibility of the confidentiality of this discharge information lies with you and/or your care-partner.

## 2024-01-01 ENCOUNTER — Telehealth: Payer: Self-pay | Admitting: *Deleted

## 2024-01-01 NOTE — Telephone Encounter (Signed)
°  Follow up Call-     12/31/2023   12:47 PM  Call back number  Post procedure Call Back phone  # [phone]  Permission to leave phone message Yes  comments voicemail may be full     Patient questions:  Do you have a fever, pain , or abdominal swelling? No. Pain Score  0 *  Have you tolerated food without any problems? Yes.    Have you been able to return to your normal activities? Yes.    Do you have any questions about your discharge instructions: Diet   No. Medications  No. Follow up visit  No.  Do you have questions or concerns about your Care? No.  Actions: * If pain score is 4 or above: No action needed, pain <4.

## 2024-01-02 ENCOUNTER — Other Ambulatory Visit: Payer: Self-pay | Admitting: Student

## 2024-01-02 DIAGNOSIS — E119 Type 2 diabetes mellitus without complications: Secondary | ICD-10-CM

## 2024-01-03 ENCOUNTER — Ambulatory Visit: Payer: Self-pay | Admitting: Gastroenterology

## 2024-01-03 LAB — SURGICAL PATHOLOGY

## 2024-01-25 ENCOUNTER — Other Ambulatory Visit: Payer: Self-pay | Admitting: Student

## 2024-01-25 DIAGNOSIS — E119 Type 2 diabetes mellitus without complications: Secondary | ICD-10-CM

## 2024-01-28 ENCOUNTER — Telehealth: Payer: Self-pay

## 2024-01-28 NOTE — Telephone Encounter (Signed)
 Kema Troy-PARKER (Key: [id number]) Rx #: [id number] Ozempic  (1 MG/DOSE) 4MG JONELLE pen-injectors Form [employer] Electronic PA Form (2017 NCPDP) Created 17 hours ago Sent to Plan 5 minutes ago Plan Response 5 minutes ago Submit Clinical Questions 4 minutes ago Determination Favorable 1 minute ago Message from Plan Your request has been approved. Authorization Expiration Date: January 14, 2025.

## 2024-01-28 NOTE — Telephone Encounter (Signed)
 Prior Authorization for patient (Ozempic  (1 MG/DOSE) 4MG /3ML pen-injectors) came through on cover my meds was submitted with last office notes and labs awaiting approval or denial.  [id number]

## 2024-01-30 ENCOUNTER — Ambulatory Visit: Payer: Self-pay

## 2024-01-30 NOTE — Telephone Encounter (Signed)
 Pt stated she was dx for BV (on 21/11/25) and she took the medication prescribed. Stated now she sees yeast in vaginal discharge.; Requesting Diflucan . Offered an appt - stated she does not need an appt. Sending to the doctor.

## 2024-01-30 NOTE — Telephone Encounter (Signed)
 FYI Only or Action Required?: Action required by provider: medication refill request. Pt is requesting diflucan  called into [facility] on [address] for yeast infection. Pt would like call back when medication is called in [phone]  Patient was last seen in primary care on 12/26/2023 by Leontine Lapine, MD.  Called Nurse Triage reporting Vaginal Discharge.Pt states yeast infection.  Symptoms began a week ago.  Interventions attempted: Nothing.  Symptoms are: gradually worsening.  Triage Disposition: See PCP When Office is Open (Within 3 Days)  Patient/caregiver understands and will follow disposition?: No, refuses disposition - Pt would like medication called in for vaginal yeast infection                  Copied from CRM #[id number]. Topic: Clinical - Red Word Triage >> Jan 30, 2024 11:31 AM Miquel SAILOR wrote: Red Word that prompted transfer to Nurse Triage: PT has Severe Bacterial infection/for 1 week PT does not want to go in to detail of what she is feeling personal. Requesting nurse. Reason for Disposition  [1] Symptoms of a yeast infection (i.e., itchy, white discharge, not bad smelling) AND [2] not improved > 3 days following Care Advice  Answer Assessment - Initial Assessment Questions 1. DISCHARGE: Describe the discharge. (e.g., white, yellow, green, gray, foamy, cottage cheese-like)     yellowish 2. ODOR: Is there a bad odor?     No  3. ONSET: When did the discharge begin?     1 week 4. RASH: Is there a rash in the genital area? If Yes, ask: Describe it. (e.g., redness, blisters, sores, bumps)     no 5. ABDOMEN PAIN: Are you having any abdomen pain? If Yes, ask: What does it feel like?  (e.g., crampy, dull, intermittent, constant)      no 6. ABDOMEN PAIN SEVERITY: If present, ask: How bad is it? (e.g., Scale 1-10; mild, moderate, or severe)     no 7. CAUSE: What do you think is causing the discharge? Have you had the same problem before?  What happened then?     yeast 8. OTHER SYMPTOMS: Do you have any other symptoms? (e.g., fever, itching, urination pain, vaginal bleeding, vaginal foreign body)     itching  Protocols used: Vaginal Discharge-A-AH

## 2024-01-31 ENCOUNTER — Encounter: Payer: Self-pay | Admitting: *Deleted

## 2024-01-31 NOTE — Telephone Encounter (Signed)
 My chart message sent to pt to schedule an appt; and to call the office on Monday since the office is closed.

## 2024-02-11 ENCOUNTER — Other Ambulatory Visit: Payer: Self-pay | Admitting: Student

## 2024-02-11 DIAGNOSIS — J309 Allergic rhinitis, unspecified: Secondary | ICD-10-CM

## 2024-02-13 ENCOUNTER — Ambulatory Visit: Payer: Self-pay

## 2024-02-13 ENCOUNTER — Telehealth: Payer: Self-pay | Admitting: *Deleted

## 2024-02-13 NOTE — Telephone Encounter (Signed)
 Lyrica  was refilled 1/27 and sent to [facility]; pt was called, no answer-left message on pt's vm.a

## 2024-02-13 NOTE — Telephone Encounter (Signed)
 Pt has an appt tomorrow 1/30 with Dr Kandis.

## 2024-02-13 NOTE — Telephone Encounter (Signed)
 Copied from CRM #[id number]. Topic: Clinical - Medication Refill >> Feb 13, 2024 10:18 AM Susanna ORN wrote: Medication: pregabalin  (LYRICA ) 100 MG capsule  Has the patient contacted their pharmacy? Yes, states they haven't heard back from provider. (Agent: If no, request that the patient contact the pharmacy for the refill. If patient does not wish to contact the pharmacy document the reason why and proceed with request.) (Agent: If yes, when and what did the pharmacy advise?)  This is the patient's preferred pharmacy:  [facility] [address], [place] - [address] [place] [place] [address] Phone: [phone] Fax: [phone]   Is this the correct pharmacy for this prescription? Yes If no, delete pharmacy and type the correct one.   Has the prescription been filled recently? Yes  Is the patient out of the medication? Yes  Has the patient been seen for an appointment in the last year OR does the patient have an upcoming appointment? Yes  Can we respond through MyChart? Yes  Agent: Please be advised that Rx refills may take up to 3 business days. We ask that you follow-up with your pharmacy.

## 2024-02-13 NOTE — Telephone Encounter (Signed)
 FYI Only or Action Required?: FYI only for provider: appointment scheduled on 1/30.  Patient was last seen in primary care on 12/26/2023 by Leontine Lapine, MD.  Called Nurse Triage reporting Hair/Scalp Problem.  Symptoms began x 2 weeks.  Interventions attempted: Nothing.  Symptoms are: gradually worsening.  Triage Disposition: See PCP Within 2 Weeks  Patient/caregiver understands and will follow disposition?: Yes       Copied from CRM [id number]. Topic: Clinical - Medication Question >> Feb 13, 2024 10:11 AM Susanna ORN wrote: Reason for CRM: Patient states her hair has started coming out in wads for about 2 weeks. States the only thing she's been using is the Ozempic  shot. Wants to speak with someone about it & possibly try to get a referral to see a dermatologist. Reason for Disposition  [1] Patch of hair loss AND [2] cause not known  Answer Assessment - Initial Assessment Questions 1. LOCATION: Where is the hair loss? (e.g., all of scalp, parts of scalp, back of head or neck)     Back of head  2. DESCRIPTION: What does it look like? (e.g., thinning of hair, balding, patches of hair missing)     Thinning   3. ONSET: When did the hair loss begin? (e.g., sudden or gradual onset; days, weeks, months or years ago)     2 Weeks   4. OTHER SYMPTOMS: What does the scalp look like where the hair is missing? (e.g., normal, redness, crusts, scarring)     Scalp itching   5. OTHER FACTORS: Have you had any of the following recently: childbirth, severe illness or injury, major surgery, major weight loss, cancer chemo, tight hair braids, serious stress?     No   6. CAUSE: What do you think is causing the hair loss?     Ozempic , but is unsure.     Patient called in to triage with complaints of hair loss.  This has been ongoing for 2 weeks.  The patient stated her  hair is coming out in clumps.    Appointment scheduled for further evaluation; Patient agrees with the  plan of care, and will reach out if symptoms worsen or persist.  Protocols used: Hair Loss-A-AH

## 2024-02-14 ENCOUNTER — Other Ambulatory Visit (HOSPITAL_COMMUNITY): Admission: RE | Admit: 2024-02-14 | Discharge: 2024-02-14 | Disposition: A | Source: Ambulatory Visit

## 2024-02-14 ENCOUNTER — Ambulatory Visit: Payer: Self-pay | Admitting: Student

## 2024-02-14 VITALS — BP 137/69 | HR 95 | Temp 98.7°F | Ht 63.0 in | Wt 200.8 lb

## 2024-02-14 DIAGNOSIS — E119 Type 2 diabetes mellitus without complications: Secondary | ICD-10-CM | POA: Diagnosis not present

## 2024-02-14 DIAGNOSIS — L292 Pruritus vulvae: Secondary | ICD-10-CM

## 2024-02-14 DIAGNOSIS — Z7985 Long-term (current) use of injectable non-insulin antidiabetic drugs: Secondary | ICD-10-CM

## 2024-02-14 DIAGNOSIS — N898 Other specified noninflammatory disorders of vagina: Secondary | ICD-10-CM | POA: Insufficient documentation

## 2024-02-14 DIAGNOSIS — R0602 Shortness of breath: Secondary | ICD-10-CM

## 2024-02-14 DIAGNOSIS — L659 Nonscarring hair loss, unspecified: Secondary | ICD-10-CM | POA: Diagnosis not present

## 2024-02-14 DIAGNOSIS — F331 Major depressive disorder, recurrent, moderate: Secondary | ICD-10-CM

## 2024-02-14 DIAGNOSIS — F32A Depression, unspecified: Secondary | ICD-10-CM

## 2024-02-14 LAB — POCT GLYCOSYLATED HEMOGLOBIN (HGB A1C): HbA1c, POC (controlled diabetic range): 5.6 % (ref 0.0–7.0)

## 2024-02-14 LAB — CERVICOVAGINAL ANCILLARY ONLY
Bacterial Vaginitis (gardnerella): POSITIVE — AB
Candida Glabrata: POSITIVE — AB
Candida Vaginitis: NEGATIVE
Comment: NEGATIVE
Comment: NEGATIVE
Comment: NEGATIVE

## 2024-02-14 LAB — GLUCOSE, CAPILLARY: Glucose-Capillary: 122 mg/dL — ABNORMAL HIGH (ref 70–99)

## 2024-02-14 MED ORDER — FLUOXETINE HCL 40 MG PO CAPS: 40.0000 mg | ORAL_CAPSULE | Freq: Every day | ORAL | 3 refills | Status: AC

## 2024-02-14 MED ORDER — FLUTICASONE-SALMETEROL 115-21 MCG/ACT IN AERO: 2.0000 | INHALATION_SPRAY | Freq: Two times a day (BID) | RESPIRATORY_TRACT | 1 refills | Status: DC

## 2024-02-14 MED ORDER — FLUCONAZOLE 150 MG PO TABS: ORAL_TABLET | ORAL | 0 refills | Status: AC

## 2024-02-14 NOTE — Assessment & Plan Note (Signed)
 Patient reports concerns of yeast infection. She reports vaginal itching that presented in the past when she had a yeast infection.  Plan: -Self swab ordered -Diflucan  sent in

## 2024-02-14 NOTE — Patient Instructions (Addendum)
 Thank you, Carrie Franco for allowing us  to provide your care today. Today we discussed hair loss. Your hair loss is likely caused from pressure and use of wigs. Please avoid wigs and try not to place pressure on that portion of your head when you are sitting watching TV.  You may use an over the counter vitamin as well.  If these changes do not help, let us  know and we cna send you to dermatology!  Your diabetes is well controlled! Keep up the great work.  Continue to see PMR for the back pain   I have ordered the following labs for you:   Lab Orders         Glucose, capillary         POC Hbg A1C       I have ordered the following medication/changed the following medications:   Stop the following medications: Medications Discontinued During This Encounter  Medication Reason   fluticasone -salmeterol (ADVAIR HFA) 115-21 MCG/ACT inhaler Reorder   fluticasone -salmeterol (ADVAIR HFA) 115-21 MCG/ACT inhaler    FLUoxetine  (PROZAC ) 40 MG capsule Reorder     Start the following medications: Meds ordered this encounter  Medications   DISCONTD: fluticasone -salmeterol (ADVAIR HFA) 115-21 MCG/ACT inhaler    Sig: Inhale 2 puffs into the lungs 2 (two) times daily.    Dispense:  12 g    Refill:  1   FLUoxetine  (PROZAC ) 40 MG capsule    Sig: Take 1 capsule (40 mg total) by mouth daily.    Dispense:  90 capsule    Refill:  3     Follow up: 3 months or sooner if needed   Remember:   Should you have any questions or concerns please call the internal medicine clinic at [phone].     Please note that our late policy has changed.  If you are more than 15 minutes late to your appointment, you may be asked to reschedule your appointment.  Dr. Kandis, D.O. [facility]

## 2024-02-14 NOTE — Progress Notes (Signed)
 "  Established Patient Office Visit  Subjective   Patient ID: Carrie Franco, female    DOB: September 20, 1965  Age: 59 y.o. MRN: [id number]  Chief Complaint  Patient presents with   Medication Refill   Diabetes   Back Pain   Alopecia    Carrie Franco is a 59 y.o. who presents to the clinic for hair loss, diabetes, and ? Yeast infection. Note that she follows with PMR for back pain and does not want to further address the back pain this visit. Please see problem based assessment and plan for additional details.   Patient Active Problem List   Diagnosis Date Noted   Hair loss 02/14/2024   Genital herpes 12/29/2023   Encounter for screening examination for sexually transmitted infection 12/29/2023   Lightheaded 11/11/2023   Vulvovaginal pruritus 09/24/2023   Abnormal x-ray of humerus 09/12/2023   Insomnia 09/12/2023   Left foot pain 08/05/2023   BMI 37.0-37.9, adult with comorbidity 02/21/2023   Type 2 diabetes mellitus (HCC) 09/20/2022   Upper respiratory infection 09/07/2020   Cervical radiculopathy 08/12/2020   Aortic atherosclerosis 02/17/2020   Emphysema of lung (HCC) 02/17/2020   Radiculopathy of lumbosacral region 06/24/2017   Depression 01/02/2017   Osteoarthritis 12/05/2015   Eczema 04/01/2015   Hyperlipidemia associated with type 2 diabetes mellitus (HCC) 07/31/2013   GERD (gastroesophageal reflux disease) 03/19/2013   Persistent asthma 02/23/2010   Healthcare maintenance 02/23/2010   Essential hypertension 01/13/2007   Allergic sinusitis 01/02/2006      Objective:     BP 137/69 (BP Location: Right Arm, Patient Position: Sitting)   Pulse 95   Temp 98.7 F (37.1 C) (Oral)   Ht 5' 3 (1.6 m)   Wt 200 lb 12.8 oz (91.1 kg)   LMP 11/22/2012   SpO2 100%   BMI 35.57 kg/m  BP Readings from Last 3 Encounters:  02/14/24 137/69  12/31/23 (!) 155/86  12/30/23 (!) 145/82   Wt Readings from Last 3 Encounters:  02/14/24 200 lb 12.8 oz (91.1 kg)  12/31/23 205 lb 12.8  oz (93.4 kg)  12/30/23 205 lb 12.8 oz (93.4 kg)      Physical Exam Vitals reviewed.  Constitutional:      General: She is not in acute distress.    Appearance: She is not toxic-appearing.  HENT:     Head:     Comments: Diffuse alopecia without scarring along the posterior portion of the skull (occiput and partial).   No hair loss appreciated elsewhere along the scalp Cardiovascular:     Rate and Rhythm: Normal rate and regular rhythm.     Heart sounds: No murmur heard. Pulmonary:     Effort: Pulmonary effort is normal.     Breath sounds: Normal breath sounds.  Neurological:     Mental Status: She is alert.  Psychiatric:        Mood and Affect: Mood and affect normal.    Results for orders placed or performed in visit on 02/14/24  Glucose, capillary  Result Value Ref Range   Glucose-Capillary 122 (H) 70 - 99 mg/dL  POC Hbg J8R  Result Value Ref Range   Hemoglobin A1C     HbA1c POC (<> result, manual entry)     HbA1c, POC (prediabetic range)     HbA1c, POC (controlled diabetic range) 5.6 0.0 - 7.0 %    Last hemoglobin A1c Lab Results  Component Value Date   HGBA1C 5.6 02/14/2024      The  10-year ASCVD risk score (Arnett DK, et al., 2019) is: 15.2%    Assessment & Plan:   Problem List Items Addressed This Visit       Endocrine   Type 2 diabetes mellitus (HCC) - Primary   Patient presents with a history of T2DM with an A1c today of 5.6.  They are on a regimen of Ozempic  1mg  weekly.   Plan: -Continue regimen of:ozempic  1 mg weekly -A1c today      Relevant Orders   POC Hbg A1C (Completed)     Genitourinary   Vulvovaginal pruritus   Patient reports concerns of yeast infection. She reports vaginal itching that presented in the past when she had a yeast infection.  Plan: -Self swab ordered -Diflucan  sent in         Other   Hair loss   Patient reports 2-week history of hair loss.  Patient reports that she has been losing hairs and clumps.  She  denies wearing any tight hair bands or braids but does report wearing wigs.  She denies hair loss anywhere else in her body face.  She denies any new mental or physical stressors or pulling her hair.  She denies taking any vitamins.  She does report that she sits in bed all night watching movies and where she has hair loss is where she places pressure on her head when watching movies.   On exam: she has non-scarring alopecia, there is patchy hair loss along the posterior occiput.   Plan: -Avoid wigs -Avoid pressure on portion of scalp where she has hair loss -OTC multivitamin -Dermatology referral if above interventions are unsuccessful       Depression   Relevant Medications   FLUoxetine  (PROZAC ) 40 MG capsule   Other Visit Diagnoses       Shortness of breath         Vaginal itching       Relevant Orders   Cervicovaginal ancillary only       Return in about 3 months (around 05/14/2024) for chrinic conditions .    Damien Lease, DO  "

## 2024-02-14 NOTE — Progress Notes (Signed)
 Internal Medicine Clinic Attending  Case discussed with the resident at the time of the visit.  We reviewed the resident's history and exam and pertinent patient test results.  I agree with the assessment, diagnosis, and plan of care documented in the resident's note.

## 2024-02-14 NOTE — Assessment & Plan Note (Signed)
 Patient presents with a history of T2DM with an A1c today of 5.6.  They are on a regimen of Ozempic  1mg  weekly.   Plan: -Continue regimen of:ozempic  1 mg weekly -A1c today

## 2024-02-14 NOTE — Assessment & Plan Note (Addendum)
 Patient reports 2-week history of hair loss.  Patient reports that she has been losing hairs in clumps.  She denies wearing any tight hair bands or braids but does report wearing wigs.  She denies hair loss anywhere else. She denies any new mental or physical stressors or pulling her hair.  She denies taking any vitamins.  She does report that she sits in bed all night watching movies and where she has hair loss is where she places pressure on her head when watching movies.   On exam: she has non-scarring alopecia, there is patchy hair loss along the posterior occiput.   Plan: -Avoid wigs -Avoid pressure on portion of scalp where she has hair loss -OTC multivitamin -Dermatology referral if above interventions are unsuccessful

## 2024-02-17 ENCOUNTER — Ambulatory Visit: Payer: Self-pay | Admitting: Student

## 2024-02-17 MED ORDER — METRONIDAZOLE 500 MG PO TABS: 500.0000 mg | ORAL_TABLET | Freq: Two times a day (BID) | ORAL | 0 refills | Status: AC

## 2024-02-17 MED ORDER — BORIC ACID 600 MG VA SUPP: 600.0000 mg | Freq: Every day | VAGINAL | 0 refills | Status: AC

## 2024-02-19 ENCOUNTER — Other Ambulatory Visit: Payer: Self-pay

## 2024-02-19 DIAGNOSIS — E119 Type 2 diabetes mellitus without complications: Secondary | ICD-10-CM

## 2024-02-19 MED ORDER — OZEMPIC (1 MG/DOSE) 4 MG/3ML ~~LOC~~ SOPN: 1.0000 mg | PEN_INJECTOR | SUBCUTANEOUS | 0 refills | Status: AC
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
## 2024-02-24 ENCOUNTER — Encounter: Admitting: Registered Nurse

## 2024-04-29 ENCOUNTER — Ambulatory Visit
